# Patient Record
Sex: Male | Born: 1965
Health system: Southern US, Community
[De-identification: ages and names within clinical notes are randomized; demographics above are authoritative.]

## PROBLEM LIST (undated history)

## (undated) DIAGNOSIS — D509 Iron deficiency anemia, unspecified: Secondary | ICD-10-CM

## (undated) DIAGNOSIS — T7840XA Allergy, unspecified, initial encounter: Secondary | ICD-10-CM

## (undated) DIAGNOSIS — G4730 Sleep apnea, unspecified: Secondary | ICD-10-CM

## (undated) DIAGNOSIS — Z5189 Encounter for other specified aftercare: Secondary | ICD-10-CM

## (undated) DIAGNOSIS — R0600 Dyspnea, unspecified: Secondary | ICD-10-CM

## (undated) DIAGNOSIS — R0609 Other forms of dyspnea: Secondary | ICD-10-CM

## (undated) DIAGNOSIS — F329 Major depressive disorder, single episode, unspecified: Secondary | ICD-10-CM

## (undated) DIAGNOSIS — F988 Other specified behavioral and emotional disorders with onset usually occurring in childhood and adolescence: Secondary | ICD-10-CM

## (undated) DIAGNOSIS — K449 Diaphragmatic hernia without obstruction or gangrene: Secondary | ICD-10-CM

## (undated) DIAGNOSIS — F32A Depression, unspecified: Secondary | ICD-10-CM

## (undated) DIAGNOSIS — F419 Anxiety disorder, unspecified: Secondary | ICD-10-CM

## (undated) DIAGNOSIS — IMO0001 Reserved for inherently not codable concepts without codable children: Secondary | ICD-10-CM

## (undated) DIAGNOSIS — K219 Gastro-esophageal reflux disease without esophagitis: Secondary | ICD-10-CM

## (undated) DIAGNOSIS — G47419 Narcolepsy without cataplexy: Secondary | ICD-10-CM

## (undated) HISTORY — DX: Gastro-esophageal reflux disease without esophagitis: K21.9

## (undated) HISTORY — DX: Other forms of dyspnea: R06.09

## (undated) HISTORY — DX: Depression, unspecified: F32.A

## (undated) HISTORY — DX: Encounter for other specified aftercare: Z51.89

## (undated) HISTORY — DX: Anxiety disorder, unspecified: F41.9

## (undated) HISTORY — DX: Major depressive disorder, single episode, unspecified: F32.9

## (undated) HISTORY — PX: COLONOSCOPY: SHX174

## (undated) HISTORY — DX: Sleep apnea, unspecified: G47.30

## (undated) HISTORY — DX: Diaphragmatic hernia without obstruction or gangrene: K44.9

## (undated) HISTORY — DX: Allergy, unspecified, initial encounter: T78.40XA

## (undated) HISTORY — PX: UPPER GASTROINTESTINAL ENDOSCOPY: SHX188

## (undated) HISTORY — DX: Iron deficiency anemia, unspecified: D50.9

## (undated) HISTORY — DX: Other specified behavioral and emotional disorders with onset usually occurring in childhood and adolescence: F98.8

## (undated) HISTORY — DX: Narcolepsy without cataplexy: G47.419

## (undated) HISTORY — DX: Dyspnea, unspecified: R06.00

---

## 2008-03-08 ENCOUNTER — Ambulatory Visit: Payer: Self-pay | Admitting: Psychiatry

## 2008-03-08 ENCOUNTER — Inpatient Hospital Stay (HOSPITAL_COMMUNITY): Admission: AD | Admit: 2008-03-08 | Discharge: 2008-03-20 | Payer: Self-pay | Admitting: Psychiatry

## 2008-04-12 ENCOUNTER — Emergency Department (HOSPITAL_COMMUNITY): Admission: EM | Admit: 2008-04-12 | Discharge: 2008-04-13 | Payer: Self-pay | Admitting: Emergency Medicine

## 2008-06-26 ENCOUNTER — Ambulatory Visit: Payer: Self-pay | Admitting: *Deleted

## 2008-06-26 ENCOUNTER — Inpatient Hospital Stay (HOSPITAL_COMMUNITY): Admission: RE | Admit: 2008-06-26 | Discharge: 2008-07-13 | Payer: Self-pay | Admitting: *Deleted

## 2009-06-19 ENCOUNTER — Ambulatory Visit: Payer: Self-pay | Admitting: Internal Medicine

## 2009-06-24 ENCOUNTER — Ambulatory Visit: Payer: Self-pay | Admitting: *Deleted

## 2010-12-27 ENCOUNTER — Emergency Department (HOSPITAL_COMMUNITY): Payer: Self-pay

## 2010-12-27 ENCOUNTER — Inpatient Hospital Stay (HOSPITAL_COMMUNITY)
Admission: EM | Admit: 2010-12-27 | Discharge: 2010-12-28 | DRG: 812 | Disposition: A | Discharge status: Home / Self Care | Payer: Self-pay | Attending: Internal Medicine | Admitting: Internal Medicine

## 2010-12-27 DIAGNOSIS — D509 Iron deficiency anemia, unspecified: Principal | ICD-10-CM | POA: Diagnosis present

## 2010-12-27 DIAGNOSIS — R0609 Other forms of dyspnea: Secondary | ICD-10-CM | POA: Diagnosis present

## 2010-12-27 DIAGNOSIS — F329 Major depressive disorder, single episode, unspecified: Secondary | ICD-10-CM | POA: Diagnosis present

## 2010-12-27 DIAGNOSIS — R5381 Other malaise: Secondary | ICD-10-CM | POA: Diagnosis present

## 2010-12-27 DIAGNOSIS — R0989 Other specified symptoms and signs involving the circulatory and respiratory systems: Secondary | ICD-10-CM | POA: Diagnosis present

## 2010-12-27 DIAGNOSIS — F3289 Other specified depressive episodes: Secondary | ICD-10-CM | POA: Diagnosis present

## 2010-12-27 LAB — COMPREHENSIVE METABOLIC PANEL
ALT: 16 U/L (ref 0–53)
AST: 18 U/L (ref 0–37)
Alkaline Phosphatase: 67 U/L (ref 39–117)
CO2: 24 mEq/L (ref 19–32)
Calcium: 8.8 mg/dL (ref 8.4–10.5)
GFR calc Af Amer: >60 mL/min (ref >60)
GFR calc non Af Amer: >60 mL/min (ref >60)
Glucose, Bld: 96 mg/dL (ref 70–99)
Potassium: 4.6 mEq/L (ref 3.5–5.1)
Sodium: 135 mEq/L (ref 135–145)
Total Protein: 6.9 g/dL (ref 6.0–8.3)

## 2010-12-27 LAB — CBC
HCT: 20.5 % — ABNORMAL LOW (ref 39.0–52.0)
MCV: 61.6 fL — ABNORMAL LOW (ref 78.0–100.0)
RBC: 3.33 MIL/uL — ABNORMAL LOW (ref 4.22–5.81)
RDW: 17.2 % — ABNORMAL HIGH (ref 11.5–15.5)
WBC: 7.7 10*3/uL (ref 4.0–10.5)

## 2010-12-27 LAB — RETICULOCYTES
RBC.: 3.1 MIL/uL — ABNORMAL LOW (ref 4.22–5.81)
Retic Count, Absolute: 55.8 10*3/uL (ref 19.0–186.0)

## 2010-12-27 LAB — VITAMIN B12: Vitamin B-12: 464 pg/mL (ref 211–911)

## 2010-12-27 LAB — ABO/RH: ABO/RH(D): A POS

## 2010-12-28 ENCOUNTER — Inpatient Hospital Stay (HOSPITAL_COMMUNITY): Payer: Self-pay

## 2010-12-28 LAB — TYPE AND SCREEN

## 2010-12-28 LAB — CBC
MCV: 65.9 fL — ABNORMAL LOW (ref 78.0–100.0)
Platelets: 337 10*3/uL (ref 150–400)
RDW: 20.8 % — ABNORMAL HIGH (ref 11.5–15.5)
WBC: 7.1 10*3/uL (ref 4.0–10.5)

## 2010-12-28 LAB — IRON AND TIBC
Iron: <10 ug/dL — ABNORMAL LOW (ref 42–135)
UIBC: 406 ug/dL

## 2010-12-29 ENCOUNTER — Encounter: Payer: Self-pay | Admitting: Internal Medicine

## 2010-12-29 NOTE — Telephone Encounter (Signed)
I have contacted the home phone for this patient and it is a group home, and the patient is no longer a resident.  I have left a voicemail on the emergency contact number to please contact us.

## 2010-12-29 NOTE — Telephone Encounter (Signed)
Message copied by Darcey Nora on Mon Dec 29, 2010 11:27 AM ------      Message from: Stan Head      Created: Sun Dec 28, 2010 12:30 PM      Regarding: needs colonoscopy       I spoke to hospitalist      Iron-deficiency anemia, transfused and ok      No symptoms      Please arrange for colonoscopy direct with me re: [phone]

## 2010-12-29 NOTE — Telephone Encounter (Signed)
Message copied by Darcey Nora on Mon Dec 29, 2010 10:57 AM ------      Message from: Stan Head      Created: Sun Dec 28, 2010 12:30 PM      Regarding: needs colonoscopy       I spoke to hospitalist      Iron-deficiency anemia, transfused and ok      No symptoms      Please arrange for colonoscopy direct with me re: [phone]

## 2010-12-29 NOTE — Telephone Encounter (Signed)
I have attempted to reach the patient at the listed phone number- this is a group home and they state he is no longer a resident and have no forwarding information.  I have left a voicemail with the emergency contact number.

## 2010-12-29 NOTE — Telephone Encounter (Signed)
This encounter was created in error - please disregard.

## 2010-12-30 NOTE — Discharge Summary (Signed)
[name]               ACCOUNT NO.:  [id number]  MEDICAL RECORD NO.:  [id number]           PATIENT TYPE:  I  LOCATION:  5529                         FACILITY:  [facility]  PHYSICIAN:  Thad Ranger, MD       DATE OF BIRTH:  [birth year]  DATE OF ADMISSION:  12/27/2010 DATE OF DISCHARGE:  12/28/2010                        DISCHARGE SUMMARY - REFERRING   PRIMARY CARE PHYSICIAN:  Beverely Risen, MD  DISCHARGE DIAGNOSES: 1. Anemia, iron-deficiency. 2. Exertional dyspnea secondary to anemia. 3. History of depression. 4. Constipation.  DISCHARGE MEDICATIONS: 1. Ferrous sulfate 325 mg p.o. t.i.d. 2. Wellbutrin 100 mg 2 caps p.o. b.i.d. 3. Zoloft 50 mg p.o. b.i.d. 4. Senna/docusate 2 tablets p.o. at bedtime.  BRIEF HISTORY OF PRESENT ILLNESS:  [name] is a 45 year old male with no significant past medical history, who presented with a gradual onset of fatigue and dyspnea.  The patient stated that he had gotten worse over the past several months, so he went to the free clinic, and he was seen for initial evaluation and labs were drawn.  Hemoglobin was 6.3 with an undetectable ferritin level, and he was referred to the emergency room for further workup.  For details, please refer to the admission note dictated by Dr. Orvis Brill on 12/27/2010.  The patient otherwise denied any obvious blood loss, any hematemesis, hematuria, melena, or any hematochezia.  He stated that his bowel movements were normal.  He did not take any NSAIDs or any over-the-counter medications. No recent surgery, trauma, or any antibiotics use.  RADIOLOGICAL DATA:  Chest x-ray two-view April 21; mild right peribronchial thickening without focal airspace disease.  Abdominal x- ray April 21; no acute findings, moderate stool burden.  CT of abdomen and pelvis without contrast April 22 showed no evidence of retroperitoneal hemorrhage and right nephrolithiasis.  PERTINENT LABORATORY DIAGNOSTIC DATA:  CBC at the time of  admission had shown hemoglobin of 5.9 with hematocrit of 20.5.  Stool occult blood was negative.  CMP was essentially normal and unremarkable.  Reti count 1.8.  Smear showed ovalocyte, LDH 180, haptoglobin 198.  Anemia panel showed iron less than 10, ferritin 1, folate 7.1, B12 of 464.  CBC at the time of discharge had shown white count of 7.1, hemoglobin 7.7, hematocrit 25.7, platelets 337.  BRIEF HOSPITALIZATION COURSE:  [name] is a 45 year old male who presented with chronic anemia.  Anemia, likely iron-deficiency anemia.  The patient was admitted to the hospital and transfused 2 units of packed RBCs.  Fecal occult blood test was negative.  The patient had extensive workup which ruled out any hemolysis.  He has a significant iron-deficiency anemia with iron of less than 10 and ferritin of 1.  He has not had any screening colonoscopy so far.  He denies any hematochezia or melena.  I discussed in detail with Dr. Stan Head.  The patient does need a screening colonoscopy as well as capsule endoscopy if the colonoscopy is negative. The patient did not have any symptoms of IBD or celiac disease or any diarrhea.  I also spoke with hematology, Dr. Lucinda Dell over the phone, who recommended checking a  CT of abdomen and pelvis to rule out occult retroperitoneal hemorrhage.  Stat CT abdomen was done, which was negative for any retroperitoneal hemorrhage.  GI office will call the patient to schedule the colonoscopy next week.  The patient was discharged home on iron replacement.  At the time of the discharge, the patient was ambulating without any difficulty, and had no dyspnea on ambulation in the hallways.  DISCHARGE FOLLOWUP:  Dr. Stan Head next week.  The patient will be called with an appointment for colonoscopy as well.     Thad Ranger, MD     RR/MEDQ  D:  12/28/2010  T:  12/28/2010  Job:  [id number]  cc:   Iva Boop, MD,FACG Beverely Risen, MD  Electronically Signed  by Andres Labrum RAI  on 12/30/2010 05:36:15 PM

## 2011-01-01 NOTE — Telephone Encounter (Signed)
Unable to reach the patient or the emergency contact to schedule colon.

## 2011-01-16 NOTE — H&P (Signed)
[name], FOILES NO.:  [id number]  MEDICAL RECORD NO.:  [id number]           PATIENT TYPE:  E  LOCATION:  [facility]                         FACILITY:  [facility]  PHYSICIAN:  Seward Speck, MD        DATE OF BIRTH:  1966/02/07  DATE OF ADMISSION:  12/27/2010 DATE OF DISCHARGE:                             HISTORY & PHYSICAL   PRIMARY CARE PHYSICIAN:  The patient recently established care at the [facility], physician's name is Dr. Vickii Penna.  CHIEF COMPLAINT:  Fatigue and dyspnea.  HISTORY OF PRESENT ILLNESS:  The patient is a 45 year old male with no significant past medical history who presents with the above complaint the patient was in his usual state of health until several months ago, when he developed the gradual onset of fatigue and dyspnea.  This gradually has gotten worse over the past several months, because of this he went to the free clinic recently where he was seen for initial evaluation and had labs drawn.  These labs showed a hemoglobin of 6.3 with an almost undetectable ferritin level, and he was referred to ER for further evaluation.  Talking to the patient, he has no significant prior past medical history.  He has no history of any toxic exposures specifically no history of any chemotherapy or radiation exposure of any type of any type.  He does not have any chronic gastrointestinal, cardiovascular, or renal diseases.  He does have history of asthma which is very mild, it causes intermittent dyspnea and a dry cough but he states his recent dyspnea which has gotten gradually worse over the past several months is not does not feel like his asthma, dyspnea.  He is currently taking classes at the [employer] and states that he has noticed walking to his class, he has gotten very short of breath having to stop after walking several 1000 feet.  The patient states he eats a diet including a normal amount of red meat, he does not adhere to any vegan diet and  he has not had any dietary changes of recent.  He does not have any obvious blood loss that he can tell, specifically no cough, no hemoptysis.  No hematemesis, no hematuria, melena, no hematochezia.  His bowel movements have been normal.  He does not take any NSAIDs or other over-the-counter nonsteroidal medicines, he has never had any stomach ulcers or other gastrointestinal bleeding sources diagnosed in the past. He has not had any recent surgery or trauma.  PAST MEDICAL HISTORY: 1. Depression. 2. Asthma, mild intermittent.  PAST SURGICAL HISTORY:  None.  FAMILY HISTORY:  The patient does not know his family history, as he is not close with his family.  SOCIAL HISTORY:  The patient currently is taking classes at [employer].  He denies tobacco or alcohol use.  He is currently not working because he is taking classes.  ALLERGIES:  No known drug allergies.  MEDICATIONS: 1. Zoloft 100 mg daily. 2. Wellbutrin 200 mg twice daily.  No other over-the-counter supplements or herbal medications.  REVIEW OF SYSTEMS:  A 12-point review of systems was reviewed with the  patient and as stated in HPI, otherwise negative.  The patient additionally states that he feels like he might have gained weight recently and had a few night sweats but these do not sound that significant.  PHYSICAL EXAMINATION:  VITAL SIGNS:  Afebrile, blood pressure 118/76, pulse 90, respiratory rate 16, oxygen saturation 98% on room air. GENERAL/CONSTITUTIONAL:  Overweight male in no acute distress. HEENT:  Moist mucous membranes.  Sclerae are pale. NECK:  Supple.  No JVD.  No lymphadenopathy. CARDIOVASCULAR:  Regular rate and rhythm.  No murmurs. No S3, no S4. PULMONARY:  The patient has occasional expiratory wheezes with moderate air movement. ABDOMEN:  Soft, nontender, and nondistended. EXTREMITIES:  Warm and well-perfused.  No cyanosis, clubbing, or edema x4.  Normal pulses throughout. SKIN:  No rash. PSYCH:   Appropriate. NEUROLOGIC:  Alert and oriented x3. RECTAL:  The ER physician performed a rectal exam which per her description, there was no gross blood and guaiac negative.  DIAGNOSTIC STUDIES:  I have labs done from December 16, 2010, by his primary care physician which I will summarize as follows:  These were acquired on December 16, 2010.  Hemoglobin 6.3, hematocrit 22.0, MCV, MCH, MCHC although RDW high, platelet count at 423, white blood cell differential normal.  BMP normal.  Thyroxine normal, vitamin B12 normal, folic acid normal.  LFT panel, hepatic panel normal, serum ferritin 2 with a normal range and this lab being 30 to 400.  EKG today shows normal sinus rhythm with no abnormalities.  Chest x-ray is grossly normal.  LABORATORY DATA:  Labs today in the ER shows hemoglobin of 5.9 MCV, MCH, MCHC although RDW high platelet count normal, white blood cell count normal.  His reticulocyte production index is approximately 0.5 which is low, and his BMP and hepatic panel was normal.  ASSESSMENT: 1. Anemia, iron deficient, etiology unknown at this time. 2. History of depression. 3. Fatigue and dyspnea likely due to anemia. PLAN:  Two units of packed red blood cells have been ordered for the patient.  This will obviously make him feel better in the short run, I will place him on oral iron replacement therapy after this.  I think if he gets the blood and ambulates tomorrow and feels better, he can probably be discharged.  However, I think it will be important to scheduled followup outpatient endoscopy in the near future.  I do not know why he is iron-deficient, it sounds like his dietary intake has not changed and eats an adequate amount of iron from what I can like, it is possible he is not absorbing this, although he has no known history of celiac disease or IBD and no symptoms at that while talking to him, however, this is certainly a possibility.  Obviously a slow GI bleed will be the  most common etiology and certainly needs to be ruled out in this gentleman which can likely be done safely in the outpatient setting.  We will continue his antidepressants while he is here.  I will also check lysis labs and blood just to round up the workup for his anemia.  For prophylaxis, we will simply rely on ambulation as I think he is at low risk for DVT.  We will also recheck his CBC in the morning.          ______________________________ Seward Speck, MD     DP/MEDQ  D:  12/27/2010  T:  12/27/2010  Job:  [id number]  Electronically Signed by Seward Speck MD on  01/16/2011 09:36:44 PM
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
## 2011-02-03 ENCOUNTER — Ambulatory Visit: Payer: Self-pay | Admitting: Gastroenterology

## 2011-02-09 ENCOUNTER — Ambulatory Visit: Payer: Self-pay | Admitting: Internal Medicine

## 2011-06-04 LAB — COMPREHENSIVE METABOLIC PANEL
ALT: 25
Alkaline Phosphatase: 81
Chloride: 105
Glucose, Bld: 104 — ABNORMAL HIGH
Potassium: 3.9
Sodium: 140
Total Protein: 6.3

## 2011-06-04 LAB — SEX HORMONE BINDING GLOBULIN: Sex Hormone Binding: 31 nmol/L (ref 13–71)

## 2011-06-04 LAB — CBC
Hemoglobin: 13.2
RBC: 4.5
RDW: 12
WBC: 6.6

## 2011-06-04 LAB — TSH: TSH: 1.702 (ref 0.350–4.500)

## 2011-06-04 LAB — TESTOSTERONE, FREE: Testosterone, Free: 75.5 pg/mL (ref 47.0–244.0)

## 2011-06-04 LAB — TESTOSTERONE, % FREE: Testosterone-% Free: 2.1 — ABNORMAL HIGH (ref 1.6–2.9)

## 2011-06-05 LAB — BASIC METABOLIC PANEL
BUN: 11
Calcium: 9.5
Chloride: 105
Creatinine, Ser: 1.08
GFR calc Af Amer: >60
GFR calc non Af Amer: >60

## 2011-06-05 LAB — URINALYSIS, ROUTINE W REFLEX MICROSCOPIC
Hgb urine dipstick: NEGATIVE
Nitrite: NEGATIVE
Specific Gravity, Urine: 1.014
Urobilinogen, UA: 1

## 2011-06-05 LAB — DIFFERENTIAL
Lymphocytes Relative: 23
Lymphs Abs: 1.7
Monocytes Relative: 11
Neutro Abs: 4.5
Neutrophils Relative %: 61

## 2011-06-05 LAB — CBC
Platelets: 279
RBC: 4.84
WBC: 7.4

## 2011-06-05 LAB — RAPID URINE DRUG SCREEN, HOSP PERFORMED
Amphetamines: NOT DETECTED
Barbiturates: NOT DETECTED

## 2011-06-05 LAB — ETHANOL: Alcohol, Ethyl (B): <5

## 2011-06-09 LAB — CBC
Hemoglobin: 13.4
MCHC: 33.7
MCV: 85.5
RBC: 4.64
WBC: 6.3

## 2011-06-09 LAB — COMPREHENSIVE METABOLIC PANEL
ALT: 27
AST: 26
CO2: 31
Calcium: 9.8
Chloride: 102
Creatinine, Ser: 1.07
GFR calc Af Amer: >60
GFR calc non Af Amer: >60
Glucose, Bld: 82
Sodium: 138
Total Bilirubin: 0.6

## 2011-06-13 ENCOUNTER — Inpatient Hospital Stay (INDEPENDENT_AMBULATORY_CARE_PROVIDER_SITE_OTHER)
Admission: RE | Admit: 2011-06-13 | Discharge: 2011-06-13 | Disposition: A | Discharge status: Home / Self Care | Payer: Self-pay | Source: Ambulatory Visit | Attending: Emergency Medicine | Admitting: Emergency Medicine

## 2011-06-13 DIAGNOSIS — D649 Anemia, unspecified: Secondary | ICD-10-CM

## 2011-06-13 LAB — CBC
HCT: 31.4 % — ABNORMAL LOW (ref 39.0–52.0)
Hemoglobin: 9.4 g/dL — ABNORMAL LOW (ref 13.0–17.0)
MCHC: 29.9 g/dL — ABNORMAL LOW (ref 30.0–36.0)
RBC: 4.63 MIL/uL (ref 4.22–5.81)
WBC: 9.1 10*3/uL (ref 4.0–10.5)

## 2011-06-13 LAB — POCT I-STAT, CHEM 8
HCT: 34 % — ABNORMAL LOW (ref 39.0–52.0)
Hemoglobin: 11.6 g/dL — ABNORMAL LOW (ref 13.0–17.0)
Potassium: 4.5 mEq/L (ref 3.5–5.1)
Sodium: 138 mEq/L (ref 135–145)
TCO2: 25 mmol/L (ref 0–100)

## 2011-06-13 LAB — DIFFERENTIAL
Basophils Absolute: 0 10*3/uL (ref 0.0–0.1)
Basophils Relative: 0 % (ref 0–1)
Lymphocytes Relative: 17 % (ref 12–46)
Neutro Abs: 6.2 10*3/uL (ref 1.7–7.7)
Neutrophils Relative %: 68 % (ref 43–77)

## 2011-06-13 LAB — POCT URINALYSIS DIP (DEVICE)
Glucose, UA: NEGATIVE mg/dL
Leukocytes, UA: NEGATIVE
Protein, ur: NEGATIVE mg/dL
Specific Gravity, Urine: 1.025 (ref 1.005–1.030)
Urobilinogen, UA: 0.2 mg/dL (ref 0.0–1.0)

## 2011-12-13 ENCOUNTER — Encounter (HOSPITAL_COMMUNITY): Payer: Self-pay

## 2011-12-13 ENCOUNTER — Emergency Department (HOSPITAL_COMMUNITY)
Admission: EM | Admit: 2011-12-13 | Discharge: 2011-12-13 | Disposition: A | Discharge status: Home / Self Care | Payer: Self-pay | Source: Home / Self Care | Attending: Emergency Medicine | Admitting: Emergency Medicine

## 2011-12-13 DIAGNOSIS — D649 Anemia, unspecified: Secondary | ICD-10-CM

## 2011-12-13 DIAGNOSIS — R5383 Other fatigue: Secondary | ICD-10-CM

## 2011-12-13 DIAGNOSIS — R5381 Other malaise: Secondary | ICD-10-CM

## 2011-12-13 HISTORY — DX: Encounter for other specified aftercare: Z51.89

## 2011-12-13 HISTORY — DX: Reserved for inherently not codable concepts without codable children: IMO0001

## 2011-12-13 LAB — POCT I-STAT, CHEM 8
BUN: 17 mg/dL (ref 6–23)
Chloride: 105 mEq/L (ref 96–112)
Glucose, Bld: 90 mg/dL (ref 70–99)
HCT: 28 % — ABNORMAL LOW (ref 39.0–52.0)
Potassium: 4.3 mEq/L (ref 3.5–5.1)

## 2011-12-13 LAB — CBC
HCT: 27.2 % — ABNORMAL LOW (ref 39.0–52.0)
Hemoglobin: 7.5 g/dL — ABNORMAL LOW (ref 13.0–17.0)
RBC: 4.51 MIL/uL (ref 4.22–5.81)
RDW: 17.8 % — ABNORMAL HIGH (ref 11.5–15.5)
WBC: 5.6 10*3/uL (ref 4.0–10.5)

## 2011-12-13 LAB — TSH: TSH: 1.396 u[IU]/mL (ref 0.350–4.500)

## 2011-12-13 MED ORDER — VITAMIN C 500 MG PO TABS: 500.0000 mg | ORAL_TABLET | Freq: Every day | ORAL | Status: AC

## 2011-12-13 MED ORDER — FOLIC ACID 1 MG PO TABS: 1.0000 mg | ORAL_TABLET | Freq: Every day | ORAL | Status: AC

## 2011-12-13 MED ORDER — FERROUS SULFATE 325 (65 FE) MG PO TABS: 325.0000 mg | ORAL_TABLET | Freq: Three times a day (TID) | ORAL | Status: DC

## 2011-12-13 NOTE — ED Notes (Signed)
Pt states a week ago has some minor cold symptoms states main reason for visit today is extreme fatigue

## 2011-12-13 NOTE — ED Notes (Signed)
Dr Ladon Applebaum in room informing pt of lab results and text that he should have as an outpatient. Pt given health screening information and told to come back on Friday for this. Pt states when given rx and discharge instructions " So he is just giving me vitamins. " informed of value of meds and anemia, pt walked out.

## 2011-12-13 NOTE — ED Provider Notes (Signed)
History     CSN: [id number]  Arrival date & time 12/13/11  0945   First MD Initiated Contact with Patient 12/13/11 [phone]      Chief Complaint  Patient presents with  . Fatigue    extreme fatigue    (Consider location/radiation/quality/duration/timing/severity/associated sxs/prior treatment) HPI Comments: Patient presents urgent care today complaining of extreme fatigue and tiredness has been unable to get up his bed in the mornings. "At times it feels like the bed was on fire he would not have the energy to stand up and leave, I would burn in it". Patient also relates that he recently had a cold or congestion mild cough. Patient is not short of breath and describes the symptom as basically feeling lifeless.  "I do partially believe that some eye symptoms be related to depression something else is going on"  Patient denies any chest pains, palpitations, shortness of breath, headaches.  The history is provided by the patient.    Past Medical History  Diagnosis Date  . Anemia, iron deficiency   . Exertional dyspnea     secondary to anemia  . Depression   . Constipation   . ADD (attention deficit disorder)   . Hiatal hernia   . Depression   . Blood transfusion     History reviewed. No pertinent past surgical history.  No family history on file.  History  Substance Use Topics  . Smoking status: Never Smoker   . Smokeless tobacco: Not on file  . Alcohol Use: No      Review of Systems  Constitutional: Positive for activity change, appetite change and fatigue. Negative for fever and unexpected weight change.  HENT: Positive for congestion. Negative for neck stiffness.   Respiratory: Negative for chest tightness and shortness of breath.   Cardiovascular: Negative for chest pain, palpitations and leg swelling.  Gastrointestinal: Negative for abdominal distention.  Genitourinary: Negative for dysuria and flank pain.  Skin: Negative.  Negative for rash.  Neurological:  Negative for dizziness, tremors, seizures, syncope, speech difficulty, weakness, light-headedness, numbness and headaches.  Psychiatric/Behavioral: Negative for confusion and agitation.    Allergies  Review of patient's allergies indicates no known allergies.  Home Medications   Current Outpatient Rx  Name Route Sig Dispense Refill  . BUPROPION HCL 100 MG PO TABS Oral Take 200 mg by mouth 2 (two) times daily.      . METHYLPHENIDATE HCL 20 MG PO TABS Oral Take 20 mg by mouth 2 (two) times daily.    . SERTRALINE HCL 50 MG PO TABS Oral Take 50 mg by mouth 2 (two) times daily.      Marland Kitchen FERROUS SULFATE 325 (65 FE) MG PO TABS Oral Take 325 mg by mouth 3 (three) times daily.      . SENNA PO TABS Oral Take 2 tablets by mouth at bedtime.        BP 126/80  Pulse 95  Temp(Src) 98.8 F (37.1 C) (Oral)  Resp 16  SpO2 99%  Physical Exam  Nursing note and vitals reviewed. Constitutional: He is oriented to person, place, and time. He appears well-developed and well-nourished.  HENT:  Head: Normocephalic.  Mouth/Throat: No oropharyngeal exudate.  Eyes: Conjunctivae and EOM are normal. Pupils are equal, round, and reactive to light.  Neck: Neck supple. No JVD present. Normal range of motion present. No thyromegaly present.  Cardiovascular: Normal rate.  PMI is not displaced.   Pulmonary/Chest: Effort normal. No respiratory distress. He has no decreased breath sounds.  He has no wheezes. He has no rhonchi. He has no rales. He exhibits no tenderness.  Abdominal: Soft.  Musculoskeletal: Normal range of motion.  Lymphadenopathy:    He has no cervical adenopathy.  Neurological: He is alert and oriented to person, place, and time. No cranial nerve deficit or sensory deficit. He exhibits normal muscle tone. Coordination normal.  Skin: Skin is warm. No rash noted. No erythema.    ED Course  Procedures (including critical care time)   Labs Reviewed  TSH  CBC   No results found.   No diagnosis  found.    MDM  Patient with marked irritability attempted to leave 2 times as he was walk out provider encounter. Patient has an unremarkable exam and stable normal vital signs. Articulate and well presented and lab rates a history of extreme fatigue endocrinologist that depression might be a component of patient sees a psychiatrist at the [facility]. We are screening for metabolic disorders including hyperglycemia, electrolyte disbalance is in thyroid function have explained to patient that we are trying to find a organic metabolic source of his symptoms and that he needs to followup with primary care Dr. if symptoms were to persist and as well to consult with his behavioral provider as adjustments to his medications might be needed as he's: Through some stressful events in his: Dimas Alexandria, MD 12/13/11 1139

## 2011-12-13 NOTE — ED Notes (Signed)
Patient came out of exam room requesting to wait in waiting area until MD was ready to see him.  Both Engineer, manufacturing systems and I explained to him that he was next to be seen and it should only be a matter of moments before Dr Ladon Applebaum was with him

## 2011-12-13 NOTE — ED Notes (Addendum)
Per registration staff patient was in waiting room and wanted to leave because the nurses were talking. While talking with registration staff Dr Ladon Applebaum went into exam room to perform his physical exam and patient was not there. I went to waiting area to have patient come back to exam room. When patient reached exam room Dr Ladon Applebaum had left exam room. Patient very angry and stated that MD was not ready to see him. Dr Ladon Applebaum made aware patient was back in room. Dr Ladon Applebaum walked from office to exam room to perform his exam

## 2011-12-13 NOTE — Discharge Instructions (Signed)
You have anemia and have recommended along with treatment you followup with a primary care doctor and also the gastroenterologist in the near future as to rule out other conditions that might be behind your anemia. In the meantime start with these medicines as prescribed and think of a regimen that will probably take about 3 months. If worsening symptoms or you are able to use any blood in your stools or feeling worse despite improvement in should go to the emergency department as your hemoglobin could have decreased further and will be in need of other interventions  We discussed your current symptoms and your physical exam findings along with some of the preliminary lab workup we have done here today. We will contact you only if abnormal lab results as we will be waiting for your thyroid function tests. Have recommended you consider talking to your provider as implicitly in your symptoms is a significant stress level. This would be best managed further with your behavioral provider. We also discussed symptoms or changes that should immediately prompted you to seek immediate emergency medical attention in the [facility] long emergency department. I also have encouraged you to go to the community network screening process in order to establish care with primary care doctor is or other screening and health care maintenance recommendations that you should attend to in the near future.   Fatigue Fatigue is a feeling of tiredness, lack of energy, lack of motivation, or feeling tired all the time. Having enough rest, good nutrition, and reducing stress will normally reduce fatigue. Consult your caregiver if it persists. The nature of your fatigue will help your caregiver to find out its cause. The treatment is based on the cause.  CAUSES  There are many causes for fatigue. Most of the time, fatigue can be traced to one or more of your habits or routines. Most causes fit into one or more of three general areas. They  are: Lifestyle problems  Sleep disturbances.   Overwork.   Physical exertion.   Unhealthy habits.   Poor eating habits or eating disorders.   Alcohol and/or drug use .   Lack of proper nutrition (malnutrition).  Psychological problems  Stress and/or anxiety problems.   Depression.   Grief.   Boredom.  Medical Problems or Conditions  Anemia.   Pregnancy.   Thyroid gland problems.   Recovery from major surgery.   Continuous pain.   Emphysema or asthma that is not well controlled   Allergic conditions.   Diabetes.   Infections (such as mononucleosis).   Obesity.   Sleep disorders, such as sleep apnea.   Heart failure or other heart-related problems.   Cancer.   Kidney disease.   Liver disease.   Effects of certain medicines such as antihistamines, cough and cold remedies, prescription pain medicines, heart and blood pressure medicines, drugs used for treatment of cancer, and some antidepressants.  SYMPTOMS  The symptoms of fatigue include:   Lack of energy.   Lack of drive (motivation).   Drowsiness.   Feeling of indifference to the surroundings.  DIAGNOSIS  The details of how you feel help guide your caregiver in finding out what is causing the fatigue. You will be asked about your present and past health condition. It is important to review all medicines that you take, including prescription and non-prescription items. A thorough exam will be done. You will be questioned about your feelings, habits, and normal lifestyle. Your caregiver may suggest blood tests, urine tests, or other tests  to look for common medical causes of fatigue.  TREATMENT  Fatigue is treated by correcting the underlying cause. For example, if you have continuous pain or depression, treating these causes will improve how you feel. Similarly, adjusting the dose of certain medicines will help in reducing fatigue.  HOME CARE INSTRUCTIONS   Try to get the required amount of  good sleep every night.   Eat a healthy and nutritious diet, and drink enough water throughout the day.   Practice ways of relaxing (including yoga or meditation).   Exercise regularly.   Make plans to change situations that cause stress. Act on those plans so that stresses decrease over time. Keep your work and personal routine reasonable.   Avoid street drugs and minimize use of alcohol.   Start taking a daily multivitamin after consulting your caregiver.  SEEK MEDICAL CARE IF:   You have persistent tiredness, which cannot be accounted for.   You have fever.   You have unintentional weight loss.   You have headaches.   You have disturbed sleep throughout the night.   You are feeling sad.   You have constipation.   You have dry skin.   You have gained weight.   You are taking any new or different medicines that you suspect are causing fatigue.   You are unable to sleep at night.   You develop any unusual swelling of your legs or other parts of your body.  SEEK IMMEDIATE MEDICAL CARE IF:   You are feeling confused.   Your vision is blurred.   You feel faint or pass out.   You develop severe headache.   You develop severe abdominal, pelvic, or back pain.   You develop chest pain, shortness of breath, or an irregular or fast heartbeat.   You are unable to pass a normal amount of urine.   You develop abnormal bleeding such as bleeding from the rectum or you vomit blood.   You have thoughts about harming yourself or committing suicide.   You are worried that you might harm someone else.  MAKE SURE YOU:   Understand these instructions.   Will watch your condition.   Will get help right away if you are not doing well or get worse.  Document Released: 06/21/2007 Document Revised: 08/13/2011 Document Reviewed: 06/21/2007 [facility] Patient Information 2012 [place], [place].Anemia, Nonspecific Your exam and blood tests show you are anemic. This means your blood  (hemoglobin) level is low. Normal hemoglobin values are 12 to 15 g/dL for females and 14 to 17 g/dL for males. Make a note of your hemoglobin level today. The hematocrit percent is also used to measure anemia. A normal hematocrit is 38% to 46% in females and 42% to 49% in males. Make a note of your hematocrit level today. CAUSES  Anemia can be due to many different causes.  Excessive bleeding from periods (in women).   Intestinal bleeding.   Poor nutrition.   Kidney, thyroid, liver, and bone marrow diseases.  SYMPTOMS  Anemia can come on suddenly (acute). It can also come on slowly. Symptoms can include:  Minor weakness.   Dizziness.   Palpitations.   Shortness of breath.  Symptoms may be absent until half your hemoglobin is missing if it comes on slowly. Anemia due to acute blood loss from an injury or internal bleeding may require blood transfusion if the loss is severe. Hospital care is needed if you are anemic and there is significant continual blood loss. TREATMENT  Stool tests for blood (Hemoccult) and additional lab tests are often needed. This determines the best treatment.   Further checking on your condition and your response to treatment is very important. It often takes many weeks to correct anemia.  Depending on the cause, treatment can include:  Supplements of iron.   Vitamins B12 and folic acid.   Hormone medicines.If your anemia is due to bleeding, finding the cause of the blood loss is very important. This will help avoid further problems.  SEEK IMMEDIATE MEDICAL CARE IF:   You develop fainting, extreme weakness, shortness of breath, or chest pain.   You develop heavy vaginal bleeding.   You develop bloody or black, tarry stools or vomit up blood.   You develop a high fever, rash, repeated vomiting, or dehydration.  Document Released: 10/01/2004 Document Revised: 08/13/2011 Document Reviewed: 07/09/2009 [facility] Patient Information 2012 [place], [place].

## 2011-12-17 ENCOUNTER — Telehealth (HOSPITAL_COMMUNITY): Payer: Self-pay | Admitting: *Deleted

## 2011-12-17 NOTE — ED Notes (Signed)
Pt called and instructed to go to the [facility] to pick up a new patient packet and to turn the packet in on May 1st.  Pt informed that once accepted by the [facility] as a patient, they will assist him in receiving an orange card.  Made patient aware that we will fax his records from [facility] to the [facility] and instructed to call back with any questions.

## 2011-12-18 ENCOUNTER — Telehealth (HOSPITAL_COMMUNITY): Payer: Self-pay | Admitting: *Deleted

## 2011-12-18 NOTE — ED Notes (Signed)
1316 Pt. called back and said he went to the [facility] today and they would not give him a packet until 5/1.  I told pt. I was sorry I gave him incorrect information. I told him would call back after I talk to [facility].  I called [facility] @ 1330 and left a message.  1600 Tia called back and said the pt. is supposed to call on the first day of the month and they will mail a packet to them, they fill it out and either bring it back or mail it back, then they call the pt. for an appointment. I told her, I had faxed pt.'s record over as a referral from Dr. Ladon Applebaum yesterday.  She said they got the referral request and chart and [facility] sent the patient a packet in the mail today. 1820 I called pt. back and left a message with this information and apologized that I misunderstood the process and he made an unnecessary trip to the [facility]. I told him he should get his packet Mon. or Tues and call me if he has any questions. Vassie Moselle 12/18/2011

## 2012-01-07 ENCOUNTER — Ambulatory Visit (INDEPENDENT_AMBULATORY_CARE_PROVIDER_SITE_OTHER): Payer: Self-pay | Admitting: Family Medicine

## 2012-01-07 ENCOUNTER — Encounter: Payer: Self-pay | Admitting: Family Medicine

## 2012-01-07 VITALS — BP 135/89 | HR 104 | Temp 98.1°F | Ht 70.0 in | Wt 247.0 lb

## 2012-01-07 DIAGNOSIS — D649 Anemia, unspecified: Secondary | ICD-10-CM | POA: Insufficient documentation

## 2012-01-07 DIAGNOSIS — K219 Gastro-esophageal reflux disease without esophagitis: Secondary | ICD-10-CM

## 2012-01-07 DIAGNOSIS — J4521 Mild intermittent asthma with (acute) exacerbation: Secondary | ICD-10-CM | POA: Insufficient documentation

## 2012-01-07 DIAGNOSIS — R062 Wheezing: Secondary | ICD-10-CM

## 2012-01-07 DIAGNOSIS — F329 Major depressive disorder, single episode, unspecified: Secondary | ICD-10-CM

## 2012-01-07 DIAGNOSIS — J45909 Unspecified asthma, uncomplicated: Secondary | ICD-10-CM

## 2012-01-07 LAB — POCT HEMOGLOBIN: Hemoglobin: 6.4 g/dL — AB (ref 14.1–18.1)

## 2012-01-07 LAB — CBC WITH DIFFERENTIAL/PLATELET
Eosinophils Relative: 13 % — ABNORMAL HIGH (ref 0–5)
Hemoglobin: 6.7 g/dL — CL (ref 13.0–17.0)
Lymphocytes Relative: 22 % (ref 12–46)
Lymphs Abs: 1.9 10*3/uL (ref 0.7–4.0)
Monocytes Absolute: 0.7 10*3/uL (ref 0.1–1.0)
Platelets: 463 10*3/uL — ABNORMAL HIGH (ref 150–400)

## 2012-01-07 LAB — ANEMIA PANEL
ABS Retic: 71.1 10*3/uL (ref 19.0–186.0)
Iron: <10 ug/dL — ABNORMAL LOW (ref 42–165)
RBC.: 4.18 MIL/uL — ABNORMAL LOW (ref 4.22–5.81)

## 2012-01-07 LAB — COMPREHENSIVE METABOLIC PANEL
Albumin: 4.3 g/dL (ref 3.5–5.2)
CO2: 23 mEq/L (ref 19–32)
Calcium: 9 mg/dL (ref 8.4–10.5)
Chloride: 104 mEq/L (ref 96–112)
Glucose, Bld: 94 mg/dL (ref 70–99)
Sodium: 137 mEq/L (ref 135–145)
Total Bilirubin: 0.2 mg/dL — ABNORMAL LOW (ref 0.3–1.2)
Total Protein: 6.7 g/dL (ref 6.0–8.3)

## 2012-01-07 MED ORDER — OMEPRAZOLE 20 MG PO CPDR: 20.0000 mg | DELAYED_RELEASE_CAPSULE | Freq: Every day | ORAL | Status: DC

## 2012-01-07 MED ORDER — PREDNISONE 50 MG PO TABS: ORAL_TABLET | ORAL | Status: DC

## 2012-01-07 MED ORDER — ALBUTEROL SULFATE (2.5 MG/3ML) 0.083% IN NEBU
2.5000 mg | INHALATION_SOLUTION | Freq: Once | RESPIRATORY_TRACT | Status: AC
  Administered 2012-01-07: 2.5 mg via RESPIRATORY_TRACT

## 2012-01-07 MED ORDER — IPRATROPIUM BROMIDE 0.02 % IN SOLN
0.5000 mg | Freq: Once | RESPIRATORY_TRACT | Status: AC
  Administered 2012-01-07: 0.5 mg via RESPIRATORY_TRACT

## 2012-01-07 NOTE — Assessment & Plan Note (Signed)
Wheezing today (symptomatic for 3 weeks), no sig improvement after neb.  Dyspnea likely partially due to anemia.  Pulse ox normal with normal WOB.  Afebrile  Will treat as asthma exacerbation with prednisone x 5 days,  Advised to follow-up if no improvement or worsens, would consider CXR.

## 2012-01-07 NOTE — Progress Notes (Signed)
  Subjective:    Patient ID: [name], male    DOB: [birth year], 46 y.o.   MRN: [id number]  HPI New patient here to establish care  Anemia:  Found out 1 year ago, CBC was in the 5's, got 2 units of blood, no source found on admission.  Last week, went to Urgent Care for "lifelessness" described as extreme fatigue, dyspnea, heart pounding when active.  Head is cloudy.  Gave Rx for iron pills and vitamin C.  Has been taking iron once a day since then, but had also been taking daily for the past year.  Reports possible melanotic episode last week, now resolved.  Abdominal Pain:  When swallowing, feels like something gets stuck in his chest.  Sour taste when laying down at night.  No food triggers.  Occasional Tums helps. Review of Systems .Patient Information Form: Screening and ROS  AUDIT-C Score: 0 Do you feel safe in relationships? yes PHQ-2:positive  Review of Symptoms  General:  Negative for nexplained weight loss, fever Skin: Negative for new or changing mole, sore that won't heal HEENT: Negative for trouble hearing, trouble seeing, ringing in ears, mouth sores, hoarseness, change in voice, dysphagia. CV:  Negative for chest pain, edema Resp: Negative for cough, hemoptysis GI: Negative for nausea, vomiting, diarrhea, constipation, abdominal pain, melena, hematochezia. GU: Negative for dysuria, incontinence, urinary hesitance, hematuria, vaginal or penile discharge, polyuria, sexual difficulty, lumps in testicle or breasts MSK: Negative for muscle cramps or aches, joint pain or swelling Neuro: Negative for headaches, weakness, numbness, dizziness, passing out/fainting Psych: Positivefor depression, anxiety, memory problems     Positive for dyspnea, fast heartbeat. Epigastric pain, weakness, sadness, anxiety, stress, poor concentration. Objective:   Physical Exam GEN: Alert & Oriented, No acute distress, pale HEENT: [place]/AT. EOMI, PERRLA, no conjunctival injection or scleral  icterus.  Bilateral tympanic membranes intact without erythema or effusion.  .  Nares without edema or rhinorrhea.  Oropharynx is without erythema or exudates.  No anterior or posterior cervical lymphadenopathy. CV:  Regular Rate & Rhythm, no murmur Respiratory:  Normal work of breathing, CTAB Abd:  + BS, soft, no tenderness to palpation Ext: no pre-tibial edema Psych: flat affect.  Alert and orieneted.  Memory intact       Assessment & Plan:

## 2012-01-07 NOTE — Patient Instructions (Signed)
Increase iron to twice a day Start taking omeprazole to help with acid reflux symptoms Will refer you to GI doctor Follow-up in 3-4 weeks or sooner if needed

## 2012-01-07 NOTE — Assessment & Plan Note (Signed)
omeprazole

## 2012-01-07 NOTE — Assessment & Plan Note (Addendum)
Chronic Iron deficiency anemia with no active source of blood loss.  Had CT abdomen 1 year ago, normal except for right nephrolithiasis, currently asymptomatic.  Hemoccult at that hospitalization was neg.  No obvious source identified.  Possible upper GI with GERD symptoms?  Family history unknown.  Will check CBC with diff, peripheral smear, anemia panel, CMET today.  Unable to get U/A, patient declined hemoccult.  Will refer to tertiary care center for GI evaluation- unable to refer locally as he is uninsured.  Will increase iron to twice a day and trial of PPI.  Update:  CBC, peripheral smear consistent with iron deficiency anemia  If unable to get timely consult, may consider other imaging studies to help determine source of bleed/malignancy.

## 2012-01-08 ENCOUNTER — Telehealth: Payer: Self-pay | Admitting: Family Medicine

## 2012-01-08 LAB — PATHOLOGIST SMEAR REVIEW

## 2012-01-08 NOTE — Progress Notes (Signed)
Patient ID: [name], male   DOB: [birth year], 46 y.o.   MRN: [id number]  [facility] called with critical value Hemoglobin 6.7.  MCV 60 and Iron < 10.  This may be secondary to chronic iron deficiency.  No active source of bleed per last office visit note, however patient declined Hemoccult.  Will call patient in AM to discuss lab values and possible admission for transfusion, anemia work up.

## 2012-01-08 NOTE — Telephone Encounter (Signed)
Spoke to PCP, Dr. Earnest Bailey, about patient's Hgb 6.7 (critical lab value that was called to resident on call overnight).  PCP will inform patient and arrange follow up as needed.

## 2012-01-14 ENCOUNTER — Telehealth: Payer: Self-pay | Admitting: Family Medicine

## 2012-01-14 MED ORDER — PREDNISONE 50 MG PO TABS: ORAL_TABLET | ORAL | Status: AC

## 2012-01-14 NOTE — Telephone Encounter (Signed)
Please let patient know I have sent in 5 more days, if not improved, please return for follow-up

## 2012-01-14 NOTE — Telephone Encounter (Signed)
Pt is asking to have the prednisone extended for a few more days - it is working, but feels like he could use a few more days.  [facility]- [address]

## 2012-01-26 ENCOUNTER — Ambulatory Visit (INDEPENDENT_AMBULATORY_CARE_PROVIDER_SITE_OTHER): Payer: Self-pay | Admitting: Family Medicine

## 2012-01-26 DIAGNOSIS — J45909 Unspecified asthma, uncomplicated: Secondary | ICD-10-CM

## 2012-01-26 MED ORDER — PREDNISONE 10 MG PO TABS: ORAL_TABLET | ORAL | Status: DC

## 2012-01-26 MED ORDER — IPRATROPIUM BROMIDE 0.02 % IN SOLN
0.5000 mg | Freq: Once | RESPIRATORY_TRACT | Status: AC
  Administered 2012-01-26: 0.5 mg via RESPIRATORY_TRACT

## 2012-01-26 MED ORDER — ALBUTEROL SULFATE (2.5 MG/3ML) 0.083% IN NEBU
2.5000 mg | INHALATION_SOLUTION | Freq: Once | RESPIRATORY_TRACT | Status: AC
  Administered 2012-01-26: 2.5 mg via RESPIRATORY_TRACT

## 2012-01-26 NOTE — Patient Instructions (Signed)
Thank you for coming in today, it was nice to meet you I would like for you to get your chest x ray and try the steroid taper. For the next 2 days be sure to use your albuterol every 4-6 hours even if you feel like you do not need it. Follow up with Dr. Earnest Bailey in the next couple of weeks, hopefully you will have met with Jaynee Eagles by that time and can discuss adding an inhaled steroid.

## 2012-01-28 NOTE — Progress Notes (Signed)
  Subjective:    Patient ID: WAI LITT, male    DOB: 30-Apr-1966, 46 y.o.   MRN: [id number]  HPI Shortness of breath:  Here with complaint of shortness of breath.  Started one day ago, but also had this earlier in the month.  Feels like shortness of breath is worse today.  Has history of asthma, currently only using albuterol.  Was on steroid burst earlier in the month and this helped signficantly.  Has never been on inhaled corticosteroid.  He has had mild cough and wheezing but denies chest pain, fever, nausea or vomiting, lightheadedness/dizziness.    Review of Systems     Objective:   Physical Exam  Constitutional: He is oriented to person, place, and time. He appears well-nourished. No distress.  HENT:  Mouth/Throat: Oropharynx is clear and moist.  Neck: Neck supple.  Cardiovascular: Normal rate, regular rhythm and normal heart sounds.   Pulmonary/Chest: Effort normal. He has wheezes (diffuse, bilaterally).  Musculoskeletal: He exhibits no edema.       LE without swelling or tenderness   Neurological: He is alert and oriented to person, place, and time.          Assessment & Plan:

## 2012-01-28 NOTE — Assessment & Plan Note (Signed)
Wheezing on exam, given albuterol neb in office with improvement of wheezing and subjective improvement in symptoms.  Anemia may be a factor for subjective dyspnea.  I explained to him with symptoms returning so soon after recent steroids I would like for him to have a chest x-ray.  I will go ahead and place him on longer steroid taper and order cxr.  I think he would probably benefit from inhaled corticosteroid for maintenance, but this seems to be cost prohibitive for him at this time.  In the process of getting orange card, advised to f/u with PCP to further discuss this

## 2012-02-09 ENCOUNTER — Encounter: Payer: Self-pay | Admitting: Family Medicine

## 2012-02-09 ENCOUNTER — Ambulatory Visit: Payer: Self-pay | Admitting: Family Medicine

## 2012-02-09 ENCOUNTER — Ambulatory Visit (INDEPENDENT_AMBULATORY_CARE_PROVIDER_SITE_OTHER): Payer: Self-pay | Admitting: Family Medicine

## 2012-02-09 VITALS — BP 132/93 | HR 95 | Temp 98.3°F | Ht 70.5 in | Wt 252.1 lb

## 2012-02-09 DIAGNOSIS — D649 Anemia, unspecified: Secondary | ICD-10-CM

## 2012-02-09 DIAGNOSIS — J45909 Unspecified asthma, uncomplicated: Secondary | ICD-10-CM

## 2012-02-09 LAB — CBC
HCT: 24.4 % — ABNORMAL LOW (ref 39.0–52.0)
MCHC: 27 g/dL — ABNORMAL LOW (ref 30.0–36.0)
Platelets: 626 10*3/uL — ABNORMAL HIGH (ref 150–400)
RDW: 20.1 % — ABNORMAL HIGH (ref 11.5–15.5)
WBC: 13.6 10*3/uL — ABNORMAL HIGH (ref 4.0–10.5)

## 2012-02-09 NOTE — Patient Instructions (Signed)
Thank you for coming in today, it was good to see you I am concerned that your hemoglobin is still low I will call you tomorrow once I get the results of your labwork, we may have to bring you into the hospital for treatment.

## 2012-02-09 NOTE — Progress Notes (Signed)
  Subjective:    Patient ID: [name], male    DOB: November 10, 1965, 46 y.o.   MRN: [id number]  HPI  1. Followup breathing: Patient here for followup of recent asthma exacerbation. Recently finished steroid taper states from a pulmonary standpoint he feels much better without any wheezing. However he still often feels shortness of breath and has extreme fatigue. Has previous diagnosis of anemia and hemoglobin was 6.7 approximately one month ago. He says his fatigue is to the point where he doesn't even feel like getting out of bed in the morning and feels like he sleeps almost all the time. He also feels like with any exertion he feels dyspneic and feels like his heart pounds. He denies any chest pain with this. States he is scheduled for evaluation at [facility] with their gastroenterologist there.   He denies any  Bright red blood per rectum or dark tarry stools. Denies any blood in his urine.   Review of Systems     Objective:   Physical Exam  Constitutional: He is oriented to person, place, and time.       Pale appearance, NAD   HENT:  Head: Normocephalic and atraumatic.  Neck: Neck supple.  Cardiovascular: Normal rate, regular rhythm and normal heart sounds.   Pulmonary/Chest: Effort normal and breath sounds normal. No respiratory distress. He has no wheezes.  Abdominal: Soft. Bowel sounds are normal. He exhibits no distension. There is no tenderness. There is no rebound.  Neurological: He is alert and oriented to person, place, and time.  Psychiatric:       Anxious           Assessment & Plan:

## 2012-02-09 NOTE — Assessment & Plan Note (Signed)
Previous labs consistent with chronic iron deficiency anemia. Patient declines Hemoccult. Given that he is symptomatic today with extreme fatigue as well as dyspnea on exertion I think it is reasonable to check another hemoglobin today and if the level is the same or lower than previously may be reasonable for admission for transfusion  With further workup to figure out source of blood loss. Another issue is that he has not been compliant with his iron he says that he often skips days and sometimes only takes one a day. Reasons that he gives for this are that it causes nausea and some constipation.

## 2012-02-09 NOTE — Assessment & Plan Note (Signed)
Symptoms of asthma improved. Still feels short of breath with dyspnea on exertion however I think this is more related to his anemia at this point.

## 2012-02-10 ENCOUNTER — Telehealth: Payer: Self-pay | Admitting: Family Medicine

## 2012-02-10 NOTE — Telephone Encounter (Signed)
Called patient and left message.  Hemoglobin basically the same as previously (6.7-->6.6).  Discussed with Dr. McDiarmid who feels that admission is not necessary at this time.  If patient calls back please have ask him to be sure to take his iron supplementation three times per day.  It is ok to take with food if this makes him nauseous.  Can also use stool softener such as colace to help with constipation.  The main thing is for him to keep his appointment with GI at [facility] to find out a reason for his anemia.

## 2012-02-10 NOTE — Telephone Encounter (Signed)
Spoke with patient and read Dr Ashley Royalty message to him to take the iron 3 times daily and to make sure to keep the GI appointment to help find the problem. He says he is so weak that he cannot even get out of the bed and would like to speak to Dr Ashley Royalty.[name], Rodena Medin

## 2012-02-16 ENCOUNTER — Telehealth: Payer: Self-pay | Admitting: Family Medicine

## 2012-02-16 ENCOUNTER — Ambulatory Visit: Payer: Self-pay | Admitting: Family Medicine

## 2012-02-16 VITALS — BP 124/78 | HR 106 | Temp 97.9°F | Resp 16 | Ht 69.5 in | Wt 251.0 lb

## 2012-02-16 DIAGNOSIS — D62 Acute posthemorrhagic anemia: Secondary | ICD-10-CM

## 2012-02-16 DIAGNOSIS — R5383 Other fatigue: Secondary | ICD-10-CM

## 2012-02-16 DIAGNOSIS — D649 Anemia, unspecified: Secondary | ICD-10-CM

## 2012-02-16 DIAGNOSIS — D509 Iron deficiency anemia, unspecified: Secondary | ICD-10-CM

## 2012-02-16 NOTE — Progress Notes (Signed)
New Patient Visit:  HPI:  Pt presents today with chief complaint of fatigue nad request for provigil.  Pt with a baseline hx/o iron deficiency anemia currently being followed at [facility]. PCP Dr. Earnest Bailey.  Pt also uninsured, in process with [facility].  Pt states that he is very fatigued to the point that he cannot get out of bed.  No CP, SOB.  No fevers or chills. No dizziness or LOC.   Pt states that symptoms have been stable since his last PCP appointment.  Pt states that he has been compliant with his tid  iron regimen.  Pt states that he has been reading about provigil and feels that it would help his fatigue.  Pt is currently in process of following up with [facility] GI 04/06/12.    Patient Active Problem List  Diagnoses  . Anemia  . Depression  . GERD (gastroesophageal reflux disease)  . Asthma   Past Medical History: Past Medical History  Diagnosis Date  . Anemia, iron deficiency   . Exertional dyspnea     secondary to anemia  . Depression   . Constipation   . ADD (attention deficit disorder)   . Hiatal hernia   . Depression   . Blood transfusion   . Asthma   . ADD (attention deficit disorder)     Past Surgical History: No past surgical history on file.  Social History: History   Social History  . Marital Status: Single    Spouse Name: N/A    Number of Children: N/A  . Years of Education: N/A   Social History Main Topics  . Smoking status: Never Smoker   . Smokeless tobacco: Never Used  . Alcohol Use: No  . Drug Use: No  . Sexually Active: Not Currently   Other Topics Concern  . None   Social History Narrative   Lives alone. Never married, no children.  Has two sister who live in [place].  Student at work study.  [employer].  Working toward Chief Technology Officer.  Previously worked in Geophysicist/field seismologist.  Was also previously homeless due to job loss and psychiatric exacerbation.    Family History: Family History    Problem Relation Age of Onset  . Diabetes Paternal Grandfather   . Cancer Neg Hx   . Heart disease Neg Hx   . Stroke Neg Hx     Allergies: No Known Allergies  Current Outpatient Prescriptions  Medication Sig Dispense Refill  . albuterol (PROVENTIL HFA;VENTOLIN HFA) 108 (90 BASE) MCG/ACT inhaler Inhale 2 puffs into the lungs every 6 (six) hours as needed.      Marland Kitchen buPROPion (WELLBUTRIN) 100 MG tablet Take 200 mg by mouth 2 (two) times daily.        . ferrous sulfate 325 (65 FE) MG tablet Take 1 tablet (325 mg total) by mouth 3 (three) times daily with meals.  90 tablet  3  . folic acid (FOLVITE) 1 MG tablet Take 1 tablet (1 mg total) by mouth daily.  30 tablet  3  . methylphenidate (RITALIN) 20 MG tablet Take 20 mg by mouth 3 (three) times daily.       Marland Kitchen omeprazole (PRILOSEC) 20 MG capsule Take 1 capsule (20 mg total) by mouth daily.  30 capsule  3  . vitamin C (ASCORBIC ACID) 500 MG tablet Take 1 tablet (500 mg total) by mouth daily.  30 tablet  0  . predniSONE (DELTASONE) 10 MG tablet 5 tabs x3 days,  4 tabs x3 days, 3 tabs x 3 days, 2 tabs x3 days, 1 tabs x3 days, 1/2 tabs x3 days  47 tablet  0  . sertraline (ZOLOFT) 50 MG tablet Take 50 mg by mouth 2 (two) times daily.         Review Of Systems: negative except as noted above in HPI.   Physical Exam: Filed Vitals:   02/16/12 1842  BP: 124/78  Pulse: 106  Temp: 97.9 F (36.6 C)  Resp: 16   General: alert and mildly cooperative  HEENT: PERRLA and extra ocular movement intact Heart: S1, S2 normal, no murmur, rub or gallop, regular rate and rhythm Lungs: clear to auscultation, no wheezes or rales and unlabored breathing Abdomen: abdomen is soft without significant tenderness, masses, organomegaly or guarding Extremities: extremities normal, atraumatic, no cyanosis or edema Skin:no rashes, no pallor Neurology: normal without focal findings, mental status, speech normal, alert and oriented x3, PERLA and reflexes normal and  symmetric  Labs and Imaging: Lab Results  Component Value Date/Time   NA 137 01/07/2012  3:59 PM   K 4.5 01/07/2012  3:59 PM   CL 104 01/07/2012  3:59 PM   CO2 23 01/07/2012  3:59 PM   BUN 21 01/07/2012  3:59 PM   CREATININE 1.22 01/07/2012  3:59 PM   CREATININE 1.20 12/13/2011 11:49 AM   GLUCOSE 94 01/07/2012  3:59 PM   Lab Results  Component Value Date   WBC 13.6* 02/09/2012   HGB 6.6* 02/09/2012   HCT 24.4* 02/09/2012   MCV 58.0* 02/09/2012   PLT 626* 02/09/2012    Assessment and Plan: Had a relatively lengthy discussion with pt about fatigue. Discussed with pt that fatigue is predominantly from iron deficiency anemia and that provigil has no clinical indications for this. Discussed with pt that he was understandably frustrated because of lack of insurance and cost of care.    Discussed with pt that we could check a poct cbc to check for any decrease in hgb as this would possibly warrant admission for transfusion. Pt refused this.  Discussed option of going to emergency room for possible transfusion as care could be given without up front cost as [facility] [place] process is being completed. Pt declined this.  Pt subsequently became mildly combative with staff and demanded that he not be charged for visit out of cost concerns and that he "got nothing out of the visit" Again discussed options with pt and told pt to follow up with PCP if he had any other concerns or go to ED if sxs worsened.  Pt also upset because [facility] was a [facility] and that he was unaware of this.  Overall case was discussed with Dr. Alwyn Ren who also discussed decision making with pt.

## 2012-02-16 NOTE — Assessment & Plan Note (Addendum)
Received fax from patient stating he continues to have fatigue, awaiting to hear back from [facility] for financial assistance (is uninsured) thus has  limited options.  He requests Provigil as he can get a temporary medication assistance for this for his low energy.  I discussed with him that I did not feel that Provigil is the appropriate treatment for his anemia related fatigue.  We discussed his options as being Iron infusion, transfusion, oral iron.  He reports taking iron with yogurt twice daily.  I recommended he increased to three times daily, taking without food but with citrus or vit c to increase absorption.  I reiterated that it was important to get to the root cause.  He became frustrated in that he feels he will become homeless with inability to function and ended the conversation.

## 2012-02-16 NOTE — Telephone Encounter (Signed)
See documentation under anemia

## 2012-02-16 NOTE — Telephone Encounter (Signed)
Patient is requesting your review of another med called selegiline.  Please contact him to discuss ? rx for this.

## 2012-02-16 NOTE — Telephone Encounter (Signed)
Please call patient and let him know that selegiline is not indicated for his condition.  We have already discussed his treatment options for anemia earlier today on the phone.

## 2012-02-19 ENCOUNTER — Telehealth: Payer: Self-pay | Admitting: Family Medicine

## 2012-02-19 NOTE — Telephone Encounter (Signed)
Please call patient and let him know that I received his fax.  We cannot discuss medical issues over fax/ phone and he must come into the office to be evaluated further for his concerns.

## 2012-02-19 NOTE — Telephone Encounter (Signed)
LMOM for patient to call back.

## 2012-02-26 ENCOUNTER — Ambulatory Visit (INDEPENDENT_AMBULATORY_CARE_PROVIDER_SITE_OTHER): Payer: Self-pay | Admitting: Family Medicine

## 2012-02-26 ENCOUNTER — Telehealth: Payer: Self-pay | Admitting: *Deleted

## 2012-02-26 ENCOUNTER — Encounter: Payer: Self-pay | Admitting: Family Medicine

## 2012-02-26 VITALS — BP 110/70 | Temp 98.0°F | Ht 71.0 in | Wt 249.6 lb

## 2012-02-26 DIAGNOSIS — D649 Anemia, unspecified: Secondary | ICD-10-CM

## 2012-02-26 LAB — POCT HEMOGLOBIN: Hemoglobin: 5 g/dL — AB (ref 14.1–18.1)

## 2012-02-26 NOTE — Patient Instructions (Addendum)
Will send you to get 2 units of blood  Keep taking your iron and omeprazole  Will call you next week for CT abdomen and chest

## 2012-02-26 NOTE — Progress Notes (Signed)
  Subjective:    Patient ID: [name], male    DOB: [birth year], 46 y.o.   MRN: [id number]  HPI Returns for evaluation of anemia.  Has [name] financial assistance now- able to engage in treatment.  Continued fatigue,  States feels "lifeless"  Dyspnea with exertion.  No chest pain.   Continues to take oral iron supplements.  Has appt with GI End of July Review of Systems see HPI     Objective:   Physical Exam  GEN: NAD, pale CV: RRR Resp: CTAB      Assessment & Plan:

## 2012-02-26 NOTE — Telephone Encounter (Signed)
Patient has appt for blood transfusion with [facility] on 02/29/12 at 8:00am.  CT scan abd, pelvis, & chest for 03/01/12 at 9:30am--will need to register at Radiology dept at 9:15am.  Called patient and left message to call our office back today by 5:00pm.  Gaylene Brooks, RN

## 2012-02-26 NOTE — Telephone Encounter (Signed)
Called patient and informed of transfusion and CT scan appts.  Gaylene Brooks, RN

## 2012-02-26 NOTE — Assessment & Plan Note (Addendum)
hgb lower than baseline of 6.5 today at 5.0.  No signs of angina or syncope.  Unable to get him into short stay for transfusion today.  Will schedule him for Monday morning.    As we await GI referral, will obtain CT abd/pelvioc,. Chest to evaluate for malignancy as cause of anemia. Once seen by GI, if no source found. Will plan to refer to hematology.  CBC with Diff, peripheral smear shows no pathologic cause.

## 2012-02-29 ENCOUNTER — Ambulatory Visit (HOSPITAL_COMMUNITY)
Admission: RE | Admit: 2012-02-29 | Discharge: 2012-02-29 | Disposition: A | Discharge status: Home / Self Care | Payer: Self-pay | Source: Ambulatory Visit | Attending: Family Medicine | Admitting: Family Medicine

## 2012-02-29 DIAGNOSIS — R0609 Other forms of dyspnea: Secondary | ICD-10-CM | POA: Insufficient documentation

## 2012-02-29 DIAGNOSIS — D649 Anemia, unspecified: Secondary | ICD-10-CM | POA: Insufficient documentation

## 2012-02-29 DIAGNOSIS — R0989 Other specified symptoms and signs involving the circulatory and respiratory systems: Secondary | ICD-10-CM | POA: Insufficient documentation

## 2012-02-29 MED ORDER — ALBUTEROL SULFATE (5 MG/ML) 0.5% IN NEBU
2.5000 mg | INHALATION_SOLUTION | Freq: Once | RESPIRATORY_TRACT | Status: AC
  Administered 2012-02-29: 2.5 mg via RESPIRATORY_TRACT
  Filled 2012-02-29: qty 0.5

## 2012-02-29 NOTE — Progress Notes (Signed)
Patient c/o chest tightness and wheezing.  Stated that he did not do his inhaler this morning and needs a dose.  No other complaints. VSS. Spoke with Dr. Earnest Bailey who ordered an albuterol treatment and stated it was okay to continue with transfusion.

## 2012-02-29 NOTE — Progress Notes (Signed)
Patient has received albuterol nebulizer treatment.  Still c/o chest tightness and wheezing.  Spoke with Dr. Earnest Bailey who stated patient could schedule follow up appt at the office this afternoon and to continue with blood transfusion.

## 2012-03-01 ENCOUNTER — Inpatient Hospital Stay (HOSPITAL_COMMUNITY): Admission: RE | Admit: 2012-03-01 | Payer: Self-pay | Source: Ambulatory Visit

## 2012-03-01 ENCOUNTER — Ambulatory Visit (HOSPITAL_COMMUNITY)
Admission: RE | Admit: 2012-03-01 | Discharge: 2012-03-01 | Disposition: A | Discharge status: Home / Self Care | Payer: Self-pay | Source: Ambulatory Visit | Attending: Family Medicine | Admitting: Family Medicine

## 2012-03-01 ENCOUNTER — Encounter: Payer: Self-pay | Admitting: Family Medicine

## 2012-03-01 ENCOUNTER — Other Ambulatory Visit: Payer: Self-pay | Admitting: Family Medicine

## 2012-03-01 ENCOUNTER — Ambulatory Visit (INDEPENDENT_AMBULATORY_CARE_PROVIDER_SITE_OTHER): Payer: Self-pay | Admitting: Family Medicine

## 2012-03-01 ENCOUNTER — Other Ambulatory Visit (HOSPITAL_COMMUNITY): Payer: Self-pay

## 2012-03-01 VITALS — BP 130/80 | HR 88 | Resp 17 | Ht 71.0 in | Wt 249.2 lb

## 2012-03-01 DIAGNOSIS — D649 Anemia, unspecified: Secondary | ICD-10-CM

## 2012-03-01 DIAGNOSIS — N200 Calculus of kidney: Secondary | ICD-10-CM | POA: Insufficient documentation

## 2012-03-01 DIAGNOSIS — R0609 Other forms of dyspnea: Secondary | ICD-10-CM

## 2012-03-01 DIAGNOSIS — K228 Other specified diseases of esophagus: Secondary | ICD-10-CM | POA: Insufficient documentation

## 2012-03-01 DIAGNOSIS — K2289 Other specified disease of esophagus: Secondary | ICD-10-CM | POA: Insufficient documentation

## 2012-03-01 DIAGNOSIS — D509 Iron deficiency anemia, unspecified: Secondary | ICD-10-CM | POA: Insufficient documentation

## 2012-03-01 DIAGNOSIS — R0600 Dyspnea, unspecified: Secondary | ICD-10-CM | POA: Insufficient documentation

## 2012-03-01 LAB — TYPE AND SCREEN
ABO/RH(D): A POS
Antibody Screen: NEGATIVE
Unit division: 0

## 2012-03-01 LAB — CBC
HCT: 29.5 % — ABNORMAL LOW (ref 39.0–52.0)
Hemoglobin: 8.3 g/dL — ABNORMAL LOW (ref 13.0–17.0)
MCHC: 28.1 g/dL — ABNORMAL LOW (ref 30.0–36.0)
MCV: 61.1 fL — ABNORMAL LOW (ref 78.0–100.0)
RDW: 24.5 % — ABNORMAL HIGH (ref 11.5–15.5)

## 2012-03-01 MED ORDER — IOHEXOL 300 MG/ML  SOLN
100.0000 mL | Freq: Once | INTRAMUSCULAR | Status: AC | PRN
  Administered 2012-03-01: 100 mL via INTRAVENOUS

## 2012-03-01 MED ORDER — PREDNISONE 10 MG PO TABS: ORAL_TABLET | ORAL | Status: DC

## 2012-03-01 NOTE — Patient Instructions (Addendum)
We are checking a CBC today and this will be back tomorrow. I have sent in the Prednisone.

## 2012-03-01 NOTE — Assessment & Plan Note (Signed)
CBC today. Also has previously scheduled CT abdomen/pelvis looking for malignancy.

## 2012-03-01 NOTE — Progress Notes (Signed)
  Subjective:    Patient ID: [name], male    DOB: 07-05-1966, 46 y.o.   MRN: [id number]  HPI  46 yo M with PMH significant for ill defined anemia who presents today with 2 day history of increasing shortness of breath:  1.  SOB:  Began yesterday morning.  On further reflection, patient relates that he has had increased use of his albuterol inhaler for the past 2-3 weeks. He is using it anywhere from 3-5 times a day. Waking up to 3 times a week and using it once during the night as well. He states he feels "wheezy."  He presented yesterday for previously scheduled transfusion. He was short of breath prior to initiation of transfusion. He i he also had trouble with shortness of breath during the transfusion and was provided an albuterol nebulization which he states did not really help.  Presents today with more complaints of the same. He is also scheduled for a CT of his abdomen and pelvis looking for malignancy as cause of his anemia. Denies any chest pain. No cough. No fevers or chills. Dyspnea with minimal exertion.  Review of Systems See HPI above for review of systems.       Objective:   Physical Exam  Gen:  Alert, cooperative patient who appears stated age in no acute distress.  Vital signs reviewed. HEENT:  [place]/AT Cardiac:  Regular rate and rhythm without murmur auscultated.  Good S1/S2. Lungs:  Faint wheezing heard bilateral bases. No increased work of breathing. No tachypnea. No accessory muscle use. Abdomen:  Obese, nontender Ext:  No edema or erythema noted      Assessment & Plan:

## 2012-03-01 NOTE — Assessment & Plan Note (Signed)
Likely multifactorial. He was anemic 4.9 on CBC and 2 units of blood. This should have brought him up to about 6.9 which is still ow for his baseline. I believe this is contributing to his subjective shortness of breath. Past history of asthma. He does have some subjective wheezing on exam today. I provided him with a steroid burst today. Discuss with them that may need to add Atrovent to his home medication regimen. He has no insurance except for the [employer] access/[employer] Card and the MAP program does not have inhaled steroids which should be most beneficial to him. FU if no improvement.

## 2012-03-03 ENCOUNTER — Encounter: Payer: Self-pay | Admitting: Family Medicine

## 2012-03-03 ENCOUNTER — Telehealth: Payer: Self-pay | Admitting: Family Medicine

## 2012-03-03 NOTE — Telephone Encounter (Signed)
Discussed with patient results of CT, likely esophagitis, less likley tumor.  He will discuss need for endoscopy with GI in July.  I encouraged use of omeprazole, he declined additional med such as carafate.  Discussed avoidance of possible irritants (alcohol, etc).  Does not feel he has symptoms sometimes pain when swallowing, otherwise no reflux symptoms.

## 2012-05-24 ENCOUNTER — Telehealth: Payer: Self-pay | Admitting: Family Medicine

## 2012-05-24 DIAGNOSIS — J45909 Unspecified asthma, uncomplicated: Secondary | ICD-10-CM

## 2012-05-24 MED ORDER — ALBUTEROL SULFATE HFA 108 (90 BASE) MCG/ACT IN AERS: 2.0000 | INHALATION_SPRAY | Freq: Four times a day (QID) | RESPIRATORY_TRACT | Status: DC | PRN

## 2012-05-24 NOTE — Telephone Encounter (Signed)
rx called in and lmovm @ [facility] for albuterol inhaler.Loralee Pacas Conrad

## 2012-05-24 NOTE — Telephone Encounter (Signed)
Mr. Tassin requesting refill on his albuterol and [facility]  Have [facility] card and would like to utilize them for that

## 2012-05-25 ENCOUNTER — Telehealth: Payer: Self-pay | Admitting: Family Medicine

## 2012-05-26 ENCOUNTER — Telehealth: Payer: Self-pay | Admitting: Family Medicine

## 2012-05-26 DIAGNOSIS — J45909 Unspecified asthma, uncomplicated: Secondary | ICD-10-CM

## 2012-05-26 MED ORDER — ALBUTEROL SULFATE HFA 108 (90 BASE) MCG/ACT IN AERS: 2.0000 | INHALATION_SPRAY | Freq: Four times a day (QID) | RESPIRATORY_TRACT | Status: DC | PRN

## 2012-05-26 NOTE — Addendum Note (Signed)
Addended by: Salomon Mast on: 05/26/2012 02:16 PM   Modules accepted: Orders

## 2012-05-26 NOTE — Telephone Encounter (Addendum)
Spoke with patient and he states he will have to sign up for MAP and hey will be able to order for him, but this will take a while. He knows he can afford prednisone and was wondering if MD will send in RX for this while he is waiting.Immunologist. States he has been symptomatic for a couple of weeks. States Dr. Earnest Bailey know about him and  he would like  to ask first before he makes appointment to come in.

## 2012-05-26 NOTE — Telephone Encounter (Signed)
Patient notified and rx sent to [facility]

## 2012-05-26 NOTE — Telephone Encounter (Signed)
Pt states that the HD doesn't have the kind of inhaler that the doctor prescribed and they might have to order it if the doctor doesn't change it to a different one.  He is asking if that is the case, that he have prednisone called in until he can get the inhaler.  pls advise  [facility] [employer]

## 2012-05-26 NOTE — Telephone Encounter (Signed)
patient may pick up his albuterol from the health department of from [facility]- its is on the $4 list.  If he feels his breathing is worsening to where he needs prednisone, he must be seen for an office visit.

## 2012-05-27 NOTE — Telephone Encounter (Signed)
Looked back to see the last time that prednisone was given and it was 6.25.2013. Will forward to pcp for advice.Loralee Pacas [place]

## 2012-05-27 NOTE — Telephone Encounter (Signed)
Pt cannot afford the albuterol from [facility]- wants to know if he can get the prednisone - he is going to get on the MAP program, but it will take time to get it and he needs something in the mean time

## 2012-05-30 NOTE — Telephone Encounter (Signed)
Inhaler is on $4 list.  Patient must be seen for office visit for acute issues.

## 2012-05-30 NOTE — Telephone Encounter (Addendum)
Spoke with [employer].  Albuterol no longer generic---have to get Ventolin or ProAir and neither is on $4 list.  Inhaler is $44.  Patient has the orange card and will apply to get inhaler from [facility].  States he will not have an inhaler because it will be approximately 6 weeks before [facility] can order his meds.  Patient does not want the prednisone, but wants to know if there is another med he can use until he receives his inhaler.  Will check with Dr. Earnest Bailey and call patient back.  Gaylene Brooks, RN

## 2012-05-31 ENCOUNTER — Telehealth: Payer: Self-pay | Admitting: Family Medicine

## 2012-05-31 NOTE — Telephone Encounter (Signed)
There are no inexpensive alternatives i know of.  His best bet is to get in contact with the MAP program and pick up albuterol per Health dept formualry

## 2012-05-31 NOTE — Telephone Encounter (Signed)
Returned call to patient and left message to call our office back.  Hope Holst Ann, RN  

## 2012-05-31 NOTE — Telephone Encounter (Signed)
Patient is returning call to Altamese Dilling, RN.

## 2012-05-31 NOTE — Telephone Encounter (Signed)
Patient informed of message from Dr. Earnest Bailey.  Will get written Rx from Dr. Earnest Bailey to send to MAP program.  Gaylene Brooks, RN

## 2012-05-31 NOTE — Telephone Encounter (Signed)
See phone note from 05/25/12.  Gaylene Brooks, RN

## 2012-06-01 NOTE — Telephone Encounter (Signed)
Rx written by Dr. Earnest Bailey for Albuterol HFA #1 inhaler---2 puffs Q6hrs prn SOB with 3 refills.  Rx mailed to patient.  Gaylene Brooks, RN

## 2012-06-08 ENCOUNTER — Encounter (HOSPITAL_COMMUNITY): Payer: Self-pay

## 2012-06-08 ENCOUNTER — Emergency Department (INDEPENDENT_AMBULATORY_CARE_PROVIDER_SITE_OTHER)
Admission: EM | Admit: 2012-06-08 | Discharge: 2012-06-08 | Disposition: A | Discharge status: Home / Self Care | Payer: Self-pay | Source: Home / Self Care | Attending: Family Medicine | Admitting: Family Medicine

## 2012-06-08 DIAGNOSIS — R51 Headache: Secondary | ICD-10-CM

## 2012-06-08 MED ORDER — METHYLPREDNISOLONE ACETATE 80 MG/ML IJ SUSP
INTRAMUSCULAR | Status: AC
  Filled 2012-06-08: qty 1

## 2012-06-08 MED ORDER — AMITRIPTYLINE HCL 25 MG PO TABS: 25.0000 mg | ORAL_TABLET | Freq: Every day | ORAL | Status: DC

## 2012-06-08 MED ORDER — METHYLPREDNISOLONE ACETATE 40 MG/ML IJ SUSP
80.0000 mg | Freq: Once | INTRAMUSCULAR | Status: AC
  Administered 2012-06-08: 80 mg via INTRAMUSCULAR

## 2012-06-08 NOTE — ED Provider Notes (Signed)
History     CSN: [id number]  Arrival date & time 06/08/12  1705   First MD Initiated Contact with Patient 06/08/12 1838      Chief Complaint  Patient presents with  . Headache    (Consider location/radiation/quality/duration/timing/severity/associated sxs/prior treatment) Patient is a 46 y.o. male presenting with headaches. The history is provided by the patient.  Headache The primary symptoms include headaches and speech change. Primary symptoms do not include loss of consciousness, altered mental status, seizures, dizziness, visual change, paresthesias, focal weakness, loss of sensation, memory loss, fever, nausea or vomiting. The symptoms began 3 to 5 days ago. The symptoms are unchanged. The neurological symptoms are diffuse.  The headache is not associated with photophobia, visual change, neck stiffness or paresthesias.  Additional symptoms include anxiety and irritability. Additional symptoms do not include neck stiffness or photophobia. Associated symptoms comments: Was at [facility] and bp was elevated, pt anxious since..    Past Medical History  Diagnosis Date  . Anemia, iron deficiency   . Exertional dyspnea     secondary to anemia  . Depression   . Constipation   . ADD (attention deficit disorder)   . Hiatal hernia   . Depression   . Blood transfusion   . Asthma   . ADD (attention deficit disorder)     History reviewed. No pertinent past surgical history.  Family History  Problem Relation Age of Onset  . Diabetes Paternal Grandfather   . Cancer Neg Hx   . Heart disease Neg Hx   . Stroke Neg Hx     History  Substance Use Topics  . Smoking status: Never Smoker   . Smokeless tobacco: Never Used  . Alcohol Use: No      Review of Systems  Constitutional: Positive for irritability. Negative for fever.  HENT: Negative for neck stiffness.   Eyes: Negative for photophobia.  Gastrointestinal: Negative for nausea and vomiting.  Neurological: Positive  for speech change and headaches. Negative for dizziness, focal weakness, seizures, loss of consciousness and paresthesias.  Psychiatric/Behavioral: Negative for memory loss and altered mental status.  All other systems reviewed and are negative.    Allergies  Review of patient's allergies indicates no known allergies.  Home Medications   Current Outpatient Rx  Name Route Sig Dispense Refill  . BUPROPION HCL 100 MG PO TABS Oral Take 200 mg by mouth 2 (two) times daily.      Marland Kitchen FERROUS SULFATE 325 (65 FE) MG PO TABS Oral Take 1 tablet (325 mg total) by mouth 3 (three) times daily with meals. 90 tablet 3  . FOLIC ACID 1 MG PO TABS Oral Take 1 tablet (1 mg total) by mouth daily. 30 tablet 3  . METHYLPHENIDATE HCL ER 54 MG PO TBCR Oral Take 54 mg by mouth every morning.    Marland Kitchen OMEPRAZOLE 20 MG PO CPDR Oral Take 1 capsule (20 mg total) by mouth daily. 30 capsule 3  . SERTRALINE HCL 50 MG PO TABS Oral Take 50 mg by mouth 2 (two) times daily.      Marland Kitchen VITAMIN C 500 MG PO TABS Oral Take 1 tablet (500 mg total) by mouth daily. 30 tablet 0  . ALBUTEROL SULFATE HFA 108 (90 BASE) MCG/ACT IN AERS Inhalation Inhale 2 puffs into the lungs every 6 (six) hours as needed. 3.7 g 2  . AMITRIPTYLINE HCL 25 MG PO TABS Oral Take 1 tablet (25 mg total) by mouth at bedtime. 15 tablet 0  .  METHYLPHENIDATE HCL 20 MG PO TABS Oral Take 20 mg by mouth 3 (three) times daily.     Marland Kitchen PREDNISONE 10 MG PO TABS  5 tabs x3 days, 4 tabs x3 days, 3 tabs x 3 days, 2 tabs x3 days, 1 tabs x3 days, 1/2 tabs x3 days 47 tablet 0    BP 121/88  Pulse 104  Temp 98.5 F (36.9 C) (Oral)  Resp 10  SpO2 98%  Physical Exam  Nursing note and vitals reviewed. Constitutional: He is oriented to person, place, and time. He appears well-developed and well-nourished. No distress.  HENT:  Head: Normocephalic.  Right Ear: External ear normal.  Left Ear: External ear normal.  Mouth/Throat: Oropharynx is clear and moist.  Eyes: Conjunctivae  normal and EOM are normal. Pupils are equal, round, and reactive to light.  Neck: Normal range of motion. Neck supple.  Cardiovascular: Regular rhythm and normal heart sounds.   Pulmonary/Chest: Breath sounds normal.  Lymphadenopathy:    He has no cervical adenopathy.  Neurological: He is alert and oriented to person, place, and time.  Skin: Skin is warm and dry.  Psychiatric: He has a normal mood and affect. His behavior is normal.    ED Course  Procedures (including critical care time)  Labs Reviewed - No data to display No results found.   1. Headache disorder       MDM          Linna Hoff, MD 06/08/12 [phone]

## 2012-06-08 NOTE — ED Notes (Signed)
C/o HA, ears ringing for past couple of days, BP reading in MD office reportedly high (?machine out of order , per office staff?) repots HA, dizzy, ears ringing, no relief of his HA w asprin

## 2012-06-09 ENCOUNTER — Ambulatory Visit (INDEPENDENT_AMBULATORY_CARE_PROVIDER_SITE_OTHER): Payer: Self-pay | Admitting: Family Medicine

## 2012-06-09 ENCOUNTER — Encounter: Payer: Self-pay | Admitting: Family Medicine

## 2012-06-09 VITALS — BP 142/88 | HR 91 | Temp 98.0°F | Ht 71.0 in | Wt 259.0 lb

## 2012-06-09 DIAGNOSIS — R519 Headache, unspecified: Secondary | ICD-10-CM | POA: Insufficient documentation

## 2012-06-09 DIAGNOSIS — D649 Anemia, unspecified: Secondary | ICD-10-CM

## 2012-06-09 DIAGNOSIS — R51 Headache: Secondary | ICD-10-CM

## 2012-06-09 MED ORDER — CYCLOBENZAPRINE HCL 10 MG PO TABS: 10.0000 mg | ORAL_TABLET | Freq: Three times a day (TID) | ORAL | Status: DC | PRN

## 2012-06-09 MED ORDER — KETOROLAC TROMETHAMINE 60 MG/2ML IM SOLN
60.0000 mg | Freq: Once | INTRAMUSCULAR | Status: AC
  Administered 2012-06-09: 60 mg via INTRAMUSCULAR

## 2012-06-09 NOTE — Assessment & Plan Note (Signed)
Has been seen in [place] by GI-s/p colonoscopy which he states was nml.  Has seen Heme/Onc and given IV iron.  Last check hgb was 11.  Has not had lead level checked.  Will look into it.

## 2012-06-09 NOTE — Patient Instructions (Addendum)

## 2012-06-09 NOTE — Progress Notes (Signed)
  Subjective:    Patient ID: [name], male    DOB: 1966/08/22, 46 y.o.   MRN: [id number]  Headache  This is a new problem. The current episode started in the past 7 days. The problem occurs constantly. The problem has been waxing and waning. The pain is located in the bilateral region. The pain does not radiate. The pain quality is not similar to prior headaches. The quality of the pain is described as squeezing. The pain is moderate. Associated symptoms include phonophobia, photophobia and tinnitus. Pertinent negatives include no abdominal pain, drainage, eye watering, facial sweating, fever, muscle aches, neck pain, rhinorrhea, scalp tenderness, seizures, sinus pressure, vomiting or weakness. The symptoms are aggravated by activity, bright light and noise. He has tried NSAIDs (recieved steroid injection at urgent care) for the symptoms. The treatment provided no relief. His past medical history is significant for hypertension. There is no history of migraine headaches or obesity.      Review of Systems  Constitutional: Negative for fever.  HENT: Positive for tinnitus. Negative for rhinorrhea, neck pain and sinus pressure.   Eyes: Positive for photophobia.  Respiratory: Negative for shortness of breath.   Gastrointestinal: Negative for vomiting and abdominal pain.  Neurological: Positive for headaches. Negative for seizures and weakness.       No vision changes       Objective:   Physical Exam  Vitals reviewed. Constitutional: He appears well-developed and well-nourished.  HENT:  Head: Normocephalic and atraumatic.  Eyes: Pupils are equal, round, and reactive to light. No scleral icterus.  Fundoscopic exam:      The right eye shows no AV nicking.       The left eye shows no AV nicking.       Discs not easily visualized as pt. Could not tolerate light in eyes.  Cardiovascular: Normal rate and regular rhythm.   Pulmonary/Chest: Breath sounds normal.  Abdominal: Soft. There is  no tenderness.          Assessment & Plan:

## 2012-06-09 NOTE — Assessment & Plan Note (Signed)
Unclear etiology--has h/o marked anemia with Hgb of 4.9 requiring blood transfusion.   Reports headache and ringing in his ears.  [place] old building.  Has stopped drinking water.  Not clearly migrainous or cluster like.  No sinus issues, ? Tension.  BP is mildly up-may warrant medication if continues. Will give toradol and flexeril today. Check lead level.  If no improvement, return on Monday 10/7 for re-check.

## 2012-06-10 ENCOUNTER — Telehealth: Payer: Self-pay | Admitting: Family Medicine

## 2012-06-10 NOTE — Telephone Encounter (Signed)
Patient is calling because after getting his shot of Toradol yesterday.  He woke up around 2am and vomited violently and then he slept until around 1:00 today.  His headaches is not gone, but he wanted the symptoms to be noted.

## 2012-06-10 NOTE — Telephone Encounter (Signed)
FYI to MD

## 2012-06-11 LAB — HEAVY METALS, BLOOD: Mercury, B: <4 mcg/L (ref <=10)

## 2012-06-13 ENCOUNTER — Encounter: Payer: Self-pay | Admitting: *Deleted

## 2012-06-13 NOTE — Telephone Encounter (Signed)
Noted  

## 2012-06-14 ENCOUNTER — Telehealth: Payer: Self-pay | Admitting: Family Medicine

## 2012-06-14 NOTE — Telephone Encounter (Signed)
Letter created and faxed as requested.  LMOVM informing pt that letter was faxed. Marieme Mcmackin, Maryjo [place]

## 2012-06-14 NOTE — Telephone Encounter (Signed)
Was here on the 3rd of Oct and needs a note for work faxed to [phone]

## 2012-09-09 ENCOUNTER — Encounter: Payer: Self-pay | Admitting: Family Medicine

## 2012-09-09 ENCOUNTER — Ambulatory Visit (INDEPENDENT_AMBULATORY_CARE_PROVIDER_SITE_OTHER): Payer: Self-pay | Admitting: Family Medicine

## 2012-09-09 VITALS — BP 153/97 | HR 102 | Temp 97.8°F | Ht 71.0 in | Wt 269.5 lb

## 2012-09-09 DIAGNOSIS — J45909 Unspecified asthma, uncomplicated: Secondary | ICD-10-CM

## 2012-09-09 DIAGNOSIS — R0600 Dyspnea, unspecified: Secondary | ICD-10-CM

## 2012-09-09 DIAGNOSIS — R0989 Other specified symptoms and signs involving the circulatory and respiratory systems: Secondary | ICD-10-CM

## 2012-09-09 DIAGNOSIS — D649 Anemia, unspecified: Secondary | ICD-10-CM

## 2012-09-09 LAB — POCT HEMOGLOBIN: Hemoglobin: 10.2 g/dL — AB (ref 14.1–18.1)

## 2012-09-09 MED ORDER — PREDNISONE 50 MG PO TABS: 50.0000 mg | ORAL_TABLET | Freq: Every day | ORAL | Status: DC

## 2012-09-09 NOTE — Assessment & Plan Note (Signed)
Asthma appears uncontrolled.  Patient in need of inhaled corticosteroid, but has no insurance and MAP does not provide them either.  Gave prednisone burst x 5 days.  Will need close follow up with PCP.

## 2012-09-09 NOTE — Assessment & Plan Note (Signed)
Check POCT Hb today given symptoms.  Hemoglobin improved to 10.2 from prior 8.3.  Anemia may be contributing to fatigue and dyspnea.  However, Hemoglobin is stable and markedly improved.

## 2012-09-09 NOTE — Progress Notes (Signed)
Subjective:     Patient ID: CATCHER DEHOYOS, male   DOB: August 18, 1966, 47 y.o.   MRN: [id number]  HPI 47 year old male with PMH of asthma and iron deficiency anemia presents with increased frequency of wheezing and frequent albuterol use.  1) Wheezing, Daily use of Albuterol MDI - Patient reports that he has been wheezing more for the past 2 weeks requiring frequent albuterol use (daily). Unsure of nighttime awakenings - He also reports some limitation of daily/normal activities - He also reports fatigue and associated SOB, especially with exertion/activity - Denies cough, fever, chills  Review of Systems See HPI    Objective:   Physical Exam General: well appearing, NAD Heart: RRR. No murmurs, rubs, or gallops. Lungs: CTAB. No wheezing appreciated.    Assessment:         Plan:

## 2012-09-09 NOTE — Patient Instructions (Addendum)
It was nice seeing you today.  I have prescribed a burst of Prednisone for you.  It will be for 5 days.  Eventually, you will need an asthma controller medicine (inhaled corticosteroid).  In regards to your Blood pressure, please return in 1 month to see your regular PCP.  If your SOB worsens please call the clinic or go to the ED.

## 2012-10-22 ENCOUNTER — Other Ambulatory Visit: Payer: Self-pay

## 2013-01-16 ENCOUNTER — Ambulatory Visit (INDEPENDENT_AMBULATORY_CARE_PROVIDER_SITE_OTHER): Payer: Self-pay | Admitting: Family Medicine

## 2013-01-16 VITALS — BP 130/78 | HR 100 | Temp 97.7°F | Ht 71.0 in | Wt 261.0 lb

## 2013-01-16 DIAGNOSIS — R0989 Other specified symptoms and signs involving the circulatory and respiratory systems: Secondary | ICD-10-CM

## 2013-01-16 DIAGNOSIS — D649 Anemia, unspecified: Secondary | ICD-10-CM

## 2013-01-16 DIAGNOSIS — R0609 Other forms of dyspnea: Secondary | ICD-10-CM

## 2013-01-16 DIAGNOSIS — R0600 Dyspnea, unspecified: Secondary | ICD-10-CM

## 2013-01-16 DIAGNOSIS — J45909 Unspecified asthma, uncomplicated: Secondary | ICD-10-CM

## 2013-01-16 LAB — CBC
HCT: 24.2 % — ABNORMAL LOW (ref 39.0–52.0)
Hemoglobin: 7.1 g/dL — ABNORMAL LOW (ref 13.0–17.0)
RDW: 17.3 % — ABNORMAL HIGH (ref 11.5–15.5)
WBC: 7.2 10*3/uL (ref 4.0–10.5)

## 2013-01-16 MED ORDER — PREDNISONE 20 MG PO TABS: 20.0000 mg | ORAL_TABLET | Freq: Every day | ORAL | Status: DC

## 2013-01-16 MED ORDER — ALBUTEROL SULFATE (5 MG/ML) 0.5% IN NEBU
2.5000 mg | INHALATION_SOLUTION | Freq: Once | RESPIRATORY_TRACT | Status: AC
  Administered 2013-01-16: 2.5 mg via RESPIRATORY_TRACT

## 2013-01-16 MED ORDER — IPRATROPIUM BROMIDE 0.02 % IN SOLN
0.5000 mg | Freq: Once | RESPIRATORY_TRACT | Status: AC
  Administered 2013-01-16: 0.5 mg via RESPIRATORY_TRACT

## 2013-01-16 NOTE — Patient Instructions (Signed)
It was nice to meet you, please start taking the prednisone, one will daily for 7 days.  Also, I recommend taking an allergy medication such as Allegra or Claritin (you can take the generic of either).   Please make an appointment if you are not feeling better by the end of the week.  We will call you with your lab results.

## 2013-01-16 NOTE — Progress Notes (Signed)
  Subjective:    Patient ID: Jeffery BLATZ, male    DOB: 02-23-1966, 47 y.o.   MRN: [id number]  HPI  Germaine comes in with acute asthma.  He says it has been getting worse the past couple of days, but today he felt like his albuterol inhaler was not helping.  He denies fever or productive cough.    He also complains of feeling fatigued and light headed, which he has had before from his severe anemia.  He says that he got an IV iron infusion about 7-8 months ago and his hemoglobin has been doing OK since then, but over the past few weeks he has not felt well.   Past Medical History  Diagnosis Date  . Anemia, iron deficiency   . Exertional dyspnea     secondary to anemia  . Depression   . Constipation   . ADD (attention deficit disorder)   . Hiatal hernia   . Depression   . Blood transfusion   . Asthma   . ADD (attention deficit disorder)      Review of Systems See HPI    Objective:   Physical Exam BP 130/78  Pulse 100  Temp(Src) 97.7 F (36.5 C) (Oral)  Ht 5\' 11"  (1.803 m)  Wt 261 lb (118.389 kg)  BMI 36.42 kg/m2 General appearance: alert, cooperative and no distress Lungs: Poor air movement with diffuse inspiratory and expiratory wheezint Heart: regular rate and rhythm, S1, S2 normal, no murmur, click, rub or gallop Extremities: extremities normal, atraumatic, no cyanosis or edema Pulses: 2+ and symmetric      Assessment & Plan:

## 2013-01-16 NOTE — Assessment & Plan Note (Signed)
Improved with Combivent today, Rx for prednisone course, f/u at end of week if not improving.

## 2013-01-16 NOTE — Assessment & Plan Note (Signed)
Will check CBC, symptoms may be from anemia vs. Iron.

## 2013-01-17 ENCOUNTER — Telehealth: Payer: Self-pay | Admitting: Family Medicine

## 2013-01-17 NOTE — Telephone Encounter (Signed)
Called patient, let him know hemoglobin was back down to 7.1.  He says he is feeling better from the prednisone (having asthma exacerbation).  I discussed with him that I recommend getting another IV iron infusion as this seemed to help him the most before.  He got this at [facility] before.  I told him that it may be easiest to call his doctor there to see if this can be repeated.  I told him it may be possible for our clinic to order it but we do not do infusions here so he may have to go to short stay.  He agrees to call [facility], but knows to call us back if he cannot get into their clinic within the next week.  He also understands that severe fatigue, chest pain, dyspnea are reasons to call office or go to ER.    I released his lab results to My Chart so he would have a record to take to [facility] if needed.

## 2013-02-07 ENCOUNTER — Encounter: Payer: Self-pay | Admitting: Family Medicine

## 2013-06-24 ENCOUNTER — Other Ambulatory Visit: Payer: Self-pay | Admitting: Family Medicine

## 2013-06-24 DIAGNOSIS — J45909 Unspecified asthma, uncomplicated: Secondary | ICD-10-CM

## 2013-06-24 MED ORDER — ALBUTEROL SULFATE HFA 108 (90 BASE) MCG/ACT IN AERS: 2.0000 | INHALATION_SPRAY | Freq: Four times a day (QID) | RESPIRATORY_TRACT | Status: DC | PRN

## 2013-06-24 NOTE — Progress Notes (Signed)
After hours call:  Patient called after hours line for refill on Albuterol. He has been on this chronically and is feeling increased SOB and wheezing. His current inhaler is out and no additional refills at the pharmacy. He reports no other symptoms, and has an appointment on Oct 30 for routine check up.  Explained to patient that typically we do not do refills over the advice line, however if this inhaler will prevent full asthma exacerbation I feel it is better to send in one refill for him at this time. Sent #[facility]R [facility] [address] and patient to keep follow up appointment with [facility].  Khaliah Barnick M. Wanda Rideout, M.D.

## 2013-06-25 ENCOUNTER — Other Ambulatory Visit: Payer: Self-pay | Admitting: Family Medicine

## 2013-06-25 DIAGNOSIS — J45909 Unspecified asthma, uncomplicated: Secondary | ICD-10-CM

## 2013-06-25 MED ORDER — ALBUTEROL SULFATE HFA 108 (90 BASE) MCG/ACT IN AERS: 2.0000 | INHALATION_SPRAY | Freq: Four times a day (QID) | RESPIRATORY_TRACT | Status: DC | PRN

## 2013-07-06 ENCOUNTER — Ambulatory Visit (INDEPENDENT_AMBULATORY_CARE_PROVIDER_SITE_OTHER): Payer: No Typology Code available for payment source | Admitting: Family Medicine

## 2013-07-06 ENCOUNTER — Encounter: Payer: Self-pay | Admitting: Family Medicine

## 2013-07-06 VITALS — BP 135/87 | HR 87 | Temp 98.6°F | Ht 71.0 in | Wt 254.0 lb

## 2013-07-06 DIAGNOSIS — R5383 Other fatigue: Secondary | ICD-10-CM

## 2013-07-06 DIAGNOSIS — J45909 Unspecified asthma, uncomplicated: Secondary | ICD-10-CM

## 2013-07-06 DIAGNOSIS — R5381 Other malaise: Secondary | ICD-10-CM

## 2013-07-06 DIAGNOSIS — F329 Major depressive disorder, single episode, unspecified: Secondary | ICD-10-CM

## 2013-07-06 DIAGNOSIS — F909 Attention-deficit hyperactivity disorder, unspecified type: Secondary | ICD-10-CM

## 2013-07-06 LAB — CBC WITH DIFFERENTIAL/PLATELET
Eosinophils Absolute: 0.4 10*3/uL (ref 0.0–0.7)
Hemoglobin: 9.7 g/dL — ABNORMAL LOW (ref 13.0–17.0)
Lymphs Abs: 1.7 10*3/uL (ref 0.7–4.0)
MCH: 20 pg — ABNORMAL LOW (ref 26.0–34.0)
Monocytes Relative: 7 % (ref 3–12)
Neutrophils Relative %: 63 % (ref 43–77)
RBC: 4.85 MIL/uL (ref 4.22–5.81)

## 2013-07-06 LAB — COMPLETE METABOLIC PANEL WITH GFR
ALT: 11 U/L (ref 0–53)
AST: 16 U/L (ref 0–37)
Albumin: 4 g/dL (ref 3.5–5.2)
Calcium: 9.1 mg/dL (ref 8.4–10.5)
Chloride: 109 mEq/L (ref 96–112)
Potassium: 4.7 mEq/L (ref 3.5–5.3)
Sodium: 140 mEq/L (ref 135–145)
Total Protein: 6.8 g/dL (ref 6.0–8.3)

## 2013-07-06 NOTE — Patient Instructions (Signed)

## 2013-07-06 NOTE — Progress Notes (Signed)
Subjective:     Patient ID: [name], male   DOB: 09-May-1966, 47 y.o.   MRN: [id number]  HPI Fatigue:C/O extreme fatigue almost everyday. Feels sleepy all the time both day and night.he denies any breathing problem,he lives alone and cannot tell if he snores,but denies any apnea..No hair loss,no blood loss.Appetite normal. Colonoscopy about 1 yrs ago was normal.No feeling of depression,under stress from school work,he is trying to graduate,had to take transportation to class. Asthma:Denies any SOB,he is currently on albuterol as needed which he had not needed so often,he need refill for his medication. Depression:Currently doing well, he was in severe depression few years ago when he lost his home and became homeless. He has been on Wellbutrin since then,doing well now,he now has a home.He denies any feeling of hurting himself,he feels stressed from school but otherwise good. ADHD:Patient goes to [facility] for his care,he is currently on Ritalin and Concerta as prescribed by the healthcare provider at [facility]. He is requesting letter to support his use of Ritalin.  Current Outpatient Prescriptions on File Prior to Visit  Medication Sig Dispense Refill  . buPROPion (WELLBUTRIN) 100 MG tablet Take 200 mg by mouth daily.       . methylphenidate (CONCERTA) 54 MG CR tablet Take 54 mg by mouth every morning.      . sertraline (ZOLOFT) 50 MG tablet Take 50 mg by mouth daily.       . ferrous sulfate 325 (65 FE) MG tablet Take 1 tablet (325 mg total) by mouth 3 (three) times daily with meals.  90 tablet  3  . omeprazole (PRILOSEC) 20 MG capsule Take 1 capsule (20 mg total) by mouth daily.  30 capsule  3   No current facility-administered medications on file prior to visit.   Past Medical History  Diagnosis Date  . Anemia, iron deficiency   . Exertional dyspnea     secondary to anemia  . Depression   . Constipation   . ADD (attention deficit disorder)   . Hiatal hernia   . Depression   .  Blood transfusion   . Asthma   . ADD (attention deficit disorder)       Review of Systems  Constitutional: Positive for fatigue.  Respiratory: Negative.   Cardiovascular: Negative.   Gastrointestinal: Negative.   Genitourinary: Negative.   Psychiatric/Behavioral: Negative for suicidal ideas. The patient is not nervous/anxious.   All other systems reviewed and are negative.   Filed Vitals:   07/06/13 1336  BP: 135/87  Pulse: 87  Temp: 98.6 F (37 C)  TempSrc: Oral  Height: 5\' 11"  (1.803 m)  Weight: 254 lb (115.214 kg)       Objective:   Physical Exam  Nursing note and vitals reviewed. Constitutional: He is oriented to person, place, and time. He appears well-developed. No distress.  Cardiovascular: Normal rate, regular rhythm, normal heart sounds and intact distal pulses.   No murmur heard. Pulmonary/Chest: Effort normal and breath sounds normal. No respiratory distress. He has no wheezes. He exhibits no tenderness.  Abdominal: Soft. Bowel sounds are normal. He exhibits no distension and no mass. There is no tenderness.  Musculoskeletal: Normal range of motion. He exhibits no edema.  Neurological: He is alert and oriented to person, place, and time.  Psychiatric: He has a normal mood and affect. His behavior is normal. Judgment and thought content normal.       Assessment:     Fatigue Asthma Depression ADHD  Plan:     Check problem list

## 2013-07-07 ENCOUNTER — Encounter: Payer: Self-pay | Admitting: Family Medicine

## 2013-07-07 DIAGNOSIS — R5383 Other fatigue: Secondary | ICD-10-CM | POA: Insufficient documentation

## 2013-07-07 DIAGNOSIS — R5382 Chronic fatigue, unspecified: Secondary | ICD-10-CM | POA: Insufficient documentation

## 2013-07-07 DIAGNOSIS — F909 Attention-deficit hyperactivity disorder, unspecified type: Secondary | ICD-10-CM | POA: Insufficient documentation

## 2013-07-07 LAB — TSH: TSH: 1.849 u[IU]/mL (ref 0.350–4.500)

## 2013-07-07 LAB — TESTOSTERONE: Testosterone: 213 ng/dL — ABNORMAL LOW (ref 300–890)

## 2013-07-07 MED ORDER — ALBUTEROL SULFATE HFA 108 (90 BASE) MCG/ACT IN AERS: 2.0000 | INHALATION_SPRAY | Freq: Four times a day (QID) | RESPIRATORY_TRACT | Status: DC | PRN

## 2013-07-07 NOTE — Assessment & Plan Note (Signed)
No acute symptom. Patient stable. I refilled his Albuterol.

## 2013-07-07 NOTE — Assessment & Plan Note (Signed)
As dicussed with patient both Concerta and Ritalin has similar s/e and I will not put a patient on both. Plus I am not specialized in adult ADHD. He stated S/E of medication had been discussed with him at [facility] which include Arrythmia's,sudden cardiac death,seizure,tachycardia etc. He need letter stating his heart is strong enough for both medication. Patient instruction he would need cardiology consultation for clearance. I will write a letter stating patient from my assessment is health but need cardiology assessment.

## 2013-07-07 NOTE — Assessment & Plan Note (Signed)
Stable on Wellbutrin. Continue current dose and f/u with [facility] as planned.

## 2013-07-07 NOTE — Assessment & Plan Note (Signed)
Etiology unclear. R/O Anemia, thyroid dysfunction,hypogonadism,stress. Unlikely sleep apnea since he is not morbidly obese and does not have apnea. Last Hb 7.1 CMet checked as well.  NB: Result Hb >9 patient informed via phone call that this could be contributing to his fatigue, I recommended iron replacement and follow up with his hematologist. Testosterone low: Plan to recheck in 4 wks. Other test looks good.

## 2013-07-13 ENCOUNTER — Other Ambulatory Visit: Payer: Self-pay

## 2013-07-26 ENCOUNTER — Other Ambulatory Visit: Payer: No Typology Code available for payment source

## 2013-07-26 ENCOUNTER — Other Ambulatory Visit: Payer: Self-pay | Admitting: Family Medicine

## 2013-07-26 DIAGNOSIS — R5381 Other malaise: Secondary | ICD-10-CM

## 2013-07-26 DIAGNOSIS — R5383 Other fatigue: Secondary | ICD-10-CM

## 2013-07-26 NOTE — Progress Notes (Signed)
TESTOSTERONE DONE TODAY Collyns Mcquigg 

## 2013-07-27 ENCOUNTER — Encounter: Payer: Self-pay | Admitting: Family Medicine

## 2013-07-27 ENCOUNTER — Telehealth: Payer: Self-pay | Admitting: Family Medicine

## 2013-07-27 NOTE — Telephone Encounter (Signed)
Message left to call back for test result. Testosterone still low, lesser than the previous value/number. Schedule f/u to discuss management plan.

## 2013-07-27 NOTE — Telephone Encounter (Signed)
Pt has an appt to see Dr. Gayla Doss on 07/31/13 in the afternoon.  Can you let him know what your plans are for this visit?  Thanks [employer]

## 2013-07-28 ENCOUNTER — Encounter: Payer: Self-pay | Admitting: Family Medicine

## 2013-07-28 NOTE — Telephone Encounter (Signed)
Patient coming to see you next week to discuss hypogonadism, please discuss treatment recommendation with him.

## 2013-07-31 ENCOUNTER — Other Ambulatory Visit: Payer: Self-pay | Admitting: Family Medicine

## 2013-07-31 ENCOUNTER — Ambulatory Visit (INDEPENDENT_AMBULATORY_CARE_PROVIDER_SITE_OTHER): Payer: No Typology Code available for payment source | Admitting: Family Medicine

## 2013-07-31 ENCOUNTER — Encounter: Payer: Self-pay | Admitting: Family Medicine

## 2013-07-31 VITALS — BP 139/90 | HR 99 | Temp 97.5°F | Wt 258.0 lb

## 2013-07-31 DIAGNOSIS — D649 Anemia, unspecified: Secondary | ICD-10-CM

## 2013-07-31 DIAGNOSIS — E291 Testicular hypofunction: Secondary | ICD-10-CM

## 2013-07-31 DIAGNOSIS — R7989 Other specified abnormal findings of blood chemistry: Secondary | ICD-10-CM

## 2013-07-31 NOTE — Assessment & Plan Note (Signed)
Last Hgb 9.7 Discussed getting Iron Infusion at [facility] - Given release of information to allow [facility] to see out labs if he continues to see the for infusions

## 2013-07-31 NOTE — Assessment & Plan Note (Signed)
Low am testosterone level - Denies symptoms other than fatigue: No hair loss, dec libido or sperm production; no testicular pain; gynecomastia, Worsening HAs or peripheral vision loss - Discussed treatment options and risk: Injections being the cheapest option, but likely not covered my Orange card; Will look into Medication Assistance Programs - Obtain [facility] level to differentiate primary from secondary low testosterone - Consider MRI to assess pituitary gland if LH and [facility] are low  - Call to schedule apt with PCP once labs are back

## 2013-07-31 NOTE — Patient Instructions (Signed)
It was great seeing you today. Below is a list of the things we talked about:   1. We will test your [facility] and LH. You will need to come back early the morning to have your blood draw. 2. Talk with the front desk staff about release of medical information.  Please bring all your medications to every doctors visit  Sign up for My Chart to have easy access to your labs results, and communication with your Primary care physician.   Please check-out at the front desk before leaving the clinic.   Make an appointment appointment with Dr Lum Babe once your lab results are back.   I look forward to talking with you again at our next visit. If you have any questions or concerns before then, please call the clinic at [phone].  Take Care,   Dr Wenda Low

## 2013-07-31 NOTE — Progress Notes (Signed)
Subjective:     Patient ID: [name], male   DOB: Jul 29, 1966, 47 y.o.   MRN: [id number]  HPI Comments: Pt comes in today to discuss fatigue and recent lab results. He endorses fatigue for the last several years, and a hx of chronic iron deficiency anemia currently be treated with Iron infusion at [facility]. He reports that his fatigue has recently become worse and has been notified that his testosterone level is low. He denies any decrease libido or sperm production, erectile dysfunction, hair loss, testicular pain or FHx of testicular cancer. He endorses a HA today that has been present for 3 days with some blurry vision, but no loss or peripheral vision or gynecomastia.    Review of Systems  Constitutional: Positive for activity change and fatigue. Negative for fever and unexpected weight change.  Gastrointestinal: Negative for blood in stool.  Genitourinary: Negative for urgency, frequency, hematuria, decreased urine volume, scrotal swelling and testicular pain.  Neurological: Positive for headaches.  Psychiatric/Behavioral: Positive for sleep disturbance.       Objective:   Physical Exam  Constitutional: He appears well-developed and well-nourished.  Eyes: EOM are normal. Pupils are equal, round, and reactive to light.  Cardiovascular: Normal rate and regular rhythm.   Murmur heard. Pulmonary/Chest: Effort normal and breath sounds normal.   Assessment/Plan:      See Problem Focused Assessment & Plan

## 2013-08-01 ENCOUNTER — Encounter: Payer: Self-pay | Admitting: Family Medicine

## 2013-08-01 ENCOUNTER — Telehealth: Payer: Self-pay | Admitting: *Deleted

## 2013-08-01 NOTE — Telephone Encounter (Signed)
Patient returned call and was given message.[name], Niklas Lee  

## 2013-08-01 NOTE — Telephone Encounter (Signed)
Left message for [name] to return my call. Please tell him he does not need to come in for his lab appointment tomorrow. The blood work that Dr Gayla Doss has added can be done off the previous labs drawn. I have already cancelled the appointment for tomorrow.Sani Loiseau, Rodena Medin

## 2013-08-02 ENCOUNTER — Other Ambulatory Visit: Payer: No Typology Code available for payment source

## 2013-08-07 ENCOUNTER — Encounter: Payer: Self-pay | Admitting: Family Medicine

## 2013-08-08 ENCOUNTER — Telehealth: Payer: Self-pay | Admitting: Family Medicine

## 2013-08-08 NOTE — Telephone Encounter (Signed)
Pt would like Dr. Lum Babe to call ASAP. He has a lot of questions concerning his labs. He said that he been emailing for a few days now with no response. jw

## 2013-08-09 ENCOUNTER — Other Ambulatory Visit: Payer: Self-pay | Admitting: Family Medicine

## 2013-08-09 ENCOUNTER — Encounter: Payer: Self-pay | Admitting: Family Medicine

## 2013-08-09 DIAGNOSIS — E291 Testicular hypofunction: Secondary | ICD-10-CM

## 2013-08-09 NOTE — Telephone Encounter (Signed)
Left message for pt to call back and schedule a lab appt. Gayatri Teasdale,CMA

## 2013-08-09 NOTE — Telephone Encounter (Signed)
I got a message that patient has been trying to reach me via Mychart e-mail and patient needed to talk about his result. I reviewed his chart and indeed he has been emailing but I never got the emails in my in-box for an unknown reasons. I called patient and explained this to him. He asked multiple questions about his test result. 1. He is anemic and needed follow up for iron infusion. He has been getting this done at [facility] and was wondering if he can get it done with [facility]. I informed him he can but there might be a delay with referral since he does not have insurance. I will check to see if there is a hematologist who will take orange card and see him. 2. He is concern about his hypogonadism. Treatment recommendations discussed with him including conservative management and testosterone replacement therapy. S/E of Testosterone therapy discussed with patient in detail including prostate cancer, BPH, Liver cancer,polycythemia ( which might help with his anemia),Priapism, VTE,GI upset,low sperm count. He understand all this side effect but he strongly requesting for testosterone replacement. He stated he is not functioning well, he is fatigued in addition to his anemia.  Prior to starting Testosterone replacement I recommended baseline PSA check, he already has baseline CBC few weeks ago,based on the PSA result I will determine if I should start him on Testosterone replacement therapy at his request. He verbalized understanding of all information given and agreed with plan.

## 2013-08-10 ENCOUNTER — Telehealth: Payer: Self-pay | Admitting: Family Medicine

## 2013-08-10 NOTE — Telephone Encounter (Signed)
I spoke with patient and discussed possibility of Concerta and Ritalin as a potential cause of Anemia since it caused Pancytopenia.

## 2013-08-11 ENCOUNTER — Other Ambulatory Visit: Payer: Self-pay

## 2013-08-14 ENCOUNTER — Other Ambulatory Visit: Payer: No Typology Code available for payment source

## 2013-08-14 DIAGNOSIS — E291 Testicular hypofunction: Secondary | ICD-10-CM

## 2013-08-14 DIAGNOSIS — R7989 Other specified abnormal findings of blood chemistry: Secondary | ICD-10-CM

## 2013-08-14 NOTE — Progress Notes (Signed)
FSH,LH AND PSA DONE TODAY [name]

## 2013-08-15 ENCOUNTER — Encounter: Payer: Self-pay | Admitting: Family Medicine

## 2013-08-15 ENCOUNTER — Telehealth: Payer: Self-pay | Admitting: Family Medicine

## 2013-08-15 DIAGNOSIS — E291 Testicular hypofunction: Secondary | ICD-10-CM

## 2013-08-15 LAB — PSA: PSA: 0.56 ng/mL (ref <=4.00)

## 2013-08-15 LAB — FOLLICLE STIMULATING HORMONE: FSH: 3.7 m[IU]/mL (ref 1.4–18.1)

## 2013-08-15 NOTE — Telephone Encounter (Signed)
I called to discuss test result. Message left to call me back.

## 2013-08-15 NOTE — Telephone Encounter (Signed)
I discussed test result. Low testosterone,normal LH,FSH,may indicate secondary hypogonadism which warrants MRI if having symptoms like headache and blurry vision which he denies occuring often. I also mentioned to him we had checked total testosterone level which could be falsely low in an obese patient. I recommended checking free testosterone level to confirm his hypogonadism. If low we will need further work up depending on the symptoms he is presenting with. He verbalized understanding and agreed with plan.

## 2013-08-17 ENCOUNTER — Encounter: Payer: Self-pay | Admitting: Family Medicine

## 2013-08-18 ENCOUNTER — Other Ambulatory Visit: Payer: Self-pay | Admitting: Family Medicine

## 2013-08-18 DIAGNOSIS — D649 Anemia, unspecified: Secondary | ICD-10-CM

## 2013-08-21 ENCOUNTER — Other Ambulatory Visit: Payer: No Typology Code available for payment source

## 2013-08-21 DIAGNOSIS — D649 Anemia, unspecified: Secondary | ICD-10-CM

## 2013-08-21 DIAGNOSIS — E291 Testicular hypofunction: Secondary | ICD-10-CM

## 2013-08-21 LAB — CBC
MCH: 19.4 pg — ABNORMAL LOW (ref 26.0–34.0)
MCHC: 29.7 g/dL — ABNORMAL LOW (ref 30.0–36.0)
Platelets: 435 10*3/uL — ABNORMAL HIGH (ref 150–400)

## 2013-08-21 NOTE — Progress Notes (Signed)
F/T TESTOSTERONE AND CBC DONE TODAY [name]

## 2013-08-22 ENCOUNTER — Encounter: Payer: Self-pay | Admitting: Family Medicine

## 2013-08-22 LAB — TESTOSTERONE, FREE, TOTAL, SHBG
Sex Hormone Binding: 18 nmol/L (ref 13–71)
Testosterone, Free: 57.7 pg/mL (ref 47.0–244.0)

## 2013-09-05 ENCOUNTER — Encounter: Payer: Self-pay | Admitting: Family Medicine

## 2013-09-12 ENCOUNTER — Ambulatory Visit: Payer: No Typology Code available for payment source

## 2013-09-13 ENCOUNTER — Encounter: Payer: Self-pay | Admitting: Family Medicine

## 2013-09-14 ENCOUNTER — Telehealth: Payer: Self-pay | Admitting: Family Medicine

## 2013-09-14 NOTE — Telephone Encounter (Signed)
Patient [facility] off [facility] application form for testosterone gel. A copy of his signed authorization to disclose his medical information to Denyse Amassabbvie was reviewed by me and placed in front office for scanning into epic. I completed his form and also placed it in front office for faxing.

## 2013-09-14 NOTE — Telephone Encounter (Signed)
Pt is aware that forms were faxed. [name],CMA

## 2013-09-14 NOTE — Telephone Encounter (Signed)
Form completed.

## 2013-09-14 NOTE — Telephone Encounter (Signed)
Patient dropped off medication assistance papers to be filled out.   Please fax to company when completed.

## 2013-09-19 ENCOUNTER — Encounter: Payer: Self-pay | Admitting: Family Medicine

## 2013-09-29 ENCOUNTER — Other Ambulatory Visit: Payer: Self-pay | Admitting: Family Medicine

## 2013-09-29 MED ORDER — TESTOSTERONE 20.25 MG/1.25GM (1.62%) TD GEL: 20.0000 mg | Freq: Every morning | TRANSDERMAL | Status: DC

## 2013-09-29 MED ORDER — TESTOSTERONE 20.25 MG/1.25GM (1.62%) TD GEL: TRANSDERMAL | Status: DC

## 2013-09-29 NOTE — Telephone Encounter (Signed)
Script placed up front to be faxed to [employer]  FAX number [phone] at patient's request.

## 2013-10-04 ENCOUNTER — Telehealth: Payer: Self-pay | Admitting: Family Medicine

## 2013-10-04 ENCOUNTER — Other Ambulatory Visit: Payer: Self-pay | Admitting: Family Medicine

## 2013-10-04 NOTE — Telephone Encounter (Signed)
Refill correction for testosterone.

## 2013-10-04 NOTE — Telephone Encounter (Signed)
Pharm[facility]. They said that they way it is prescribed they can not fill. Please call to verify. Call [phone] and select option # 7. jw

## 2013-10-04 NOTE — Telephone Encounter (Signed)
I spoke with the pharmacist.Prescription quantity corrected and script placed up front for refill.

## 2013-10-05 ENCOUNTER — Encounter: Payer: Self-pay | Admitting: Family Medicine

## 2013-12-22 ENCOUNTER — Encounter (HOSPITAL_COMMUNITY): Payer: Self-pay | Admitting: Emergency Medicine

## 2013-12-22 ENCOUNTER — Emergency Department (HOSPITAL_COMMUNITY)
Admission: EM | Admit: 2013-12-22 | Discharge: 2013-12-22 | Disposition: A | Discharge status: Home / Self Care | Payer: No Typology Code available for payment source | Source: Home / Self Care | Attending: Family Medicine | Admitting: Family Medicine

## 2013-12-22 DIAGNOSIS — J45901 Unspecified asthma with (acute) exacerbation: Secondary | ICD-10-CM

## 2013-12-22 DIAGNOSIS — D509 Iron deficiency anemia, unspecified: Secondary | ICD-10-CM

## 2013-12-22 LAB — CBC
HCT: 40.4 % (ref 39.0–52.0)
Hemoglobin: 13.2 g/dL (ref 13.0–17.0)
MCH: 24.9 pg — ABNORMAL LOW (ref 26.0–34.0)
MCHC: 32.7 g/dL (ref 30.0–36.0)
MCV: 76.2 fL — AB (ref 78.0–100.0)
PLATELETS: 290 10*3/uL (ref 150–400)
RBC: 5.3 MIL/uL (ref 4.22–5.81)
RDW: 24 % — AB (ref 11.5–15.5)
WBC: 7.4 10*3/uL (ref 4.0–10.5)

## 2013-12-22 LAB — IRON AND TIBC
Iron: 41 ug/dL — ABNORMAL LOW (ref 42–135)
SATURATION RATIOS: 13 % — AB (ref 20–55)
TIBC: 308 ug/dL (ref 215–435)
UIBC: 267 ug/dL (ref 125–400)

## 2013-12-22 LAB — FERRITIN: Ferritin: 79 ng/mL (ref 22–322)

## 2013-12-22 MED ORDER — IPRATROPIUM-ALBUTEROL 0.5-2.5 (3) MG/3ML IN SOLN
RESPIRATORY_TRACT | Status: AC
  Filled 2013-12-22: qty 3

## 2013-12-22 MED ORDER — PREDNISONE 20 MG PO TABS
ORAL_TABLET | ORAL | Status: AC
  Filled 2013-12-22: qty 2

## 2013-12-22 MED ORDER — IPRATROPIUM-ALBUTEROL 0.5-2.5 (3) MG/3ML IN SOLN
3.0000 mL | Freq: Once | RESPIRATORY_TRACT | Status: AC
  Administered 2013-12-22: 3 mL via RESPIRATORY_TRACT

## 2013-12-22 MED ORDER — PREDNISONE 10 MG PO TABS: 30.0000 mg | ORAL_TABLET | Freq: Every day | ORAL | Status: DC

## 2013-12-22 MED ORDER — PREDNISONE 20 MG PO TABS
40.0000 mg | ORAL_TABLET | Freq: Once | ORAL | Status: AC
  Administered 2013-12-22: 40 mg via ORAL

## 2013-12-22 NOTE — ED Notes (Signed)
C/o asthma States around this time of the year he feels like this Denies any cold sx Has tried albuterol inhaler

## 2013-12-22 NOTE — ED Provider Notes (Signed)
Jeffery ArcherRobert W Rice is a 48 y.o. male who presents to Urgent Care today for asthma. Patient has had one week of worsening shortness of breath cough chest tightness and wheezing. This is consistent with prior episodes of asthma exacerbation. He attributes his symptoms to exposure to seasonal allergies. He has used albuterol which helps only temporarily.  Additionally patient is being treated for iron deficiency anemia with [facility]. He would like his iron checked today if possible.   Past Medical History  Diagnosis Date  . Anemia, iron deficiency   . Exertional dyspnea     secondary to anemia  . Depression   . Constipation   . ADD (attention deficit disorder)   . Hiatal hernia   . Depression   . Blood transfusion   . Asthma   . ADD (attention deficit disorder)    History  Substance Use Topics  . Smoking status: Never Smoker   . Smokeless tobacco: Never Used  . Alcohol Use: No   ROS as above Medications: No current facility-administered medications for this encounter.   Current Outpatient Prescriptions  Medication Sig Dispense Refill  . albuterol (PROVENTIL HFA;VENTOLIN HFA) 108 (90 BASE) MCG/ACT inhaler Inhale 2 puffs into the lungs every 6 (six) hours as needed for wheezing or shortness of breath.  1 Inhaler  3  . buPROPion (WELLBUTRIN) 100 MG tablet Take 200 mg by mouth daily.       . ferrous sulfate 325 (65 FE) MG tablet Take 1 tablet (325 mg total) by mouth 3 (three) times daily with meals.  90 tablet  3  . methylphenidate (CONCERTA) 54 MG CR tablet Take 54 mg by mouth every morning.      . methylphenidate (RITALIN) 10 MG tablet Take 10 mg by mouth daily.      Marland Kitchen. omeprazole (PRILOSEC) 20 MG capsule Take 1 capsule (20 mg total) by mouth daily.  30 capsule  3  . predniSONE (DELTASONE) 10 MG tablet Take 3 tablets (30 mg total) by mouth daily.  21 tablet  0  . sertraline (ZOLOFT) 50 MG tablet Take 50 mg by mouth daily.       . Testosterone  20.25 MG/1.25GM (1.62%) GEL Apply 20.25 every morning  2.5 g  3    Exam:  BP 123/87  Pulse 89  Temp(Src) 98.4 F (36.9 C) (Oral)  SpO2 97% Gen: Well NAD HEENT: EOMI,  MMM Lungs: Normal work of breathing. Wheezing present bilaterally Heart: RRR no MRG Abd: NABS, Soft. NT, ND Exts: Brisk capillary refill, warm and well perfused.   Patient was given a DuoNeb nebulizer treatment and felt much better  Results for orders placed during the hospital encounter of 12/22/13 (from the past 24 hour(s))  CBC     Status: Abnormal   Collection Time    12/22/13 10:32 AM      Result Value Ref Range   WBC 7.4  4.0 - 10.5 K/uL   RBC 5.30  4.22 - 5.81 MIL/uL   Hemoglobin 13.2  13.0 - 17.0 g/dL   HCT 16.140.4  09.639.0 - 04.552.0 %   MCV 76.2 (*) 78.0 - 100.0 fL   MCH 24.9 (*) 26.0 - 34.0 pg   MCHC 32.7  30.0 - 36.0 g/dL   RDW 40.924.0 (*) 81.111.5 - 91.415.5 %   Platelets 290  150 - 400 K/uL   No results found.  Assessment and Plan: 48 y.o. male with asthma exacerbation. Plan to treat with  prednisone and albuterol.  Anemia: CBC, ferritin, TIBC pending. Followup with primary care provider.  Discussed warning signs or symptoms. Please see discharge instructions. Patient expresses understanding.    Rodolph BongEvan S Chasey Dull, MD 12/22/13 [phone]

## 2013-12-22 NOTE — Discharge Instructions (Signed)
Thank you for coming in today. Take prednisone daily for 7 days starting tomorrow.  Use albuterol as needed.  Return to the emergency room if not improving or worsening.  Call or go to the emergency room if you get worse, have trouble breathing, have chest pains, or palpitations.   Asthma, Adult Asthma is a recurring condition in which the airways tighten and narrow. Asthma can make it difficult to breathe. It can cause coughing, wheezing, and shortness of breath. Asthma episodes (also called asthma attacks) range from minor to life-threatening. Asthma cannot be cured, but medicines and lifestyle changes can help control it. CAUSES Asthma is believed to be caused by inherited (genetic) and environmental factors, but its exact cause is unknown. Asthma may be triggered by allergens, lung infections, or irritants in the air. Asthma triggers are different for each person. Common triggers include:   Animal dander.  Dust mites.  Cockroaches.  Pollen from trees or grass.  Mold.  Smoke.  Air pollutants such as dust, household cleaners, hair sprays, aerosol sprays, paint fumes, strong chemicals, or strong odors.  Cold air, weather changes, and winds (which increase molds and pollens in the air).  Strong emotional expressions such as crying or laughing hard.  Stress.  Certain medicines (such as aspirin) or types of drugs (such as beta-blockers).  Sulfites in foods and drinks. Foods and drinks that may contain sulfites include dried fruit, potato chips, and sparkling grape juice.  Infections or inflammatory conditions such as the flu, a cold, or an inflammation of the nasal membranes (rhinitis).  Gastroesophageal reflux disease (GERD).  Exercise or strenuous activity. SYMPTOMS Symptoms may occur immediately after asthma is triggered or many hours later. Symptoms include:  Wheezing.  Excessive nighttime or early morning coughing.  Frequent or severe coughing with a common  cold.  Chest tightness.  Shortness of breath. DIAGNOSIS  The diagnosis of asthma is made by a review of your medical history and a physical exam. Tests may also be performed. These may include:  Lung function studies. These tests show how much air you breath in and out.  Allergy tests.  Imaging tests such as X-rays. TREATMENT  Asthma cannot be cured, but it can usually be controlled. Treatment involves identifying and avoiding your asthma triggers. It also involves medicines. There are 2 classes of medicine used for asthma treatment:   Controller medicines. These prevent asthma symptoms from occurring. They are usually taken every day.  Reliever or rescue medicines. These quickly relieve asthma symptoms. They are used as needed and provide short-term relief. Your health care provider will help you create an asthma action plan. An asthma action plan is a written plan for managing and treating your asthma attacks. It includes a list of your asthma triggers and how they may be avoided. It also includes information on when medicines should be taken and when their dosage should be changed. An action plan may also involve the use of a device called a peak flow meter. A peak flow meter measures how well the lungs are working. It helps you monitor your condition. HOME CARE INSTRUCTIONS   Take medicine as directed by your health care provider. Speak with your health care provider if you have questions about how or when to take the medicines.  Use a peak flow meter as directed by your health care provider. Record and keep track of readings.  Understand and use the action plan to help minimize or stop an asthma attack without needing to seek medical  care.  Control your home environment in the following ways to help prevent asthma attacks:  Do not smoke. Avoid being exposed to secondhand smoke.  Change your heating and air conditioning filter regularly.  Limit your use of fireplaces and wood  stoves.  Get rid of pests (such as roaches and mice) and their droppings.  Throw away plants if you see mold on them.  Clean your floors and dust regularly. Use unscented cleaning products.  Try to have someone else vacuum for you regularly. Stay out of rooms while they are being vacuumed and for a short while afterward. If you vacuum, use a dust mask from a hardware store, a double-layered or microfilter vacuum cleaner bag, or a vacuum cleaner with a HEPA filter.  Replace carpet with wood, tile, or vinyl flooring. Carpet can trap dander and dust.  Use allergy-proof pillows, mattress covers, and box spring covers.  Wash bed sheets and blankets every week in hot water and dry them in a dryer.  Use blankets that are made of polyester or cotton.  Clean bathrooms and kitchens with bleach. If possible, have someone repaint the walls in these rooms with mold-resistant paint. Keep out of the rooms that are being cleaned and painted.  Wash hands frequently. SEEK MEDICAL CARE IF:   You have wheezing, shortness of breath, or a cough even if taking medicine to prevent attacks.  The colored mucus you cough up (sputum) is thicker than usual.  Your sputum changes from clear or white to yellow, green, gray, or bloody.  You have any problems that may be related to the medicines you are taking (such as a rash, itching, swelling, or trouble breathing).  You are using a reliever medicine more than 2 3 times per week.  Your peak flow is still at 50 79% of you personal best after following your action plan for 1 hour. SEEK IMMEDIATE MEDICAL CARE IF:   You seem to be getting worse and are unresponsive to treatment during an asthma attack.  You are short of breath even at rest.  You get short of breath when doing very little physical activity.  You have difficulty eating, drinking, or talking due to asthma symptoms.  You develop chest pain.  You develop a fast heartbeat.  You have a bluish  color to your lips or fingernails.  You are lightheaded, dizzy, or faint.  Your peak flow is less than 50% of your personal best.  You have a fever or persistent symptoms for more than 2 3 days.  You have a fever and symptoms suddenly get worse. MAKE SURE YOU:   Understand these instructions.  Will watch your condition.  Will get help right away if you are not doing well or get worse. Document Released: 08/24/2005 Document Revised: 04/26/2013 Document Reviewed: 03/23/2013 [facility] Patient Information 2014 [employer] [place].

## 2013-12-25 NOTE — ED Notes (Signed)
Iron 41 L, TIBC 308, Sat ratio 13 L, UIBC 267, Ferritin 79.  Message sent to Dr. Denyse Amassorey. Desiree LucySuzanne M [facility] 12/25/2013

## 2014-01-17 ENCOUNTER — Telehealth (HOSPITAL_COMMUNITY): Payer: Self-pay | Admitting: *Deleted

## 2014-01-17 NOTE — ED Notes (Signed)
Pt. called and asked for a refill of the prednisone.  States he is using the Albuteral inhaler way to much. States his asthma is flared up again.   I told him I would ask and call back. Discussed with Dr. Lorenz CoasterKeller.  He said pt. would have to come back to be rechecked. I called pt. and told him this.  He said he does not have transportation. I suggested a taxi or bus. He said he does not have money for the taxi and would have to walk to far to get the bus.  I told him he could call EMS and go to the Ed. Desiree LucySuzanne M [facility] 01/17/2014

## 2014-01-18 ENCOUNTER — Encounter: Payer: Self-pay | Admitting: Family Medicine

## 2014-01-18 ENCOUNTER — Ambulatory Visit (INDEPENDENT_AMBULATORY_CARE_PROVIDER_SITE_OTHER): Payer: No Typology Code available for payment source | Admitting: Family Medicine

## 2014-01-18 VITALS — BP 151/92 | HR 93 | Temp 98.1°F | Wt 262.2 lb

## 2014-01-18 DIAGNOSIS — J45909 Unspecified asthma, uncomplicated: Secondary | ICD-10-CM

## 2014-01-18 MED ORDER — PREDNISONE 20 MG PO TABS: 20.0000 mg | ORAL_TABLET | Freq: Every day | ORAL | Status: DC

## 2014-01-18 MED ORDER — IPRATROPIUM BROMIDE 0.02 % IN SOLN
0.5000 mg | Freq: Once | RESPIRATORY_TRACT | Status: AC
  Administered 2014-01-18: 0.5 mg via RESPIRATORY_TRACT

## 2014-01-18 MED ORDER — ALBUTEROL SULFATE (2.5 MG/3ML) 0.083% IN NEBU
2.5000 mg | INHALATION_SOLUTION | Freq: Once | RESPIRATORY_TRACT | Status: AC
  Administered 2014-01-18: 2.5 mg via RESPIRATORY_TRACT

## 2014-01-18 NOTE — Patient Instructions (Signed)
Prednisone: Take 20mg  for 3 days. Then take 10mg  for 2 days. Save the rest of the pills for a future exacerbation. If you are still not improved after the weekend schedule an appointment for that week.  I will discuss with the pharmacist about the samples of Dulera.

## 2014-01-21 NOTE — Assessment & Plan Note (Signed)
Currently in exacerbation. Improved with duonebs in office. Short course steroids, asked patient to follow up in clinic next week. From history and exam I think he needs a controller medication at least during spring/summer. Will send a message to Dr. Raymondo BandKoval to see if there are any samples of medications he could use as he does not have insurance and pays for all of his medications out of pocket.

## 2014-01-21 NOTE — Progress Notes (Signed)
Patient ID: [name], male   DOB: [birth year], 48 y.o.   MRN: [id number]   Subjective:    Patient ID: Jeffery Archerobert W Rice, male    DOB: [birth year], 48 y.o.   MRN: [id number]  HPI  CC: Asthma flare  # Asthma:  Symptoms worse in spring/summer. Went to Urgent care 4 weeks ago for similar flare that was improved with duonebs and short course steroids  Breathing got much worse yesterday, still feeling it today but improved  Bought inhaler 2 weeks ago, has 65 actuations left. Pays out of pocket ($56)  Does not have a controller medication  Never hospitalized as an adult for asthma, thinks maybe as a child.  Doesn't know his triggers, though says it is worse in spring and summer, so suspect it may be seasonal allergies ROS: Denies fever, chills, CP, abdominal pain, runny nose, does have some cough  Review of Systems   See HPI for ROS. Objective:  BP 151/92  Pulse 93  Temp(Src) 98.1 F (36.7 C) (Oral)  Wt 262 lb 3.2 oz (118.933 kg)  SpO2 93%  General: NAD Cardiac: RRR, normal heart sounds, no murmurs. 2+ radial and PT pulses bilaterally Respiratory: diffuse inspiratory/expiratory wheeze, overall musical. Normal effort, no use of accessory muscles Neuro: alert and oriented, no focal deficits     Assessment & Plan:  See Problem List Documentation

## 2014-01-23 ENCOUNTER — Encounter: Payer: Self-pay | Admitting: Family Medicine

## 2014-01-24 ENCOUNTER — Other Ambulatory Visit: Payer: Self-pay | Admitting: Family Medicine

## 2014-01-24 MED ORDER — MOMETASONE FURO-FORMOTEROL FUM 200-5 MCG/ACT IN AERO: 2.0000 | INHALATION_SPRAY | Freq: Two times a day (BID) | RESPIRATORY_TRACT | Status: DC

## 2014-03-06 ENCOUNTER — Encounter: Payer: Self-pay | Admitting: Family Medicine

## 2014-03-07 ENCOUNTER — Ambulatory Visit (INDEPENDENT_AMBULATORY_CARE_PROVIDER_SITE_OTHER): Payer: No Typology Code available for payment source | Admitting: Family Medicine

## 2014-03-07 ENCOUNTER — Encounter: Payer: Self-pay | Admitting: Family Medicine

## 2014-03-07 VITALS — BP 131/77 | HR 99 | Temp 97.7°F | Ht 71.0 in | Wt 264.8 lb

## 2014-03-07 DIAGNOSIS — E291 Testicular hypofunction: Secondary | ICD-10-CM

## 2014-03-07 DIAGNOSIS — R5381 Other malaise: Secondary | ICD-10-CM

## 2014-03-07 DIAGNOSIS — D509 Iron deficiency anemia, unspecified: Secondary | ICD-10-CM

## 2014-03-07 DIAGNOSIS — R5382 Chronic fatigue, unspecified: Secondary | ICD-10-CM

## 2014-03-07 DIAGNOSIS — G479 Sleep disorder, unspecified: Secondary | ICD-10-CM | POA: Insufficient documentation

## 2014-03-07 DIAGNOSIS — R5383 Other fatigue: Principal | ICD-10-CM

## 2014-03-07 DIAGNOSIS — R7989 Other specified abnormal findings of blood chemistry: Secondary | ICD-10-CM

## 2014-03-07 LAB — CBC WITH DIFFERENTIAL/PLATELET
Basophils Absolute: 0.1 10*3/uL (ref 0.0–0.1)
Basophils Relative: 1 % (ref 0–1)
EOS ABS: 0.5 10*3/uL (ref 0.0–0.7)
Eosinophils Relative: 6 % — ABNORMAL HIGH (ref 0–5)
HEMATOCRIT: 40.5 % (ref 39.0–52.0)
Hemoglobin: 13.8 g/dL (ref 13.0–17.0)
LYMPHS ABS: 1.1 10*3/uL (ref 0.7–4.0)
LYMPHS PCT: 14 % (ref 12–46)
MCH: 26.3 pg (ref 26.0–34.0)
MCHC: 34.1 g/dL (ref 30.0–36.0)
MCV: 77.3 fL — ABNORMAL LOW (ref 78.0–100.0)
MONO ABS: 0.7 10*3/uL (ref 0.1–1.0)
Monocytes Relative: 9 % (ref 3–12)
Neutro Abs: 5.7 10*3/uL (ref 1.7–7.7)
Neutrophils Relative %: 70 % (ref 43–77)
Platelets: 309 10*3/uL (ref 150–400)
RBC: 5.24 MIL/uL (ref 4.22–5.81)
RDW: 14.2 % (ref 11.5–15.5)
WBC: 8.1 10*3/uL (ref 4.0–10.5)

## 2014-03-07 NOTE — Assessment & Plan Note (Signed)
Likely multifactorial: anemia (though last Hgb appeared normal), deconditioning, with suspicion for OSA based on history today. P: repeat labs including CBC, TSH, testosterone level. Called and [facility] will cover sleep study with orange card, order placed.

## 2014-03-07 NOTE — Progress Notes (Signed)
Patient ID: [name], male   DOB: 08-26-1966, 48 y.o.   MRN: [id number]   Subjective:    Patient ID: Jeffery Archerobert W Rice, male    DOB: 08-26-1966, 48 y.o.   MRN: [id number]  HPI  CC: Fatigue, anemia follow up  # Fatigue/anemia:  Long standing problem, found to be anemic last year and [facility], receives infusions every 4-5 months. He feels better for a while after these infusions. Last Hgb in early June was 13.  Also mentions he has been diagnosed with low testosterone, takes androgel 1.62%.  Denies any recent illnesses  More exercise over the past several months, walks for 30 minutes twice a day a few days a week.  Says he is having some "mental block" or "mental fatigue" as well, says it is different than his usual ADHD; will pause and have to think about what he was doing or saying (happening more frequently). ROS: denies hair loss, has heat intolerance, no weight loss/gain, fast heart rate (improves with iron infusion).  # Sleep  reports snoring, never waking up feeling rested (example he gives says he will sleep 10 hours, get up drink coffee and walk around but still be able to fall back asleep in 10 minutes if he lays down), tired throughout the day  Review of Systems   See HPI for ROS. Objective:  BP 131/77  Pulse 99  Temp(Src) 97.7 F (36.5 C) (Oral)  Ht 5\' 11"  (1.803 m)  Wt 264 lb 12.8 oz (120.112 kg)  BMI 36.95 kg/m2  General: NAD HEENT: PERRL, EOMI. No thyromegaly or nodules appreciated. Cardiac: RRR (borderline tachycardic), normal heart sounds, no murmurs. 2+ radial and PT pulses bilaterally Respiratory: CTAB, normal effort Extremities: no edema or cyanosis. WWP. Skin: warm and dry, no rashes noted    Assessment & Plan:  See Problem List Documentation

## 2014-03-07 NOTE — Assessment & Plan Note (Signed)
On androgel 1.62%, will re-check level today to see if possibly contributing to current fatigue and for routine monitoring.

## 2014-03-07 NOTE — Patient Instructions (Signed)
It was good to see today.  Keep up the good work with the exercising.   Labs to check CBC, TSH, testosterone today.

## 2014-03-08 ENCOUNTER — Encounter: Payer: Self-pay | Admitting: Family Medicine

## 2014-03-08 LAB — TSH: TSH: 1.543 u[IU]/mL (ref 0.350–4.500)

## 2014-03-08 LAB — TESTOSTERONE: Testosterone: 183 ng/dL — ABNORMAL LOW (ref 300–890)

## 2014-03-12 ENCOUNTER — Telehealth: Payer: Self-pay | Admitting: Family Medicine

## 2014-03-12 ENCOUNTER — Encounter: Payer: Self-pay | Admitting: *Deleted

## 2014-03-12 DIAGNOSIS — R5382 Chronic fatigue, unspecified: Secondary | ICD-10-CM

## 2014-03-12 NOTE — Telephone Encounter (Signed)
Patient contacted me via epic email to discuss his result. His Hgb is normal, TSH is normal, testosterone is low despite androgel. I called and discussed result with his, he stated he still feels very fatigue during the day and does not get good sleep at night due to sleep apnea symptoms. He is concern why his Testosterone is still low despite Androlgel, I discussed the possibility of him forming excess estrogen from his obesity, weight loss recommended. I also brought to his attention all possible s/e of Androlgel especially causing or worsening sleep apnea. According to documentation he has sleep study schedule. I recommended possibly discontinuing testosterone since it is not helping and potentially causing s/e. He stated he will like to be seen by endocrinologist, but referral difficulty due to lack of insurance. He stated [facility] to see if they will see him. I will send his referral if he is accepted.  I tried to discuss other medication he is on that could cause fatigue or insomnia such as  Concerta and Ritalin, patient had to cut off the telephone line due to cost, hence I told him I will send this back to him via email. He voiced concern of dying due to his fatigue but not related to suicidal ideation. He stated he continues to use his antidepressant regularly and follow up regularly with his Psych. He will also benefit from ECHO to r/o chronic CHF as a cause of his fatigue and sleeplessness at night presenting as OSA symptoms. I will email him to schedule appointment for ECHO.

## 2014-03-13 ENCOUNTER — Telehealth: Payer: Self-pay | Admitting: Family Medicine

## 2014-03-13 ENCOUNTER — Encounter: Payer: Self-pay | Admitting: Family Medicine

## 2014-03-13 NOTE — Telephone Encounter (Signed)
Pt called and would like Dr. Lum BabeEniola to call him because his orange card expired and he has an appointment on 7/8 for an Echo. He doesn't know what to do. He has been told he can still go to the appointment and try to get the fees retro-active. jw

## 2014-03-13 NOTE — Telephone Encounter (Signed)
LM for patient to call back.  Please inform him that he will need to call and cancel his echo if he doesn't want to be billed for this.  [facility] not retro pay.  We can cancel and reschedule this when his card is active again.  Please have him call and LM for Britta Mccreedybarbara to make an appt to have this done.  Thanks Limited BrandsJazmin Hartsell,CMA

## 2014-03-14 ENCOUNTER — Ambulatory Visit (HOSPITAL_COMMUNITY): Payer: Self-pay

## 2014-03-27 ENCOUNTER — Encounter: Payer: Self-pay | Admitting: *Deleted

## 2014-03-27 ENCOUNTER — Encounter: Payer: Self-pay | Admitting: Family Medicine

## 2014-03-27 ENCOUNTER — Other Ambulatory Visit: Payer: Self-pay | Admitting: Family Medicine

## 2014-03-27 DIAGNOSIS — E291 Testicular hypofunction: Secondary | ICD-10-CM

## 2014-04-13 ENCOUNTER — Ambulatory Visit: Payer: No Typology Code available for payment source

## 2014-04-27 ENCOUNTER — Encounter: Payer: Self-pay | Admitting: Family Medicine

## 2014-04-30 ENCOUNTER — Ambulatory Visit (HOSPITAL_BASED_OUTPATIENT_CLINIC_OR_DEPARTMENT_OTHER): Payer: No Typology Code available for payment source | Attending: Family Medicine | Admitting: Radiology

## 2014-04-30 VITALS — Ht 71.0 in | Wt 265.0 lb

## 2014-04-30 DIAGNOSIS — G479 Sleep disorder, unspecified: Secondary | ICD-10-CM

## 2014-04-30 DIAGNOSIS — R5381 Other malaise: Secondary | ICD-10-CM

## 2014-04-30 DIAGNOSIS — G4733 Obstructive sleep apnea (adult) (pediatric): Secondary | ICD-10-CM

## 2014-04-30 DIAGNOSIS — R5383 Other fatigue: Secondary | ICD-10-CM

## 2014-05-05 DIAGNOSIS — G4733 Obstructive sleep apnea (adult) (pediatric): Secondary | ICD-10-CM

## 2014-05-05 NOTE — Sleep Study (Signed)
   NAME: [name] DATE OF BIRTH:  07/06/1966 MEDICAL RECORD NUMBER [id number]  LOCATION: [facility]  PHYSICIAN: YOUNG,CLINTON D  DATE OF STUDY: 04/30/2014  SLEEP STUDY TYPE: Nocturnal Polysomnogram               REFERRING PHYSICIAN: Barbaraann Barthel, MD  INDICATION FOR STUDY: Hypersomnia with sleep apnea  EPWORTH SLEEPINESS SCORE:   18/24 HEIGHT:  (180.3 cm)  WEIGHT: 265 lb (120.203 kg)    Body mass index is 36.98 kg/(m^2).  NECK SIZE: 18 in.  MEDICATIONS: Charted for review  SLEEP ARCHITECTURE: Split study protocol. During the diagnostic phase, total sleep time 121 minutes with sleep efficiency 59.6%. Stage I was 5.4%, stage II 94.6%, stage III and REM were absent. Sleep latency 57 minutes, awake after sleep onset 25 minutes, arousal index 14.9, bedtime dictation: None  RESPIRATORY DATA: Apnea hypopneas index (AHI) 16.9 per hour. 34 total events scored including 9 obstructive apneas, 1 mixed apnea, 24 hypopneas. Events were more common while supine. CPAP was titrated to 14 CWP, AHI 0.6 per hour. He wore a medium fullface mask.  OXYGEN DATA: Moderately loud snoring before CPAP with oxygen desaturation to a nadir at 86% on room air. With CPAP control, snoring was prevented and mean oxygen saturation was 94.7% on room air.  CARDIAC DATA: Sinus rhythm with PACs  MOVEMENT/PARASOMNIA: No significant movement disturbance, bathroom x1  IMPRESSION/ RECOMMENDATION:   1) Moderate obstructive sleep apnea/hypopneas syndrome, AHI 16.9 per hour with events more common while supine. Moderately loud snoring with oxygen desaturation to a nadir of 86% on room. 2) Successful CPAP titration to 14 CWP, AHI 0.6 per hour. He wore a medium ResMed AirFit F10 fullface mask with heated humidifier. Snoring was prevented and mean oxygen saturation was 94.7% on room air.  Waymon Budge Diplomate, American Board of Sleep Medicine  ELECTRONICALLY SIGNED ON:  05/05/2014, 10:12  AM [facility] PH: [phone]   FX: [phone] ACCREDITED BY THE AMERICAN ACADEMY OF SLEEP MEDICINE

## 2014-05-07 ENCOUNTER — Ambulatory Visit (HOSPITAL_COMMUNITY)
Admission: RE | Admit: 2014-05-07 | Discharge: 2014-05-07 | Disposition: A | Discharge status: Home / Self Care | Payer: No Typology Code available for payment source | Source: Ambulatory Visit | Attending: Family Medicine | Admitting: Family Medicine

## 2014-05-07 DIAGNOSIS — R5382 Chronic fatigue, unspecified: Secondary | ICD-10-CM

## 2014-05-07 DIAGNOSIS — D649 Anemia, unspecified: Secondary | ICD-10-CM | POA: Insufficient documentation

## 2014-05-07 DIAGNOSIS — R0989 Other specified symptoms and signs involving the circulatory and respiratory systems: Secondary | ICD-10-CM

## 2014-05-07 DIAGNOSIS — R5383 Other fatigue: Principal | ICD-10-CM

## 2014-05-07 DIAGNOSIS — R0609 Other forms of dyspnea: Secondary | ICD-10-CM | POA: Insufficient documentation

## 2014-05-07 DIAGNOSIS — R5381 Other malaise: Secondary | ICD-10-CM | POA: Insufficient documentation

## 2014-05-07 NOTE — Progress Notes (Signed)
  Echocardiogram 2D Echocardiogram has been performed.  [name] [name] 05/07/2014, 12:25 PM 

## 2014-05-08 ENCOUNTER — Encounter: Payer: Self-pay | Admitting: Family Medicine

## 2014-05-10 ENCOUNTER — Encounter (HOSPITAL_BASED_OUTPATIENT_CLINIC_OR_DEPARTMENT_OTHER): Payer: Self-pay | Admitting: *Deleted

## 2014-05-10 ENCOUNTER — Other Ambulatory Visit: Payer: Self-pay | Admitting: Family Medicine

## 2014-05-10 ENCOUNTER — Encounter: Payer: Self-pay | Admitting: Family Medicine

## 2014-05-10 DIAGNOSIS — G4733 Obstructive sleep apnea (adult) (pediatric): Secondary | ICD-10-CM

## 2014-05-10 NOTE — Telephone Encounter (Signed)
Hi Rolando,  I have researched the breakdown for you. Dr. Barbaraann Barthel is the physician on record for having ordered the sleep study. His office is therefore the responsible party for coordinating DME. Our report indicates a successful titration, recommended mask parameters, and CPAP settings. This is all that is needed for a physician to write a DME prescription. This could come from either Dr. Mauricio Po or Dr. Lum Babe.  Dr. Maple Hudson is an interpretation physician. He does provide sleep management, but you would have to schedule an office consult with his practice at [facility] if you would like for him to take over your care.  Best Regards, Theodoro Grist

## 2014-05-17 ENCOUNTER — Encounter: Payer: Self-pay | Admitting: Family Medicine

## 2014-05-22 ENCOUNTER — Other Ambulatory Visit: Payer: Self-pay | Admitting: Family Medicine

## 2014-05-22 DIAGNOSIS — R5381 Other malaise: Secondary | ICD-10-CM

## 2014-05-22 DIAGNOSIS — R5383 Other fatigue: Secondary | ICD-10-CM

## 2014-05-22 DIAGNOSIS — G4733 Obstructive sleep apnea (adult) (pediatric): Secondary | ICD-10-CM

## 2014-05-22 NOTE — Progress Notes (Signed)
I reordered patient's CPAP, copy printed and fax with prescription to               [facility] [phone].

## 2014-05-24 ENCOUNTER — Encounter: Payer: Self-pay | Admitting: Family Medicine

## 2014-05-24 ENCOUNTER — Telehealth: Payer: Self-pay | Admitting: Clinical

## 2014-05-24 NOTE — Telephone Encounter (Signed)
CSW left a vm for pt informing him that a Cipap would not be covered with the orange card. CSW also requesting for pt to contact CSW back to determine whether pt would like for PCP to complete an application for a Cipap through the Sleep Apnea Association.  Theresia Bough, MSW, LCSW [phone]

## 2014-05-29 ENCOUNTER — Encounter: Payer: Self-pay | Admitting: Family Medicine

## 2014-05-29 ENCOUNTER — Ambulatory Visit (INDEPENDENT_AMBULATORY_CARE_PROVIDER_SITE_OTHER): Payer: No Typology Code available for payment source | Admitting: Family Medicine

## 2014-05-29 VITALS — BP 125/87 | HR 93 | Temp 98.2°F | Wt 261.5 lb

## 2014-05-29 DIAGNOSIS — R7989 Other specified abnormal findings of blood chemistry: Secondary | ICD-10-CM

## 2014-05-29 DIAGNOSIS — G4733 Obstructive sleep apnea (adult) (pediatric): Secondary | ICD-10-CM | POA: Insufficient documentation

## 2014-05-29 DIAGNOSIS — R5382 Chronic fatigue, unspecified: Secondary | ICD-10-CM

## 2014-05-29 DIAGNOSIS — R5381 Other malaise: Secondary | ICD-10-CM

## 2014-05-29 DIAGNOSIS — E291 Testicular hypofunction: Secondary | ICD-10-CM

## 2014-05-29 DIAGNOSIS — R5383 Other fatigue: Secondary | ICD-10-CM

## 2014-05-29 NOTE — Patient Instructions (Signed)
It was nice seeing you today, as discussed with you prior to starting Provigil you need to get off Concerta and Ritalin, these two medication need to be taken off gradually with adequate monitoring by your psychiatrist since this might worsen your depression, once cleared by Psych we can start your Provigil. I will see you in about 4 wks.

## 2014-05-29 NOTE — Progress Notes (Signed)
Subjective:     Patient ID: [name], male   DOB: [birth year], 48 y.o.   MRN: [id number]  HPI WUJ:[id number] for follow up after sleep study. Fatigue: Here to discuss starting Provigil for daytime sleepiness. Hypogonadism:Here for follow up, he was taking his testosterone up until 1 wk ago, he is out of medicine and did not ask for refill since he feels this is not working much,he stated it worked well when he started the medication.  Current Outpatient Prescriptions on File Prior to Visit  Medication Sig Dispense Refill  . albuterol (PROVENTIL HFA;VENTOLIN HFA) 108 (90 BASE) MCG/ACT inhaler Inhale 2 puffs into the lungs every 6 (six) hours as needed for wheezing or shortness of breath.  1 Inhaler  3  . buPROPion (WELLBUTRIN) 100 MG tablet Take 100 mg by mouth daily.       . methylphenidate (CONCERTA) 54 MG CR tablet Take 54 mg by mouth every morning.      . methylphenidate (RITALIN) 10 MG tablet Take 10 mg by mouth daily.      . sertraline (ZOLOFT) 50 MG tablet Take 50 mg by mouth daily.       . ferrous sulfate 325 (65 FE) MG tablet Take 1 tablet (325 mg total) by mouth 3 (three) times daily with meals.  90 tablet  3  . mometasone-formoterol (DULERA) 200-5 MCG/ACT AERO Inhale 2 puffs into the lungs 2 (two) times daily.      Marland Kitchen omeprazole (PRILOSEC) 20 MG capsule Take 1 capsule (20 mg total) by mouth daily.  30 capsule  3  . Testosterone 20.25 MG/1.25GM (1.62%) GEL Apply 20.25 every morning  2.5 g  3   No current facility-administered medications on file prior to visit.   Past Medical History  Diagnosis Date  . Anemia, iron deficiency   . Exertional dyspnea     secondary to anemia  . Depression   . Constipation   . ADD (attention deficit disorder)   . Hiatal hernia   . Depression   . Blood transfusion   . Asthma   . ADD (attention deficit disorder)       Review of Systems  Constitutional: Positive for fatigue.  Respiratory: Negative.  Negative for wheezing.   Cardiovascular:  Negative.   Gastrointestinal: Negative.   Psychiatric/Behavioral: Positive for sleep disturbance. The patient is not nervous/anxious.        Day time sleepiness  All other systems reviewed and are negative.      Filed Vitals:   05/29/14 0825  BP: 125/87  Pulse: 93  Temp: 98.2 F (36.8 C)  TempSrc: Oral  Weight: 261 lb 8 oz (118.616 kg)    Objective:   Physical Exam  Nursing note and vitals reviewed. Constitutional: He is oriented to person, place, and time. He appears well-developed. No distress.  Cardiovascular: Normal rate, regular rhythm and normal heart sounds.   No murmur heard. Pulmonary/Chest: Effort normal and breath sounds normal. No respiratory distress. He has no wheezes. He exhibits no tenderness.  Abdominal: Soft. Bowel sounds are normal. He exhibits no distension and no mass. There is no tenderness.  Musculoskeletal: Normal range of motion. He exhibits no edema and no tenderness.  Neurological: He is alert and oriented to person, place, and time. No cranial nerve deficit.  Psychiatric: He has a normal mood and affect. His behavior is normal. Judgment and thought content normal.       Assessment:     OSA Fatgue Hypogonadism  Plan:     Check problem list.

## 2014-05-29 NOTE — Assessment & Plan Note (Signed)
Might be related to his OSA. He insisted on starting Provigil. As discussed with him, I will not have him on Provigil , concerta and Ritalin together. Patient advised to discuss with his Psyc need to be off medication. He will need monitoring of his Depression when off Concerta and Ritalin. He agreed with plan.

## 2014-05-29 NOTE — Assessment & Plan Note (Signed)
Sleep study report reviewed and discussed with patient. I completed his CPAP assistant form and he signed his portion. Setting per sleep study report was written on his form. I will contact him when his CPAP is available for pick up.

## 2014-05-29 NOTE — Assessment & Plan Note (Signed)
Testosterone checked. PSA, CMET,CBC checked since he has chronically been on testosterone. I will call him with result.

## 2014-05-30 ENCOUNTER — Encounter: Payer: Self-pay | Admitting: Family Medicine

## 2014-05-30 LAB — COMPLETE METABOLIC PANEL WITH GFR
ALK PHOS: 97 U/L (ref 39–117)
ALT: 17 U/L (ref 0–53)
AST: 16 U/L (ref 0–37)
Albumin: 4.2 g/dL (ref 3.5–5.2)
BILIRUBIN TOTAL: 0.2 mg/dL (ref 0.2–1.2)
BUN: 24 mg/dL — AB (ref 6–23)
CO2: 21 meq/L (ref 19–32)
CREATININE: 1.26 mg/dL (ref 0.50–1.35)
Calcium: 9.6 mg/dL (ref 8.4–10.5)
Chloride: 105 mEq/L (ref 96–112)
GFR, EST AFRICAN AMERICAN: 77 mL/min
GFR, EST NON AFRICAN AMERICAN: 67 mL/min
GLUCOSE: 99 mg/dL (ref 70–99)
Potassium: 4.2 mEq/L (ref 3.5–5.3)
SODIUM: 139 meq/L (ref 135–145)
Total Protein: 6.8 g/dL (ref 6.0–8.3)

## 2014-05-30 LAB — PSA, TOTAL AND FREE
PSA, Free Pct: 13 % — ABNORMAL LOW (ref >25)
PSA: 0.68 ng/mL (ref <=4.00)

## 2014-05-30 LAB — LIPID PANEL
CHOL/HDL RATIO: 4.2 ratio
CHOLESTEROL: 199 mg/dL (ref 0–200)
HDL: 47 mg/dL (ref >39)
LDL Cholesterol: 126 mg/dL — ABNORMAL HIGH (ref 0–99)
TRIGLYCERIDES: 130 mg/dL (ref <150)
VLDL: 26 mg/dL (ref 0–40)

## 2014-05-30 LAB — CBC WITH DIFFERENTIAL/PLATELET
Basophils Absolute: 0.1 10*3/uL (ref 0.0–0.1)
Basophils Relative: 1 % (ref 0–1)
EOS PCT: 6 % — AB (ref 0–5)
Eosinophils Absolute: 0.4 10*3/uL (ref 0.0–0.7)
HEMATOCRIT: 42.7 % (ref 39.0–52.0)
Hemoglobin: 14.1 g/dL (ref 13.0–17.0)
LYMPHS ABS: 1.1 10*3/uL (ref 0.7–4.0)
Lymphocytes Relative: 17 % (ref 12–46)
MCH: 26.4 pg (ref 26.0–34.0)
MCHC: 33 g/dL (ref 30.0–36.0)
MCV: 80 fL (ref 78.0–100.0)
Monocytes Absolute: 0.6 10*3/uL (ref 0.1–1.0)
Monocytes Relative: 9 % (ref 3–12)
NEUTROS ABS: 4.4 10*3/uL (ref 1.7–7.7)
Neutrophils Relative %: 67 % (ref 43–77)
Platelets: 317 10*3/uL (ref 150–400)
RBC: 5.34 MIL/uL (ref 4.22–5.81)
RDW: 14.5 % (ref 11.5–15.5)
WBC: 6.5 10*3/uL (ref 4.0–10.5)

## 2014-05-30 LAB — TESTOSTERONE: TESTOSTERONE: 172 ng/dL — AB (ref 300–890)

## 2014-05-31 ENCOUNTER — Telehealth: Payer: Self-pay | Admitting: Family Medicine

## 2014-05-31 NOTE — Telephone Encounter (Signed)
Please check to ensure he still uses same pharmacy on record so I can send in his refill.

## 2014-05-31 NOTE — Telephone Encounter (Signed)
Pt called and was needs a refill on his Androgel called in. jw

## 2014-06-01 ENCOUNTER — Other Ambulatory Visit: Payer: Self-pay | Admitting: Family Medicine

## 2014-06-01 MED ORDER — TESTOSTERONE 20.25 MG/1.25GM (1.62%) TD GEL: TRANSDERMAL | Status: DC

## 2014-06-01 NOTE — Telephone Encounter (Signed)
Copied from Email:  I sent that information on 05-29-2014.  Here is again:  "Here is the information for Androgel refill:  Fax prescription to :[phone]  Include my patient I.D. : [id number]"  Thank you,  Vicente Masson

## 2014-06-01 NOTE — Telephone Encounter (Signed)
Faxed. [name]  

## 2014-06-01 NOTE — Telephone Encounter (Signed)
Donnella Sham is covering for me, please have him print and fax prescription. Thanks. Let patient know I am on vacation.

## 2014-06-01 NOTE — Telephone Encounter (Signed)
Prescription for andorgel pump (Dispense 1 pump, 2 refills) given to nursing staff to fax.

## 2014-06-08 ENCOUNTER — Encounter: Payer: Self-pay | Admitting: Family Medicine

## 2014-06-11 ENCOUNTER — Telehealth: Payer: Self-pay | Admitting: Family Medicine

## 2014-06-11 NOTE — Telephone Encounter (Signed)
I am covering Dr. Phebe CollaEniola's box in her absence.  Please call Mr. Rodena MedinHodgin and let him know:  1) we can call in her DEA number (would you mind doing this?) 2) Dr. Lum BabeEniola can address the dulera sample when she return.  Thanks,  JW

## 2014-06-15 ENCOUNTER — Encounter: Payer: Self-pay | Admitting: Family Medicine

## 2014-06-21 ENCOUNTER — Encounter: Payer: Self-pay | Admitting: Family Medicine

## 2014-06-25 ENCOUNTER — Encounter: Payer: Self-pay | Admitting: Family Medicine

## 2014-07-27 ENCOUNTER — Ambulatory Visit (INDEPENDENT_AMBULATORY_CARE_PROVIDER_SITE_OTHER): Payer: No Typology Code available for payment source | Admitting: Family Medicine

## 2014-07-27 VITALS — BP 134/91 | HR 102 | Ht 71.0 in | Wt 265.0 lb

## 2014-07-27 DIAGNOSIS — G4710 Hypersomnia, unspecified: Secondary | ICD-10-CM

## 2014-07-27 DIAGNOSIS — R400 Somnolence: Secondary | ICD-10-CM

## 2014-07-29 DIAGNOSIS — R400 Somnolence: Secondary | ICD-10-CM | POA: Insufficient documentation

## 2014-07-29 NOTE — Progress Notes (Signed)
   Subjective:    Patient ID: Jeffery ArcherRobert W Rice, male    DOB: 06-Nov-1965, 48 y.o.   MRN: [id number]  HPI  CC: prescription request  # Daytime somnolence:  Requesting prescription for Nuvigil. Previously requested this via [employer] messaging with PCP.  States he has to play his day around getting naps in, finds himself falling sleep in class, specifically cites one instance of falling asleep while on the bus that scared him when he woke up  Had sleep study done showing he had sleep apnea, currently on CPAP therapy  Believes he has episodes in the past where he fell asleep suddenly, including one where he was laughing  Stopped taking his ritalin and concerta about 2 weeks ago in anticipation of doing a 30 day trial for nuvigil ROS: no CP, no worsened SOB (asthma at baseline).   Review of Systems   See HPI for ROS. All other systems reviewed and are negative.  Past medical history, surgical, family, and social history reviewed and updated in the EMR as appropriate. Objective:  BP 134/91 mmHg  Pulse 102  Ht 5\' 11"  (1.803 m)  Wt 265 lb (120.203 kg)  BMI 36.98 kg/m2 Vitals reviewed  General: NAD, pleasant Psych: mood "normal", speech normal prosody and tone.  Assessment & Plan:  See Problem List Documentation

## 2014-07-29 NOTE — Assessment & Plan Note (Signed)
Pt requesting nuvigil prescription. He has multiple issues contributing to this problem: anxiety/depression, OSA, anemia. He has taken himself off the ADHD medications currently. Discussed with him that I would speak to his PCP regarding this medication, as I would not be managing it long-term. Will contact him next week after this discussion takes place.

## 2014-07-30 ENCOUNTER — Other Ambulatory Visit: Payer: Self-pay | Admitting: Family Medicine

## 2014-07-30 ENCOUNTER — Other Ambulatory Visit (INDEPENDENT_AMBULATORY_CARE_PROVIDER_SITE_OTHER): Payer: No Typology Code available for payment source

## 2014-07-30 ENCOUNTER — Telehealth: Payer: Self-pay | Admitting: Family Medicine

## 2014-07-30 ENCOUNTER — Encounter: Payer: Self-pay | Admitting: Family Medicine

## 2014-07-30 DIAGNOSIS — G4719 Other hypersomnia: Secondary | ICD-10-CM

## 2014-07-30 DIAGNOSIS — Z862 Personal history of diseases of the blood and blood-forming organs and certain disorders involving the immune mechanism: Secondary | ICD-10-CM

## 2014-07-30 LAB — POCT HEMOGLOBIN: HEMOGLOBIN: 14.8 g/dL (ref 14.1–18.1)

## 2014-07-30 NOTE — Telephone Encounter (Signed)
Please check with patient what lab is is talking about, I did ask for him to come in for labs

## 2014-07-30 NOTE — Telephone Encounter (Signed)
Pt is here now for labs.  What labs are you wanting him to have? Omeka Holben,CMA

## 2014-07-30 NOTE — Progress Notes (Signed)
Pt came for hemoglobin = 14.8 g/dL   Capillary sample,   Hemoglobin performed on the Hemocue Analyzer

## 2014-07-30 NOTE — Telephone Encounter (Signed)
I am not sure what lab he is talking about, please ask him to specify what lab. I am off this afternoon.

## 2014-07-30 NOTE — Telephone Encounter (Signed)
Who asked patient to come for lab?

## 2014-07-30 NOTE — Progress Notes (Signed)
Hello Blue team,  Please call to check with patient if he will need referral to neurologist/sleep specialist for his sleep problem. We don't prescribe Nuvigil, let him know he can get this medication from his Psychiatrist or from a Neurologist. He was recently seen by Dr Waynetta SandyWight asking for this medication.  He might need to be on referral wait list since he has orange card.  Selia Wareing,M.D

## 2014-07-30 NOTE — Telephone Encounter (Signed)
I have tried to page you about this.  The patient sent an email and then just came in for a lab appt.  No one called him to come in. [facility] LLC[facility] Janny Crute,CMA

## 2014-07-30 NOTE — Telephone Encounter (Signed)
Will forward to MD see what labs need to be ordered so I can get him an appt. Hawthorne Day,CMA

## 2014-07-30 NOTE — Telephone Encounter (Signed)
Pt sent email to doctor through [facility], says he is supposed to come in for labs but there are no lab orders in yet. Pt is very anxious to get in today to have these labs done. Please call pt once lab orders have been placed.

## 2014-07-30 NOTE — Telephone Encounter (Signed)
Patient requests Lab appointment. Talked with Nurse to ask approval from PCP.

## 2014-07-31 ENCOUNTER — Encounter: Payer: Self-pay | Admitting: Family Medicine

## 2014-07-31 ENCOUNTER — Telehealth: Payer: Self-pay | Admitting: Family Medicine

## 2014-07-31 NOTE — Telephone Encounter (Signed)
Pt called and wants the information in MY Chart changed to reflect the real information . He said that Dr. Lum BabeEniola did not order his lab work yesterday until after he had his lab work done. [facility] and he didn't want to go there so he came here and had this done. jw

## 2014-07-31 NOTE — Telephone Encounter (Signed)
Please inform patient I am not quite sure what he is talking about, whatever he is seeing on his Mychart, I do not recall putting it there. Let him know for now I will prefer all communications be through nursing staffs as I will not be responding to all his emails.

## 2014-07-31 NOTE — Telephone Encounter (Signed)
Will forward to MD to release hgb results to his mychart.  Patte Winkel,CMA

## 2014-08-01 ENCOUNTER — Encounter: Payer: Self-pay | Admitting: Family Medicine

## 2014-08-06 ENCOUNTER — Encounter: Payer: Self-pay | Admitting: Family Medicine

## 2014-08-06 NOTE — Telephone Encounter (Signed)
Looked into referral and they don't treat for daytime sleepiness.  Please advise if there is another route we can try.  Thanks [employer] BrandsJazmin Petra Dumler,CMA

## 2014-08-07 ENCOUNTER — Encounter: Payer: Self-pay | Admitting: Family Medicine

## 2014-08-09 ENCOUNTER — Other Ambulatory Visit: Payer: Self-pay | Admitting: Family Medicine

## 2014-08-09 DIAGNOSIS — G4719 Other hypersomnia: Secondary | ICD-10-CM

## 2014-08-09 NOTE — Progress Notes (Signed)
Thanks.  I put the referral in their workqueue. Jazmin Hartsell,CMA

## 2014-08-09 NOTE — Progress Notes (Signed)
I contacted Dr Roxy CedarYoung's office, they will see patient for his sleep disorder and fatigue.

## 2014-08-20 ENCOUNTER — Encounter: Payer: Self-pay | Admitting: *Deleted

## 2014-08-20 ENCOUNTER — Telehealth: Payer: Self-pay | Admitting: *Deleted

## 2014-08-20 NOTE — Telephone Encounter (Signed)
My chart message sent to patient.  LM for him to call back and make him aware. Raenah Murley,CMA

## 2014-08-20 NOTE — Telephone Encounter (Signed)
-----   Message from Janit PaganKehinde Eniola, MD sent at 08/20/2014  1:21 PM EST ----- Please call to inform patient that his sleep apnea machine is available for pick up in our clinic.

## 2014-08-22 ENCOUNTER — Encounter: Payer: Self-pay | Admitting: Family Medicine

## 2014-08-22 ENCOUNTER — Telehealth: Payer: Self-pay | Admitting: *Deleted

## 2014-08-22 NOTE — Telephone Encounter (Signed)
-----   Message from Janit PaganKehinde Eniola, MD sent at 08/22/2014  3:24 PM EST ----- Note, CPAP machine has been placed in the storage room.

## 2014-09-27 ENCOUNTER — Telehealth: Payer: Self-pay | Admitting: Critical Care Medicine

## 2014-09-27 ENCOUNTER — Encounter: Payer: Self-pay | Admitting: Internal Medicine

## 2014-09-27 ENCOUNTER — Ambulatory Visit (INDEPENDENT_AMBULATORY_CARE_PROVIDER_SITE_OTHER): Payer: No Typology Code available for payment source | Admitting: Internal Medicine

## 2014-09-27 VITALS — BP 120/78 | HR 85 | Ht 71.0 in | Wt 264.4 lb

## 2014-09-27 DIAGNOSIS — F32A Depression, unspecified: Secondary | ICD-10-CM

## 2014-09-27 DIAGNOSIS — G4733 Obstructive sleep apnea (adult) (pediatric): Secondary | ICD-10-CM

## 2014-09-27 DIAGNOSIS — F329 Major depressive disorder, single episode, unspecified: Secondary | ICD-10-CM

## 2014-09-27 MED ORDER — ARMODAFINIL 150 MG PO TABS: 150.0000 mg | ORAL_TABLET | Freq: Every day | ORAL | Status: DC

## 2014-09-27 NOTE — Progress Notes (Signed)
09/27/14 - [age] yoM never smoker referred courtesy of Dr Kennyth ArnoldEniola-sleep study on 04-30-14 On Zoloft. Wellbutrin, Concerta, Ritalin  for the past 5 years he complains of progressive daytime sleepiness. Bedtime between 8 and 10 PM, sleep latency 15 minutes, not aware of waking much during the night before up between 8 and 10 AM. He is using Ritalin and Concerta which helps some with his "focus" but not his sense of wakefulness. Adderall seemed to make him more sleepy. Caffeine has no effect. NPSG 04/30/14- positive for moderate obstructive sleep apnea, AHI 16.9 per hour with moderate snoring and desaturation to 86%. CPAP titrated to 14 CWP He has been using CPAP 14/[facility]/[employer]nea Association for purchase assistance, fullface mask. He says he is using CPAP all night every night but still craves sleep "like a drug". Has fallen asleep driving and sitting quietly on a few occasions. He stopped Ritalin and Concerta 10 days ago, anticipating this visit, with little dramatic change. No ENT surgery Thyroid and testosterone levels have been checked. He describes weakness/sleepiness onset with laughter, consistent with cataplexy. He denies symptoms consistent with sleep paralysis or hypnagogic hallucination. There is no family history of narcolepsy. He has been treated for depression. 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 Living alone  Prior to Admission medications   Medication Sig Start Date End Date Taking? Authorizing Provider  albuterol (PROVENTIL HFA;VENTOLIN HFA) 108 (90 BASE) MCG/ACT inhaler Inhale 2 puffs into the lungs every 6 (six) hours as needed for wheezing or shortness of breath. 07/07/13  Yes Janit PaganKehinde Eniola, MD  buPROPion (WELLBUTRIN) 100 MG tablet Take 100 mg by mouth daily.    Yes Historical Provider, MD  methylphenidate (CONCERTA) 54 MG CR tablet Take 54 mg by mouth every morning.   Yes Historical Provider, MD  methylphenidate (RITALIN) 10 MG tablet Take 10 mg by mouth daily.   Yes  Historical Provider, MD  sertraline (ZOLOFT) 50 MG tablet Take 50 mg by mouth daily.    Yes Historical Provider, MD  Testosterone 20.25 MG/1.25GM (1.62%) GEL Apply one pump daily 06/01/14  Yes Uvaldo RisingKyle J Fletke, MD  Armodafinil (NUVIGIL) 150 MG tablet Take 1 tablet (150 mg total) by mouth daily. 09/27/14   Waymon Budgelinton D Rhianon Zabawa, MD   Past Medical History  Diagnosis Date  . Anemia, iron deficiency   . Exertional dyspnea     secondary to anemia  . Depression   . Constipation   . ADD (attention deficit disorder)   . Hiatal hernia   . Depression   . Blood transfusion   . Asthma   . ADD (attention deficit disorder)    No past surgical history on file. Family History  Problem Relation Age of Onset  . Diabetes Paternal Grandfather   . Cancer Neg Hx   . Heart disease Neg Hx   . Stroke Neg Hx    History   Social History  . Marital Status: Single    Spouse Name: N/A    Number of Children: 0  . Years of Education: N/A   Occupational History  . student-Business    Social History Main Topics  . Smoking status: Never Smoker   . Smokeless tobacco: Never Used  . Alcohol Use: No  . Drug Use: No  . Sexual Activity: Not Currently   Other Topics Concern  . Not on file   Social History Narrative   Lives alone. Never married, no children.  Has two sister who live in [place].  Student at work study.  [employer]  [place].  Working toward Chief Technology Officer.  Previously worked in Geophysicist/field seismologist.  Was also previously homeless due to job loss and psychiatric exacerbation.   ROS-see HPI Constitutional:   No-   weight loss, night sweats, fevers, chills, +fatigue, lassitude. HEENT:   No-  headaches, difficulty swallowing, tooth/dental problems, sore throat,       No-  sneezing, itching, ear ache, nasal congestion, post nasal drip,  CV:  No-   chest pain, orthopnea, PND, swelling in lower extremities, anasarca,                                  dizziness, palpitations Resp: No-   shortness of  breath with exertion or at rest.              No-   productive cough,  No non-productive cough,  No- coughing up of blood.              No-   change in color of mucus.  No- wheezing.   Skin: No-   rash or lesions. GI:  No-   heartburn, indigestion, abdominal pain, nausea, vomiting, diarrhea,                 change in bowel habits, loss of appetite GU: No-   dysuria, change in color of urine, no urgency or frequency.  No- flank pain. MS:  No-   joint pain or swelling.  No- decreased range of motion.  No- back pain. Neuro-     nothing unusual Psych:  No- change in mood or affect. No depression or anxiety.  No memory loss.  OBJ- Physical Exam  + overweight General- Alert, Oriented, Affect-appropriate, Distress- none acute, calm Skin- rash-none, lesions- none, excoriation- none Lymphadenopathy- none Head- atraumatic            Eyes- Gross vision intact, PERRLA, conjunctivae and secretions clear            Ears- Hearing, canals-normal            Nose- Clear, no-Septal dev, mucus, polyps, erosion, perforation             Throat- Mallampati III , mucosa clear , drainage- none, tonsils- atrophic Neck- + thick neck , trachea midline, no stridor , thyroid nl, carotid no bruit Chest - symmetrical excursion , unlabored           Heart/CV- RRR , no murmur , no gallop  , no rub, nl s1 s2                           - JVD- none , edema- none, stasis changes- none, varices- none           Lung- clear to P&A, wheeze- none, cough- none , dullness-none, rub- none           Chest wall-  Abd- tender-no, distended-no, bowel sounds-present, HSM- no Br/ Gen/ Rectal- Not done, not indicated Extrem- cyanosis- none, clubbing, none, atrophy- none, strength- nl Neuro- grossly intact to observation

## 2014-09-27 NOTE — Telephone Encounter (Signed)
Called pt and appt r/s for today. Nothing further needed

## 2014-09-27 NOTE — Telephone Encounter (Signed)
Per CY yes this is okay for patient to come in today for sleep consult this afternoon; we are aware that this will show an overbooking for him. Thanks.

## 2014-09-27 NOTE — Patient Instructions (Signed)
Order- DME [facility] for download    Dx OSA  Samples Nuvigil 150   1 each morning instead of Ritalin/ Concerta for comparison  Naps are ok- 20-30 minutes when you choose

## 2014-09-27 NOTE — Telephone Encounter (Signed)
Pt is scheduled to see CDY tomorrow 09/28/14 at 9:15 am.  He is wanting to know if he can worked this afternoon since the weather expected tomorrow.  Please advise CDY thanks

## 2014-09-28 ENCOUNTER — Institutional Professional Consult (permissible substitution): Payer: No Typology Code available for payment source | Admitting: Internal Medicine

## 2014-09-28 NOTE — Assessment & Plan Note (Signed)
He describes compliance with CPAP but can't tell that it impacts his primary complaint of daytime sleepiness. He is on medications for mood disturbance which they be causing some of his fatigue symptoms. Note that he is not irresistibly falling asleep. I note he describes what may be cataplexy. We would not be able to get a MSLT off of his medications without permission from his treating physician. Plan-download from [facility] to document compliance and pressure control. Try samples of Nuvigil 150 mg, once daily, instead of Ritalin/Concerta, for comparison.

## 2014-09-28 NOTE — Assessment & Plan Note (Signed)
We may need to see if we could get permission to be off of Wellbutrin and Zoloft at some point, to see if that impacts his sense of sleepiness

## 2014-10-01 ENCOUNTER — Encounter: Payer: Self-pay | Admitting: Internal Medicine

## 2014-10-02 NOTE — Telephone Encounter (Signed)
The only reason to take Nuvigil or provigil would be to feel more alert. If it doesn't do that, we don't want it.  Ok to try Rx for provigil, 200 mg, # 5 (five) for self pay. One each morning as needed.  If he likes this, we can see about getting insurance to approve it.

## 2014-10-02 NOTE — Telephone Encounter (Signed)
Please advise on patient email Dr Maple HudsonYoung. Thanks.

## 2014-10-10 NOTE — Telephone Encounter (Addendum)
Pt keeps replying straight to me and not to triage/katie Will send message to [facility] to see if she has received a D/L, if not this will need to be followed up on and requested.  ------- Dr Maple HudsonYoung please advise on patient concern with Provigil cost out of pocket.  Thanks.

## 2014-10-11 ENCOUNTER — Encounter: Payer: Self-pay | Admitting: Internal Medicine

## 2014-10-11 ENCOUNTER — Telehealth: Payer: Self-pay | Admitting: Internal Medicine

## 2014-10-11 NOTE — Telephone Encounter (Signed)
I have spoken with that patient multiple times through e-mail and have sent Dr Maple HudsonYoung the e-mails with the patient concerns and questions.  Message was not closed by me on 10/10/14, it must have been closed by another user that the message was forwarded to.   Spoke with Dr Maple HudsonYoung and he states he understands the frustration with trying to get a medication that is cheap as he does not have insurance. Dr Maple HudsonYoung recommends that he take Ritalin and/or Adderall.  Spoke with patient, states that he is currently taking Ritalin 10mg  daily and it seems to help a lot (in limited aspects) Pt states that it helps with his focus mainly.   Pt would like a call back today of something else to try in place of Ritalin or in addition to Ritalin.  Please advise Dr Maple HudsonYoung, thanks.

## 2014-10-11 NOTE — Telephone Encounter (Signed)
His insurance won't cover provigil/ Nuvigil  He is already listed as taking Concerta (methylphenidate extended release) once daily- continue that  He is taking Ritalin(methylphenidate) 10 mg once daily- That can be taken up to 4 daily as needed, 1 or 2 at a time, in addition to the Concerta. If he needs a larger script to do this with the Ritalin 10 mg,  Then ok to Rx Ritalin 10 mg, # 60, to try using this way.

## 2014-10-11 NOTE — Telephone Encounter (Signed)
Called pt and LMTCB x1 

## 2014-10-11 NOTE — Telephone Encounter (Signed)
Spoke with the pt  He states calling "about all the e-mails" When asked if I could place him on hold to review them he states "no, I am on a phone that uses minutes and do not want to be put on hold" He does not wish do discuss anything over the phone stating "everything is in the emails" There is not an open email at this time  The e-mail was closed by AG 10/10/14 at 5:42 stating no further action required  Will forward to her to handle

## 2014-10-11 NOTE — Telephone Encounter (Signed)
LMTCB

## 2014-10-11 NOTE — Telephone Encounter (Signed)
Patient e-mail sent to make patient aware of rec's per CY since we could not get in touch with him via telephone.

## 2014-10-12 ENCOUNTER — Other Ambulatory Visit: Payer: Self-pay | Admitting: *Deleted

## 2014-10-12 MED ORDER — METHYLPHENIDATE HCL 20 MG PO TABS: ORAL_TABLET | ORAL | Status: DC

## 2014-10-12 NOTE — Telephone Encounter (Signed)
Rx signed for Ritalin 20mg , pt aware this is ready to be picked up. Pt will pick in 1 hour. Nothing further needed.

## 2014-10-12 NOTE — Telephone Encounter (Signed)
Dr. Young, please advise. Thanks!  

## 2014-10-12 NOTE — Telephone Encounter (Signed)
Per CY-lets have patient continue to take Concerta 54 1 tablet daily and lets change Ritalin 10 mg to Ritalin 20 mg up to 4 daily #120 for a month supply. Thanks.

## 2014-10-12 NOTE — Telephone Encounter (Signed)
This medication request has been reviewed with Katie.

## 2014-10-12 NOTE — Telephone Encounter (Signed)
Called and spoke to pt. Informed pt of the recs per CY. Pt verbalized understanding. Pt suggested if CY could prescribe another ER methylphenidate opposed to the Ritalin. Pt stated if not then he is fine with the Ritalin but is requesting those be sent in 20mg  tablets (not 10mg  tabs) d/t cost.   CY please advise.

## 2014-10-12 NOTE — Telephone Encounter (Signed)
Returning call [phone]

## 2014-10-12 NOTE — Telephone Encounter (Signed)
Pt calling to see when prescript will be ready for pick-up, will it be today please advise he can be reached @ [phone].Caren GriffinsStanley A Dalton

## 2014-10-12 NOTE — Telephone Encounter (Signed)
Patient replied directly to me again through patient e-mail. This will need to be discussed to patient that when he is sending a message via Surveyor, quantityMyChart E-mail, it needs to be addressed to the MD not a nurse. We have access to the MD's boxes to track messages but are not able to track what messages are being sent to nurses without a lot of research. This is how messages get missed and the patient will then go without a reply for long periods of time.  MyChart E-mail printed about Ritalin dosing request and has been given to Dr Maple HudsonYoung to address.  Will await for message to come back to triage.

## 2014-10-12 NOTE — Telephone Encounter (Signed)
CPAP download shows average use about 5 hours/ night, with adequate control of apneas. Our goal is to be able to use CPAP all night, every night.

## 2014-10-12 NOTE — Telephone Encounter (Signed)
Rx signed for Ritalin 20mg, pt aware this is ready to be picked up. Pt will pick in 1 hour. Nothing further needed. 

## 2014-10-17 ENCOUNTER — Ambulatory Visit: Payer: Self-pay

## 2014-10-17 ENCOUNTER — Encounter: Payer: Self-pay | Admitting: Internal Medicine

## 2014-10-17 NOTE — Telephone Encounter (Signed)
CY Please advise. Thanks.  

## 2014-10-19 NOTE — Telephone Encounter (Signed)
Ok to rewrite his ritalin script for # 60 tabs with same instructions

## 2014-10-24 ENCOUNTER — Ambulatory Visit: Payer: Self-pay

## 2014-10-26 ENCOUNTER — Encounter: Payer: Self-pay | Admitting: Internal Medicine

## 2014-10-26 MED ORDER — METHYLPHENIDATE HCL 20 MG PO TABS: ORAL_TABLET | ORAL | Status: DC

## 2014-11-08 ENCOUNTER — Encounter: Payer: Self-pay | Admitting: Internal Medicine

## 2014-11-08 ENCOUNTER — Ambulatory Visit: Payer: Self-pay | Admitting: Internal Medicine

## 2014-11-08 VITALS — BP 132/80 | HR 74 | Ht 71.0 in | Wt 266.4 lb

## 2014-11-08 DIAGNOSIS — R0602 Shortness of breath: Secondary | ICD-10-CM

## 2014-11-08 MED ORDER — TIOTROPIUM BROMIDE-OLODATEROL 2.5-2.5 MCG/ACT IN AERS
2.0000 | INHALATION_SPRAY | Freq: Every day | RESPIRATORY_TRACT | Status: DC
Start: 2014-11-08 — End: 2015-08-30

## 2014-11-08 MED ORDER — METHYLPHENIDATE HCL 20 MG PO TABS: ORAL_TABLET | ORAL | Status: DC

## 2014-11-08 MED ORDER — DEXMETHYLPHENIDATE HCL 10 MG PO TABS: 10.0000 mg | ORAL_TABLET | Freq: Two times a day (BID) | ORAL | Status: DC

## 2014-11-08 NOTE — Patient Instructions (Addendum)
Order- office spirometry     Dx asthma mild intermittent   Sample Stiolto inhaler   2 puffs once daily   Script Focalin 10 mg    Try 1 twice daily as needed, instead of Ritalin 20 mg               Will need to seek patient assistance application  Print script for Ritalin 20 to use until you can try Focalin

## 2014-11-08 NOTE — Progress Notes (Signed)
09/27/14 - 58 yoM never smoker referred courtesy of Dr Kennyth Arnold study on 04-30-14 On Zoloft. Wellbutrin, Concerta, Ritalin  for the past 5 years he complains of progressive daytime sleepiness. Bedtime between 8 and 10 PM, sleep latency 15 minutes, not aware of waking much during the night before up between 8 and 10 AM. He is using Ritalin and Concerta which helps some with his "focus" but not his sense of wakefulness. Adderall seemed to make him more sleepy. Caffeine has no effect. NPSG 04/30/14- positive for moderate obstructive sleep apnea, AHI 16.9 per hour with moderate snoring and desaturation to 86%. CPAP titrated to 14 CWP He has been using CPAP 14/[facility]/[employer] for purchase assistance, fullface mask. He says he is using CPAP all night every night but still craves sleep "like a drug". Has fallen asleep driving and sitting quietly on a few occasions. He stopped Ritalin and Concerta 10 days ago, anticipating this visit, with little dramatic change. No ENT surgery Thyroid and testosterone levels have been checked. He describes weakness/sleepiness onset with laughter, consistent with cataplexy. He denies symptoms consistent with sleep paralysis or hypnagogic hallucination. There is no family history of narcolepsy. He has been treated for depression. 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 Living alone  11/08/14- 48 yoM never smoker followed for OSA, hypersomnia, asthma, complicated by GERD, depression On Zoloft. Wellbutrin, Concerta, Ritalin CPAP 14/ [facility] ( machine has no modem so no Airview) FOLLOWS FOR: Wearing CPAP most every night-Download is attached to last OV notes. Pt is having a very hard time to stay awake during the day. Pt also noted he has used his Proair inhaler (200 inhalations) in one month and can not tell a difference in his breathing and notes he is wheezing today.     ROS-see HPI Constitutional:   No-   weight loss, night sweats, fevers, chills,  +fatigue, lassitude. HEENT:   No-  headaches, difficulty swallowing, tooth/dental problems, sore throat,       No-  sneezing, itching, ear ache, nasal congestion, post nasal drip,  CV:  No-   chest pain, orthopnea, PND, swelling in lower extremities, anasarca,                                  dizziness, palpitations Resp: No-   shortness of breath with exertion or at rest.              No-   productive cough,  No non-productive cough,  No- coughing up of blood.              No-   change in color of mucus.  No- wheezing.   Skin: No-   rash or lesions. GI:  No-   heartburn, indigestion, abdominal pain, nausea, vomiting, diarrhea,                 change in bowel habits, loss of appetite GU: No-   dysuria, change in color of urine, no urgency or frequency.  No- flank pain. MS:  No-   joint pain or swelling.  No- decreased range of motion.  No- back pain. Neuro-     nothing unusual Psych:  No- change in mood or affect. No depression or anxiety.  No memory loss.  OBJ- Physical Exam  + overweight General- Alert, Oriented, Affect-appropriate, Distress- none acute, calm Skin- rash-none, lesions- none, excoriation- none Lymphadenopathy- none Head- atraumatic  Eyes- Gross vision intact, PERRLA, conjunctivae and secretions clear            Ears- Hearing, canals-normal            Nose- Clear, no-Septal dev, mucus, polyps, erosion, perforation             Throat- Mallampati III , mucosa clear , drainage- none, tonsils- atrophic Neck- + thick neck , trachea midline, no stridor , thyroid nl, carotid no bruit Chest - symmetrical excursion , unlabored           Heart/CV- RRR , no murmur , no gallop  , no rub, nl s1 s2                           - JVD- none , edema- none, stasis changes- none, varices- none           Lung- clear to P&A, wheeze- none, cough- none , dullness-none, rub- none           Chest wall-  Abd- tender-no, distended-no, bowel sounds-present, HSM- no Br/ Gen/ Rectal- Not  done, not indicated Extrem- cyanosis- none, clubbing, none, atrophy- none, strength- nl Neuro- grossly intact to observation

## 2014-11-12 ENCOUNTER — Encounter: Payer: Self-pay | Admitting: Internal Medicine

## 2014-11-12 NOTE — Telephone Encounter (Signed)
Prednisone 10 mg, # 20, 1 every other day   No Ref

## 2014-11-12 NOTE — Telephone Encounter (Signed)
CY - please advise. Thanks! 

## 2014-11-13 MED ORDER — PREDNISONE 10 MG PO TABS: 10.0000 mg | ORAL_TABLET | ORAL | Status: DC

## 2014-11-13 NOTE — Addendum Note (Signed)
Addended by: Jaynee EaglesLEMONS, LINDSAY C on: 11/13/2014 04:45 PM   Modules accepted: Orders

## 2014-11-22 ENCOUNTER — Encounter: Payer: Self-pay | Admitting: Internal Medicine

## 2014-11-22 MED ORDER — METHYLPHENIDATE HCL 20 MG PO TABS: ORAL_TABLET | ORAL | Status: DC

## 2014-11-22 NOTE — Telephone Encounter (Signed)
done

## 2014-11-22 NOTE — Telephone Encounter (Signed)
Rx placed up front for pt to pick [facility]. Nothing further needed.

## 2014-11-22 NOTE — Telephone Encounter (Signed)
Pt needing Rx for Ritalin 20mg   Last refilled 11/08/2014 for a 15 day supply (#60) Pt takes QID.  Please advise if okay to refill. thanks.

## 2014-11-22 NOTE — Telephone Encounter (Signed)
Per Dr Maple HudsonYoung, okay to refill Ritalin for a 30 day supply #120 Rx printed and given to CY to sign. Pt coming to pick up today.  Rx needs to be placed up front when completed. Thanks.

## 2014-11-23 ENCOUNTER — Other Ambulatory Visit: Payer: Self-pay | Admitting: Family Medicine

## 2014-11-23 MED ORDER — ALBUTEROL SULFATE HFA 108 (90 BASE) MCG/ACT IN AERS: 2.0000 | INHALATION_SPRAY | Freq: Four times a day (QID) | RESPIRATORY_TRACT | Status: DC | PRN

## 2014-11-23 NOTE — Telephone Encounter (Signed)
Pt called and needs a refill on his albuterol. [facility] [address] Myriam Jacobsonjw

## 2014-11-23 NOTE — Telephone Encounter (Signed)
Please inform patient [facility] [address]

## 2014-11-29 ENCOUNTER — Encounter: Payer: Self-pay | Admitting: Family Medicine

## 2014-11-29 ENCOUNTER — Ambulatory Visit (INDEPENDENT_AMBULATORY_CARE_PROVIDER_SITE_OTHER): Payer: Self-pay | Admitting: Family Medicine

## 2014-11-29 VITALS — BP 119/86 | HR 110 | Temp 99.0°F | Ht 71.0 in | Wt 257.0 lb

## 2014-11-29 DIAGNOSIS — J4521 Mild intermittent asthma with (acute) exacerbation: Secondary | ICD-10-CM

## 2014-11-29 DIAGNOSIS — J4520 Mild intermittent asthma, uncomplicated: Secondary | ICD-10-CM

## 2014-11-29 MED ORDER — PREDNISONE 10 MG PO TABS: 10.0000 mg | ORAL_TABLET | ORAL | Status: DC

## 2014-11-29 MED ORDER — SPACER/AERO-HOLDING CHAMBERS DEVI: 1.0000 | Freq: Once | Status: DC

## 2014-11-30 NOTE — Assessment & Plan Note (Signed)
Minimal wheezes on exam; Normal RR  - Report prednisone has worked in past but doses over 20mg  "makes him agitated"  - Pred 20mg  x 7 days - Given Rx for spacer - but report couldn't afford.  - continue albuterol

## 2014-11-30 NOTE — Progress Notes (Signed)
   Subjective:    Patient ID: [name], male    DOB: Oct 24, 1965, 49 y.o.   MRN: [id number]  Seen for Same day visit for   CC: Asthma flare  He reports increased wheezing x 2 weeks. He has been using MDI w/o spacer ~ 10 x daily with minimal relief. Reports always get flare at this time of the year, but denies allergy symptoms. Denies previous hospitalization or intubation. Not currently on ICS. Rarely uses MDI most of the year. Denies fevers, chills, or sputum production. Denies smoking  Review of Systems   See HPI for ROS. Objective:  BP 119/86 mmHg  Pulse 110  Temp(Src) 99 F (37.2 C) (Oral)  Ht 5\' 11"  (1.803 m)  Wt 257 lb (116.574 kg)  BMI 35.86 kg/m2  SpO2 96%  General: NAD Cardiac: RRR, normal heart sounds, no murmurs. 2+ radial and PT pulses bilaterally Respiratory: CTAB, normal effort; Minimal exp wheeze.     Assessment & Plan:  See Problem List Documentation

## 2014-12-03 ENCOUNTER — Telehealth: Payer: Self-pay | Admitting: Internal Medicine

## 2014-12-03 MED ORDER — METHYLPHENIDATE HCL 20 MG PO TABS: ORAL_TABLET | ORAL | Status: DC

## 2014-12-03 NOTE — Telephone Encounter (Signed)
Please see phone note from 12/03/2014 as pt does not have Internet connection at this time and would like to be called.

## 2014-12-03 NOTE — Telephone Encounter (Signed)
Patient in lobby °

## 2014-12-03 NOTE — Telephone Encounter (Signed)
Pt aware we sent message back to CY to advise further.

## 2014-12-03 NOTE — Telephone Encounter (Signed)
LMTCB

## 2014-12-03 NOTE — Telephone Encounter (Signed)
Ok to Rx Ritalin 20 mg ER, # 15, 1 daily as needed

## 2014-12-03 NOTE — Telephone Encounter (Signed)
Called and spoke to pt. Pt stated the last Rx was scripted for 120 tabs of Ritalin 20mg . Pt stated he was only able to pick up 60 tabs d/t cost. Pt stated the pharmacy will need another rx to get the other 60 tabs. Pt is requesting a new rx for Ritalin 20mg  #60 tabs.   CY please advise.    Waymon Budgelinton D Young, MD at 12/03/2014 12:51 PM     Status: Signed       Expand All Collapse All   Ok to Rx Ritalin 20 mg ER, # 15, 1 daily as needed

## 2014-12-03 NOTE — Telephone Encounter (Signed)
Yes. I already responded to this today through encounter response to triage

## 2014-12-03 NOTE — Telephone Encounter (Signed)
Pt called requesting update. Please see phone note from 12/03/2014. Pt requesting a call back once CY responds as his Internet is down.

## 2014-12-03 NOTE — Telephone Encounter (Signed)
3.25.16 mychart message: Message     Hello:    Because of expense, was able to fill only 1/2 of this prescription. Pharmacist says I need a new prescription to fill the remaining half. May I get today a prescription so that I can fill the rest of the prescription?    Thank you,    [name]    Dr Maple HudsonYoung please advise if okay to write another rx for patient.  Thank you.

## 2014-12-03 NOTE — Telephone Encounter (Signed)
CY signed rx for #60 tabs for Ritalin 20mg  tabs. Rx given to pt. Nothing further needed.

## 2014-12-03 NOTE — Telephone Encounter (Signed)
Pt returning call, say got disconnected.Caren GriffinsStanley A Dalton

## 2014-12-03 NOTE — Telephone Encounter (Signed)
Lowell BoutonJessica E Jones, CMA at 12/03/2014 9:09 AM     Status: Signed       Expand All Collapse All   3.25.16 mychart message: Message     Hello:    Because of expense, was able to fill only 1/2 of this prescription. Pharmacist says I need a new prescription to fill the remaining half. May I get today a prescription so that I can fill the rest of the prescription?    Thank you,    Vicente Massonobert Burda    Dr Maple HudsonYoung please advise if okay to write another rx for patient. Thank you.         Called and informed pt of the update. Pt stated his Internet is down and is requesting a call back once CY has responded.

## 2014-12-06 ENCOUNTER — Encounter: Payer: Self-pay | Admitting: Internal Medicine

## 2014-12-06 NOTE — Telephone Encounter (Signed)
dexmethylphenidate (FOCALIN) 10 MG tablet LAST REFILLED 11/08/14 #60 X 0 REFILLS Please advise Dr. Maple HudsonYoung thanks

## 2014-12-07 ENCOUNTER — Telehealth: Payer: Self-pay | Admitting: Internal Medicine

## 2014-12-07 MED ORDER — DEXMETHYLPHENIDATE HCL 10 MG PO TABS: 10.0000 mg | ORAL_TABLET | Freq: Two times a day (BID) | ORAL | Status: DC

## 2014-12-07 NOTE — Addendum Note (Signed)
Addended by: Jaynee EaglesLEMONS, Cecily Lawhorne C on: 12/07/2014 02:05 PM   Modules accepted: Orders

## 2014-12-07 NOTE — Telephone Encounter (Signed)
Ok to refill his Focalin as requested

## 2014-12-07 NOTE — Telephone Encounter (Signed)
Per CDY- oka to refill the dexmethylphenidate  Rx has been printed and signed  It is in triage attached to red controlled med sheet  We need to know if wants to have this mailed or pick up rx  The email exchange was closed in error  Called the pt- NA, and msg states that caller is unavailable  [facility]

## 2014-12-07 NOTE — Telephone Encounter (Signed)
Please see email from 12/07/14  Lillia AbedLindsay is leaving rx up front for pick up  Pt aware per Lillia AbedLindsay

## 2014-12-12 ENCOUNTER — Telehealth: Payer: Self-pay | Admitting: Internal Medicine

## 2014-12-12 NOTE — Telephone Encounter (Signed)
Pt here now for refill of Ritalin- last refilled #60 with 0 refills on 12/03/2014.  According to 12/07/14 phone note this was picked up on 4/1.    Dr. Maple HudsonYoung are you ok with this refill?  Thanks!

## 2014-12-12 NOTE — Telephone Encounter (Signed)
Spoke with CY about this since pt was here in the office, CY states this is a half a month rx and he should not need a new rx after only 6 days.  Pt cannot pick this rx up until 4/15.  Pt aware.  Nothing further needed at this time.

## 2014-12-12 NOTE — Telephone Encounter (Signed)
Pt is here now - wants Rx for Ritalin. States he will wait.

## 2014-12-17 ENCOUNTER — Other Ambulatory Visit: Payer: Self-pay

## 2014-12-17 ENCOUNTER — Telehealth: Payer: Self-pay | Admitting: Internal Medicine

## 2014-12-17 MED ORDER — METHYLPHENIDATE HCL 20 MG PO TABS: ORAL_TABLET | ORAL | Status: DC

## 2014-12-17 NOTE — Telephone Encounter (Signed)
Last OV 11/08/14 Pending OV 01/08/15 Last refill of Ritalin 20mg  #60 was on 12/03/14  CY - please advise on refill. Thanks.

## 2014-12-17 NOTE — Telephone Encounter (Signed)
Patient in lobby states he is here to pick up rx, informed him CY has not responded to message and he states he wants to wait for response.

## 2014-12-17 NOTE — Telephone Encounter (Signed)
Per CY ok to refill #60- pt can pick this up q2w as this is a half-month supply.  rx printed, signed, and given to pt.  Nothing further needed.

## 2014-12-19 ENCOUNTER — Telehealth: Payer: Self-pay | Admitting: Internal Medicine

## 2014-12-19 ENCOUNTER — Encounter: Payer: Self-pay | Admitting: Internal Medicine

## 2014-12-19 NOTE — Telephone Encounter (Signed)
I called spoke with pt. He reports his pharm will give him a free inhaler for proair respiclick if we send in RX. He is wanting to know if CDY would be okay with this. Please advise thanks

## 2014-12-19 NOTE — Telephone Encounter (Signed)
Yes we can send script and coupon for Proair Respiclick   2 puffs every 6 hours if needed, rescue.

## 2014-12-20 MED ORDER — ALBUTEROL SULFATE 108 (90 BASE) MCG/ACT IN AEPB
2.0000 | INHALATION_SPRAY | Freq: Four times a day (QID) | RESPIRATORY_TRACT | Status: DC | PRN
Start: 2014-12-20 — End: 2015-03-08

## 2014-12-20 NOTE — Telephone Encounter (Signed)
lmtcb x2 

## 2014-12-20 NOTE — Telephone Encounter (Signed)
proair respiclick sent to [facility]. lmtcb X1 for pt to let him know his rx has been sent in.

## 2014-12-21 NOTE — Telephone Encounter (Signed)
lmtcb X3 for pt.  Will close per triage protocol.  

## 2014-12-23 ENCOUNTER — Encounter: Payer: Self-pay | Admitting: Internal Medicine

## 2014-12-27 ENCOUNTER — Encounter: Payer: Self-pay | Admitting: Internal Medicine

## 2014-12-27 NOTE — Telephone Encounter (Signed)
Last OV 11/08/14 Pending OV 01/08/15 Last refill of Ritalin 20mg  #60 12/17/14   CY - please advise on refill. Thanks.

## 2014-12-27 NOTE — Telephone Encounter (Signed)
These controlled drug prescriptions are for 2 weeks ( 14 days). He last got Ritalin on 4/11- 10 days ago. We can't fill early. He can call for it on the 25 th.

## 2014-12-28 NOTE — Telephone Encounter (Signed)
[facility] HudsonYoung to ensure that Rx is printed and signed for pt to pick up 12/31/14 Pt coming early to pick up on 12/31/14 (8:30am)

## 2014-12-31 MED ORDER — METHYLPHENIDATE HCL 20 MG PO TABS: ORAL_TABLET | ORAL | Status: DC

## 2014-12-31 NOTE — Addendum Note (Signed)
Addended by: Jaynee EaglesLEMONS, LINDSAY C on: 12/31/2014 09:29 AM   Modules accepted: Orders

## 2014-12-31 NOTE — Telephone Encounter (Signed)
Pt has come by the office and picked up this Rx. Nothing more needed at this time.

## 2014-12-31 NOTE — Telephone Encounter (Signed)
Ok to refill today. Must last at least 2 weeks.

## 2015-01-04 ENCOUNTER — Telehealth: Payer: Self-pay | Admitting: Internal Medicine

## 2015-01-04 ENCOUNTER — Encounter: Payer: Self-pay | Admitting: Internal Medicine

## 2015-01-04 MED ORDER — DEXMETHYLPHENIDATE HCL 10 MG PO TABS: 10.0000 mg | ORAL_TABLET | Freq: Two times a day (BID) | ORAL | Status: DC

## 2015-01-04 NOTE — Telephone Encounter (Signed)
Last OV 11/08/14 No pending OV at this time due to insurance issues Last refill 12/07/14  CY - please advise on refill. Thanks.

## 2015-01-04 NOTE — Telephone Encounter (Signed)
Ok to refill We will require an office visit at least once a year, so he needs to plan on it.

## 2015-01-04 NOTE — Telephone Encounter (Signed)
Rx has been printed, signed and placed up front for pick up. Pt is aware that it is ready for pick up. Nothing further was needed.

## 2015-01-08 ENCOUNTER — Ambulatory Visit: Payer: Self-pay | Admitting: Internal Medicine

## 2015-01-14 ENCOUNTER — Encounter: Payer: Self-pay | Admitting: Internal Medicine

## 2015-01-14 ENCOUNTER — Telehealth: Payer: Self-pay | Admitting: Internal Medicine

## 2015-01-14 MED ORDER — METHYLPHENIDATE HCL 20 MG PO TABS: ORAL_TABLET | ORAL | Status: DC

## 2015-01-14 NOTE — Telephone Encounter (Signed)
Katie- please check this- ok to refill, but not sooner than 2 weeks from last fill and not more than previous amount

## 2015-01-14 NOTE — Telephone Encounter (Signed)
Pt also sent a MyChart message about this. I have responded to him through MyChart. This message will be closed.

## 2015-01-14 NOTE — Telephone Encounter (Signed)
Last OV 11/29/14 No pending OV Last refill 12/31/14 #60  CY - please advise on refill. Thanks.

## 2015-01-14 NOTE — Addendum Note (Signed)
Addended by: Jaynee EaglesLEMONS, Niti Leisure C on: 01/14/2015 09:51 AM   Modules accepted: Orders

## 2015-01-25 ENCOUNTER — Encounter: Payer: Self-pay | Admitting: Internal Medicine

## 2015-01-25 MED ORDER — METHYLPHENIDATE HCL 20 MG PO TABS: ORAL_TABLET | ORAL | Status: DC

## 2015-01-25 NOTE — Telephone Encounter (Signed)
He can get refill of this controlled med 2 weeks= 14 days after last fill, = 01/28/15 Ok to place script with that date up front for him to get on Monday.

## 2015-01-25 NOTE — Telephone Encounter (Signed)
Dr. Maple HudsonYoung are you ok with this refill?  Last refill for Ritalin 01/14/15 #60 to last for 2 weeks.

## 2015-02-05 ENCOUNTER — Telehealth: Payer: Self-pay | Admitting: Internal Medicine

## 2015-02-05 ENCOUNTER — Encounter: Payer: Self-pay | Admitting: Internal Medicine

## 2015-02-05 MED ORDER — DEXMETHYLPHENIDATE HCL 10 MG PO TABS: 10.0000 mg | ORAL_TABLET | Freq: Two times a day (BID) | ORAL | Status: DC

## 2015-02-05 NOTE — Telephone Encounter (Signed)
Refill printed and placed on CY cart for signature.  Patient notified and will be here to pick it up today. Katie - please put in folder up front for pick up.  Thanks.

## 2015-02-05 NOTE — Telephone Encounter (Signed)
Spoke with pt, requesting a refill on his Focalin.  Last refilled 4/29 #60 to take 1 tab bid. Pt will pick up this med.    CY are you ok with this refill?  Thanks!

## 2015-02-05 NOTE — Telephone Encounter (Signed)
Ok to refill 

## 2015-02-05 NOTE — Telephone Encounter (Signed)
Pt returning call.[name] ° °

## 2015-02-05 NOTE — Telephone Encounter (Signed)
Done

## 2015-02-05 NOTE — Telephone Encounter (Signed)
lmtcb for pt.  

## 2015-02-05 NOTE — Telephone Encounter (Signed)
Pt requesting refill on focalin 10 mg. Last refilled 01/04/15 BID #60 x 0 refills  Please advise Dr. Maple HudsonYoung thanks  No Known Allergies   Current Outpatient Prescriptions on File Prior to Visit  Medication Sig Dispense Refill  . albuterol (PROVENTIL HFA;VENTOLIN HFA) 108 (90 BASE) MCG/ACT inhaler Inhale 2 puffs into the lungs every 6 (six) hours as needed for wheezing or shortness of breath. 1 Inhaler 3  . Albuterol Sulfate (PROAIR RESPICLICK) 108 (90 BASE) MCG/ACT AEPB Inhale 2 puffs into the lungs every 6 (six) hours as needed. 1 each 0  . Armodafinil (NUVIGIL) 150 MG tablet Take 1 tablet (150 mg total) by mouth daily. 7 tablet 0  . buPROPion (WELLBUTRIN) 100 MG tablet Take 100 mg by mouth daily.     Marland Kitchen. dexmethylphenidate (FOCALIN) 10 MG tablet Take 1 tablet (10 mg total) by mouth 2 (two) times daily. 60 tablet 0  . methylphenidate (CONCERTA) 54 MG CR tablet Take 54 mg by mouth every morning.    . methylphenidate (RITALIN) 20 MG tablet Take up to 4 tablets daily 60 tablet 0  . predniSONE (DELTASONE) 10 MG tablet Take 1 tablet (10 mg total) by mouth every other day. 20 tablet 0  . sertraline (ZOLOFT) 50 MG tablet Take 50 mg by mouth daily.     Marland Kitchen. Spacer/Aero-Holding Chambers DEVI 1 Device by Does not apply route once. 1 each 0  . Testosterone 20.25 MG/1.25GM (1.62%) GEL Apply one pump daily 2.5 g 2  . Tiotropium Bromide-Olodaterol (STIOLTO RESPIMAT) 2.5-2.5 MCG/ACT AERS Inhale 2 puffs into the lungs daily. 1 Inhaler 0   No current facility-administered medications on file prior to visit.

## 2015-02-06 NOTE — Telephone Encounter (Signed)
Rx placed at front for pick up.

## 2015-02-06 NOTE — Telephone Encounter (Signed)
Was this placed up front? Thanks.

## 2015-02-11 ENCOUNTER — Telehealth: Payer: Self-pay | Admitting: Internal Medicine

## 2015-02-11 ENCOUNTER — Encounter: Payer: Self-pay | Admitting: Internal Medicine

## 2015-02-11 MED ORDER — METHYLPHENIDATE HCL 20 MG PO TABS: ORAL_TABLET | ORAL | Status: DC

## 2015-02-11 NOTE — Telephone Encounter (Signed)
Last OV 11/29/14 No pending OV Last refill 01/25/15  CY - please advise on refill. Thanks.

## 2015-02-11 NOTE — Telephone Encounter (Signed)
Rx has been given to patient. Nothing more needed at this time.

## 2015-02-11 NOTE — Telephone Encounter (Signed)
Ok to refill Ritalin- must last 2 weeks

## 2015-02-11 NOTE — Addendum Note (Signed)
Addended by: Jaynee EaglesLEMONS, LINDSAY C on: 02/11/2015 11:56 AM   Modules accepted: Orders

## 2015-02-25 ENCOUNTER — Encounter: Payer: Self-pay | Admitting: Internal Medicine

## 2015-02-25 MED ORDER — METHYLPHENIDATE HCL 20 MG PO TABS: ORAL_TABLET | ORAL | Status: DC

## 2015-02-25 NOTE — Telephone Encounter (Signed)
I spoke with patient-aware that CY will be back in the office in about 10-15 minutes and does not mind waiting. Rx has been printed for CY to sign and I will personally give Rx to pt.

## 2015-02-25 NOTE — Telephone Encounter (Signed)
Patient has a ride to the office and is on his way here.  Hoping the RX can be ready.  Advised him I would send a message requesting to have it done as soon as possible.  [phone].

## 2015-02-25 NOTE — Telephone Encounter (Signed)
[name] is aware and is going to speak with pt

## 2015-02-25 NOTE — Telephone Encounter (Signed)
Pt is in lobby waiting for rx

## 2015-02-25 NOTE — Telephone Encounter (Signed)
Done

## 2015-02-25 NOTE — Telephone Encounter (Signed)
last refill on ritalin 20 mg was 02/11/15 #60 x 0 refills Please advise Dr. Maple Hudson on refill request. Thanks  No Known Allergies  Current Outpatient Prescriptions on File Prior to Visit  Medication Sig Dispense Refill  . albuterol (PROVENTIL HFA;VENTOLIN HFA) 108 (90 BASE) MCG/ACT inhaler Inhale 2 puffs into the lungs every 6 (six) hours as needed for wheezing or shortness of breath. 1 Inhaler 3  . Albuterol Sulfate (PROAIR RESPICLICK) 108 (90 BASE) MCG/ACT AEPB Inhale 2 puffs into the lungs every 6 (six) hours as needed. 1 each 0  . Armodafinil (NUVIGIL) 150 MG tablet Take 1 tablet (150 mg total) by mouth daily. 7 tablet 0  . buPROPion (WELLBUTRIN) 100 MG tablet Take 100 mg by mouth daily.     Marland Kitchen dexmethylphenidate (FOCALIN) 10 MG tablet Take 1 tablet (10 mg total) by mouth 2 (two) times daily. 60 tablet 0  . methylphenidate (CONCERTA) 54 MG CR tablet Take 54 mg by mouth every morning.    . methylphenidate (RITALIN) 20 MG tablet Take up to 4 tablets daily 60 tablet 0  . predniSONE (DELTASONE) 10 MG tablet Take 1 tablet (10 mg total) by mouth every other day. 20 tablet 0  . sertraline (ZOLOFT) 50 MG tablet Take 50 mg by mouth daily.     Marland Kitchen Spacer/Aero-Holding Chambers DEVI 1 Device by Does not apply route once. 1 each 0  . Testosterone 20.25 MG/1.25GM (1.62%) GEL Apply one pump daily 2.5 g 2  . Tiotropium Bromide-Olodaterol (STIOLTO RESPIMAT) 2.5-2.5 MCG/ACT AERS Inhale 2 puffs into the lungs daily. 1 Inhaler 0   No current facility-administered medications on file prior to visit.

## 2015-03-05 ENCOUNTER — Encounter: Payer: Self-pay | Admitting: Internal Medicine

## 2015-03-05 NOTE — Telephone Encounter (Signed)
focalin 10 mg last refilled 02/05/15 #60 x 0 refills BID. Please advise Dr. Maple HudsonYoung thanks

## 2015-03-06 ENCOUNTER — Telehealth: Payer: Self-pay | Admitting: Internal Medicine

## 2015-03-06 MED ORDER — DEXMETHYLPHENIDATE HCL 10 MG PO TABS: 10.0000 mg | ORAL_TABLET | Freq: Two times a day (BID) | ORAL | Status: DC

## 2015-03-06 NOTE — Telephone Encounter (Signed)
Pt returned call and I let him know that rx was ready for pick-up.Caren GriffinsStanley A Dalton

## 2015-03-06 NOTE — Telephone Encounter (Signed)
Ok to refill as requested 

## 2015-03-06 NOTE — Telephone Encounter (Signed)
lmomtcb x1 RX signed and placed upfront for pick up

## 2015-03-07 ENCOUNTER — Encounter: Payer: Self-pay | Admitting: Internal Medicine

## 2015-03-07 NOTE — Telephone Encounter (Signed)
CY please advise on refill. Thanks. 

## 2015-03-08 ENCOUNTER — Other Ambulatory Visit: Payer: Self-pay | Admitting: Family Medicine

## 2015-03-08 ENCOUNTER — Telehealth: Payer: Self-pay | Admitting: Internal Medicine

## 2015-03-08 MED ORDER — METHYLPHENIDATE HCL 20 MG PO TABS: ORAL_TABLET | ORAL | Status: DC

## 2015-03-08 NOTE — Telephone Encounter (Signed)
Rx has been printed and placed on CY's cart to be signed. Marchelle Folksmanda will place up front once signed. Pt is aware of this. Nothing further was needed.

## 2015-03-08 NOTE — Telephone Encounter (Signed)
Last OV 11/08/14 No pending OV 02/25/15 #60  CY - please advise on refill. Thanks.

## 2015-03-08 NOTE — Telephone Encounter (Signed)
Pharmacy calling to get approval for patient to refill Ritalin 5 days early.  Patient going out of town for the holidays and will not be back to refill in 5 days, ok'd for it to be refilled for the holidays. Nothing further needed.

## 2015-03-08 NOTE — Telephone Encounter (Signed)
Ok to refill as requested 

## 2015-03-08 NOTE — Telephone Encounter (Signed)
Pt calling to check on status of refill.[name] ° °

## 2015-03-22 ENCOUNTER — Encounter: Payer: Self-pay | Admitting: Internal Medicine

## 2015-03-22 MED ORDER — METHYLPHENIDATE HCL 20 MG PO TABS: ORAL_TABLET | ORAL | Status: DC

## 2015-03-22 NOTE — Addendum Note (Signed)
Addended by: Tommie SamsSILVA, MINDY S on: 03/22/2015 10:19 AM   Modules accepted: Orders

## 2015-03-22 NOTE — Telephone Encounter (Signed)
Pt last had refill on ritalin 20 mg 03/08/15 #60 tabs Take up to 4 tablets daily Last OV 11/08/14 No pending appt Please advise Dr. Maple HudsonYoung thanks

## 2015-03-22 NOTE — Telephone Encounter (Signed)
Ok to refill as requested 

## 2015-03-22 NOTE — Telephone Encounter (Signed)
RX signed and placed for pick up. Pt email sent out to advise him.

## 2015-04-04 ENCOUNTER — Encounter: Payer: Self-pay | Admitting: Internal Medicine

## 2015-04-04 MED ORDER — METHYLPHENIDATE HCL 20 MG PO TABS: ORAL_TABLET | ORAL | Status: DC

## 2015-04-04 NOTE — Telephone Encounter (Signed)
CY please advise on refill. Thanks.  Last OV 11/08/14 Last refill 03/22/15

## 2015-04-04 NOTE — Addendum Note (Signed)
Addended by: Tommie Sams on: 04/04/2015 04:51 PM   Modules accepted: Orders

## 2015-04-04 NOTE — Telephone Encounter (Signed)
Ok to refill as requested 

## 2015-04-18 ENCOUNTER — Encounter: Payer: Self-pay | Admitting: Internal Medicine

## 2015-04-18 MED ORDER — METHYLPHENIDATE HCL 20 MG PO TABS: ORAL_TABLET | ORAL | Status: DC

## 2015-04-18 NOTE — Telephone Encounter (Signed)
methylphenidate (RITALIN) 20 MG tablet [[id number]]  Take up to 4 tablets daily Last refilled 04/04/15 #60 x 0 refills  Please advise on refill Dr. Maple Hudson thanks

## 2015-04-18 NOTE — Telephone Encounter (Signed)
Ok to refill methylphenidate 20 mg as requested.

## 2015-04-29 ENCOUNTER — Telehealth: Payer: Self-pay | Admitting: Internal Medicine

## 2015-04-29 MED ORDER — DEXMETHYLPHENIDATE HCL 10 MG PO TABS: 10.0000 mg | ORAL_TABLET | Freq: Two times a day (BID) | ORAL | Status: DC

## 2015-04-29 NOTE — Telephone Encounter (Signed)
Pt is requesting a refill on Focalin . Last OV was on 11/08/2014. No pending OV at this time. Last refill was on 03/06/2015 >> Focalin  1 BID #60.  CY  - please advise on refill. Thanks.

## 2015-04-29 NOTE — Telephone Encounter (Signed)
PATIENT IN LOBBY for RX for Focalin.

## 2015-04-29 NOTE — Telephone Encounter (Signed)
Ok to refill Focalin  He was to have made a f/u visit in May, following last visit in March.  Please have him make a next routine OV as space available- ok if it is a few months.

## 2015-04-29 NOTE — Telephone Encounter (Signed)
RX printed and given to pt. He is going to have to call for the appt once he look at his schedule. Nothing further needed

## 2015-05-01 ENCOUNTER — Encounter: Payer: Self-pay | Admitting: Internal Medicine

## 2015-05-02 ENCOUNTER — Encounter: Payer: Self-pay | Admitting: Internal Medicine

## 2015-05-02 MED ORDER — METHYLPHENIDATE HCL 20 MG PO TABS: ORAL_TABLET | ORAL | Status: DC

## 2015-05-02 NOTE — Telephone Encounter (Signed)
Ok to refill 

## 2015-05-02 NOTE — Telephone Encounter (Signed)
Dr. Maple Hudson, please advise on pt refill request.   Ritalin  #60 up to 4 tablets daily.  Last refill 04/18/15.  Pt is wanting to pick this up in the office on Friday morning.  Thanks!

## 2015-05-02 NOTE — Addendum Note (Signed)
Addended by: Christen Butter on: 05/02/2015 05:32 PM   Modules accepted: Orders

## 2015-05-16 ENCOUNTER — Encounter: Payer: Self-pay | Admitting: Internal Medicine

## 2015-05-16 NOTE — Telephone Encounter (Signed)
Pt requesting refill on ritalin 20 mg QID. Last refilled 05/02/15 #60 x 0 refills  Please advise Dr. Maple Hudson thanks

## 2015-05-17 ENCOUNTER — Encounter: Payer: Self-pay | Admitting: Internal Medicine

## 2015-05-17 MED ORDER — METHYLPHENIDATE HCL 20 MG PO TABS: ORAL_TABLET | ORAL | Status: DC

## 2015-05-17 NOTE — Addendum Note (Signed)
Addended by: Tommie Sams on: 05/17/2015 10:00 AM   Modules accepted: Orders

## 2015-05-17 NOTE — Telephone Encounter (Signed)
Ok to refill as before 

## 2015-05-20 ENCOUNTER — Other Ambulatory Visit: Payer: Self-pay | Admitting: Family Medicine

## 2015-05-30 ENCOUNTER — Encounter: Payer: Self-pay | Admitting: Internal Medicine

## 2015-05-30 MED ORDER — METHYLPHENIDATE HCL 20 MG PO TABS: ORAL_TABLET | ORAL | Status: DC

## 2015-05-30 NOTE — Addendum Note (Signed)
Addended by: Tommie Sams on: 05/30/2015 04:35 PM   Modules accepted: Orders

## 2015-05-30 NOTE — Telephone Encounter (Signed)
Ok to refill as requested 

## 2015-05-30 NOTE — Telephone Encounter (Signed)
Per pt email: Hello:  May I get a refill prescription for Ritalin  4 times per day? If it is okay, I will pick up prescription tomorrow morning. Tomorrow I will be pinched for time, so I need to pick up Rx first time (8:30/9am) tomorrow morning.  Thank you,  [name] -----  Pt last had refill 05/17/15 #60 x 0 refills Take up to 4 tablets daily  Please advise thanks

## 2015-06-05 ENCOUNTER — Encounter: Payer: Self-pay | Admitting: Internal Medicine

## 2015-06-05 MED ORDER — DEXMETHYLPHENIDATE HCL 10 MG PO TABS: 10.0000 mg | ORAL_TABLET | Freq: Two times a day (BID) | ORAL | Status: DC

## 2015-06-05 NOTE — Addendum Note (Signed)
Addended by: Jaynee Eagles C on: 06/05/2015 10:58 AM   Modules accepted: Orders

## 2015-06-05 NOTE — Telephone Encounter (Signed)
Ok to refill as requested 

## 2015-06-05 NOTE — Telephone Encounter (Signed)
Last OV 11/08/14 Pending OV 08/30/15 Last refill 04/29/15  CY - please advise. Thanks.

## 2015-06-13 ENCOUNTER — Encounter: Payer: Self-pay | Admitting: Internal Medicine

## 2015-06-13 MED ORDER — METHYLPHENIDATE HCL 20 MG PO TABS: ORAL_TABLET | ORAL | Status: DC

## 2015-06-13 NOTE — Telephone Encounter (Signed)
10.6.16 mychart email from pt: Message     Hello:        May I get a refill prescription for Ritalin  4 times per day? If it is okay, I will pick up Rx early tomorrow (Friday, 06-14-2015) morning.         Thank you,    [name]      Last ov 11/2014, follow up in 3 months Pt never seen for follow up but does have pending appt with CY on 12.23.16 Dr Maple Hudson, please advise if okay to send the Ritalin rx.  Thank you.

## 2015-06-13 NOTE — Addendum Note (Signed)
Addended by: Jaynee Eagles C on: 06/13/2015 02:36 PM   Modules accepted: Orders

## 2015-06-13 NOTE — Telephone Encounter (Signed)
Ok to refill 

## 2015-06-27 ENCOUNTER — Encounter: Payer: Self-pay | Admitting: Internal Medicine

## 2015-06-27 MED ORDER — METHYLPHENIDATE HCL 20 MG PO TABS: ORAL_TABLET | ORAL | Status: DC

## 2015-06-27 NOTE — Telephone Encounter (Signed)
Last OV 11/08/14 Pending OV 08/30/15 Last refill 06/13/15 #60  CY - please advise on refill. Thanks.

## 2015-06-27 NOTE — Telephone Encounter (Signed)
Ok to refill 

## 2015-06-27 NOTE — Addendum Note (Signed)
Addended by: Tommie SamsSILVA, MINDY S on: 06/27/2015 05:36 PM   Modules accepted: Orders

## 2015-07-04 ENCOUNTER — Encounter: Payer: Self-pay | Admitting: Internal Medicine

## 2015-07-04 NOTE — Telephone Encounter (Signed)
Pt is requesting refill on Focalin.  CY - please advise on refill. Thanks.

## 2015-07-05 ENCOUNTER — Telehealth: Payer: Self-pay | Admitting: Internal Medicine

## 2015-07-05 MED ORDER — DEXMETHYLPHENIDATE HCL 10 MG PO TABS: 10.0000 mg | ORAL_TABLET | Freq: Two times a day (BID) | ORAL | Status: DC

## 2015-07-05 NOTE — Telephone Encounter (Signed)
Nothing further needed. Spoke with pt (see phone note from 10.28.16). Will sign off.

## 2015-07-05 NOTE — Telephone Encounter (Signed)
Called and spoke to pt. Informed him of the refill (CY approved rx in 10.27.16 email). Rx signed and placed up front for pick up. Pt verbalized understanding and denied any further questions or concerns at this time.    Notes      Waymon Budgelinton D Young, MD at 07/05/2015 9:03 AM     Status: Signed       Expand All Collapse All   Ok to refill            Lorel MonacoLindsay C Lemons, CMA at 07/04/2015 2:11 PM     Status: Signed       Expand All Collapse All   Pt is requesting refill on Focalin.  CY - please advise on refill. Thanks.

## 2015-07-05 NOTE — Telephone Encounter (Signed)
Ok to refill 

## 2015-07-11 ENCOUNTER — Encounter: Payer: Self-pay | Admitting: Internal Medicine

## 2015-07-11 MED ORDER — METHYLPHENIDATE HCL 20 MG PO TABS: ORAL_TABLET | ORAL | Status: DC

## 2015-07-11 NOTE — Telephone Encounter (Signed)
This has been refilled via [facility]

## 2015-07-11 NOTE — Telephone Encounter (Signed)
rx signed and placed up front for pick up 

## 2015-07-11 NOTE — Telephone Encounter (Signed)
Done

## 2015-07-25 ENCOUNTER — Encounter: Payer: Self-pay | Admitting: Internal Medicine

## 2015-07-26 ENCOUNTER — Encounter: Payer: Self-pay | Admitting: Internal Medicine

## 2015-07-26 MED ORDER — METHYLPHENIDATE HCL 20 MG PO TABS: ORAL_TABLET | ORAL | Status: DC

## 2015-07-26 NOTE — Telephone Encounter (Signed)
Pt is requesting a refill on ritalin 20 mg Sig: Take up to 4 tablets daily Last refilled 11/3  #60 x 0 refills  Please advise Dr. Maple HudsonYoung thanks

## 2015-07-26 NOTE — Addendum Note (Signed)
Addended by: Velvet BatheAULFIELD, Bari Leib L on: 07/26/2015 01:35 PM   Modules accepted: Orders

## 2015-07-26 NOTE — Telephone Encounter (Signed)
Ok to refill 

## 2015-08-05 ENCOUNTER — Telehealth: Payer: Self-pay | Admitting: Internal Medicine

## 2015-08-05 ENCOUNTER — Encounter: Payer: Self-pay | Admitting: Internal Medicine

## 2015-08-05 MED ORDER — DEXMETHYLPHENIDATE HCL 10 MG PO TABS: 10.0000 mg | ORAL_TABLET | Freq: Two times a day (BID) | ORAL | Status: DC

## 2015-08-05 NOTE — Telephone Encounter (Signed)
Rx signed and given to patient. Nothing further needed.

## 2015-08-05 NOTE — Telephone Encounter (Signed)
Ok to refill [facility]

## 2015-08-05 NOTE — Telephone Encounter (Signed)
Rx printed and placed on [name]'s cart. Patient is waiting in lobby for Rx. Patient aware that [name] is at lunch and says he is fine waiting in lobby   FYI to Ashtyn/Dr. Maple HudsonYoung

## 2015-08-05 NOTE — Telephone Encounter (Signed)
Pt requesting refill on focalin 10 mg Last refilled 10/28 #60 Take 1 tablet (10 mg total) by mouth 2 (two) times daily ---  Please advise Dr. Maple HudsonYoung thanks

## 2015-08-08 ENCOUNTER — Encounter: Payer: Self-pay | Admitting: Internal Medicine

## 2015-08-08 NOTE — Telephone Encounter (Signed)
Pt requesting refill on Ritalin 20mg  tabs Last filled 07/26/15 #60 with no refills Last OV 11/08/14 Next OV 08/30/15  Dr Maple HudsonYoung, please advise if you are ok with refilling medication

## 2015-08-09 MED ORDER — METHYLPHENIDATE HCL 20 MG PO TABS: ORAL_TABLET | ORAL | Status: DC

## 2015-08-09 NOTE — Telephone Encounter (Signed)
Rx printed and left at front for patient to pick up. Patient notified via mychart that Rx is ready for pick up. Nothing further needed. Closing encounter

## 2015-08-09 NOTE — Telephone Encounter (Signed)
Ok to refill 

## 2015-08-09 NOTE — Addendum Note (Signed)
Addended by: York RamGAY, Aeon Koors on: 08/09/2015 11:54 AM   Modules accepted: Orders

## 2015-08-22 ENCOUNTER — Encounter: Payer: Self-pay | Admitting: Internal Medicine

## 2015-08-22 NOTE — Telephone Encounter (Signed)
Last given ritalin 20 mg # 60 1 tab up to 4 x daily  Last ov 11/08/14 Next ov 08/30/15  Please advise if okay to refill

## 2015-08-23 MED ORDER — METHYLPHENIDATE HCL 20 MG PO TABS: ORAL_TABLET | ORAL | Status: DC

## 2015-08-23 NOTE — Addendum Note (Signed)
Addended by: Velvet BatheAULFIELD, Ziare Cryder L on: 08/23/2015 12:34 PM   Modules accepted: Orders

## 2015-08-23 NOTE — Telephone Encounter (Signed)
Ok to refill 

## 2015-08-23 NOTE — Telephone Encounter (Signed)
rx printed and left up front.  mychart message sent to pt making aware of refill being ready.  Nothing further needed.

## 2015-08-30 ENCOUNTER — Encounter: Payer: Self-pay | Admitting: Internal Medicine

## 2015-08-30 ENCOUNTER — Ambulatory Visit: Payer: Self-pay | Admitting: Internal Medicine

## 2015-08-30 VITALS — BP 128/76 | HR 102 | Ht 71.0 in | Wt 258.4 lb

## 2015-08-30 DIAGNOSIS — Z23 Encounter for immunization: Secondary | ICD-10-CM

## 2015-08-30 DIAGNOSIS — G4733 Obstructive sleep apnea (adult) (pediatric): Secondary | ICD-10-CM

## 2015-08-30 NOTE — Patient Instructions (Signed)
Order- DME [facility]- replacement CPAP mask of choice and supplies. Needs help with financial assistance.

## 2015-08-30 NOTE — Progress Notes (Signed)
09/27/14 - 56 yoM never smoker referred courtesy of Dr Kennyth Arnold study on 04-30-14 On Zoloft. Wellbutrin, Concerta, Ritalin  for the past 5 years he complains of progressive daytime sleepiness. Bedtime between 8 and 10 PM, sleep latency 15 minutes, not aware of waking much during the night before up between 8 and 10 AM. He is using Ritalin and Concerta which helps some with his "focus" but not his sense of wakefulness. Adderall seemed to make him more sleepy. Caffeine has no effect. NPSG 04/30/14- positive for moderate obstructive sleep apnea, AHI 16.9 per hour with moderate snoring and desaturation to 86%. CPAP titrated to 14 CWP He has been using CPAP 14/[facility]/[employer] for purchase assistance, fullface mask. He says he is using CPAP all night every night but still craves sleep "like a drug". Has fallen asleep driving and sitting quietly on a few occasions. He stopped Ritalin and Concerta 10 days ago, anticipating this visit, with little dramatic change. No ENT surgery Thyroid and testosterone levels have been checked. He describes weakness/sleepiness onset with laughter, consistent with cataplexy. He denies symptoms consistent with sleep paralysis or hypnagogic hallucination. There is no family history of narcolepsy. He has been treated for depression. 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 Living alone  11/08/14- 48 yoM never smoker followed for OSA, hypersomnia, asthma, complicated by GERD, depression On Zoloft. Wellbutrin, Concerta, Ritalin CPAP 14/ [facility] ( machine has no modem so no Airview) FOLLOWS FOR: Wearing CPAP most every night-Download is attached to last OV notes. Pt is having a very hard time to stay awake during the day. Pt also noted he has used his Proair inhaler (200 inhalations) in one month and can not tell a difference in his breathing and notes he is wheezing today.   08/30/2015-49 year old male never smoker followed for OSA, hypersomnia, asthma,  complicated by GERD, depression On Zoloft. Wellbutrin, Concerta, Ritalin CPAP 14/[facility]  ( machine has no modem so no Airview) FOLLOWS FOR: Pt has stopped most meds - still taking Ritalin daily. Pt has not used CPAP machine x 2-3 months. States that his face mask has stretched and he has not been able to afford to replace it. Last asked for [facility] 11/28.     ROS-see HPI Constitutional:   No-   weight loss, night sweats, fevers, chills, +fatigue, lassitude. HEENT:   No-  headaches, difficulty swallowing, tooth/dental problems, sore throat,       No-  sneezing, itching, ear ache, nasal congestion, post nasal drip,  CV:  No-   chest pain, orthopnea, PND, swelling in lower extremities, anasarca,                                                       dizziness, palpitations Resp: No-   shortness of breath with exertion or at rest.              No-   productive cough,  No non-productive cough,  No- coughing up of blood.              No-   change in color of mucus.  No- wheezing.   Skin: No-   rash or lesions. GI:  No-   heartburn, indigestion, abdominal pain, nausea, vomiting, diarrhea,                 change  in bowel habits, loss of appetite GU: No-   dysuria, change in color of urine, no urgency or frequency.  No- flank pain. MS:  No-   joint pain or swelling.  No- decreased range of motion.  No- back pain. Neuro-     nothing unusual Psych:  No- change in mood or affect. No depression or anxiety.  No memory loss.  OBJ- Physical Exam  + overweight General- Alert, Oriented, Affect-appropriate, Distress- none acute, calm Skin- rash-none, lesions- none, excoriation- none Lymphadenopathy- none Head- atraumatic            Eyes- Gross vision intact, PERRLA, conjunctivae and secretions clear            Ears- Hearing, canals-normal            Nose- Clear, no-Septal dev, mucus, polyps, erosion, perforation             Throat- Mallampati III , mucosa clear , drainage- none, tonsils-  atrophic Neck- + thick neck , trachea midline, no stridor , thyroid nl, carotid no bruit Chest - symmetrical excursion , unlabored           Heart/CV- RRR , no murmur , no gallop  , no rub, nl s1 s2                           - JVD- none , edema- none, stasis changes- none, varices- none           Lung- clear to P&A, wheeze- none, cough- none , dullness-none, rub- none           Chest wall-  Abd- tender-no, distended-no, bowel sounds-present, HSM- no Br/ Gen/ Rectal- Not done, not indicated Extrem- cyanosis- none, clubbing, none, atrophy- none, strength- nl Neuro- grossly intact to observation

## 2015-09-05 ENCOUNTER — Encounter: Payer: Self-pay | Admitting: Internal Medicine

## 2015-09-05 MED ORDER — METHYLPHENIDATE HCL 20 MG PO TABS: ORAL_TABLET | ORAL | Status: DC

## 2015-09-05 NOTE — Addendum Note (Signed)
Addended by: Reynaldo MiniumWELCHEL, Lashea Goda C on: 09/05/2015 02:57 PM   Modules accepted: Orders, Medications

## 2015-09-05 NOTE — Telephone Encounter (Signed)
Pt came to office to get RX-printed,signed by CY, and given to patient. Nothing more needed.

## 2015-09-05 NOTE — Telephone Encounter (Signed)
Pt last had refill on ritalin 20 mg 08/23/15 #60 x o refills Take up to 4 tablets daily  Please advise Dr. Maple HudsonYoung thanks

## 2015-09-05 NOTE — Telephone Encounter (Signed)
Ok to refill 

## 2015-09-11 ENCOUNTER — Telehealth: Payer: Self-pay | Admitting: Internal Medicine

## 2015-09-11 ENCOUNTER — Ambulatory Visit (INDEPENDENT_AMBULATORY_CARE_PROVIDER_SITE_OTHER): Payer: Self-pay | Admitting: Internal Medicine

## 2015-09-11 ENCOUNTER — Encounter: Payer: Self-pay | Admitting: Internal Medicine

## 2015-09-11 VITALS — BP 136/84 | HR 107

## 2015-09-11 DIAGNOSIS — J45909 Unspecified asthma, uncomplicated: Secondary | ICD-10-CM

## 2015-09-11 DIAGNOSIS — J4521 Mild intermittent asthma with (acute) exacerbation: Secondary | ICD-10-CM

## 2015-09-11 MED ORDER — LEVALBUTEROL HCL 0.63 MG/3ML IN NEBU
0.6300 mg | INHALATION_SOLUTION | Freq: Once | RESPIRATORY_TRACT | Status: AC
  Administered 2015-09-11: 0.63 mg via RESPIRATORY_TRACT

## 2015-09-11 MED ORDER — METHYLPREDNISOLONE ACETATE 80 MG/ML IJ SUSP
80.0000 mg | Freq: Once | INTRAMUSCULAR | Status: AC
  Administered 2015-09-11: 80 mg via INTRAMUSCULAR

## 2015-09-11 MED ORDER — DEXMETHYLPHENIDATE HCL 10 MG PO TABS
10.0000 mg | ORAL_TABLET | Freq: Two times a day (BID) | ORAL | Status: DC
Start: 2015-09-11 — End: 2015-11-11

## 2015-09-11 NOTE — Patient Instructions (Addendum)
Neb Xopenex 0.63  Depo-Medrol 80 mg  Sample Proair Respiclick inhaler  Keep follow-up appointment

## 2015-09-11 NOTE — Assessment & Plan Note (Signed)
Acute exacerbation, probably viral. Complicating his his lack of financial resources. Plan-Neb Xopenex, Depo-Medrol, sample pro air Respiclick inhaler

## 2015-09-11 NOTE — Telephone Encounter (Signed)
CDY please advise if okay to refill Focalin 10 mg bid  Last rx given on 08/05/15 # 60 no refills  Last ov 08/30/15  Next ov 09/01/16 Thanks

## 2015-09-11 NOTE — Telephone Encounter (Signed)
Ok to refill 

## 2015-09-11 NOTE — Telephone Encounter (Signed)
Rx printed, signed and left up front to be picked up. Patient notified via mychart (pt's preferred contact method) Nothing further needed.

## 2015-09-11 NOTE — Telephone Encounter (Signed)
Patient came into office to pick up Rx.  Patient complained that he was having trouble breathing.  Patient has hx of Asthma.  Patient states that he does not have an inhaler because he is not able to afford one.  Patient began wheezing a lot, felt light headed, started to turn pale and felt faint.  Placed patient on 2L of O2 and he said that it made him feel a little better, but he says that his chest still felt very heavy.  Advised Dr. Maple HudsonYoung about patient, Dr. Maple HudsonYoung asked that we give patient a Neb treatment.  Patient was given Neb Treatment and seen by Dr. Maple HudsonYoung.  Patient states he feels much better. Nothing further needed.

## 2015-09-11 NOTE — Addendum Note (Signed)
Addended by: York RamGAY, Rhonna Holster on: 09/11/2015 02:10 PM   Modules accepted: Orders

## 2015-09-11 NOTE — Progress Notes (Signed)
09/27/14 - 50 yoM never smoker referred courtesy of Dr Kennyth Arnold study on 04-30-14 On Zoloft. Wellbutrin, Concerta, Ritalin  for the past 5 years he complains of progressive daytime sleepiness. Bedtime between 8 and 10 PM, sleep latency 15 minutes, not aware of waking much during the night before up between 8 and 10 AM. He is using Ritalin and Concerta which helps some with his "focus" but not his sense of wakefulness. Adderall seemed to make him more sleepy. Caffeine has no effect. NPSG 04/30/14- positive for moderate obstructive sleep apnea, AHI 16.9 per hour with moderate snoring and desaturation to 86%. CPAP titrated to 14 CWP He has been using CPAP 14/Advanced/[employer] for purchase assistance, fullface mask. He says he is using CPAP all night every night but still craves sleep "like a drug". Has fallen asleep driving and sitting quietly on a few occasions. He stopped Ritalin and Concerta 10 days ago, anticipating this visit, with little dramatic change. No ENT surgery Thyroid and testosterone levels have been checked. He describes weakness/sleepiness onset with laughter, consistent with cataplexy. He denies symptoms consistent with sleep paralysis or hypnagogic hallucination. There is no family history of narcolepsy. He has been treated for depression. 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 Wellbutrin, Concerta, Ritalin CPAP 14/ Advanced ( machine has no modem so no Airview) FOLLOWS FOR: Wearing CPAP most every night-Download is attached to last OV notes. Pt is having a very hard time to stay awake during the day. Pt also noted he has used his Proair inhaler (200 inhalations) in one month and can not tell a difference in his breathing and notes he is wheezing today.   08/30/2015-50 year old male never smoker followed for OSA, hypersomnia, asthma,  complicated by GERD, depression On Zoloft. Wellbutrin, Concerta, Ritalin CPAP 14/[facility]  ( machine has no modem so no Airview) FOLLOWS FOR: Pt has stopped most meds - still taking Ritalin daily. Pt has not used CPAP machine x 2-3 months. States that his face mask has stretched and he has not been able to afford to replace it. Last asked for Focalin 11/28.  09/11/2015-50 year old male never smoker followed for OSA, hypersomnia, asthma, complicated by GERD, depression pt walked in c/o wheezing (audible), tightness, occ prod cough (has not looked at the mucus) x3-4days.  pt denies any hemoptysis, f/c/s, head congestion, PND.  reports Ventolin typically works for symptoms but he was unable to afford this month's inhaler and is requesting sample  ROS-see HPI Constitutional:   No-   weight loss, night sweats, fevers, chills, +fatigue, lassitude. HEENT:   No-  headaches, difficulty swallowing, tooth/dental problems, sore throat,       No-  sneezing, itching, ear ache, nasal congestion, post nasal drip,  CV:  No-   chest pain, orthopnea, PND, swelling in lower extremities, anasarca,                                                       dizziness, palpitations Resp: No-   shortness of breath with exertion or at rest.              No-   productive cough,  No non-productive cough,  No- coughing up of blood.  No-   change in color of mucus.  +wheezing.   Skin: No-   rash or lesions. GI:  No-   heartburn, indigestion, abdominal pain, nausea, vomiting,  GU: . MS:  No-   joint pain or swelling.   Neuro-     nothing unusual Psych:  No- change in mood or affect. No depression or anxiety.  No memory loss.  OBJ- Physical Exam  + overweight General- Alert, Oriented, Affect-appropriate, Distress- none acute, calm Skin- rash-none, lesions- none, excoriation- none Lymphadenopathy- none Head- atraumatic            Eyes- Gross vision intact, PERRLA, conjunctivae and secretions clear             Ears- Hearing, canals-normal            Nose- Clear, no-Septal dev, mucus, polyps, erosion, perforation             Throat- Mallampati III , mucosa clear , drainage- none, tonsils- atrophic Neck- + thick neck , trachea midline, no stridor , thyroid nl, carotid no bruit Chest - symmetrical excursion , unlabored           Heart/CV- RRR , no murmur , no gallop  , no rub, nl s1 s2                           - JVD- none , edema- none, stasis changes- none, varices- none           Lung- clear to P&A, wheeze+, cough- none , dullness-none, rub- none           Chest wall-  Abd-  Br/ Gen/ Rectal- Not done, not indicated Extrem- cyanosis- none, clubbing, none, atrophy- none, strength- nl Neuro- grossly intact to observation

## 2015-09-19 ENCOUNTER — Encounter: Payer: Self-pay | Admitting: Internal Medicine

## 2015-09-19 ENCOUNTER — Other Ambulatory Visit: Payer: Self-pay | Admitting: Internal Medicine

## 2015-09-19 MED ORDER — METHYLPHENIDATE HCL 20 MG PO TABS: ORAL_TABLET | ORAL | Status: DC

## 2015-09-19 NOTE — Telephone Encounter (Signed)
Rx ready, placed up front to pick up. Nothing further needed.

## 2015-09-19 NOTE — Telephone Encounter (Signed)
Pt is calling back pt is on his way to get it

## 2015-09-19 NOTE — Telephone Encounter (Signed)
Call Documentation      Waymon Budgelinton D Young, MD at 09/19/2015 12:50 PM     Status: Signed       Expand All Collapse All   Ok to refill            Tommie SamsMindy S Silva, CMA at 09/19/2015 11:56 AM     Status: Signed       Expand All Collapse All   Pt last had refill on ritalin 20 mg 09/05/15 Take up to 4 tablets daily #60 x 0 refills  Please advise Dr. Maple HudsonYoung thanks      ---   [facility] PLAC[facility] ON CD[facility] cart for signature

## 2015-09-19 NOTE — Telephone Encounter (Signed)
Ok to refill 

## 2015-09-19 NOTE — Telephone Encounter (Signed)
Pt returning phone call [phone]

## 2015-09-19 NOTE — Telephone Encounter (Signed)
Pt last had refill on ritalin 20 mg 09/05/15 Take up to 4 tablets daily #60 x 0 refills  Please advise Dr. Maple HudsonYoung thanks

## 2015-10-03 ENCOUNTER — Encounter: Payer: Self-pay | Admitting: Internal Medicine

## 2015-10-03 ENCOUNTER — Telehealth: Payer: Self-pay | Admitting: Internal Medicine

## 2015-10-03 MED ORDER — METHYLPHENIDATE HCL 20 MG PO TABS: ORAL_TABLET | ORAL | Status: DC

## 2015-10-03 NOTE — Addendum Note (Signed)
Addended by: Christen Butter on: 10/03/2015 02:24 PM   Modules accepted: Orders

## 2015-10-03 NOTE — Telephone Encounter (Signed)
Pt is requesting a refill on Ritalin. Last OV was on 09/11/15. Has pending OV on 09/01/16. Last refill was on 09/19/15 #60.  CY - please advise on refill. Thanks.

## 2015-10-03 NOTE — Telephone Encounter (Signed)
Rx printed  Spoke with the pt and he will pick up rx today  Nothing further needed

## 2015-10-03 NOTE — Telephone Encounter (Signed)
Ok to refill 

## 2015-10-03 NOTE — Telephone Encounter (Signed)
Please advise patient and this pharmacy that we are informed he is getting controlled meds from several different providers. We will not prescribe these for him any longer.

## 2015-10-03 NOTE — Telephone Encounter (Signed)
Called and advised patient that we received a call from a pharmacy that advised [place] that he is receiving his medications from other providers and that we are unable to provide him with these medications any longer per Dr. Maple Hudson.  Patient stated that he has been getting only the medications that is on our list.  Explained to patient that I am aware that he is getting the medications on the list, but the problem is that they printed out a Controlled Substance form and it flagged his account stating that he has been obtaining the same controlled substances from different providers.  Patient denied getting medications from other providers at first, then later added that he thinks the confusion is in that he was getting  of Ritalin from [facility].  I asked him when he stopped the  and if he was taking the  at the same time as the  that Dr. Maple Hudson was providing.  Advised him that we do not have this listed in his medication list.  Patient says that he doesn't "think that he was taking them at the same time".  I asked him when he stopped taking the  Ritalin and he said "it may have been around beginning of December".  Patient says that he does not use [facility] any longer.  I asked him when he stopped using [facility] and he says that he stopped using them in December.  I reminded him of his emergency visit here when he could not breath and he stated that he only "had an asthma attack".  Advised patient that overdosing on medication can also cause breathing problems, and it is very important that we know what medications he is taking for this reason.  Patient verbalized understanding.   Patient requesting that Dr. Maple Hudson reconsider writing his prescriptions and states that he will no longer use any other providers.  (Have not contacted pharmacy yet, awaiting Dr. Roxy Cedar decision before contacting pharmacy.)

## 2015-10-03 NOTE — Telephone Encounter (Signed)
Received a call from [facility];  She said that patient is receiving his Ritalin, Concerta and Focalin from several different physicians and different pharmacies.  Terry faxed over Controlled Substance Report for Dr. Maple Hudson to review.  (Patient sent message through MyChart today for another refill on Ritalin.)  FYI to Dr. Maple Hudson

## 2015-10-04 NOTE — Telephone Encounter (Signed)
Called home # and received message the caller is not accepting calls at this time. Called mobile # [id number] [phone]

## 2015-10-04 NOTE — Telephone Encounter (Signed)
I will not provide any controled or psychiatric medications for [name]. This includes bupropion, focalin, methylphenidate and sertraline. He will need to get these from his other provider.

## 2015-10-07 ENCOUNTER — Encounter: Payer: Self-pay | Admitting: Internal Medicine

## 2015-10-07 NOTE — Telephone Encounter (Signed)
I have relayed CY's response via MyChart. Nothing further was needed.

## 2015-10-24 ENCOUNTER — Encounter: Payer: Self-pay | Admitting: Internal Medicine

## 2015-10-24 NOTE — Telephone Encounter (Signed)
Forwarding message over to Dr. Maple Hudson

## 2015-10-28 NOTE — Telephone Encounter (Signed)
Called terry from previous phone note 10/03/15. The pharmacy is currently closed and will need to call back in the AM to [phone]

## 2015-10-28 NOTE — Telephone Encounter (Signed)
Please contact Aurther Loft (I believe is her name) from previous phone note asking her to re-fax a readable copy of all medications patient has been getting and who they were from. Thanks.

## 2015-10-29 ENCOUNTER — Encounter: Payer: Self-pay | Admitting: Internal Medicine

## 2015-10-29 NOTE — Telephone Encounter (Signed)
Report received and given to [facility]. Please advise thanks

## 2015-10-29 NOTE — Telephone Encounter (Signed)
I am not willing to prescribe stimulants for [name]. I was not aware that he was getting these same controlled drugs from two other providers Deatra Robinson and Arlana Hove while he was also getting them from this office. Both of these people are listed as psychiatric providers, so they can continue to meet his medication needs.

## 2015-10-29 NOTE — Telephone Encounter (Signed)
Called spoke with Aurther Loft. She reports she spoke with Florentina Addison and had faxed over a fax about how to get on the website. I asked her to re fax a readable copy and she will do so.

## 2015-10-30 NOTE — Telephone Encounter (Signed)
Pt has continued to message in after being advised of recs. Dr. Maple Hudson, can you please speak with patient to discuss further? thanks

## 2015-10-30 NOTE — Telephone Encounter (Signed)
For any controlled drugs, especially stimulants, and any psychiatric meds go to the mental health providers. Recommend that for asthma problems, go to primary care physicians at [facility].

## 2015-10-31 NOTE — Telephone Encounter (Signed)
Dr. Roxy Cedar recommendations sent to patient via mychart.  Nothing further needed.

## 2015-10-31 NOTE — Telephone Encounter (Signed)
Per mychart message: Thank you for your advice/instructions, but I am already doing what you are advising/instructing.  I already go to a mental provider [facility]), and [facility] is my primary care  provider. In fact, I have been going to [facility] and [place] far longer than I ever did Dr.  Maple Hudson. I was referred to Dr. Maple Hudson only because [facility] and [facility] are not set up to treat  my sleeping disorder(s). Thank you for advising/instructing me that Dr. Maple Hudson does not/will not  treat my asthma and mental health, but I was not referred to Dr. Maple Hudson for asthma and mental  health treatment; I was referred to Dr. Maple Hudson because I am crippled by drowsiness and sleepiness.  It is odd that I need to spell this out. After all, everything I am saying here (and have been saying  for weeks now) are surely in my chart and history.

## 2015-10-31 NOTE — Telephone Encounter (Signed)
Please advise Dr. Young thanks 

## 2015-11-01 ENCOUNTER — Encounter: Payer: Self-pay | Admitting: Internal Medicine

## 2015-11-01 NOTE — Telephone Encounter (Signed)
The only treatment for sleep problems he has been currently getting from [place] is the same stimulant drugs he is getting from his psychiatry providers. They can refer him to a sleep doctor at [facility] or perhaps at [facility] if they think appropriate. His asthma problem a while back can be managed by his family practice doctor.

## 2015-11-05 NOTE — Telephone Encounter (Signed)
2.28.17 mychart message from patient: Message     Hello:        PART ONE: Are you saying I should tell my psychiatrist that my sleep doctor says I should tell him/her (the psychiatrist) to refer me to another sleep doctor? With all due respect, that makes no sense. First of all, I do not see a psychiatrist. For psychiatric /ADD treatment I go to [facility]. I do not see a psychiatrist at [facility]. All [facility] does is medication management, and that is done by a nurse practitioner, not a psychiatrist/doctor. [facility] is how I got referred to Dr. Maple Hudson. I had already been under the care of both [facility] and [name] when I was referred to Dr. Maple Hudson.    *routing to Katie per her request*

## 2015-11-06 NOTE — Telephone Encounter (Signed)
Forwarding message to Florentina Addison at her request.

## 2015-11-06 NOTE — Telephone Encounter (Signed)
I have spoken with patient-he will come to see CY on Monday 11-12-15 at 10:!5am. Pt and CY are both aware of appt time/date. Nothing more needed at this time.

## 2015-11-11 ENCOUNTER — Encounter: Payer: Self-pay | Admitting: Internal Medicine

## 2015-11-11 ENCOUNTER — Other Ambulatory Visit (HOSPITAL_BASED_OUTPATIENT_CLINIC_OR_DEPARTMENT_OTHER): Payer: Self-pay

## 2015-11-11 ENCOUNTER — Ambulatory Visit (INDEPENDENT_AMBULATORY_CARE_PROVIDER_SITE_OTHER): Payer: Self-pay | Admitting: Internal Medicine

## 2015-11-11 VITALS — BP 118/70 | HR 101 | Ht 71.0 in | Wt 264.2 lb

## 2015-11-11 DIAGNOSIS — G4733 Obstructive sleep apnea (adult) (pediatric): Secondary | ICD-10-CM

## 2015-11-11 DIAGNOSIS — J4521 Mild intermittent asthma with (acute) exacerbation: Secondary | ICD-10-CM

## 2015-11-11 DIAGNOSIS — G4710 Hypersomnia, unspecified: Secondary | ICD-10-CM

## 2015-11-11 DIAGNOSIS — R400 Somnolence: Secondary | ICD-10-CM

## 2015-11-11 NOTE — Patient Instructions (Addendum)
Order- Refer to sleep tech for CPAP mask fitting  I am sorry we will not provide controlled drugs from this office

## 2015-11-11 NOTE — Progress Notes (Signed)
09/27/14 - [age] yoM never smoker referred courtesy of Dr Kennyth ArnoldEniola-sleep study on 04-30-14 On Zoloft. Wellbutrin, Concerta, Ritalin  for the past 5 years he complains of progressive daytime sleepiness. Bedtime between 8 and 10 PM, sleep latency 15 minutes, not aware of waking much during the night before up between 8 and 10 AM. He is using Ritalin and Concerta which helps some with his "focus" but not his sense of wakefulness. Adderall seemed to make him more sleepy. Caffeine has no effect. NPSG 04/30/14- positive for moderate obstructive sleep apnea, AHI 16.9 per hour with moderate snoring and desaturation to 86%. CPAP titrated to 14 CWP He has been using CPAP 14/Advanced/[employer] Association for purchase assistance, fullface mask. He says he is using CPAP all night every night but still craves sleep "like a drug". Has fallen asleep driving and sitting quietly on a few occasions. He stopped Ritalin and Concerta 10 days ago, anticipating this visit, with little dramatic change. No ENT surgery Thyroid and testosterone levels have been checked. He describes weakness/sleepiness onset with laughter, consistent with cataplexy. He denies symptoms consistent with sleep paralysis or hypnagogic hallucination. There is no family history of narcolepsy. He has been treated for depression. 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 Living alone  11/08/14- 48 yoM never smoker followed for OSA, hypersomnia, asthma, complicated by GERD, depression On Zoloft. Wellbutrin, Concerta, Ritalin CPAP 14/ Advanced ( machine has no modem so no Airview) FOLLOWS FOR: Wearing CPAP most every night-Download is attached to last OV notes. Pt is having a very hard time to stay awake during the day. Pt also noted he has used his Proair inhaler (200 inhalations) in one month and can not tell a difference in his breathing and notes he is wheezing today.   08/30/2015-50 year old male never smoker followed for OSA, hypersomnia, asthma,  complicated by GERD, depression On Zoloft. Wellbutrin, Concerta, Ritalin CPAP 14/Advanced  ( machine has no modem so no Airview) FOLLOWS FOR: Pt has stopped most meds - still taking Ritalin daily. Pt has not used CPAP machine x 2-3 months. States that his face mask has stretched and he has not been able to afford to replace it. Last asked for Focalin 11/28.  09/11/2015-50 year old male never smoker followed for OSA, hypersomnia, asthma, complicated by GERD, depression pt walked in c/o wheezing (audible), tightness, occ prod cough (has not looked at the mucus) x3-4days.  pt denies any hemoptysis, f/c/s, head congestion, PND.  reports Ventolin typically works for symptoms but he was unable to afford this month's inhaler and is requesting sample  11/11/2015-50 year old male never smoker followed for OSA, hypersomnia, asthma, complicated by GERD, depression He was sent a certified letter informing him that we will no longer prescribe controlled drugs from this office, after we received a report from pharmacy that he was getting the same medications from multiple providers. FOLLOWS FOR: Pt has not been able to to wear CPAP due to not having a mask. CPAP 14/Advanced(machine has no modem so Airview) He states he is close to graduating, has trouble focusing on exams, tired at night and feels unsafe taking bus home. I explained he still would need to get any stimulant/controlled drugs from his Behavioral [facility] care [facility].  ROS-see HPI Constitutional:   No-   weight loss, night sweats, fevers, chills, +fatigue, lassitude. HEENT:   No-  headaches, difficulty swallowing, tooth/dental problems, sore throat,       No-  sneezing, itching, ear ache, nasal congestion, post nasal drip,  CV:  No-   chest pain, orthopnea, PND, swelling in lower extremities, anasarca,                                                       dizziness, palpitations Resp: No-   shortness of breath with exertion or at rest.               No-   productive cough,  No non-productive cough,  No- coughing up of blood.              No-   change in color of mucus.  +wheezing.   Skin: No-   rash or lesions. GI:  No-   heartburn, indigestion, abdominal pain, nausea, vomiting,  GU: . MS:  No-   joint pain or swelling.   Neuro-     nothing unusual Psych:  No- change in mood or affect. No depression or anxiety.  No memory loss.  OBJ- Physical Exam  + overweight General- Alert, Oriented, Affect-appropriate, Distress- none acute, calm, + overweight Skin- rash-none, lesions- none, excoriation- none Lymphadenopathy- none Head- atraumatic            Eyes- Gross vision intact, PERRLA, conjunctivae and secretions clear            Ears- Hearing, canals-normal            Nose- Clear, no-Septal dev, mucus, polyps, erosion, perforation             Throat- Mallampati III , mucosa clear , drainage- none, tonsils- atrophic Neck- + thick neck , trachea midline, no stridor , thyroid nl, carotid no bruit Chest - symmetrical excursion , unlabored           Heart/CV- RRR , no murmur , no gallop  , no rub, nl s1 s2                           - JVD- none , edema- none, stasis changes- none, varices- none           Lung- clear to P&A, wheeze+, cough- none , dullness-none, rub- none           Chest wall-  Abd-  Br/ Gen/ Rectal- Not done, not indicated Extrem- cyanosis- none, clubbing, none, atrophy- none, strength- nl Neuro- grossly intact to observation

## 2015-11-12 NOTE — Assessment & Plan Note (Signed)
He complains of residual daytime sleepiness even when using CPAP appropriately. He never seems tired at his visits here and symptoms may actually reflect his depression. When he is able, it may be appropriate to repeat an overnight sleep study followed by a daytime Multiple Sleep Latency Test off of stimulant medications.

## 2015-11-12 NOTE — Assessment & Plan Note (Signed)
No recurrence after acute episode which may have been viral triggered. He has sample rescue inhaler but not needing it at all.

## 2015-11-12 NOTE — Assessment & Plan Note (Signed)
He says CPAP did help some and that only problem now is lack of mask. Plan-we again reviewed basic sleep hygiene and discussed purpose of CPAP. [facility] with a sample mask so he can resume use of CPAP.

## 2016-01-06 ENCOUNTER — Encounter: Payer: Self-pay | Admitting: Family Medicine

## 2016-01-06 ENCOUNTER — Encounter: Payer: Self-pay | Admitting: Internal Medicine

## 2016-01-06 DIAGNOSIS — G4733 Obstructive sleep apnea (adult) (pediatric): Secondary | ICD-10-CM

## 2016-01-06 NOTE — Telephone Encounter (Signed)
CY - please advise. Thanks! 

## 2016-01-06 NOTE — Addendum Note (Signed)
Addended by: Velvet BatheAULFIELD, Coy Rochford L on: 01/06/2016 05:17 PM   Modules accepted: Orders

## 2016-01-06 NOTE — Telephone Encounter (Signed)
We can request download from his CPAP per DME    Dx OSA

## 2016-01-06 NOTE — Telephone Encounter (Signed)
Will forward to MD.  

## 2016-01-07 ENCOUNTER — Other Ambulatory Visit: Payer: Self-pay | Admitting: Family Medicine

## 2016-01-07 ENCOUNTER — Other Ambulatory Visit: Payer: Self-pay

## 2016-01-07 DIAGNOSIS — D649 Anemia, unspecified: Secondary | ICD-10-CM

## 2016-01-07 LAB — CBC WITH DIFFERENTIAL/PLATELET
Basophils Absolute: 78 cells/uL (ref 0–200)
Basophils Relative: 1 %
Eosinophils Absolute: 546 cells/uL — ABNORMAL HIGH (ref 15–500)
Eosinophils Relative: 7 %
HCT: 31 % — ABNORMAL LOW (ref 38.5–50.0)
Hemoglobin: 9.2 g/dL — ABNORMAL LOW (ref 13.2–17.1)
Lymphocytes Relative: 18 %
Lymphs Abs: 1404 cells/uL (ref 850–3900)
MCH: 19.4 pg — ABNORMAL LOW (ref 27.0–33.0)
MCHC: 29.7 g/dL — ABNORMAL LOW (ref 32.0–36.0)
MCV: 65.3 fL — ABNORMAL LOW (ref 80.0–100.0)
MPV: 9.3 fL (ref 7.5–12.5)
Monocytes Absolute: 702 cells/uL (ref 200–950)
Monocytes Relative: 9 %
Neutro Abs: 5070 cells/uL (ref 1500–7800)
Neutrophils Relative %: 65 %
Platelets: 409 10*3/uL — ABNORMAL HIGH (ref 140–400)
RBC: 4.75 MIL/uL (ref 4.20–5.80)
RDW: 17.5 % — ABNORMAL HIGH (ref 11.0–15.0)
WBC: 7.8 10*3/uL (ref 3.8–10.8)

## 2016-01-07 NOTE — Progress Notes (Signed)
Patient scheduled for this afternoon at 3pm for his CBC. Jazmin Hartsell,CMA

## 2016-01-07 NOTE — Progress Notes (Signed)
Good morning:    It would be more convenient if you would place a lab order.    About [facility]: I go there for treatment (injections/infusions) because [facility] referred me to them for treatment. At the time, [facility] would only give me blood transfusions (not injections/not infusions), so [facility]inf[facility]).    Thank you,   [name]   ----- Message -----   From: Janit PaganENIOLA, Morganna Styles, MD   Sent: 01/07/2016 5:57 AM EDT   To: [name]   Subject: RE: Non-Urgent Medical Question      I meant your last visit to us for anemia was 2014. It will be nice if you can come in to see us. If you are unable to come in I can place a lab order. Are you still getting fereheme injection at [facility]? Do you prefer to continue seeing them for your anemia evaluation and treatment. From you e-mail, it seems you see them most recently.      ----- Message -----    From: Charna ArcherHODGIN,Akif W    Sent: 01/06/2016 9:10 PM EDT     To: Janit PaganENIOLA, Andjela Wickes, MD   Subject: RE: Non-Urgent Medical Question      Hello:   Do I schedule for a lab visit or an office visit?    You wrote, "Your last anemia visit was in 2014." Actually, my last anemia visit was [facility], 2014 [facility] with Dr. Gardiner CoinsJohn Owen (Hematology and Oncology). I am not sure if you have access to that information.   Thank you,   [name]   ----- Message -----   From: Janit PaganENIOLA, Jady Braggs, MD   Sent: 01/06/2016 7:52 PM EDT   To: [name]   Subject: RE: Non-Urgent Medical Question      We can get blood test done. Please schedule an appointment as soon as possible to be assessed for Anemia. Your last anemia visit was in 2014. If symptoms worsens while you are not able to get in, please go to the ED.      ----- Message -----    From: Charna ArcherHODGIN,Marx W    Sent: 01/06/2016 12:34 PM EDT     To: Janit PaganENIOLA, Makeba Delcastillo, MD   Subject:  Non-Urgent Medical Question      Hello:   Recently I have again been having anemia symptoms. May I get a blood test?    Thank you,   [name]

## 2016-01-07 NOTE — Progress Notes (Signed)
Cbc with diff done today [name] 

## 2016-01-08 ENCOUNTER — Encounter: Payer: Self-pay | Admitting: Family Medicine

## 2016-01-08 NOTE — Telephone Encounter (Signed)
Patient is aware of results and will plan to call [facility]. Burnard HawthorneJazmin Hartsell,CMA

## 2016-01-08 NOTE — Telephone Encounter (Signed)
[place] a DL to my attention. Thanks.   Spoke with rep at [facility]-states that patient has not had any contact with them for DL's since [id number]-only for supplies and did not take items due to financial concerns-no insurance. Pt can contact [facility] to set up time to have DL from CPAP machine. (CPAP machine is from AMA-patient assistance program for uninsured).   I have sent e-mail to patient requesting he contact [facility] to get appt for DL of his machine. This will be the only way we can see if his CPAP and pressure setting(s) are working well for him.

## 2016-01-08 NOTE — Telephone Encounter (Signed)
No download has been rec'd as of today. I was not aware of any DL's coming to us on this patient. I have requested [facility] send

## 2016-01-08 NOTE — Telephone Encounter (Signed)
-----   Message from Doreene ElandKehinde T Eniola, MD sent at 01/08/2016  7:50 AM EDT ----- Please contact patient that his hemoglobin dropped from 14 one yr ago to 9. Advice him to schedule appointment with [facility] for iron infusion as he usually does. If feeling dizzy or weak or bleeding advise him to go to the hospital.

## 2016-01-08 NOTE — Telephone Encounter (Signed)
Katie, did you ever received DL? He is emailing again today

## 2016-01-08 NOTE — Progress Notes (Signed)
Patient ID: Jeffery ArcherRobert W Rice, male   DOB: 04-10-1966, 50 y.o.   MRN: [id number] Message  Received: Today    Steva ColderEmily P Scott, CMA  Doreene ElandKehinde T Codey Burling, MD            Pt informed and has made an apt for his blood infusions.       Previous Messages     ----- Message -----   From: Doreene ElandKehinde T Nalee Lightle, MD   Sent: 01/08/2016 11:05 AM    To: Steva ColderEmily P Scott, CMA   Please contact patient that his hemoglobin dropped from 14 one yr ago to 9. Advice him to schedule appointment with [facility] for iron infusion as he usually does. If feeling dizzy or weak or bleeding advise him to go to the hospital.   Thanks.  Mickaela Starlin

## 2016-01-14 NOTE — Telephone Encounter (Signed)
CY have you received this download? Thanks.

## 2016-01-14 NOTE — Telephone Encounter (Signed)
Please call DME for this download report for me

## 2016-01-16 NOTE — Telephone Encounter (Signed)
Triage, can you call [facility] in the morning to get this pt's download faxed over?  Thank you!

## 2016-01-17 ENCOUNTER — Encounter: Payer: Self-pay | Admitting: Family Medicine

## 2016-01-17 ENCOUNTER — Ambulatory Visit (INDEPENDENT_AMBULATORY_CARE_PROVIDER_SITE_OTHER): Payer: Self-pay | Admitting: Family Medicine

## 2016-01-17 VITALS — BP 122/70 | HR 95 | Temp 98.5°F

## 2016-01-17 DIAGNOSIS — R062 Wheezing: Secondary | ICD-10-CM

## 2016-01-17 DIAGNOSIS — J45901 Unspecified asthma with (acute) exacerbation: Secondary | ICD-10-CM

## 2016-01-17 MED ORDER — METHYLPREDNISOLONE SODIUM SUCC 125 MG IJ SOLR
125.0000 mg | Freq: Once | INTRAMUSCULAR | Status: AC
  Administered 2016-01-17: 125 mg via INTRAMUSCULAR

## 2016-01-17 MED ORDER — PREDNISONE 10 MG PO TABS: 10.0000 mg | ORAL_TABLET | Freq: Every day | ORAL | Status: DC

## 2016-01-17 MED ORDER — ALBUTEROL SULFATE (2.5 MG/3ML) 0.083% IN NEBU
2.5000 mg | INHALATION_SOLUTION | Freq: Once | RESPIRATORY_TRACT | Status: AC
  Administered 2016-01-17: 2.5 mg via RESPIRATORY_TRACT

## 2016-01-17 MED ORDER — ALBUTEROL SULFATE (2.5 MG/3ML) 0.083% IN NEBU: 2.5000 mg | INHALATION_SOLUTION | Freq: Four times a day (QID) | RESPIRATORY_TRACT | Status: DC | PRN

## 2016-01-17 NOTE — Progress Notes (Signed)
Date of Visit: 01/17/2016   HPI:  Patient presents for a same day walk-in appointment to discuss asthma attack. Walked in to our clinic at 4:30pm today complaining of asthma attack. I was precepting and was asked to see him.  Has been waiting all day for a ride to be able to come to the clinic. Patient reports asthma attack has been ongoing for 1 week. Has used albuterol inhaler without significant relief. Reports history of asthma attacks about once per year.  Mild cough. No fever. No leg swelling. No chest pain, just tightness typical of his asthma. Does not take any asthma controller medications. Normally uses albuterol just a couple times a month.  ROS: See HPI  PMFSH: history of depression, GERD, asthma, ADHD, low testosterone, iron deficiency anemia, OSA  PHYSICAL EXAM: BP 122/70 mmHg  Pulse 95  Temp(Src) 98.5 F (36.9 C) (Oral)  SpO2 98% Gen: initially with tachypnea and audible wheezing, in mild distress.  HEENT: normocephalic, atraumatic. Moist mucous membranes. Nares patent Heart: regular rate and rhythm. No murmur Lungs: diffuse expiratory wheezes bilaterally, tachypneic, mild respiratory distress Neuro: alert, grossly nonfocal, speech normal Extremities: calves nontender to palpation   ASSESSMENT/PLAN:  1. Asthma exacerbation: Initially patient unable to ambulate in clinic without stopping to rest. O2 sat remained 98% during this time. After administration of one albuterol neb, much improved work of breathing, with less wheezing and better air movement. Able to ambulate comfortably around clinic without resting after neb treatment. States feels much better and ready to go home. Discussed option of getting him a nebulizer machine from our clinic today but he ultimately stated he was not able to afford the machine.  Plan: - solumedrol 125mg  IM now - continue albuterol inhaler 2puffs q4h as needed - prednisone 10mg  daily for 10 days (pt states he needs low dose prednisone,  requested 10mg  tabs specifically) - follow up next week to ensure doing better - if any worsening at all instructed to go to ED this weekend  FOLLOW UP: Follow up in 1 week for asthma.  GrenadaBrittany J. Pollie MeyerMcIntyre, MD [facility]

## 2016-01-17 NOTE — Telephone Encounter (Signed)
[facility] and spoke with Dois DavenportSandra. She will be faxing this report over to us >> attn Katie.

## 2016-01-17 NOTE — Patient Instructions (Addendum)
Today we treated you with a breathing treatment (nebulized albuterol) We are also giving you a steroid shot to help calm down your lungs  Continue using your albuterol inhaler 2 puffs every 4 hours as needed. Sending prednisone  daily for 10 days.  If your breathing does not feel better or worsens at all please go to the ER this weekend  Follow up next week here at [facility] to see how you are doing.  Be well, Dr. Pollie Meyer   Asthma, Acute Bronchospasm Acute bronchospasm caused by asthma is also referred to as an asthma attack. Bronchospasm means your air passages become narrowed. The narrowing is caused by inflammation and tightening of the muscles in the air tubes (bronchi) in your lungs. This can make it hard to breathe or cause you to wheeze and cough. CAUSES Possible triggers are:  Animal dander from the skin, hair, or feathers of animals.  Dust mites contained in house dust.  Cockroaches.  Pollen from trees or grass.  Mold.  Cigarette or tobacco smoke.  Air pollutants such as dust, household cleaners, hair sprays, aerosol sprays, paint fumes, strong chemicals, or strong odors.  Cold air or weather changes. Cold air may trigger inflammation. Winds increase molds and pollens in the air.  Strong emotions such as crying or laughing hard.  Stress.  Certain medicines such as aspirin or beta-blockers.  Sulfites in foods and drinks, such as dried fruits and wine.  Infections or inflammatory conditions, such as a flu, cold, or inflammation of the nasal membranes (rhinitis).  Gastroesophageal reflux disease (GERD). GERD is a condition where stomach acid backs up into your esophagus.  Exercise or strenuous activity. SIGNS AND SYMPTOMS   Wheezing.  Excessive coughing, particularly at night.  Chest tightness.  Shortness of breath. DIAGNOSIS  Your health care provider will ask you about your medical history and perform a physical exam. A chest X-ray or  blood testing may be performed to look for other causes of your symptoms or other conditions that may have triggered your asthma attack. TREATMENT  Treatment is aimed at reducing inflammation and opening up the airways in your lungs. Most asthma attacks are treated with inhaled medicines. These include quick relief or rescue medicines (such as bronchodilators) and controller medicines (such as inhaled corticosteroids). These medicines are sometimes given through an inhaler or a nebulizer. Systemic steroid medicine taken by mouth or given through an IV tube also can be used to reduce the inflammation when an attack is moderate or severe. Antibiotic medicines are only used if a bacterial infection is present.  HOME CARE INSTRUCTIONS   Rest.  Drink plenty of liquids. This helps the mucus to remain thin and be easily coughed up. Only use caffeine in moderation and do not use alcohol until you have recovered from your illness.  Do not smoke. Avoid being exposed to secondhand smoke.  You play a critical role in keeping yourself in good health. Avoid exposure to things that cause you to wheeze or to have breathing problems.  Keep your medicines up-to-date and available. Carefully follow your health care provider's treatment plan.  Take your medicine exactly as prescribed.  When pollen or pollution is bad, keep windows closed and use an air conditioner or go to places with air conditioning.  Asthma requires careful medical care. See your health care provider for a follow-up as advised. If you are more than [redacted] weeks pregnant and you were prescribed any new medicines, let your obstetrician know about the  visit and how you are doing. Follow up with your health care provider as directed.  After you have recovered from your asthma attack, make an appointment with your outpatient doctor to talk about ways to reduce the likelihood of future attacks. If you do not have a doctor who manages your asthma, make  an appointment with a primary care doctor to discuss your asthma. SEEK IMMEDIATE MEDICAL CARE IF:   You are getting worse.  You have trouble breathing. If severe, call your local emergency services (911 in the [place]  You develop chest pain or discomfort.  You are vomiting.  You are not able to keep fluids down.  You are coughing up yellow, green, brown, or bloody sputum.  You have a fever and your symptoms suddenly get worse.  You have trouble swallowing. MAKE SURE YOU:   Understand these instructions.  Will watch your condition.  Will get help right away if you are not doing well or get worse.   This information is not intended to replace advice given to you by your health care provider. Make sure you discuss any questions you have with your health care provider.   Document Released: 12/09/2006 Document Revised: 08/29/2013 Document Reviewed: 03/01/2013 Elsevier Interactive Patient Education [employer]2016 Elsevier Inc.

## 2016-01-21 ENCOUNTER — Telehealth: Payer: Self-pay | Admitting: Internal Medicine

## 2016-01-21 ENCOUNTER — Encounter: Payer: Self-pay | Admitting: *Deleted

## 2016-01-21 NOTE — Telephone Encounter (Signed)
error 

## 2016-01-30 ENCOUNTER — Encounter: Payer: Self-pay | Admitting: Internal Medicine

## 2016-08-20 ENCOUNTER — Encounter: Payer: Self-pay | Admitting: Internal Medicine

## 2016-09-01 ENCOUNTER — Ambulatory Visit: Payer: Self-pay | Admitting: Internal Medicine

## 2016-09-29 ENCOUNTER — Encounter: Payer: Self-pay | Admitting: Internal Medicine

## 2016-09-29 ENCOUNTER — Encounter: Payer: Self-pay | Admitting: Family Medicine

## 2016-09-29 NOTE — Telephone Encounter (Signed)
CY - please advise. Thanks! 

## 2016-10-02 NOTE — Telephone Encounter (Signed)
Sleep is not so much the issue. The problem is when a person becomes obsessed about sleep, which is a problem dealt with by psychiatrists and psychologists.  [name] had been seeing someone for behavioral health issues. Please recommend he o back to them.

## 2016-10-02 NOTE — Telephone Encounter (Signed)
CY  Please Advise- pt. e-mail below

## 2016-10-15 ENCOUNTER — Ambulatory Visit: Payer: Self-pay

## 2016-11-04 ENCOUNTER — Other Ambulatory Visit: Payer: Self-pay | Admitting: Family Medicine

## 2016-11-04 ENCOUNTER — Encounter: Payer: Self-pay | Admitting: Family Medicine

## 2016-11-04 ENCOUNTER — Encounter: Payer: Self-pay | Admitting: Internal Medicine

## 2016-11-04 ENCOUNTER — Encounter: Payer: Self-pay | Admitting: *Deleted

## 2016-11-04 DIAGNOSIS — R5383 Other fatigue: Secondary | ICD-10-CM

## 2016-11-04 NOTE — Progress Notes (Signed)
mychart message sent to patient asking him what time and which day he would like for me to add him to the lab schedule. Jazmin Hartsell,CMA

## 2016-11-04 NOTE — Progress Notes (Signed)
C/O chronic fatigue. He is requesting hemoglobin check. I encouraged him to make an appointment to see me soon for full evaluation.

## 2016-11-10 ENCOUNTER — Ambulatory Visit: Payer: Self-pay | Admitting: Internal Medicine

## 2016-11-13 ENCOUNTER — Telehealth: Payer: Self-pay

## 2016-11-13 ENCOUNTER — Encounter: Payer: Self-pay | Admitting: Family Medicine

## 2016-11-13 ENCOUNTER — Other Ambulatory Visit (INDEPENDENT_AMBULATORY_CARE_PROVIDER_SITE_OTHER): Payer: Self-pay

## 2016-11-13 DIAGNOSIS — R5383 Other fatigue: Secondary | ICD-10-CM

## 2016-11-13 LAB — POCT HEMOGLOBIN: Hemoglobin: 12.9 g/dL — AB (ref 14.1–18.1)

## 2016-11-13 NOTE — Telephone Encounter (Signed)
-----   Message from Doreene ElandKehinde T Eniola, MD sent at 11/13/2016  3:57 PM EST ----- Please contact patient to let him know that his hemoglobin looks ok. F/U if he has any additional question.

## 2016-11-13 NOTE — Telephone Encounter (Signed)
Pt informed of WNL hemoglobin.

## 2017-09-07 ENCOUNTER — Emergency Department (HOSPITAL_COMMUNITY): Payer: Self-pay

## 2017-09-07 ENCOUNTER — Other Ambulatory Visit: Payer: Self-pay

## 2017-09-07 ENCOUNTER — Encounter (HOSPITAL_COMMUNITY): Payer: Self-pay | Admitting: Emergency Medicine

## 2017-09-07 ENCOUNTER — Emergency Department (HOSPITAL_COMMUNITY)
Admission: EM | Admit: 2017-09-07 | Discharge: 2017-09-07 | Disposition: A | Discharge status: Home / Self Care | Payer: Self-pay | Attending: Physician Assistant | Admitting: Physician Assistant

## 2017-09-07 DIAGNOSIS — F909 Attention-deficit hyperactivity disorder, unspecified type: Secondary | ICD-10-CM | POA: Insufficient documentation

## 2017-09-07 DIAGNOSIS — J4520 Mild intermittent asthma, uncomplicated: Secondary | ICD-10-CM | POA: Insufficient documentation

## 2017-09-07 DIAGNOSIS — R5382 Chronic fatigue, unspecified: Secondary | ICD-10-CM | POA: Insufficient documentation

## 2017-09-07 DIAGNOSIS — Z79899 Other long term (current) drug therapy: Secondary | ICD-10-CM | POA: Insufficient documentation

## 2017-09-07 LAB — CBC
HCT: 41.3 % (ref 39.0–52.0)
Hemoglobin: 13.3 g/dL (ref 13.0–17.0)
MCH: 26.1 pg (ref 26.0–34.0)
MCHC: 32.2 g/dL (ref 30.0–36.0)
MCV: 81 fL (ref 78.0–100.0)
PLATELETS: 269 10*3/uL (ref 150–400)
RBC: 5.1 MIL/uL (ref 4.22–5.81)
RDW: 13.4 % (ref 11.5–15.5)
WBC: 7.6 10*3/uL (ref 4.0–10.5)

## 2017-09-07 LAB — BASIC METABOLIC PANEL WITH GFR
Anion gap: 6 (ref 5–15)
BUN: 18 mg/dL (ref 6–20)
CO2: 22 mmol/L (ref 22–32)
Calcium: 9.1 mg/dL (ref 8.9–10.3)
Chloride: 108 mmol/L (ref 101–111)
Creatinine, Ser: 1.17 mg/dL (ref 0.61–1.24)
GFR calc Af Amer: >60 mL/min
GFR calc non Af Amer: >60 mL/min
Glucose, Bld: 92 mg/dL (ref 65–99)
Potassium: 4.4 mmol/L (ref 3.5–5.1)
Sodium: 136 mmol/L (ref 135–145)

## 2017-09-07 LAB — URINALYSIS, ROUTINE W REFLEX MICROSCOPIC
Bacteria, UA: NONE SEEN
Bilirubin Urine: NEGATIVE
Glucose, UA: NEGATIVE mg/dL
Ketones, ur: NEGATIVE mg/dL
Leukocytes, UA: NEGATIVE
Nitrite: NEGATIVE
Protein, ur: NEGATIVE mg/dL
RBC / HPF: NONE SEEN RBC/hpf (ref 0–5)
Specific Gravity, Urine: 1.008 (ref 1.005–1.030)
Squamous Epithelial / HPF: NONE SEEN
pH: 5 (ref 5.0–8.0)

## 2017-09-07 LAB — CBG MONITORING, ED: Glucose-Capillary: 75 mg/dL (ref 65–99)

## 2017-09-07 NOTE — ED Triage Notes (Signed)
Pt reports weakness and fatigue x 1 month. Reports hx of recurrent anemia. Missed work on Friday due to symptoms. [facility]. Has not been seen by PCP in last month. Pt is alert, ambulatory. Drove self to hospital

## 2017-09-07 NOTE — Discharge Instructions (Signed)
Please try wearing her CPAP daily (nightly) for the next 2 weeks and keep track of your fatigue.  Please follow-up with your primary care physician.

## 2017-09-07 NOTE — ED Notes (Signed)
Pt will be discharged from room once Urinalysis comes back per ED Provider.

## 2017-09-07 NOTE — ED Notes (Signed)
Writer informed patient that a urine sample was needed and patient stated "I just peed before I was brought back to room 25". Patient made aware a urine sample is needed and a urinal is at bedside.

## 2017-09-07 NOTE — ED Provider Notes (Signed)
[facility]-EMERGENCY DEPT Provider Note   CSN: [id number] Arrival date & time: 09/07/17  1138     History   Chief Complaint Chief Complaint  Patient presents with  . Fatigue    weakness x 1 month    HPI [name] is a 52 y.o. male.  HPI   Patient is a 52 year old male with past medical history significant for depression constipation, exertional dyspnea secondary to anemia, daytime somnolence, history of obstructive sleep apnea, iron deficiency anemia.  He is presenting today with fatigue.  He says "I know my body and I think I have low hemoglobin.".  Patient reports that he has been feeling more fatigued than usual.  He reports noncompliance with his CPAP at night.  Denies any chest pain or fevers.  Denies any urinary complaints or cough.  Past Medical History:  Diagnosis Date  . ADD (attention deficit disorder)   . ADD (attention deficit disorder)   . Anemia, iron deficiency   . Asthma   . Blood transfusion   . Constipation   . Depression   . Depression   . Exertional dyspnea    secondary to anemia  . Hiatal hernia     Patient Active Problem List   Diagnosis Date Noted  . Daytime somnolence 07/29/2014  . OSA (obstructive sleep apnea) 05/29/2014  . Iron deficiency anemia 12/22/2013  . Low serum testosterone level 07/31/2013  . Fatigue 07/07/2013  . Adult ADHD 07/07/2013  . Dyspnea 03/01/2012  . Anemia 01/07/2012  . Depression 01/07/2012  . GERD (gastroesophageal reflux disease) 01/07/2012  . Mild intermittent asthma with acute exacerbation 01/07/2012    History reviewed. No pertinent surgical history.     Home Medications    Prior to Admission medications   Medication Sig Start Date End Date Taking? Authorizing Provider  sertraline (ZOLOFT) 50 MG tablet Take 50 mg by mouth daily.    Yes [provider]  albuterol (PROVENTIL) (2.5 MG/3ML) 0.083% nebulizer solution Take 3 mLs (2.5 mg total) by nebulization every 6  (six) hours as needed for wheezing or shortness of breath. Patient not taking: Reported on 09/07/2017 01/17/16   Latrelle Dodrill, MD  VENTOLIN HFA 108 (90 BASE) MCG/ACT inhaler INHALE 2 PUFFS EVERY 6 HOURS AS NEEDED FOR WHEEZING OR SHORTNESS OF BREATH 05/20/15   [name], MD    Family History Family History  Problem Relation Age of Onset  . Heart disease Mother   . Heart disease Father   . Diabetes Paternal Grandfather   . Cancer Neg Hx   . Stroke Neg Hx     Social History Social History   Tobacco Use  . Smoking status: Never Smoker  . Smokeless tobacco: Never Used  Substance Use Topics  . Alcohol use: No  . Drug use: No     Allergies   Patient has no known allergies.   Review of Systems Review of Systems  Constitutional: Positive for fatigue. Negative for diaphoresis and fever.  HENT: Negative for congestion.   Respiratory: Negative for choking, chest tightness and shortness of breath.   Cardiovascular: Negative for chest pain.  Gastrointestinal: Negative for abdominal pain.  Genitourinary: Negative for dysuria, frequency and hematuria.  All other systems reviewed and are negative.    Physical Exam Updated Vital Signs BP (!) 133/94   Pulse 71   Temp 97.6 F (36.4 C) (Oral)   Resp 17   Wt 117.9 kg (260 lb)   SpO2 99%   BMI  36.26 kg/m   Physical Exam  Constitutional: He is oriented to person, place, and time. He appears well-nourished.  HENT:  Head: Normocephalic.  Eyes: Conjunctivae and EOM are normal.  Neck: Normal range of motion.  Cardiovascular: Normal rate and regular rhythm.  No murmur heard. Pulmonary/Chest: Effort normal and breath sounds normal. No respiratory distress.  Abdominal: Soft. He exhibits no distension. There is no tenderness.  Neurological: He is oriented to person, place, and time.  Skin: Skin is warm and dry. He is not diaphoretic.  Psychiatric: He has a normal mood and affect. His behavior is normal.     ED  Treatments / Results  Labs (all labs ordered are listed, but only abnormal results are displayed) Labs Reviewed  BASIC METABOLIC PANEL  CBC  URINALYSIS, ROUTINE W REFLEX MICROSCOPIC  CBG MONITORING, ED    EKG  EKG Interpretation  Date/Time:  Tuesday September 07 2017 12:09:21 EST Ventricular Rate:  81 PR Interval:    QRS Duration: 96 QT Interval:  357 QTC Calculation: 415 R Axis:   107 Text Interpretation:  Sinus rhythm Probable left atrial enlargement Right axis deviation Normal sinus rhythm Confirmed by Corlis LeakMackuen, Courteney ([id number]) on 09/07/2017 3:27:53 PM       Radiology No results found.  Procedures Procedures (including critical care time)  Medications Ordered in ED Medications - No data to display   Initial Impression / Assessment and Plan / ED Course  I have reviewed the triage vital signs and the nursing notes.  Pertinent labs & imaging results that were available during my care of the patient were reviewed by me and considered in my medical decision making (see chart for details).     Patient is a 52 year old male with past medical history significant for depression constipation, exertional dyspnea secondary to anemia, daytime somnolence, history of obstructive sleep apnea, iron deficiency anemia.  He is presenting today with fatigue.  He says "I know my body and I think I have low hemoglobin.".  Patient reports that he has been feeling more fatigued than usual.  He reports noncompliance with his CPAP at night.  Denies any chest pain or fevers.  Denies any urinary complaints or cough.  3:56 PM I informed patient that his hemoglobin is normal.  I think this is likely secondary to depression versus obstructive sleep apnea causing excessive daytime sleepiness.  Will get chest x-ray to make sure he does not have occult pneumonia.  Otherwise plan to have him follow-up with his primary care physician.  In the absence of chest pain, and normal labs and vital signs and not  see any reason for admission at this time.  Final Clinical Impressions(s) / ED Diagnoses   Final diagnoses:  None    ED Discharge Orders    None       Abelino DerrickMackuen, Courteney Lyn, MD 09/07/17 1557

## 2017-09-09 ENCOUNTER — Encounter: Payer: Self-pay | Admitting: Family Medicine

## 2017-09-14 ENCOUNTER — Encounter: Payer: Self-pay | Admitting: Family Medicine

## 2017-09-14 ENCOUNTER — Ambulatory Visit (INDEPENDENT_AMBULATORY_CARE_PROVIDER_SITE_OTHER): Payer: Self-pay | Admitting: Family Medicine

## 2017-09-14 ENCOUNTER — Other Ambulatory Visit: Payer: Self-pay

## 2017-09-14 VITALS — BP 130/78 | HR 81 | Temp 97.8°F | Ht 71.0 in | Wt 273.0 lb

## 2017-09-14 DIAGNOSIS — F329 Major depressive disorder, single episode, unspecified: Secondary | ICD-10-CM

## 2017-09-14 DIAGNOSIS — G47429 Narcolepsy in conditions classified elsewhere without cataplexy: Secondary | ICD-10-CM

## 2017-09-14 DIAGNOSIS — W57XXXA Bitten or stung by nonvenomous insect and other nonvenomous arthropods, initial encounter: Secondary | ICD-10-CM

## 2017-09-14 DIAGNOSIS — Z114 Encounter for screening for human immunodeficiency virus [HIV]: Secondary | ICD-10-CM

## 2017-09-14 DIAGNOSIS — G4730 Sleep apnea, unspecified: Secondary | ICD-10-CM

## 2017-09-14 DIAGNOSIS — G4710 Hypersomnia, unspecified: Secondary | ICD-10-CM

## 2017-09-14 DIAGNOSIS — F32A Depression, unspecified: Secondary | ICD-10-CM

## 2017-09-14 DIAGNOSIS — E291 Testicular hypofunction: Secondary | ICD-10-CM

## 2017-09-14 DIAGNOSIS — R5383 Other fatigue: Secondary | ICD-10-CM

## 2017-09-14 NOTE — Patient Instructions (Signed)
PLEASE DO NOT DRIVE DUE TO SLEEP PROBLEM. REESTABLISH CARE WITH YOUR SLEEP DOCTOR  Narcolepsy Narcolepsy is a neurological disorder that causes you to fall asleep suddenly, and without control, during the daytime (sleep attacks). Narcolepsy is a lifelong (chronic) disorder. Normally, sleep follows a regular cycle over the course of the night. After about 90 minutes of light sleep, your sleep should become deeper. When your sleep becomes deeper, your body moves less and you start dreaming. This type of deep sleep is called rapid eye movement (REM) sleep. When you have narcolepsy, your REM sleep is not well-regulated. This disrupts your sleep cycle, which causes daytime sleepiness. What are the causes? The cause of narcolepsy is not fully understood, but it may be related to:  Low levels of hypocretin, a chemical (neurotransmitter) in the brain that controls sleep and wake cycles. Hypocretin imbalance may be caused by: ? Abnormal genes that are passed from parent to child (inherited). ? The body's defense system (immune system) attacking hypocretin brain cells (autoimmune disease).  Infection, tumor, or injury in the area of the brain that controls sleep.  Exposure to poisons (toxins), such as heavy metals, pesticides, and secondhand smoke.  What are the signs or symptoms? Symptoms of this condition include:  Excessive daytime sleepiness. This is the most common symptom and is usually the first symptom you will notice. This may affect your performance at work or school.  Sleep attacks. This means falling asleep suddenly and without control. You may fall asleep in the middle of an activity, especially low-energy activities like reading or watching TV.  Feeling like you cannot think clearly.  Trouble focusing or remembering things.  Feeling depressed.  Sudden muscle weakness (cataplexy). When this occurs, your speech may become slurred, or your knees may buckle. Cataplexy is usually  triggered by surprise, anger, fear, or laughter.  Loss of the ability to speak or move (sleep paralysis). This may occur just as you start to fall asleep or wake up. You will be aware of the paralysis. It usually lasts for just a few seconds or minutes.  Seeing, hearing, tasting, smelling, or feeling things that are not real (hallucinations). Hallucinations may occur with sleep paralysis. They can happen when you are falling asleep, waking up, or dozing.  Trouble staying asleep at night (insomnia).  Restless sleep.  How is this diagnosed? This condition may be diagnosed based on:  A physical exam to rule out any other problems that may be causing your symptoms.You may be asked to write down your sleeping patterns for several weeks in a sleep diary. This will help your health care provider make a diagnosis.  Sleep studies that measure how well your REM sleep is regulated. These tests also measure your heart rate, breathing, movement, and brain waves. These tests include: ? An overnight sleep study (polysomnogram). ? A daytime sleep study that is done while you take several naps during the day (multiple sleep latency test, MSLT). This test measures how quickly you fall asleep and how quickly you enter REM sleep.  Removal of spinal fluid to measure hypocretin levels.  How is this treated? There is no cure for this condition, but treatment can help relieve symptoms. Treatment may include:  Lifestyle and sleeping strategies to help you cope with the condition, such as: ? Exercising regularly. ? Maintaining a regular sleep schedule. ? Avoiding caffeine and large meals before bed.  Medicines. These may include: ? Medicines that help keep you awake and alert (stimulants) to fight daytime  sleepiness. ? Medicines that treat depression (antidepressants). These may be used to treat cataplexy. ? Sodium oxybate. This is a strong medicine to help you relax (sedative) that you may take at night. It  can help control daytime sleepiness and cataplexy.  Follow these instructions at home: Sleeping habits  Get about 8 hours of sleep every night.  Go to sleep and get up at about the same time every day.  Keep your bedroom dark, quiet, and comfortable.  When you feel very tired, take short naps. Schedule naps so that you take them at about the same time every day.  Tell your employer or teachers that you have narcolepsy. You may be able to adjust your schedule to include time for naps.  Before bedtime: ? Avoid bright lights and screens. ? Relax. Try activities like reading or taking a warm bath. Activity  Get at least 20 minutes of exercise every day. This will help you sleep better at night and reduce daytime sleepiness.  Avoid exercising within 3 hours of bedtime.  If you are sleepy, do not drive or use heavy machinery.  If possible, take a nap before driving.  Do not swim or go out on the water without a life jacket. Eating and drinking  Do not drink alcohol or caffeinated beverages within 4-5 hours of bedtime.  Do not eat a lot of food before bedtime. Eat meals at about the same times every day. General instructions  Take over-the-counter and prescription medicines only as told by your health care provider.  If directed, keep a sleep diary.  Tell your employer or teachers that you have narcolepsy. You may be able to adjust your schedule to include time for naps.  Do not use any products that contain nicotine or tobacco, such as cigarettes and e-cigarettes. If you need help quitting, ask your health care provider.  Keep all follow-up visits as told by your health care provider. This is important. Contact a health care provider if:  Your symptoms are not getting better.  You have increasingly high blood pressure (hypertension).  You have changes in your heart rhythm.  You are having a hard time determining what is real and what is not (psychosis). Get help right  away if:  You hurt yourself during a sleep attack or an attack of cataplexy.  You have chest pain.  You have trouble breathing. This information is not intended to replace advice given to you by your health care provider. Make sure you discuss any questions you have with your health care provider. Document Released: 08/14/2002 Document Revised: 08/17/2016 Document Reviewed: 08/17/2016 Elsevier Interactive Patient Education  [employer]2018 Elsevier Inc.

## 2017-09-14 NOTE — Progress Notes (Signed)
Subjective:   Fatigue:  [name] is a 52 y.o. male who presents for evaluation of fatigue. Symptoms began worsening over the last few months. The patient feels the fatigue began with: weakness, fatigue,excessive sleepiness, SOB and increased heart with exersion. No chest pain. Symptoms of his fatigue have been anxiousness, feelings of depression, headaches, hypersomnolence and poor athletic performance. Patient describes the following psychological symptoms: depression and No stress in school or work. Patient denies cold intolerance, constipation, fever, significant change in weight, witnessed or suspected sleep apnea and He uses sleep machine at night. Symptoms have gradually worsened. Symptom severity: symptoms bothersome, but easily able to carry out all usual work/school/family activities. Previous visits for this problem: ED for evaluation of anemia which was normal..  Hypersomnia:He was on Ritalin for Narcolepsy but had stopped it for a while now. He had gotten to a stage where he will fall asleep while driving.  Hypogonadism:He stopped taking Androgel months ago due to financial reasons. Skin rash:C/O rash around his neck and arm on and off whenever he wakes in the morning. Depression: He follow-up monthly with [facility] for medication management. He will like to see a Veterinary surgeoncounselor.  The following portions of the patient's history were reviewed and updated as appropriate: allergies, current medications, past family history, past medical history, past social history, past surgical history and problem list.   Current Outpatient Medications on File Prior to Visit  Medication Sig Dispense Refill  . sertraline (ZOLOFT) 50 MG tablet Take 50 mg by mouth daily.     . VENTOLIN HFA 108 (90 BASE) MCG/ACT inhaler INHALE 2 PUFFS EVERY 6 HOURS AS NEEDED FOR WHEEZING OR SHORTNESS OF BREATH 18 g 5   No current facility-administered medications on file prior to visit.    Past Medical History:  Diagnosis  Date  . ADD (attention deficit disorder)   . ADD (attention deficit disorder)   . Anemia, iron deficiency   . Asthma   . Blood transfusion   . Constipation   . Depression   . Depression   . Exertional dyspnea    secondary to anemia  . Hiatal hernia    Vitals:   09/14/17 1127  BP: 130/78  Pulse: 81  Temp: 97.8 F (36.6 C)  TempSrc: Oral  SpO2: 98%  Weight: 273 lb (123.8 kg)  Height: 5\' 11"  (1.803 m)    Review of Systems Pertinent items noted in HPI and remainder of comprehensive ROS otherwise negative.   Review of Systems  Respiratory: Negative for cough and wheezing.   Cardiovascular: Negative for chest pain and palpitations.  Neurological: Negative for dizziness and loss of consciousness.  Psychiatric/Behavioral: Positive for depression. Negative for suicidal ideas.  All other systems reviewed and are negative.     Objective:    BP 130/78   Pulse 81   Temp 97.8 F (36.6 C) (Oral)   Ht 5\' 11"  (1.803 m)   Wt 273 lb (123.8 kg)   SpO2 98%   BMI 38.08 kg/m  General appearance: alert and cooperative Eyes: conjunctivae/corneas clear. PERRL, EOM's intact. Fundi benign., No conjunctival pallor Lungs: clear to auscultation bilaterally Heart: regular rate and rhythm, S1, S2 normal, no murmur, click, rub or gallop Abdomen: soft, non-tender; bowel sounds normal; no masses,  no organomegaly Extremities: extremities normal, atraumatic, no cyanosis or edema Skin: Few scattered papular rash at the back of his neck on the left and right side. Some on his right arm Neurologic: Grossly normal    Assessment:  Fatigue: Likely multifactoria   Hypersomnia Hypogonadism Skin rash Depression Plan:    Check problem list

## 2017-09-14 NOTE — Assessment & Plan Note (Signed)
No acute change. Not at risk for hurting self or others. [facility] but he is unable to [facility]. Continue Zoloft and [facility]. [facility]

## 2017-09-14 NOTE — Assessment & Plan Note (Signed)
I recommended bedding hygiene. He will get pest control to come inspect his house.  Letter given to take to his apartment manager for inspection. F/U as needed.

## 2017-09-14 NOTE — Assessment & Plan Note (Signed)
Some element of Narcolepsy. I recommended avoidance of driving for now for his own safety and that of others. I referred him back to Dr. Maple HudsonYoung for sleep problem management. F/U as needed.

## 2017-09-14 NOTE — Assessment & Plan Note (Signed)
Likely multifactorial: Narcolepsy, hypogonadism, depression. Anemia unlikely given recently normal CBC but he is requesting for recheck. CBC rechecked. TSH, Testosterone, HIV, cortisone level, Cmet checked today. I will contact him with result. I recommended graded exercise. Continue use of CPAP machine.

## 2017-09-14 NOTE — Assessment & Plan Note (Signed)
Testosterone checked today.

## 2017-09-15 ENCOUNTER — Encounter: Payer: Self-pay | Admitting: Family Medicine

## 2017-09-15 NOTE — Telephone Encounter (Signed)
-----   Message from Doreene ElandKehinde T Eniola, MD sent at 09/15/2017  8:47 AM EST ----- Please call to inform patient:  Hemoglobin is again normal as well as his thyroid, kidney and liver test. Testosterone level is low; this might be contributing to his fatigue. We can have him restart Androgel. I will send it to the pharmacy of his choice.

## 2017-09-15 NOTE — Telephone Encounter (Signed)
Mychart message was sent to patient. [name],CMA

## 2017-09-16 LAB — CMP14+EGFR
ALK PHOS: 87 IU/L (ref 39–117)
ALT: 12 IU/L (ref 0–44)
AST: 16 IU/L (ref 0–40)
Albumin/Globulin Ratio: 1.6 (ref 1.2–2.2)
Albumin: 4.3 g/dL (ref 3.5–5.5)
BUN/Creatinine Ratio: 13 (ref 9–20)
BUN: 14 mg/dL (ref 6–24)
Bilirubin Total: <0.2 mg/dL (ref 0.0–1.2)
CO2: 22 mmol/L (ref 20–29)
CREATININE: 1.05 mg/dL (ref 0.76–1.27)
Calcium: 9.3 mg/dL (ref 8.7–10.2)
Chloride: 104 mmol/L (ref 96–106)
GFR calc Af Amer: 94 mL/min/{1.73_m2} (ref >59)
GFR, EST NON AFRICAN AMERICAN: 81 mL/min/{1.73_m2} (ref >59)
GLOBULIN, TOTAL: 2.7 g/dL (ref 1.5–4.5)
GLUCOSE: 99 mg/dL (ref 65–99)
Potassium: 4.5 mmol/L (ref 3.5–5.2)
SODIUM: 139 mmol/L (ref 134–144)
Total Protein: 7 g/dL (ref 6.0–8.5)

## 2017-09-16 LAB — CBC WITH DIFFERENTIAL/PLATELET
BASOS: 1 %
Basophils Absolute: 0.1 10*3/uL (ref 0.0–0.2)
EOS (ABSOLUTE): 0.2 10*3/uL (ref 0.0–0.4)
EOS: 3 %
HEMATOCRIT: 41 % (ref 37.5–51.0)
Hemoglobin: 13 g/dL (ref 13.0–17.7)
IMMATURE GRANULOCYTES: 0 %
Immature Grans (Abs): 0 10*3/uL (ref 0.0–0.1)
LYMPHS: 15 %
Lymphocytes Absolute: 1.1 10*3/uL (ref 0.7–3.1)
MCH: 25.6 pg — ABNORMAL LOW (ref 26.6–33.0)
MCHC: 31.7 g/dL (ref 31.5–35.7)
MCV: 81 fL (ref 79–97)
Monocytes Absolute: 0.8 10*3/uL (ref 0.1–0.9)
Monocytes: 10 %
NEUTROS PCT: 71 %
Neutrophils Absolute: 5.3 10*3/uL (ref 1.4–7.0)
PLATELETS: 302 10*3/uL (ref 150–379)
RBC: 5.07 x10E6/uL (ref 4.14–5.80)
RDW: 14 % (ref 12.3–15.4)
WBC: 7.5 10*3/uL (ref 3.4–10.8)

## 2017-09-16 LAB — CORTISOL-AM, BLOOD: CORTISOL - AM: 8.2 ug/dL (ref 6.2–19.4)

## 2017-09-16 LAB — TESTOSTERONE, FREE, TOTAL, SHBG
SEX HORMONE BINDING: 26.5 nmol/L (ref 19.3–76.4)
TESTOSTERONE FREE: 4.4 pg/mL — AB (ref 7.2–24.0)
TESTOSTERONE: 157 ng/dL — AB (ref 264–916)

## 2017-09-16 LAB — HIV ANTIBODY (ROUTINE TESTING W REFLEX): HIV Screen 4th Generation wRfx: NONREACTIVE

## 2017-09-16 LAB — ANA: ANA: NEGATIVE

## 2017-09-16 LAB — TSH: TSH: 1.79 u[IU]/mL (ref 0.450–4.500)

## 2017-09-20 ENCOUNTER — Telehealth: Payer: Self-pay | Admitting: Family Medicine

## 2017-09-20 NOTE — Telephone Encounter (Signed)
Message left to call me back.   Note: testosterone level still low. May be cause of his fatigue. Consider restarting testosterone replacement. Will f/u when he calls back.

## 2017-10-03 ENCOUNTER — Encounter: Payer: Self-pay | Admitting: Family Medicine

## 2017-10-18 ENCOUNTER — Telehealth: Payer: Self-pay | Admitting: Internal Medicine

## 2017-10-18 NOTE — Telephone Encounter (Signed)
Called patient, unable to reach LMTCB 

## 2017-10-18 NOTE — Telephone Encounter (Signed)
At last contact we had suggested he see another of our sleep physicians for a second opinion- One of them may be easier to schedule.

## 2017-10-18 NOTE — Telephone Encounter (Signed)
CY please advise if pt can be worked in before next available appt in April.  Thanks!

## 2017-10-20 NOTE — Telephone Encounter (Signed)
ATC pt, no answer. Left message for pt to call back.  

## 2017-10-21 NOTE — Telephone Encounter (Signed)
Called pt and advised message from the provider. Pt understood and verbalized understanding. Nothing further is needed.    

## 2017-10-21 NOTE — Telephone Encounter (Signed)
ATC pt x2-call was disconnected.  Will call back

## 2017-11-13 ENCOUNTER — Encounter: Payer: Self-pay | Admitting: Internal Medicine

## 2017-11-13 ENCOUNTER — Encounter: Payer: Self-pay | Admitting: Family Medicine

## 2017-11-15 NOTE — Telephone Encounter (Signed)
Ok NP. Plan was to establish him with a different sleep doc for another opinion- not back with me.

## 2017-11-15 NOTE — Telephone Encounter (Signed)
CY please advise on this. Would you like for me to schedule them an appointment with you or NP.  Thanks.

## 2017-11-16 ENCOUNTER — Encounter: Payer: Self-pay | Admitting: Internal Medicine

## 2017-11-23 ENCOUNTER — Ambulatory Visit (INDEPENDENT_AMBULATORY_CARE_PROVIDER_SITE_OTHER): Payer: Self-pay | Admitting: Adult Health

## 2017-11-23 ENCOUNTER — Encounter: Payer: Self-pay | Admitting: Adult Health

## 2017-11-23 VITALS — BP 136/84 | HR 91 | Ht 71.0 in | Wt 277.2 lb

## 2017-11-23 DIAGNOSIS — G4733 Obstructive sleep apnea (adult) (pediatric): Secondary | ICD-10-CM

## 2017-11-23 DIAGNOSIS — G4710 Hypersomnia, unspecified: Secondary | ICD-10-CM

## 2017-11-23 DIAGNOSIS — G4730 Sleep apnea, unspecified: Secondary | ICD-10-CM

## 2017-11-23 NOTE — Patient Instructions (Signed)
Continue on CPAP At bedtime  .  CPAP download .  Work on healthy weight . Do not drive if sleepy .  Set up for a sleep study and MSLT study .  Follow up with Dr. Vassie LollAlva  In 6-8 weeks and As needed

## 2017-11-23 NOTE — Assessment & Plan Note (Signed)
Cont on CPAP At bedtime   Download requested.  

## 2017-11-23 NOTE — Assessment & Plan Note (Signed)
Excessive daytime sleepiness despite reported CPAP compliance  CPAP download requested.  Set up for Sleep study followed by MSLT   Plan  Patient Instructions  Continue on CPAP At bedtime  .  CPAP download .  Work on healthy weight . Do not drive if sleepy .  Set up for a sleep study and MSLT study .  Follow up with Dr. Vassie LollAlva  In 6-8 weeks and As needed

## 2017-11-23 NOTE — Progress Notes (Signed)
 @Patient  ID: [name], male    DOB: 10-Jan-1966, 52 y.o.   MRN: [id number]  Chief Complaint  Patient presents with  . Follow-up    OSA     Referring provider: Doreene ElandEniola, Kehinde T, MD  HPI: 52 year old male never smoker followed for moderate sleep apnea on CPAP with persistent daytime hypersomnia Past medical history significant for depression  TEST  NPSG 04/30/14- positive for moderate obstructive sleep apnea, AHI 16.9 per hour with moderate snoring and desaturation to 86%. CPAP titrated to 14 CWP   11/23/2017 Follow up : OSA  Patient returns for a follow-up for sleep apnea and hypersomnia.  Patient was last seen in the office March 2017. Patient has moderate sleep apnea on CPAP.  Patient says he wears his machine each night.  Unfortunately he did not bring his machine or SD cart in for a download.  A download from DME has been requested Patient has had ongoing difficulty for persistent daytime hypersomnia despite wearing CPAP machine each night.  Previously on Ritalin and Nuvigil , Concerta  in the past.  Unfortunately controlled substance prescriptions from our office were discontinued from patient due to pharmacy reporting multiple prescriptions of similar medications.  Last office notes from Dr. Maple HudsonYoung indicate that patient is only to get any type of controlled substance or stimulants from behavioral health care provider.Says not  Says he remains sleepy all the time , wants to sleep all the time . Lost job due to sleep issues , was late to work due to unable to wake up  Has seen by PCP with reported neg lab workup .  Says caffeine does not help .  Had episode years ago of laughing followed by excessive sleep.      No Known Allergies  There is no immunization history for the selected administration types on file for this patient.  Past Medical History:  Diagnosis Date  . ADD (attention deficit disorder)   . ADD (attention deficit disorder)   . Anemia, iron deficiency     . Asthma   . Blood transfusion   . Constipation   . Depression   . Depression   . Exertional dyspnea    secondary to anemia  . Hiatal hernia     Tobacco History: Social History   Tobacco Use  Smoking Status Never Smoker  Smokeless Tobacco Never Used   Counseling given: Not Answered   Outpatient Encounter Medications as of 11/23/2017  Medication Sig  . sertraline (ZOLOFT) 50 MG tablet Take 50 mg by mouth daily.   . VENTOLIN HFA 108 (90 BASE) MCG/ACT inhaler INHALE 2 PUFFS EVERY 6 HOURS AS NEEDED FOR WHEEZING OR SHORTNESS OF BREATH   No facility-administered encounter medications on file as of 11/23/2017.      Review of Systems  Constitutional:   No  weight loss, night sweats,  Fevers, chills,  +fatigue, or  lassitude.  HEENT:   No headaches,  Difficulty swallowing,  Tooth/dental problems, or  Sore throat,                No sneezing, itching, ear ache, nasal congestion, post nasal drip,   CV:  No chest pain,  Orthopnea, PND, swelling in lower extremities, anasarca, dizziness, palpitations, syncope.   GI  No heartburn, indigestion, abdominal pain, nausea, vomiting, diarrhea, change in bowel habits, loss of appetite, bloody stools.   Resp: No shortness of breath with exertion or at rest.  No excess mucus, no productive cough,  No non-productive  cough,  No coughing up of blood.  No change in color of mucus.  No wheezing.  No chest wall deformity  Skin: no rash or lesions.  GU: no dysuria, change in color of urine, no urgency or frequency.  No flank pain, no hematuria   MS:  No joint pain or swelling.  No decreased range of motion.  No back pain.    Physical Exam  BP 136/84 (BP Location: Left Arm, Cuff Size: Large)   Pulse 91   Ht 5\' 11"  (1.803 m)   Wt 277 lb 3.2 oz (125.7 kg)   SpO2 97%   BMI 38.66 kg/m   GEN: A/Ox3; pleasant , NAD, obese    HEENT:  [place]/AT,  EACs-clear, TMs-wnl, NOSE-clear, THROAT-clear, no lesions, no postnasal drip or exudate noted. Class  2-3 MP airway   NECK:  Supple w/ fair ROM; no JVD; normal carotid impulses w/o bruits; no thyromegaly or nodules palpated; no lymphadenopathy.    RESP  Clear  P & A; w/o, wheezes/ rales/ or rhonchi. no accessory muscle use, no dullness to percussion  CARD:  RRR, no m/r/g, no peripheral edema, pulses intact, no cyanosis or clubbing.  GI:   Soft & nt; nml bowel sounds; no organomegaly or masses detected.   Musco: Warm bil, no deformities or joint swelling noted.   Neuro: alert, no focal deficits noted.    Skin: Warm, no lesions or rashes    Lab Results:  CBC    Component Value Date/Time   WBC 7.5 09/14/2017 1157   WBC 7.6 09/07/2017 1248   RBC 5.07 09/14/2017 1157   RBC 5.10 09/07/2017 1248   HGB 13.0 09/14/2017 1157   HCT 41.0 09/14/2017 1157   PLT 302 09/14/2017 1157   MCV 81 09/14/2017 1157   MCH 25.6 (L) 09/14/2017 1157   MCH 26.1 09/07/2017 1248   MCHC 31.7 09/14/2017 1157   MCHC 32.2 09/07/2017 1248   RDW 14.0 09/14/2017 1157   LYMPHSABS 1.1 09/14/2017 1157   MONOABS 702 01/07/2016 1509   EOSABS 0.2 09/14/2017 1157   BASOSABS 0.1 09/14/2017 1157    BMET    Component Value Date/Time   NA 139 09/14/2017 1157   K 4.5 09/14/2017 1157   CL 104 09/14/2017 1157   CO2 22 09/14/2017 1157   GLUCOSE 99 09/14/2017 1157   GLUCOSE 92 09/07/2017 1248   BUN 14 09/14/2017 1157   CREATININE 1.05 09/14/2017 1157   CREATININE 1.26 05/29/2014 0847   CALCIUM 9.3 09/14/2017 1157   GFRNONAA 81 09/14/2017 1157   GFRNONAA 67 05/29/2014 0847   GFRAA 94 09/14/2017 1157   GFRAA 77 05/29/2014 0847    BNP No results found for: BNP  ProBNP No results found for: PROBNP  Imaging: No results found.   Assessment & Plan:   OSA (obstructive sleep apnea) Cont on CPAP At bedtime   Download requested.   Hypersomnia with sleep apnea Excessive daytime sleepiness despite reported CPAP compliance  CPAP download requested.  Set up for Sleep study followed by MSLT   Plan   Patient Instructions  Continue on CPAP At bedtime  .  CPAP download .  Work on healthy weight . Do not drive if sleepy .  Set up for a sleep study and MSLT study .  Follow up with Dr. Vassie Loll  In 6-8 weeks and As needed          Rubye Oaks, NP 11/23/2017

## 2017-12-09 ENCOUNTER — Encounter: Payer: Self-pay | Admitting: Family Medicine

## 2017-12-13 NOTE — Progress Notes (Signed)
Subjective:   Patient ID: PACER DORN    DOB: Mar 02, 1966, 52 y.o. male   MRN: [id number]  SHELIA MAGALLON is a 52 y.o. male with a history of asthma, OSA, GERD, depression, iron deficiency anemia, ADHD here for   FATIGUE - Seen in 09/2017 for similar issue - thought to be multifactorial, d/t narcolepsy/OSA on CPAP, hypogonadism, depression and recommended graded exercise. CMP, CBC wnl at that time. Low testosterone 157, free testosterone 4.4. TSH wnl. ANA negative, HIV NR. Previously on testosterone replacement.  - Currently doesn't have a place to live, not currently on CPAP. - States fatigue "cycles," waxes and wanes. - Previously seen by [facility] hematology, stated he previously received infusions for anemia. Denies previous injections for testosterone. - Endorsing SOB, racing heart, weakness, severe daytime sleepiness, feeling down and not enjoying things - Denies fevers, sweating at night, hemoptysis, black or bloody stools, rashes, joint swelling, chest pain, irregular heart beat - Recently terminated for absenteeism due to sleeping. Currently living out of his car. On CPAP but hasn't been using the last month. Out of meds, including SSRI for a few months. - No HI. Some SI, stating sometimes it would be easier if it all ended but denies plan. - On waiting list at housing/shelters. - Was zoloft for years, 15m daily, wasn't noticing much effect so self-discontinued. On concerta, stopped 2-3 months from [facility], stopped going to [employer] "was a hassle." Previously on celexa and prozac. Previously on Methylphenidate for ADD. Was on pristiq for a while. States most meds just made him sleepy.  - Scheduled for day and nightime sleep study on 4/26. - thinking about filing for unemployment  Review of Systems:  Per HPI.   PTrevorton reviewed. Smoking status reviewed. Medications reviewed.  Objective:   BP 114/70   Pulse 94   Wt 278 lb (126.1 kg)   SpO2 95%   BMI 38.77 kg/m  Vitals  and nursing note reviewed.  General: well nourished, well developed, in no acute distress with non-toxic appearance HEENT: normocephalic, atraumatic, moist mucous membranes Neck: supple, non-tender without lymphadenopathy CV: regular rate and rhythm without murmurs, rubs, or gallops Lungs: clear to auscultation bilaterally with normal work of breathing Abdomen: soft, non-tender, non-distended, no masses or organomegaly palpable, normoactive bowel sounds Skin: warm, dry, no rashes or lesions Extremities: warm and well perfused, normal tone MSK: ROM grossly intact, strength intact, gait normal Neuro: Alert and oriented, speech normal  Depression screen PHQ 2/9 12/14/2017  Decreased Interest 3  Down, Depressed, Hopeless 3  PHQ - 2 Score 6  Altered sleeping 3  Tired, decreased energy 3  Change in appetite 3  Feeling bad or failure about yourself  3  Trouble concentrating 3  Moving slowly or fidgety/restless 3  Suicidal thoughts 1  PHQ-9 Score 25  Difficult doing work/chores -    Assessment & Plan:   Fatigue Multifactorial as noted previously with leading causes of depression and OSA. Previously evaluated with normal CBC, CMP so unlikely neoplastic etiology. Slightly low testosterone and may need supplementation. Has sleep study scheduled which he was encouraged to keep appointment. LCSW met with patient just prior to appointment and discussed social/community resources. Will also obtain repeat testosterone level to determine contribution to fatigue. Believe his most pressing issue is depression with PHQ9 25 today and currently off home antidepressants. Encourage him to follow up with Integrated Care. Will trial SSRI with adjunct Wellbutrin.  Orders Placed This Encounter  Procedures  . Testosterone  Standing Status:   Future    Standing Expiration Date:   12/15/2018  . Testosterone Free with SHBG    Standing Status:   Future    Standing Expiration Date:   12/15/2018   Meds ordered  this encounter  Medications  . buPROPion (WELLBUTRIN XL) 150 MG 24 hr tablet    Sig: Take 1 tablet (150 mg total) by mouth daily.    Dispense:  30 tablet    Refill:  2    Please use indigent fund - call with questions  . sertraline (ZOLOFT) 50 MG tablet    Sig: Take 1 tablet (50 mg total) by mouth daily.    Dispense:  30 tablet    Refill:  3    Please use indigent fund  . carbamide peroxide (DEBROX) 6.5 % OTIC solution    Sig: Place 5 drops into the right ear 2 (two) times daily.    Dispense:  15 mL    Refill:  0    Please use indigent fund    Rory Percy, DO PGY-1, [facility] 12/14/2017 5:31 PM

## 2017-12-14 ENCOUNTER — Encounter: Payer: Self-pay | Admitting: Family Medicine

## 2017-12-14 ENCOUNTER — Encounter: Payer: Self-pay | Admitting: Licensed Clinical Social Worker

## 2017-12-14 ENCOUNTER — Ambulatory Visit (INDEPENDENT_AMBULATORY_CARE_PROVIDER_SITE_OTHER): Payer: Self-pay | Admitting: Family Medicine

## 2017-12-14 ENCOUNTER — Telehealth: Payer: Self-pay | Admitting: *Deleted

## 2017-12-14 ENCOUNTER — Other Ambulatory Visit: Payer: Self-pay

## 2017-12-14 VITALS — BP 114/70 | HR 94 | Wt 278.0 lb

## 2017-12-14 DIAGNOSIS — R5383 Other fatigue: Secondary | ICD-10-CM

## 2017-12-14 MED ORDER — BUPROPION HCL ER (XL) 150 MG PO TB24: 150.0000 mg | ORAL_TABLET | Freq: Every day | ORAL | 2 refills | Status: DC

## 2017-12-14 MED ORDER — SERTRALINE HCL 50 MG PO TABS: 50.0000 mg | ORAL_TABLET | Freq: Every day | ORAL | 3 refills | Status: DC

## 2017-12-14 MED ORDER — CARBAMIDE PEROXIDE 6.5 % OT SOLN: 5.0000 [drp] | Freq: Two times a day (BID) | OTIC | 0 refills | Status: DC

## 2017-12-14 MED FILL — SERTRALINE HCL 50 MG TABLET: 50 | 30 days supply | Qty: 30 | Fill #0

## 2017-12-14 MED FILL — buPROPion HCL ER (XL) 150 M: 150 | 30 days supply | Qty: 30 | Fill #0

## 2017-12-14 NOTE — Telephone Encounter (Signed)
[facility] V[facility]:  Previous assistance?  no  Taxi, Bus pass, gas card, food, or other? (2) $5 gift cards Provider:  Dr. [facility] Staff:  Jo-Anne Kluth, Maryjo RochesterJessica Dawn, CMA Note routed to Altamese DillingJeannette Richardson, RN and Sammuel Hineseborah Moore, LCSW to add to [employer] spreadsheet.

## 2017-12-14 NOTE — Patient Instructions (Addendum)
It was great to see you!  For your fatigue,  - We are getting some labs. Please schedule a lab appointment to recheck your testosterone in the morning. - I am prescribing Wellbutrin and Zoloft [facility]. - Please keep your appointment for your sleep study. - I am sending a message to our Integrated Care team to schedule an appointment for you for Behavioral Health. Someone should be calling you to schedule an appointment. - Follow up in one month with your primary doctor. - I am hopeful you will find housing soon! Let us know how we can assist you!  I am also prescribing some ear drops to help with your wax production.  Take care and seek immediate care sooner if you develop any concerns.   Dr. Mollie Germanyumball [facility]

## 2017-12-14 NOTE — Progress Notes (Signed)
ESTIMATE TIME:20 minutes Type of Service: Integrated Behavioral Health warm handoff  Interpreter:No.   [name] is a 52 y.o. male referred by Dr. Linwood Dibblesumball for symptoms of  depression a well as  psychosocial stressors.  Patient is pleasant and engaged in [place]. Reports depression, fatigue and sleep disturbance recently loss job and housing. Currently homeless. Duration of problem: 2 months Impact: unable to use CPAP, machine; unable to get medication, has no perm. housing Current /Hx of mental health treatment & substance use: hx of depression, inpatient mental health treatment about 5 or 6 years ago. Appearance:Well Groomed ; Thought process: Coherent; Affect: Depressed : Suicidal ideation in the past, with no plan.  No thoughts today. LIFE /SOCIAL :  patient lives alone ,prior to eviction from his apartment, no family or support system. 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  Has applied for food benefits at DSS Recent Life changes: lost job; off of medications; often sleeps in his car; gets some help from [employer]. GOALS: Patient will reduce symptoms of: depression and stress, and increase  ability ZO:XWRUEAof:coping skills and self-management skills, Increase healthy adjustment to current life circumstances. INTERVENTION:  Supportive Counseling, Reflective listening, Investment banker, corporateCommunity Resource ; Gas card provided ; assisted with medication from [employer] [facility].  ISSUES DISCUSSED: Integrated care services, support system, previous and current coping skills, community resources ( housing, shelters, food banks, medication assistance, mental health support, Behavior Health hospital , Day [facility] and [facility] as well as unemployment benefits).    ASSESSMENT:Patient currently experiencing symptoms of depression.  Symptoms exacerbated by chronic medical conditions and psychosocial stressors related to being homeless. Patient may benefit from,further assessment and therapeutic interventions  to assist with managing his stressors.  Patient has been very resourceful and connected with all resources in the community.  Currently on wait list for shelters.  PLAN:  [facility]  2.Referral:Community Mental Health Services (LME/Outside Clinic), however patient not interested in going at this time  Warm Hand Off Completed.     Jeffery Hineseborah Tsugio Elison, LCSW Licensed Clinical Social Worker [facility]   [phone] 8:42 AM

## 2017-12-14 NOTE — Assessment & Plan Note (Addendum)
Multifactorial as noted previously with leading causes of depression and OSA. Previously evaluated with normal CBC, CMP so unlikely neoplastic etiology. Slightly low testosterone and may need supplementation. Has sleep study scheduled which he was encouraged to keep appointment. LCSW met with patient just prior to appointment and discussed social/community resources. Will also obtain repeat testosterone level to determine contribution to fatigue. Believe his most pressing issue is depression with PHQ9 25 today and currently off home antidepressants. Encourage him to follow up with Integrated Care. Will trial SSRI with adjunct Wellbutrin.

## 2017-12-15 ENCOUNTER — Other Ambulatory Visit: Payer: Self-pay

## 2017-12-15 ENCOUNTER — Ambulatory Visit: Payer: Self-pay | Admitting: Internal Medicine

## 2017-12-16 ENCOUNTER — Encounter: Payer: Self-pay | Admitting: Family Medicine

## 2017-12-16 ENCOUNTER — Other Ambulatory Visit: Payer: Self-pay

## 2017-12-16 DIAGNOSIS — R5383 Other fatigue: Secondary | ICD-10-CM

## 2017-12-21 ENCOUNTER — Telehealth: Payer: Self-pay | Admitting: Psychology

## 2017-12-21 NOTE — Telephone Encounter (Signed)
Patient walked in requesting an appointment to be scheduled with IC.  Reviewed notes and spoke with Gavin PoundDeborah who has seen him once before.  Scheduled him to see Gavin PoundDeborah Thursday at 10:00.

## 2017-12-22 ENCOUNTER — Ambulatory Visit: Payer: Self-pay

## 2017-12-22 ENCOUNTER — Encounter: Payer: Self-pay | Admitting: Family Medicine

## 2017-12-23 ENCOUNTER — Ambulatory Visit (INDEPENDENT_AMBULATORY_CARE_PROVIDER_SITE_OTHER): Payer: Self-pay | Admitting: Licensed Clinical Social Worker

## 2017-12-23 DIAGNOSIS — F32A Depression, unspecified: Secondary | ICD-10-CM

## 2017-12-23 DIAGNOSIS — F329 Major depressive disorder, single episode, unspecified: Secondary | ICD-10-CM

## 2017-12-23 NOTE — Progress Notes (Signed)
Type of Service: Integrated Behavioral Health 2 F/U Visit Total time:35 minutes :  Interpreter:No.    Reason for follow-up: Continue brief intervention to assist patient with managing symptoms of depression, as well as psychosocial stressors . Patient is pleasant and engaged in conversation.   Appearance:Well Groomed ; Thought process: Coherent; Affect: Appropriate  Patient states he feels better this week. Thinks the medication is working for him.  He has been able to complete several task that will help with improving his situation.   Goals: Patient will reduce symptoms of: depression and stress , and increase  ability JX:BJYNWGof:coping skills and self-management skills, Increase healthy adjustment to current life circumstances.  Intervention: Solution-Focused Strategies and Supportive Counseling, Reflective listening, ), Museum/gallery curatorCommunity Resource and Problem-solving teaching/coping strategies : Provided patient with food box and 2 more $5.00 gas cards so that he could go to the Food class. Issues discussed: top stressors: food box program  ; reviewed list of things he has accomplished ;  Concerns for safety while sleeping in car ; applied for food benefits ;  Assessment:Patient continues to experience symptoms of depression.  Symptoms exacerbated by homelessness and no income.  Patient may benefit from, and is in agreement to continue further assessment and brief therapeutic interventions to assist with managing stressors.  Plan: 1. Patient will F/U with LCSW in 2 weeks 2.. Referral: Community Resources:  Food, Box program; [place] works and [employer] job program  Sammuel Hineseborah Moore, LCSW Licensed Clinical Social Worker [facility]   [phone] 12:09 PM

## 2017-12-24 LAB — TESTOSTERONE, FREE AND TOTAL (INCLUDES SHBG)-(MALES)
% Free Testosterone: 2.8 %
Free Testosterone, S: 37 pg/mL — ABNORMAL LOW
Sex Hormone Binding Globulin: 19.7 nmol/L
Testosterone, Serum (Total): 133 ng/dL — ABNORMAL LOW

## 2017-12-24 LAB — TESTOSTERONE: TESTOSTERONE: 150 ng/dL — AB (ref 264–916)

## 2017-12-24 MED ORDER — ALBUTEROL SULFATE HFA 108 (90 BASE) MCG/ACT IN AERS: INHALATION_SPRAY | RESPIRATORY_TRACT | 0 refills | Status: DC

## 2017-12-24 MED FILL — VENTOLIN HFA 90 MCG INHALER: 108 (90 BAS | 25 days supply | Qty: 18 | Fill #0

## 2017-12-27 ENCOUNTER — Ambulatory Visit: Payer: Self-pay

## 2017-12-27 ENCOUNTER — Encounter: Payer: Self-pay | Admitting: Family Medicine

## 2017-12-28 ENCOUNTER — Encounter: Payer: Self-pay | Admitting: Adult Health

## 2017-12-29 ENCOUNTER — Encounter: Payer: Self-pay | Admitting: Family Medicine

## 2018-01-02 ENCOUNTER — Ambulatory Visit (HOSPITAL_BASED_OUTPATIENT_CLINIC_OR_DEPARTMENT_OTHER): Payer: Self-pay | Attending: Adult Health | Admitting: Pulmonary Disease

## 2018-01-02 VITALS — Ht 71.0 in | Wt 265.0 lb

## 2018-01-02 DIAGNOSIS — G4733 Obstructive sleep apnea (adult) (pediatric): Secondary | ICD-10-CM | POA: Insufficient documentation

## 2018-01-02 DIAGNOSIS — Z9989 Dependence on other enabling machines and devices: Secondary | ICD-10-CM

## 2018-01-03 ENCOUNTER — Encounter: Payer: Self-pay | Admitting: Adult Health

## 2018-01-03 ENCOUNTER — Encounter (HOSPITAL_BASED_OUTPATIENT_CLINIC_OR_DEPARTMENT_OTHER): Payer: Self-pay

## 2018-01-04 ENCOUNTER — Ambulatory Visit: Payer: Self-pay | Admitting: Family Medicine

## 2018-01-05 DIAGNOSIS — G4730 Sleep apnea, unspecified: Secondary | ICD-10-CM

## 2018-01-05 HISTORY — DX: Sleep apnea, unspecified: G47.30

## 2018-01-05 NOTE — Telephone Encounter (Signed)
Dr. Maple Hudson please advise on this email thread- patient was scheduled for a sleep test but it was cancelled? Thanks.

## 2018-01-06 ENCOUNTER — Ambulatory Visit: Payer: Self-pay

## 2018-01-07 DIAGNOSIS — G4733 Obstructive sleep apnea (adult) (pediatric): Secondary | ICD-10-CM

## 2018-01-07 NOTE — Procedures (Signed)
Patient Name: [name], [name] Date: 01/02/2018 Gender: Male D.O.B: 07-15-1966 Age (years): 51 Referring Provider: Tammy Parrett Height (inches): 71 Interpreting Physician: Cyril Mourning MD, ABSM Weight (lbs): 265 RPSGT: Cherylann Parr BMI: 37 MRN: [id number] Neck Size: 19.00 <br> <br> CLINICAL INFORMATION Sleep Study Type: NPSG  Indication for sleep study: Daytime Fatigue, Fatigue, Obesity, Snoring, Witnesses Apnea / Gasping During Sleep    Epworth Sleepiness Score: 15    SLEEP STUDY TECHNIQUE As per the AASM Manual for the Scoring of Sleep and Associated Events v2.3 (April 2016) with a hypopnea requiring 4% desaturations.  The channels recorded and monitored were frontal, central and occipital EEG, electrooculogram (EOG), submentalis EMG (chin), nasal and oral airflow, thoracic and abdominal wall motion, anterior tibialis EMG, snore microphone, electrocardiogram, and pulse oximetry.  MEDICATIONS Medications self-administered by patient taken the night of the study : ZOLOFT, WELBUTERIN  SLEEP ARCHITECTURE The study was initiated at 10:05:37 PM and ended at 5:30:51 AM.  Sleep onset time was 3.5 minutes and the sleep efficiency was 92.3%%. The total sleep time was 411 minutes.  Stage REM latency was 257.5 minutes.  The patient spent 10.7%% of the night in stage N1 sleep, 82.8%% in stage N2 sleep, 0.0%% in stage N3 and 6.45% in REM.  Alpha intrusion was absent.  Supine sleep was 49.68%.  RESPIRATORY PARAMETERS The overall apnea/hypopnea index (AHI) was 57.1 per hour. There were 188 total apneas, including 187 obstructive, 1 central and 0 mixed apneas. There were 203 hypopneas and 6 RERAs.  The AHI during Stage REM sleep was 43.0 per hour.  AHI while supine was 73.7 per hour.  The mean oxygen saturation was 93.2%. The minimum SpO2 during sleep was 77.0%.  loud snoring was noted during this study.  CARDIAC DATA The 2 lead EKG demonstrated sinus rhythm. The mean  heart rate was 74.7 beats per minute. Other EKG findings include: None.   LEG MOVEMENT DATA The total PLMS were 0 with a resulting PLMS index of 0.0. Associated arousal with leg movement index was 0.1 .  IMPRESSIONS - Severe obstructive sleep apnea occurred during this study (AHI = 57.1/h). - No significant central sleep apnea occurred during this study (CAI = 0.1/h). - Moderate oxygen desaturation was noted during this study (Min O2 = 77.0%). - The patient snored with loud snoring volume. - No cardiac abnormalities were noted during this study. - Clinically significant periodic limb movements did not occur during sleep. No significant associated arousals.   DIAGNOSIS - Obstructive Sleep Apnea (327.23 [G47.33 ICD-10])   RECOMMENDATIONS - Therapeutic CPAP titration to determine optimal pressure required to alleviate sleep disordered breathing. - MSLT would be indicated if he remains sleep inspite of adequate CPAP use.  - Avoid alcohol, sedatives and other CNS depressants that may worsen sleep apnea and disrupt normal sleep architecture. - Sleep hygiene should be reviewed to assess factors that may improve sleep quality. - Weight management and regular exercise should be initiated or continued if appropriate.   Cyril Mourning MD Board Certified in Sleep medicine

## 2018-01-07 NOTE — Telephone Encounter (Signed)
LMOM TCB x1 to discuss this in person with patient  The MSLT was not needed because the sleep study showed severe apnea (AHI >50) so this likely explains where the daytime sleepiness if from (as discussed with Dr Maple Hudson).  If patient is STILL having breakthrough daytime sleepiness DESPITE being compliant with CPAP, then a CPAP titration followed by MSLT can be ordered but this can be addressed by Dr Vassie Loll at the 5.9.19 office visit.  However we MUST have a copy of his download so he ABSOLUTELY needs to bring his SD to that next office visit.

## 2018-01-07 NOTE — Telephone Encounter (Signed)
This patient is new to me-sees Dr. Maple Hudson but is apparently transferring to me. M SLT and overnight PSG was ordered by TP after last office was it. NPSG Showed severe OSA with AHI of 50/hour , therefore M SLT was appropriately cancelled  Will reassess need for M SLT in the future after CPAP compliance is confirmed

## 2018-01-07 NOTE — Telephone Encounter (Signed)
Received an email from pt stating it is showing he was a no show for the sleep study that was originally scheduled for 4/30.  The test was the MSLT. DR. Vassie Loll, was this test supposed to have been cancelled for pt as it was made a no show when you look at pt's past appointments.  Please advise, thanks!

## 2018-01-07 NOTE — Telephone Encounter (Signed)
This study was canceled because it was not necessary Not a no-show

## 2018-01-11 ENCOUNTER — Ambulatory Visit (INDEPENDENT_AMBULATORY_CARE_PROVIDER_SITE_OTHER): Payer: Self-pay | Admitting: Family Medicine

## 2018-01-11 ENCOUNTER — Other Ambulatory Visit: Payer: Self-pay

## 2018-01-11 VITALS — BP 130/90 | HR 96 | Temp 97.7°F | Ht 71.0 in

## 2018-01-11 DIAGNOSIS — R519 Headache, unspecified: Secondary | ICD-10-CM

## 2018-01-11 DIAGNOSIS — R51 Headache: Secondary | ICD-10-CM

## 2018-01-11 DIAGNOSIS — J4521 Mild intermittent asthma with (acute) exacerbation: Secondary | ICD-10-CM

## 2018-01-11 MED ORDER — ALBUTEROL SULFATE (2.5 MG/3ML) 0.083% IN NEBU
2.5000 mg | INHALATION_SOLUTION | Freq: Once | RESPIRATORY_TRACT | Status: AC
  Administered 2018-01-11: 2.5 mg via RESPIRATORY_TRACT

## 2018-01-11 MED ORDER — IPRATROPIUM BROMIDE 0.02 % IN SOLN
0.5000 mg | Freq: Once | RESPIRATORY_TRACT | Status: AC
  Administered 2018-01-11: 0.5 mg via RESPIRATORY_TRACT

## 2018-01-11 MED ORDER — PREDNISONE 20 MG PO TABS: 20.0000 mg | ORAL_TABLET | Freq: Every day | ORAL | 0 refills | Status: DC

## 2018-01-11 MED ORDER — METHYLPREDNISOLONE ACETATE 40 MG/ML IJ SUSP
40.0000 mg | Freq: Once | INTRAMUSCULAR | Status: AC
  Administered 2018-01-11: 40 mg via INTRAMUSCULAR

## 2018-01-11 MED ORDER — ALBUTEROL SULFATE HFA 108 (90 BASE) MCG/ACT IN AERS: INHALATION_SPRAY | RESPIRATORY_TRACT | 0 refills | Status: DC

## 2018-01-11 MED ORDER — PREDNISONE 50 MG PO TABS: 50.0000 mg | ORAL_TABLET | Freq: Every day | ORAL | 0 refills | Status: DC

## 2018-01-11 MED ORDER — IPRATROPIUM BROMIDE 0.02 % IN SOLN
0.5000 mg | Freq: Once | RESPIRATORY_TRACT | Status: AC
Start: 2018-01-11 — End: 2018-01-11
  Administered 2018-01-11: 0.5 mg via RESPIRATORY_TRACT

## 2018-01-11 MED ORDER — KETOROLAC TROMETHAMINE 30 MG/ML IJ SOLN
30.0000 mg | Freq: Once | INTRAMUSCULAR | Status: AC
  Administered 2018-01-11: 30 mg via INTRAMUSCULAR

## 2018-01-11 MED FILL — VENTOLIN HFA 90 MCG INHALER: 108 (90 BAS | 25 days supply | Qty: 18 | Fill #0

## 2018-01-11 MED FILL — predniSONE 20 MG TABS: 20 | 7 days supply | Qty: 7 | Fill #0

## 2018-01-11 NOTE — Patient Instructions (Addendum)
It was good to see you today.  Take the prednisone 1 pill a day for 5 days.  I have refilled your inhaler for you at home.    Try Cetirizine to help calm down your asthma as well.    Toradol shot today for your headache. This is an over the counter allergy medicine.   Come back and see [place] if you're not having any improvement in the next few days.  Don't wait if you have any worsening.

## 2018-01-11 NOTE — Progress Notes (Signed)
Subjective:    [name] is a 52 y.o. male who presents to [facility] today for asthma exacerbation:  1.  Asthma exacerbation:  Patient with history of asthma that flares this time of year with change in seasons.  States every year around this time he begins to have increased wheezing, SOB.  Started about 2 weeks ago.  Using his albuterol inhaler multiple times a day.  Previously was using it roughly around 1 time a month or less.  Does not feel this is really helping.  No fevers or chills.  No recent illnesses.  No sick contacts.  He has had a headache for the past couple days.  He is taking 1 ibuprofen several days ago without relief.  Describes his tension/tightening pain.  No changes in his vision.  No nausea or vomiting.  No chest pain or lower extremity edema.  ROS as above per HPI.    The following portions of the patient's history were reviewed and updated as appropriate: allergies, current medications, past medical history, family and social history, and problem list. Patient is a nonsmoker.    PMH reviewed.  Past Medical History:  Diagnosis Date  . ADD (attention deficit disorder)   . ADD (attention deficit disorder)   . Anemia, iron deficiency   . Asthma   . Blood transfusion   . Constipation   . Depression   . Depression   . Exertional dyspnea    secondary to anemia  . Hiatal hernia    No past surgical history on file.  Medications reviewed. Current Outpatient Medications  Medication Sig Dispense Refill  . albuterol (VENTOLIN HFA) 108 (90 Base) MCG/ACT inhaler INHALE 2 PUFFS EVERY 6 HOURS AS NEEDED FOR WHEEZING OR SHORTNESS OF BREATH 18 g 0  . buPROPion (WELLBUTRIN XL) 150 MG 24 hr tablet Take 1 tablet (150 mg total) by mouth daily. 30 tablet 2  . carbamide peroxide (DEBROX) 6.5 % OTIC solution Place 5 drops into the right ear 2 (two) times daily. 15 mL 0  . sertraline (ZOLOFT) 50 MG tablet Take 1 tablet (50 mg total) by mouth daily. 30 tablet 3   No current  facility-administered medications for this visit.      Objective:   Physical Exam BP 130/90   Pulse 96   Temp 97.7 F (36.5 C) (Oral)   Ht  (1.803 m)   SpO2 96%   BMI 36.96 kg/m  Gen:  Alert, cooperative patient who appears stated age in no acute distress.  Vital signs reviewed. HEENT: EOMI,  MMM.  PERRL Neck:  No JVD Cardiac:  Regular rate and rhythm  Pulm: Wheezing in all lung fields.  Decreased breath sounds at bases.  No increased work of breathing/retractions. Neuro:  No focal deficits noted throughout.     Imp/Plan: 1. Asthma exacerbation:  - triggered by change in weather/seasonal allergies - refilled his asthma - treated with Duonebs here in clinic x 2.  Lung exam greatly improved s/p second treatment with duonebs.  - Prednisone x 5 days.  - Cetirizine to help with allergies and any histamine contribution to his symptoms.    2.  Headache: - tension type.  No red flags - Toradol for relief here.  - FU if no improvement in next day or so.  Sooner if worsening.

## 2018-01-11 NOTE — Addendum Note (Signed)
Addended byGwendolyn Grant, Newt Lukes on: 01/11/2018 04:04 PM   Modules accepted: Orders

## 2018-01-11 NOTE — Addendum Note (Signed)
Addended by: Lamonte Sakai, Camaron Cammack D on: 01/11/2018 04:04 PM   Modules accepted: Orders

## 2018-01-12 NOTE — Progress Notes (Signed)
Pt aware per 4.29.19 e-mail chain

## 2018-01-13 ENCOUNTER — Encounter: Payer: Self-pay | Admitting: Pulmonary Disease

## 2018-01-13 ENCOUNTER — Ambulatory Visit (INDEPENDENT_AMBULATORY_CARE_PROVIDER_SITE_OTHER): Payer: Self-pay | Admitting: Pulmonary Disease

## 2018-01-13 VITALS — BP 138/86 | HR 90 | Ht 71.0 in | Wt 274.8 lb

## 2018-01-13 DIAGNOSIS — R400 Somnolence: Secondary | ICD-10-CM

## 2018-01-13 DIAGNOSIS — J4521 Mild intermittent asthma with (acute) exacerbation: Secondary | ICD-10-CM

## 2018-01-13 DIAGNOSIS — F909 Attention-deficit hyperactivity disorder, unspecified type: Secondary | ICD-10-CM

## 2018-01-13 DIAGNOSIS — F330 Major depressive disorder, recurrent, mild: Secondary | ICD-10-CM

## 2018-01-13 DIAGNOSIS — G4733 Obstructive sleep apnea (adult) (pediatric): Secondary | ICD-10-CM

## 2018-01-13 NOTE — Assessment & Plan Note (Signed)
February 1 to May 8 home CPAP download reveals: 13 days of use greater than 4 hours 20 days of use under 4 hours 64 days not used For a total of 97 days Total hours used 127 and 43 minutes Median daily usage 3 hours and 34 minutes >>Large gap from middle of April to beginning of May with no use at all due to homelessness  Referral to behavioral health for psych and sleep specialist Referral to [facility] and for community resources [facility] [address] for community resources Work with family and friends in order to ensure safe place to live and use CPAP daily at night Follow-up up in office in 2 months with Dr. Vassie Loll

## 2018-01-13 NOTE — Patient Instructions (Addendum)
>>  Start taking Zoloft in the morning instead of in the afternoon order to help improve sleep >>Work with community resources and referral over the next 2 months to ensure safe place to sleep as well as able to use CPAP >>Follow-up with Primary care closely to update them on the status of your homelessness as well as need for help with community resources >> Work with friends and family to ensure appropriate sleeping situations >> follow-up with primary care for depression  Follow up in 2 months, bring CPAP with you in order to get a download   -Keep up the hard work using your device.  -Do not drive or operate heavy machinery if tired or drowsy.  -Please notify the supply company and office if you are unable to use your device regularly due to missing supplies or machine being broken.  -Work on maintaining a healthy weight and following your recommended nutrition plan  -Maintain proper daily exercise and movement  -Maintaining proper use of your device can also help improve management of other chronic illnesses such as: Blood pressure, blood sugars, and weight management.   Referral with [facility] for community resources  [facility] [address], [place] [address] [phone]  Behavioral health referral [address] [place], [place] [address] [phone]

## 2018-01-13 NOTE — Assessment & Plan Note (Signed)
Improved on assessment today Continue with current regimen from family primary care visit Call with any changes or worsening shortness of breath Follow-up in 2 months

## 2018-01-13 NOTE — Assessment & Plan Note (Signed)
Continue current regimen Zoloft in the morning instead of in the evening Follow-up with community resources in order to ensure safe place to live Follow-up with Dr. Vassie Loll in 2 months

## 2018-01-13 NOTE — Assessment & Plan Note (Signed)
Continue follow-up with family medicine Referral to behavioral health for depression and sleep specialist

## 2018-01-13 NOTE — Progress Notes (Signed)
  ID: [name], male    DOB: Mar 14, 1966, 52 y.o.   MRN: [id number]  Chief Complaint  Patient presents with  . Follow-up    OSA-Asthma    Referring provider: [name], MD  HPI:  52 year old male never smoker followed for moderate sleep apnea on CPAP with persistent daytime hypersomnia.  Chart review reveals confusion with stimulant medications multiple pharmacies as well as multiple providers to manage stimulant medications in the past.  Resulting in no stimulant medications being prescribed from the bowel or pulmonary.  Patient has been complaining of progressive daytime sleepiness for past several years.  Has been followed by Dr. Maple Hudson.  Patient currently working with primary care for following testosterone levels as well as depression.  Patient recently seen in primary care for asthma flare currently on prednisone taper. Patient seen by [facility] for managing depression as well as his stimulant medications.  Past medical history significant for depression, GERD, OSA, ADHD, Daytime Somnolence, previous stimulant use  Test 04/2014- NPSG was positive for moderate obstructive sleep apnea with an AHI of 16.9/h with moderate snoring and desaturation to 86%.   01/02/2018-sleep study- Impression: severe obstructive sleep apnea with an AHI of 57.1  >>no significant central sleep apnea, moderate oxygen desaturation minimum O2 77%, patient snored with loud snoring volume, no cardiac abnormalities, clinically significant periodic limb movements did not occur during sleep Recommendations: therapeutic CPAP titration to determine optimal pressure >>MSLT would be indicative if you are remains sleepy in spite of adequate CPAP use >>Avoid alcohol sedatives and other CNS depressants >>Review sleep hygiene >>Work on healthy weight  Prior to 01/13/2018 appointment to bring CPAP with ST card in order for [place] to properly download and review his CPAP use and compliance.  01/13/2018 OSA  follow-up:  Patient presents today in office with continued daytime sleepiness.  Patient also reporting that he is currently homeless and lost his job 2 months ago.  Patient states that since this time is been sleeping in his car. Pt using CPAP during the day when able to nap at friends house.  Patient also reporting that this is causing difficulty with using his CPAP.  Patient adherent to his medications.  Patient reporting that sleepiness is the same as it was prior to losing his job and becoming homeless.  Patient also reporting that he is no longer's being seen at [facility] and is being managed by primary care due to scheduling issues with [facility].  Pt still reporting day time sleepiness. Currently sleeping 2-6 hours in his car broken up at night. Pt also getting 1-2 hours of day time naps when able to find a safe place. Pt uses CPAP for naps currently only.    February 1 to May 8 home CPAP download reveals: 13 days of use greater than 4 hours 20 days of use under 4 hours 64 days not used For a total of 97 days Total hours used 127 and 43 minutes Median daily usage 3 hours and 34 minutes >>Large gap from middle of April to beginning of May with no use at all due to homelessness   No Known Allergies  There is no immunization history for the selected administration types on file for this patient.  Past Medical History:  Diagnosis Date  . ADD (attention deficit disorder)   . ADD (attention deficit disorder)   . Anemia, iron deficiency   . Asthma   . Blood transfusion   . Constipation   . Depression   .  Depression   . Exertional dyspnea    secondary to anemia  . Hiatal hernia     Tobacco History: Social History   Tobacco Use  Smoking Status Never Smoker  Smokeless Tobacco Never Used   Counseling given: Not Answered   Outpatient Encounter Medications as of 01/13/2018  Medication Sig  . albuterol (VENTOLIN HFA) 108 (90 Base) MCG/ACT inhaler INHALE 2 PUFFS EVERY 6 HOURS AS  NEEDED FOR WHEEZING OR SHORTNESS OF BREATH  . buPROPion (WELLBUTRIN XL) 150 MG 24 hr tablet Take 1 tablet (150 mg total) by mouth daily.  . predniSONE (DELTASONE) 20 MG tablet Take 1 tablet (20 mg total) by mouth daily with breakfast. X 7 days. DISREGARD PRIOR 50 MG SCRIPT  . sertraline (ZOLOFT) 50 MG tablet Take 1 tablet (50 mg total) by mouth daily.  . carbamide peroxide (DEBROX) 6.5 % OTIC solution Place 5 drops into the right ear 2 (two) times daily. (Patient not taking: Reported on 01/13/2018)   No facility-administered encounter medications on file as of 01/13/2018.     Review of Systems  Constitutional:   +fatigue No  weight loss, night sweats,  Fevers, chills  HEENT:   No headaches,  Difficulty swallowing,  Tooth/dental problems, or  Sore throat, No sneezing, itching, ear ache, nasal congestion, post nasal drip,   CV:  No chest pain,  Orthopnea, PND, swelling in lower extremities, anasarca, dizziness, palpitations, syncope.   GI  No heartburn, indigestion, abdominal pain, nausea, vomiting, diarrhea, change in bowel habits, loss of appetite, bloody stools.   Resp: No shortness of breath with exertion or at rest.  No excess mucus, no productive cough,  No non-productive cough,  No coughing up of blood.  No change in color of mucus.  No wheezing.  No chest wall deformity  Skin: no rash or lesions.  GU: no dysuria, change in color of urine, no urgency or frequency.  No flank pain, no hematuria   MS:  No joint pain or swelling.  No decreased range of motion.  No back pain.  Socioeconomic: Homeless as of 2months ago, see HPI    Physical Exam  BP 138/86 (BP Location: Left Arm, Cuff Size: Normal)   Pulse 90   Ht  (1.803 m)   Wt 274 lb 12.8 oz (124.6 kg)   SpO2 96%   BMI 38.33 kg/m   GEN: A/Ox3; pleasant , NAD, well nourished    HEENT:  /AT,  EACs-cerumen bilaterally, TMs-Right ear unable to utilize due to cerumen, left TM within normal limits, NOSE-clear, THROAT-clear,  no lesions, no postnasal drip or exudate noted.   NECK:  Supple w/ fair ROM; no JVD; normal carotid impulses w/o bruits; no thyromegaly or nodules palpated; no lymphadenopathy.    RESP  Clear  P & A; w/o, wheezes/ rales/ or rhonchi. no accessory muscle use, no dullness to percussion  CARD:  RRR, no m/r/g, no peripheral edema, pulses intact, no cyanosis or clubbing.  GI:   Soft & nt; nml bowel sounds; no organomegaly or masses detected.   Musco: Warm bil, no deformities or joint swelling noted.   Neuro: alert, no focal deficits noted.    Skin: Warm, no lesions or rashes    Lab Results:  CBC    Component Value Date/Time   WBC 7.5 09/14/2017 1157   WBC 7.6 09/07/2017 1248   RBC 5.07 09/14/2017 1157   RBC 5.10 09/07/2017 1248   HGB 13.0 09/14/2017 1157   HCT 41.0 09/14/2017 1157  PLT 302 09/14/2017 1157   MCV 81 09/14/2017 1157   MCH 25.6 (L) 09/14/2017 1157   MCH 26.1 09/07/2017 1248   MCHC 31.7 09/14/2017 1157   MCHC 32.2 09/07/2017 1248   RDW 14.0 09/14/2017 1157   LYMPHSABS 1.1 09/14/2017 1157   MONOABS 702 01/07/2016 1509   EOSABS 0.2 09/14/2017 1157   BASOSABS 0.1 09/14/2017 1157    BMET    Component Value Date/Time   NA 139 09/14/2017 1157   K 4.5 09/14/2017 1157   CL 104 09/14/2017 1157   CO2 22 09/14/2017 1157   GLUCOSE 99 09/14/2017 1157   GLUCOSE 92 09/07/2017 1248   BUN 14 09/14/2017 1157   CREATININE 1.05 09/14/2017 1157   CREATININE 1.26 05/29/2014 0847   CALCIUM 9.3 09/14/2017 1157   GFRNONAA 81 09/14/2017 1157   GFRNONAA 67 05/29/2014 0847   GFRAA 94 09/14/2017 1157   GFRAA 77 05/29/2014 0847    BNP No results found for: BNP  ProBNP No results found for: PROBNP  Imaging: No results found.    Assessment & Plan:   Mild intermittent asthma with acute exacerbation Improved on assessment today Continue with current regimen from family primary care visit Call with any changes or worsening shortness of breath Follow-up in 2  months   OSA (obstructive sleep apnea) February 1 to May 8 home CPAP download reveals: 13 days of use greater than 4 hours 20 days of use under 4 hours 64 days not used For a total of 97 days Total hours used 127 and 43 minutes Median daily usage 3 hours and 34 minutes >>Large gap from middle of April to beginning of May with no use at all due to homelessness  Referral to behavioral health for psych and sleep specialist Referral to [facility] and for community resources [facility] [address] for community resources Work with family and friends in order to ensure safe place to live and use CPAP daily at night Follow-up up in office in 2 months with Dr. Vassie Loll    Depression Continue follow-up with family medicine Referral to behavioral health for depression and sleep specialist   Daytime somnolence Continue current regimen Zoloft in the morning instead of in the evening Follow-up with community resources in order to ensure safe place to live Follow-up with Dr. Vassie Loll in 2 months   I have personally discussed this plan with Dr. Vassie Loll as well as with Dr. Maple Hudson.  This appointment took 50 minutes with greater than 50% of that time being coordinating referrals, resources, patient education.   Coral Ceo, NP 01/13/2018

## 2018-01-16 ENCOUNTER — Encounter: Payer: Self-pay | Admitting: Family Medicine

## 2018-01-18 ENCOUNTER — Encounter: Payer: Self-pay | Admitting: Family Medicine

## 2018-01-18 ENCOUNTER — Other Ambulatory Visit: Payer: Self-pay | Admitting: Family Medicine

## 2018-01-20 ENCOUNTER — Encounter: Payer: Self-pay | Admitting: Adult Health

## 2018-01-21 ENCOUNTER — Other Ambulatory Visit: Payer: Self-pay | Admitting: Family Medicine

## 2018-01-21 MED ORDER — BUPROPION HCL ER (XL) 150 MG PO TB24: 150.0000 mg | ORAL_TABLET | Freq: Every day | ORAL | 0 refills | Status: DC

## 2018-01-21 MED ORDER — SERTRALINE HCL 50 MG PO TABS: 50.0000 mg | ORAL_TABLET | Freq: Every day | ORAL | 0 refills | Status: DC

## 2018-01-21 MED FILL — SERTRALINE HCL 50 MG TABLET: 50 | 30 days supply | Qty: 30 | Fill #0 | Status: TO

## 2018-01-21 MED FILL — BUPROPION HCL XL 150 MG TAB: 150 | 30 days supply | Qty: 30 | Fill #0 | Status: TO

## 2018-02-09 NOTE — Progress Notes (Signed)
[name] has come by my office at the [facility] [employer] [facility] [employer] in late May as well as today.  He has come to me for resources in the community.  He has lost his job and is now homeless, sleeping in his car.  One of the main problems he has is he is unable to use his CPAP machine without having electricity since he lives in his car.  He states because of his sleep apnea, and prior to being diagnosed and given the CPAP, he fell asleep at work causing him to lose his job.  [facility] because members have been paying guest fees for him to use the shower facilities.  He states she [facility] from Dr. Vassie LollAlva but hadn't heard anything from them yet.  I have gotten him a list of community resources but he has been in contact with all of the ones that are applicable to him.  He has had no luck getting help with finding a place to live.  He has also [facility] at [facility].  I will place a referral for a [facility] social worker to contact him.

## 2018-02-21 ENCOUNTER — Encounter

## 2018-02-21 ENCOUNTER — Ambulatory Visit (HOSPITAL_COMMUNITY): Payer: Self-pay | Admitting: Psychiatry

## 2018-02-24 ENCOUNTER — Encounter (HOSPITAL_COMMUNITY): Payer: Self-pay | Admitting: Psychiatry

## 2018-02-24 ENCOUNTER — Ambulatory Visit (INDEPENDENT_AMBULATORY_CARE_PROVIDER_SITE_OTHER): Payer: Self-pay | Admitting: Psychiatry

## 2018-02-24 VITALS — BP 122/70 | HR 84 | Ht 70.5 in | Wt 268.0 lb

## 2018-02-24 DIAGNOSIS — G4710 Hypersomnia, unspecified: Secondary | ICD-10-CM

## 2018-02-24 DIAGNOSIS — G4730 Sleep apnea, unspecified: Secondary | ICD-10-CM

## 2018-02-24 NOTE — Progress Notes (Signed)
Psychiatric Initial Adult Assessment   Patient Identification: [name] MRN:  [id number] Date of Evaluation:  02/24/2018 Referral Source: primary care, self Chief Complaint:  adhd request for management Visit Diagnosis:    ICD-10-CM   1. 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  He has been on stimulants for many years prescribed by [facility] and previously by the [facility].  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    Reviewed some of his past history and growing up in [place].  He shares that he is estranged from all of his family members, and is quite guarded about why this is the case.  He shares that he is living out of his car because he lost his job recently and again is fairly vague about why this is the case.  I asked about issues of psychiatric hospitalization, and he shares that he was hospitalized once about 15 years ago.  He is guarded regarding legal charges and incarceration, but does eventually share that he was incarcerated and has legal charges from when he was a young adult.  He was not willing to share any more information regarding these charges.  With regard to what brings him here today, he reports that his pulmonologist sent him for psychiatric assessment to be prescribed stimulants for hypersomnia and narcolepsy symptoms.  He does not have symptoms of ADHD or a historical diagnosis and treatment of ADHD since childhood.  I shared with him that I am not a sleep physician, and I referred him to neurology for treatment of narcolepsy and/or idiopathic hypersomnia.  Spent time discussing some of the medications that they may be able to initiate and titrate with him.  He has complicating factors of sleep apnea and anemia requiring  periodic blood transfusions in the past.  No acute suicidality or major mood symptoms at this time.  He does not take Wellbutrin or Zoloft with any regularity.  No further follow-up in this office indicated.  I also shared with the patient that I am leaving this office in 2 months, and him having established care with neurology will allow for his continuity of treatment.  He demonstrated poor boundaries and asked if writer would hire him to work in the new psychiatric practice.  I shared with him that we are not hiring at this time, and I wished him best of luck in finding a job, he shares that he might be able to get a job at [employer], and is waiting to hear back from them.  Past Psychiatric History: One psychiatric hospitalization 15 years ago  Previous Psychotropic Medications: Yes   Substance Abuse History in the last 12 months:  No.  Consequences of Substance Abuse: Negative  Past Medical History:  Past Medical History:  Diagnosis Date  . ADD (attention deficit disorder)   . ADD (attention deficit disorder)   . Anemia, iron deficiency   . Asthma   . Blood transfusion   . Constipation   . Depression   . Depression   . Exertional dyspnea    secondary to anemia  . Hiatal hernia   . Sleep apnea 01/2018   History reviewed. No pertinent surgical history.  Family Psychiatric History: Denies any  Family History:  Family History  Problem Relation Age of  Onset  . Heart disease Mother   . Heart disease Father   . Diabetes Paternal Grandfather   . Cancer Neg Hx   . Stroke Neg Hx     Social History:   Social History   Socioeconomic History  . Marital status: Single    Spouse name: Not on file  . Number of children: 0  . Years of education: Not on file  . Highest education level: Not on file  Occupational History  . Occupation: student-Business  Social Needs  . Financial resource strain: Not on file  . Food insecurity:    Worry: Not on file    Inability: Not  on file  . Transportation needs:    Medical: Not on file    Non-medical: Not on file  Tobacco Use  . Smoking status: Never Smoker  . Smokeless tobacco: Never Used  Substance and Sexual Activity  . Alcohol use: No  . Drug use: No  . Sexual activity: Not Currently  Lifestyle  . Physical activity:    Days per week: Not on file    Minutes per session: Not on file  . Stress: Not on file  Relationships  . Social connections:    Talks on phone: Not on file    Gets together: Not on file    Attends religious service: Not on file    Active member of club or organization: Not on file    Attends meetings of clubs or organizations: Not on file    Relationship status: Not on file  Other Topics Concern  . Not on file  Social History Narrative   Lives alone. Never married, no children.  Has two sister who live in [place].  Student at work study.  [employer].  Working toward Chief Technology Officer"global logistics" program.  Previously worked in Geophysicist/field seismologistretail/food service.  Was also previously homeless due to job loss and psychiatric exacerbation.    Additional Social History: Homeless, lives in his car, has some college education and is trying to find a job  Allergies:  No Known Allergies  Metabolic Disorder Labs: No results found for: HGBA1C, MPG No results found for: PROLACTIN Lab Results  Component Value Date   CHOL 199 05/29/2014   TRIG 130 05/29/2014   HDL 47 05/29/2014   CHOLHDL 4.2 05/29/2014   VLDL 26 05/29/2014   LDLCALC 126 (H) 05/29/2014     Current Medications: Current Outpatient Medications  Medication Sig Dispense Refill  . albuterol (VENTOLIN HFA) 108 (90 Base) MCG/ACT inhaler INHALE 2 PUFFS EVERY 6 HOURS AS NEEDED FOR WHEEZING OR SHORTNESS OF BREATH 18 g 0   No current facility-administered medications for this visit.     Neurologic: Headache: Negative Seizure: Negative Paresthesias:Negative  Musculoskeletal: Strength & Muscle Tone: within normal limits Gait & Station:  normal Patient leans: N/A  Psychiatric Specialty Exam: ROS  Blood pressure 122/70, pulse 84, height 5' 10.5" (1.791 m), weight 268 lb (121.6 kg), SpO2 95 %.Body mass index is 37.91 kg/m.  General Appearance: Casual and Fairly Groomed  Eye Contact:  Good  Speech:  Clear and Coherent and Normal Rate  Volume:  Normal  Mood:  Euthymic  Affect:  Appropriate and Congruent  Thought Process:  Coherent and Descriptions of Associations: Intact  Orientation:  Full (Time, Place, and Person)  Thought Content:  Logical  Suicidal Thoughts:  No  Homicidal Thoughts:  No  Memory:  Immediate;   Good  Judgement:  Fair  Insight:  Fair  Psychomotor Activity:  Normal  Concentration:  Concentration: Fair  Recall:  [employer] of Knowledge:Good  Language: Good  Akathisia:  Negative  Handed:  Right  AIMS (if indicated):  na  Assets:  Scientist, forensic  ADL's:  Intact  Cognition: WNL  Sleep: Excessive    Treatment Plan Summary: [name] is a 52 year old male presenting with request of treatment for hypersomnia related to sleep apnea and chronic anemia.  He does not present with any major mood symptoms, or need for individual therapy at this time.  His primary complaint and request is to initiate treatments for treatment of chronic fatigue and hypersomnia.  He would benefit from being established with neurology for primary treatment related to hypersomnia and/or possible narcolepsy.  He has yet to have an MSLT and would benefit from completing that study, but I will defer to neurology on this.  He is treated with a CPAP machine and is followed by pulmonary.    1. Hypersomnia with sleep apnea    Status of current problems: New to [employer] Ordered: Orders Placed This Encounter  Procedures  . Ambulatory referral to Neurology    Referral Priority:   Urgent    Referral Type:   Consultation    Referral Reason:   Specialty Services Required    Requested Specialty:    Neurology    Number of Visits Requested:   1    Labs Reviewed: NA  Collateral Obtained/Records Reviewed: [place] controlled substance database,  Plan:  Refer to neurology for treatment of primary hypersomnia versus narcolepsy No further follow-up with psychiatry indicated at this time  Burnard Leigh, MD 6/20/201912:04 PM

## 2018-03-14 ENCOUNTER — Encounter (HOSPITAL_COMMUNITY): Payer: Self-pay | Admitting: Psychiatry

## 2018-03-14 NOTE — Progress Notes (Signed)
_0  ID: [name], male    DOB: 22-Apr-1966, 52 y.o.   MRN: [id number]  Chief Complaint  Patient presents with  . Follow-up    Not using Cpap, states his breathing is worse with exertion and ambulating than his normal. Minimal wheezing and denies chest pain or discomfort.     Referring provider: Kinnie Feil, MD  HPI: 52 year old male never smoker followed for moderate sleep apnea on CPAP with persistent daytime hypersomnia.  Chart review reveals confusion with stimulant medications multiple pharmacies as well as multiple providers to manage stimulant medications in the past.  Resulting in no stimulant medications being prescribed from the bowel or pulmonary.  Patient has been complaining of progressive daytime sleepiness for past several years.  Has been followed by Dr. Annamaria Boots.  Patient currently working with primary care for following testosterone levels as well as depression.  Patient recently seen in primary care for asthma flare currently on prednisone taper. Patient seen by [facility] for managing depression as well as his stimulant medications.  Past medical history significant for depression, GERD, OSA, ADHD, Daytime Somnolence, previous stimulant use   Recent [facility] Encounters:   01/13/2018 OSA follow-up:  Patient presents today in office with continued daytime sleepiness.  Patient also reporting that he is currently homeless and lost his job 2 months ago.  Patient states that since this time is been sleeping in his car. Pt using CPAP during the day when able to nap at friends house.  Patient also reporting that this is causing difficulty with using his CPAP.  Patient adherent to his medications.  Patient reporting that sleepiness is the same as it was prior to losing his job and becoming homeless.  Patient also reporting that he is no longer's being seen at [name] and is being managed by primary care due to scheduling issues with [facility]. Pt still reporting  day time sleepiness. Currently sleeping 2-6 hours in his car broken up at night. Pt also getting 1-2 hours of day time naps when able to find a safe place. Pt uses CPAP for naps currently only.   Issues with CPAP compliance due to homelessness.  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  Still working on obtaining a job.  No further follow-up with psychiatry as indicated at this time. 02/09/2018-[facility] visit looking for community resources and help finding a job.,  Placed referral for [facility] and social worker to contact him  Test 04/2014- NPSG was positive for moderate obstructive sleep apnea with an AHI of 16.9/h with moderate snoring and desaturation to 86%.   01/02/2018-sleep study- Impression: severe obstructive sleep apnea with an AHI of 57.1  >>no significant central sleep apnea, moderate oxygen desaturation minimum O2 77%, patient snored with loud snoring volume, no cardiac abnormalities, clinically significant periodic limb movements did not occur during sleep Recommendations: therapeutic CPAP titration to determine optimal pressure >>MSLT would be indicative if you are remains sleepy in spite of adequate CPAP use >>Avoid alcohol sedatives and other CNS depressants >>Review sleep hygiene >>Work on healthy weight   03/15/18 OV  52 year old patient of Dr. Elsworth Soho seen in office still has not been able to resume CPAP use.  Patient is homeless.  This is been an ongoing issue.  Patient reports that all of the resources that we have provided for him back in May did not help.  Patient is presented to [facility] for other community resources.  Patient is waiting to hear back about a job from [employer].  Patient reports that he is applied other jobs but cannot provide any information regarding this.  Patient reports that he is contacted local shelters and  list 3 of them but says that he has not called them lately.  Patient completed psychiatry referral with Dr. Daron Offer.  They do not feel that he needs additional psychiatry help at this time.  PHQ 9 remains elevated.  Patient is now off of all of his chronic medications.  Patient is unable to explain why he has not followed up with the pharmacy or family medicine regarding this.  Patient reports that he does not have any minutes on his phone, gas in his car, money to pay for them, but that the medications were free when he picked him up from the pharmacy in the past 2 months.  No Known Allergies  There is no immunization history for the selected administration types on file for this patient.  Past Medical History:  Diagnosis Date  . ADD (attention deficit disorder)   . ADD (attention deficit disorder)   . Anemia, iron deficiency   . Asthma   . Blood transfusion   . Constipation   . Depression   . Depression   . Exertional dyspnea    secondary to anemia  . Hiatal hernia   . Sleep apnea 01/2018    Tobacco History: Social History   Tobacco Use  Smoking Status Never Smoker  Smokeless Tobacco Never Used   Counseling given: Yes Continue not smoking  Outpatient Encounter Medications as of 03/15/2018  Medication Sig  . albuterol (VENTOLIN HFA) 108 (90 Base) MCG/ACT inhaler INHALE 2 PUFFS EVERY 6 HOURS AS NEEDED FOR WHEEZING OR SHORTNESS OF BREATH   No facility-administered encounter medications on file as of 03/15/2018.      Review of Systems  Review of Systems  Constitutional: Positive for fatigue. Negative for chills and fever.  HENT: Negative for congestion, postnasal drip, sinus pressure and sinus pain.   Respiratory: Positive for shortness of breath and wheezing. Negative for cough and chest tightness.   Cardiovascular: Positive for palpitations (with exertion ). Negative for chest pain.  Gastrointestinal: Negative for abdominal distention, abdominal pain, anal bleeding,  constipation, diarrhea, nausea and vomiting.  Genitourinary: Negative for difficulty urinating and hematuria.  Musculoskeletal: Negative for back pain.  Skin: Negative for color change and rash.  Neurological: Positive for light-headedness. Negative for dizziness and headaches.  Psychiatric/Behavioral: Positive for dysphoric mood (off all medications ) and sleep disturbance (sleeping in the car ). The patient is nervous/anxious.   All other systems reviewed and are negative.      Office Visit from 03/15/2018 in [facility]  PHQ-9 Total Score  24     PHQ 9 today is 24, last PHQ 9 was 25 from chart review      Physical Exam  BP 110/80   Pulse 83   Ht _0  (1.803 m)   Wt 268 lb 6.4 oz (121.7 kg)   SpO2 97%   BMI 37.43 kg/m   Wt Readings from Last 5 Encounters:  03/15/18 268 lb 6.4 oz (121.7 kg)  02/24/18 268 lb (121.6 kg)  01/13/18 274 lb 12.8 oz (124.6 kg)  01/03/18 265 lb (120.2 kg)  12/14/17 278 lb (126.1 kg)     Physical Exam  Constitutional: He is oriented to person, place, and time and well-developed, well-nourished,  and in no distress. No distress.  HENT:  Head: Normocephalic.  Right Ear: External ear normal.  Left Ear: External ear normal.  Nose: Nose normal.  Mouth/Throat: Oropharynx is clear and moist. No oropharyngeal exudate.  Eyes: Pupils are equal, round, and reactive to light.  Neck: Normal range of motion. Neck supple.  Cardiovascular: Normal rate, regular rhythm and normal heart sounds.  Pulmonary/Chest: Effort normal and breath sounds normal. No respiratory distress. He has no wheezes.  Abdominal: Soft. Bowel sounds are normal. He exhibits no distension. There is no rebound.  Musculoskeletal: Normal range of motion. He exhibits no edema.  Lymphadenopathy:    He has no cervical adenopathy.  Neurological: He is alert and oriented to person, place, and time. Gait normal.  Skin: Skin is warm and dry. He is not diaphoretic.  Psychiatric:  His mood appears anxious. His affect is labile and inappropriate. He is not agitated. He exhibits a depressed mood. He expresses no homicidal and no suicidal ideation. He expresses no suicidal plans and no homicidal plans. He is apathetic. He has a flat affect.  Patient denies homicidal or suicidal thoughts.  Patient denies any plan for homicide or suicide at this time. Patient is quite flat on exam.  With inappropriate responses unable to explain why he has not followed up with persistent chronic needs regarding his depression, housing instability.  Nursing note and vitals reviewed.    Lab Results:  CBC    Component Value Date/Time   WBC 7.5 09/14/2017 1157   WBC 7.6 09/07/2017 1248   RBC 5.07 09/14/2017 1157   RBC 5.10 09/07/2017 1248   HGB 13.0 09/14/2017 1157   HCT 41.0 09/14/2017 1157   PLT 302 09/14/2017 1157   MCV 81 09/14/2017 1157   MCH 25.6 (L) 09/14/2017 1157   MCH 26.1 09/07/2017 1248   MCHC 31.7 09/14/2017 1157   MCHC 32.2 09/07/2017 1248   RDW 14.0 09/14/2017 1157   LYMPHSABS 1.1 09/14/2017 1157   MONOABS 702 01/07/2016 1509   EOSABS 0.2 09/14/2017 1157   BASOSABS 0.1 09/14/2017 1157    BMET    Component Value Date/Time   NA 139 09/14/2017 1157   K 4.5 09/14/2017 1157   CL 104 09/14/2017 1157   CO2 22 09/14/2017 1157   GLUCOSE 99 09/14/2017 1157   GLUCOSE 92 09/07/2017 1248   BUN 14 09/14/2017 1157   CREATININE 1.05 09/14/2017 1157   CREATININE 1.26 05/29/2014 0847   CALCIUM 9.3 09/14/2017 1157   GFRNONAA 81 09/14/2017 1157   GFRNONAA 67 05/29/2014 0847   GFRAA 94 09/14/2017 1157   GFRAA 77 05/29/2014 0847    BNP No results found for: BNP  ProBNP No results found for: PROBNP  Imaging: No results found.   Assessment & Plan:   Pleasant 52 year old patient seen in office today.  Unfortunately there are a myriad of psychosocial and financial barriers to caring for Mr. Orie Rout.  Unfortunately patient remains homeless, reporting that he is followed  up with local shelters, is estranged from friends and family and is unable to stay with them.  Patient is still sleeping in his car.  Instructed patient to make sure that he is sleeping with his head up as his obstructive sleep apnea is severe.  Emphasized the importance of the patient follow-up with social services to apply for Medicaid to help obtain insurance coverage or temporary housing.  Patient denied need to do this.  Attempted to provide patient assistance program for albuterol inhaler.  Patient  denied need to fill out paperwork as he says that he would not qualify for this.  Had extensive discussions with patient regarding his depression and anxiety.  Informed patient that he should follow-up with family medicine as well as get refills of his medications from the outpatient pharmacy.  Suggested that patient start using [facility] as he could walk to this from this office here.  Provided that contact information.  Also extensively discussed with patient that if depression worsens, he starts having suicidal ideations, or homicidal ideations that he can present to behavioral health hospital, emergency room, or contact 911.  Patient denies plans of suicide or homicide at this time.  Patient denies having any thoughts of suicide or homicide at this time.  Also provided patient with local list of therapist as well as psychiatrist who patient can see.  Unfortunately until these keep barriers are dressed struggle to see how we can successfully treat his obstructive sleep apnea and his hypersomnia.  We will have patient have a standing appointment in 3 months to see Dr. Elsworth Soho.  If patient is able to obtain temporary housing use his CPAP regularly then he is more than welcome to follow-up with our office sooner.  Emphasized the importance of following up with family medicine as well as obtaining his chronic medications.  OSA (obstructive sleep apnea)   Follow-up with our office after  obtaining these medications, follow-up with primary care, when you are able to resume CPAP use.  If you are able to obtain temporary housing, resume CPAP use.  Follow-up with our office so we can get you scheduled for CPAP compliance appointment.  And then we can work towards getting MSLT test  Follow-up here in 3 months with Dr. Elsworth Soho    Depression Contact primary care as well as outpatient pharmacy regarding your medication status >>> Can use [facility] to help with medications >>> Notify them that you need refills of your medication  [facility] [place]. [address] [place], [place] [address] Telephone number: [phone]  I highly recommend that you obtain your medications and resume use  We will also provide list of local options for continued psychiatry and therapist.  Please review this information and follow-up with them.  Continue to follow-up with local shelters, family and friends to ask for help obtaining permanent housing or temporary housing  Other options discussed with you today in our office visit are still available such as presenting to behavioral health hospital for management of depression.  If you become suicidal or homicidal you need to follow-up with behavioral health Hospital, present to an emergency room, or contact 911.  [facility] [address] [place], [place] [address] Telephone number: [phone]     Mild intermittent asthma with acute exacerbation Refilled albuterol inhaler today Patient denied need for patient assistance for albuterol inhaler Encourage patient to follow-up with outpatient pharmacies to obtain chronic medications  This appointment was 46 minutes along with over 50% of the time in direct face-to-face patient care, assessment, plan of care discussion, follow-up, discussion of resources available and resources to use.   Lauraine Rinne, NP 03/15/2018

## 2018-03-15 ENCOUNTER — Ambulatory Visit (INDEPENDENT_AMBULATORY_CARE_PROVIDER_SITE_OTHER): Payer: Self-pay | Admitting: Pulmonary Disease

## 2018-03-15 ENCOUNTER — Encounter: Payer: Self-pay | Admitting: Pulmonary Disease

## 2018-03-15 ENCOUNTER — Other Ambulatory Visit: Payer: Self-pay | Admitting: Family Medicine

## 2018-03-15 ENCOUNTER — Telehealth: Payer: Self-pay | Admitting: Pulmonary Disease

## 2018-03-15 DIAGNOSIS — J4521 Mild intermittent asthma with (acute) exacerbation: Secondary | ICD-10-CM

## 2018-03-15 DIAGNOSIS — G4733 Obstructive sleep apnea (adult) (pediatric): Secondary | ICD-10-CM

## 2018-03-15 DIAGNOSIS — F332 Major depressive disorder, recurrent severe without psychotic features: Secondary | ICD-10-CM

## 2018-03-15 MED ORDER — ALBUTEROL SULFATE HFA 108 (90 BASE) MCG/ACT IN AERS: INHALATION_SPRAY | RESPIRATORY_TRACT | 0 refills | Status: DC

## 2018-03-15 MED FILL — buPROPion HCL ER (XL) 150 M: 150 | 30 days supply | Qty: 30 | Fill #1

## 2018-03-15 MED FILL — VENTOLIN HFA 90 MCG INHALER: 108 (90 BAS | 25 days supply | Qty: 18 | Fill #0

## 2018-03-15 MED FILL — SERTRALINE HCL 50 MG TABLET: 50 | 30 days supply | Qty: 30 | Fill #1

## 2018-03-15 NOTE — Telephone Encounter (Signed)
Sent refill of pt's albuterol inhaler to [facility].   Attempted to call pt to let him know the Rx was sent but unable to reach pt and unable to leave a VM due to mailbox being full.  [facility] and [facility] with [facility] letting her know script of Albuterol inhaler was sent in for pt.  Maxine GlennMonica also asked me about a depression med for pt and I stated to her NP stated for pt to call PCP to discuss this.  Nothing further needed.

## 2018-03-15 NOTE — Assessment & Plan Note (Signed)
Refilled albuterol inhaler today Patient denied need for patient assistance for albuterol inhaler Encourage patient to follow-up with outpatient pharmacies to obtain chronic medications

## 2018-03-15 NOTE — Assessment & Plan Note (Signed)
Contact primary care as well as outpatient pharmacy regarding your medication status >>> Can use [place] outpatient pharmacy to help with medications >>> Notify them that you need refills of your medication  [facility] [address] [place], [place] [address] Telephone number: [phone]  I highly recommend that you obtain your medications and resume use  We will also provide list of local options for continued psychiatry and therapist.  Please review this information and follow-up with them.  Continue to follow-up with local shelters, family and friends to ask for help obtaining permanent housing or temporary housing  Other options discussed with you today in our office visit are still available such as presenting to behavioral health hospital for management of depression.  If you become suicidal or homicidal you need to follow-up with behavioral health Hospital, present to an emergency room, or contact 911.  [facility] [address] [place], [place] [address] Telephone number: [phone]

## 2018-03-15 NOTE — Assessment & Plan Note (Signed)
   Follow-up with our office after obtaining these medications, follow-up with primary care, when you are able to resume CPAP use.  If you are able to obtain temporary housing, resume CPAP use.  Follow-up with our office so we can get you scheduled for CPAP compliance appointment.  And then we can work towards getting MSLT test  Follow-up here in 3 months with Dr. Vassie LollAlva

## 2018-03-15 NOTE — Patient Instructions (Addendum)
Contact primary care as well as outpatient pharmacy regarding your medication status >>> Can use [facility] outpatient pharmacy to help with medications >>> Notify them that you need refills of your medication  [facility] [address] [place], [place] [address] Telephone number: [phone]  I highly recommend that you obtain your medications and resume use  We will also provide list of local options for continued psychiatry and therapist.  Please review this information and follow-up with them.  Continue to follow-up with local shelters, family and friends to ask for help obtaining permanent housing or temporary housing  Other options discussed with you today in our office visit are still available such as presenting to behavioral health hospital for management of depression.  If you become suicidal or homicidal you need to follow-up with behavioral health Hospital, present to an emergency room, or contact 911.  [facility] [address] [place], [place] [address] Telephone number: [phone]  Follow-up with our office after obtaining these medications, follow-up with primary care, when you are able to resume CPAP use.  If you are able to obtain temporary housing, resume CPAP use.  Follow-up with our office so we can get you scheduled for CPAP compliance appointment.  And then we can work towards getting MSLT test  Follow-up here in 3 months with Dr. Vassie LollAlva     Please contact the office if your symptoms worsen or you have concerns that you are not improving.   Thank you for choosing [facility] for your healthcare, and for allowing us to partner with you on your healthcare journey. I am thankful to be able to provide care to you today.   Elisha HeadlandBrian Cyprian Gongaware FNP-C

## 2018-03-17 ENCOUNTER — Encounter (INDEPENDENT_AMBULATORY_CARE_PROVIDER_SITE_OTHER): Payer: Self-pay

## 2019-01-06 ENCOUNTER — Other Ambulatory Visit: Payer: Self-pay | Admitting: Pulmonary Disease

## 2019-01-06 MED FILL — ALBUTEROL SULFATE HFA 108 (: 108 (90 BAS | 25 days supply | Qty: 18 | Fill #0

## 2019-01-26 ENCOUNTER — Other Ambulatory Visit: Payer: Self-pay | Admitting: Pulmonary Disease

## 2019-01-26 MED FILL — ALBUTEROL SULFATE HFA 108 (: 108 (90 BAS | 25 days supply | Qty: 18 | Fill #0

## 2019-02-17 ENCOUNTER — Other Ambulatory Visit: Payer: Self-pay | Admitting: Pulmonary Disease

## 2019-02-17 ENCOUNTER — Telehealth: Payer: Self-pay | Admitting: Internal Medicine

## 2019-02-17 NOTE — Telephone Encounter (Signed)
Pt calling stating that pharm is saying that they havrn't received his request for his albuterol inhaler and says that he is supposed to get it @ no cost? [facility].Hillery Hunter

## 2019-02-17 NOTE — Telephone Encounter (Signed)
Returned call Butch Penny states patient thought his rescue inhaler was free. Advised not knowledge of free meds sent to pharm. Nothing further needed.

## 2019-02-18 ENCOUNTER — Other Ambulatory Visit: Payer: Self-pay

## 2019-02-18 ENCOUNTER — Emergency Department (HOSPITAL_COMMUNITY): Payer: Self-pay

## 2019-02-18 ENCOUNTER — Emergency Department (HOSPITAL_COMMUNITY)
Admission: EM | Admit: 2019-02-18 | Discharge: 2019-02-18 | Disposition: A | Discharge status: Home / Self Care | Payer: Self-pay | Attending: Emergency Medicine | Admitting: Emergency Medicine

## 2019-02-18 DIAGNOSIS — J4521 Mild intermittent asthma with (acute) exacerbation: Secondary | ICD-10-CM | POA: Insufficient documentation

## 2019-02-18 DIAGNOSIS — Z20828 Contact with and (suspected) exposure to other viral communicable diseases: Secondary | ICD-10-CM | POA: Insufficient documentation

## 2019-02-18 DIAGNOSIS — Z79899 Other long term (current) drug therapy: Secondary | ICD-10-CM | POA: Insufficient documentation

## 2019-02-18 LAB — SARS CORONAVIRUS 2 BY RT PCR (HOSPITAL ORDER, PERFORMED IN ~~LOC~~ HOSPITAL LAB): SARS Coronavirus 2: NEGATIVE

## 2019-02-18 MED ORDER — IPRATROPIUM BROMIDE HFA 17 MCG/ACT IN AERS
2.0000 | INHALATION_SPRAY | Freq: Once | RESPIRATORY_TRACT | Status: AC
  Administered 2019-02-18: 2 via RESPIRATORY_TRACT
  Filled 2019-02-18: qty 12.9

## 2019-02-18 MED ORDER — ALBUTEROL SULFATE HFA 108 (90 BASE) MCG/ACT IN AERS
6.0000 | INHALATION_SPRAY | Freq: Once | RESPIRATORY_TRACT | Status: AC
  Administered 2019-02-18: 6 via RESPIRATORY_TRACT

## 2019-02-18 MED ORDER — PREDNISONE 20 MG PO TABS
60.0000 mg | ORAL_TABLET | Freq: Once | ORAL | Status: AC
  Administered 2019-02-18: 60 mg via ORAL
  Filled 2019-02-18: qty 3

## 2019-02-18 MED ORDER — ALBUTEROL SULFATE HFA 108 (90 BASE) MCG/ACT IN AERS
6.0000 | INHALATION_SPRAY | Freq: Once | RESPIRATORY_TRACT | Status: AC
  Administered 2019-02-18: 6 via RESPIRATORY_TRACT
  Filled 2019-02-18: qty 6.7

## 2019-02-18 NOTE — ED Provider Notes (Signed)
[facility]-EMERGENCY DEPT Provider Note   CSN: [id number] Arrival date & time: 02/18/19  1911   History   Chief Complaint Chief Complaint  Patient presents with   Asthma    HPI [name] is a 53 y.o. male with past medical history significant for asthma, anemia, depression, ADD, sleep apnea who presents for evaluation of shortness of breath.  Patient states he has been out of his albuterol rescue inhaler x1 week.  States he called his provider and was unable to get a refill as he needed to schedule an appointment for this.  Felt short of breath when he woke up this morning as well as wheezing.  Wheezing increased until he presented to the emergency department.  No fever, chills, nausea, vomiting, neck pain, neck stiffness, chest pain, cough, hemoptysis, abdominal pain, diarrhea, dysuria, dizziness, lightheadedness, diaphoresis, lower extremity pain, redness or warmth.  No history of PE or DVT.  Denies history of ACS.  Has not taken anything for symptoms.  Denies previous hospitalization or intubation for his asthma.  No known COVID exposures. Typically uses his rescue inhaler 1x week.  Refusing lab work at this time.  History obtained from patient.  No interpreter is used.     HPI  Past Medical History:  Diagnosis Date   ADD (attention deficit disorder)    ADD (attention deficit disorder)    Anemia, iron deficiency    Asthma    Blood transfusion    Constipation    Depression    Depression    Exertional dyspnea    secondary to anemia   Hiatal hernia    Sleep apnea 01/2018    Patient Active Problem List   Diagnosis Date Noted   Hypersomnia with sleep apnea 09/14/2017   Hypogonadism in male 09/14/2017   Bed bug bite 09/14/2017   Daytime somnolence 07/29/2014   OSA (obstructive sleep apnea) 05/29/2014   Iron deficiency anemia 12/22/2013   Fatigue 07/07/2013   Adult ADHD 07/07/2013   Dyspnea 03/01/2012   Anemia 01/07/2012    Depression 01/07/2012   GERD (gastroesophageal reflux disease) 01/07/2012   Mild intermittent asthma with acute exacerbation 01/07/2012    No past surgical history on file.      Home Medications    Prior to Admission medications   Medication Sig Start Date End Date Taking? Authorizing Provider  albuterol (VENTOLIN HFA) 108 (90 Base) MCG/ACT inhaler INHALE 2 PUFFS BY MOUTH EVERY 6 HOURS AS NEEDED FOR WHEEZING OR SHORTNESS OF BREATH 02/17/19   Jetty DuhamelYoung, Clinton D, MD  buPROPion (WELLBUTRIN XL) 150 MG 24 hr tablet TAKE 1 TABLET BY MOUTH DAILY. 03/15/18   Doreene ElandEniola, Kehinde T, MD  sertraline (ZOLOFT) 50 MG tablet TAKE 1 TABLET BY MOUTH DAILY. 03/15/18   Doreene ElandEniola, Kehinde T, MD    Family History Family History  Problem Relation Age of Onset   Heart disease Mother    Heart disease Father    Diabetes Paternal Grandfather    Cancer Neg Hx    Stroke Neg Hx     Social History Social History   Tobacco Use   Smoking status: Never Smoker   Smokeless tobacco: Never Used  Substance Use Topics   Alcohol use: No   Drug use: No     Allergies   Patient has no known allergies.   Review of Systems Review of Systems  Constitutional: Negative.   HENT: Negative.   Respiratory: Positive for shortness of breath. Negative for apnea, cough, choking, chest tightness,  wheezing and stridor.   Cardiovascular: Negative.   Gastrointestinal: Negative.   Genitourinary: Negative.   Musculoskeletal: Negative.   Skin: Negative.   Neurological: Negative.   All other systems reviewed and are negative.    Physical Exam Updated Vital Signs BP (!) 163/94    Pulse 96    Temp 97.9 F (36.6 C) (Oral)    Resp 20    Ht 5\' 11"  (1.803 m)    Wt 111.1 kg    SpO2 98%    BMI 34.17 kg/m   Physical Exam Vitals signs and nursing note reviewed.  Constitutional:      General: He is not in acute distress.    Appearance: He is well-developed. He is not ill-appearing, toxic-appearing or diaphoretic.  HENT:      Head: Normocephalic and atraumatic.     Nose: Nose normal.     Mouth/Throat:     Mouth: Mucous membranes are moist.     Pharynx: Oropharynx is clear.  Eyes:     Pupils: Pupils are equal, round, and reactive to light.  Neck:     Musculoskeletal: Normal range of motion and neck supple.  Cardiovascular:     Rate and Rhythm: Normal rate and regular rhythm.     Pulses: Normal pulses.     Heart sounds: Normal heart sounds. No murmur. No friction rub. No gallop.   Pulmonary:     Effort: Pulmonary effort is normal. No respiratory distress.     Comments: Diffuse inspiratory and expiratory wheeze throughout.  Speaks in full sentences without difficulty.  No accessory muscle usage.  Mild tachypnea. Abdominal:     General: There is no distension.     Palpations: Abdomen is soft.     Comments: Soft, Nontender without rebound or guarding.  Normoactive bowel sounds.  Musculoskeletal: Normal range of motion.     Comments: Moves all 4 extremities without difficulty.  No lower extreme edema, erythema, ecchymosis or warmth.  Homans sign negative  Skin:    General: Skin is warm and dry.     Comments: Brisk capillary refill.  No rashes or lesions.  Neurological:     Mental Status: He is alert.     Comments: Cranial nerves II through XII grossly intact.  No facial droop.  Phonation normal.  Ambulatory in ED without difficulty      ED Treatments / Results  Labs (all labs ordered are listed, but only abnormal results are displayed) Labs Reviewed  SARS CORONAVIRUS 2 (HOSPITAL ORDER, PERFORMED IN [facility])    EKG EKG Interpretation  Date/Time:  Saturday February 18 2019 19:38:31 EDT Ventricular Rate:  99 PR Interval:    QRS Duration: 99 QT Interval:  337 QTC Calculation: 433 R Axis:   110 Text Interpretation:  Sinus rhythm Probable left atrial enlargement Left posterior fascicular block No significant change since last tracing Confirmed by Raeford RazorKohut, Stephen [phone]([id number]) on 02/18/2019  7:41:48 PM   Radiology Dg Chest 2 View  Result Date: 02/18/2019 CLINICAL DATA:  Recurrent asthma attacks, no inhaler, audible wheezing with inspiration and expiration EXAM: CHEST - 2 VIEW COMPARISON:  09/07/2017 FINDINGS: Upper normal heart size. Mediastinal contours and pulmonary vascularity normal. Lungs clear. No infiltrate, pleural effusion or pneumothorax. Bones unremarkable. IMPRESSION: No acute abnormalities. Electronically Signed   By: Ulyses SouthwardMark  Boles M.D.   On: 02/18/2019 19:54    Procedures Procedures (including critical care time)  Medications Ordered in ED Medications  albuterol (VENTOLIN HFA) 108 (90 Base) MCG/ACT inhaler 6  puff (6 puffs Inhalation Given 02/18/19 1943)  predniSONE (DELTASONE) tablet 60 mg (60 mg Oral Given 02/18/19 2004)  ipratropium (ATROVENT HFA) inhaler 2 puff (2 puffs Inhalation Given 02/18/19 2059)  albuterol (VENTOLIN HFA) 108 (90 Base) MCG/ACT inhaler 6 puff (6 puffs Inhalation Given 02/18/19 2100)   Initial Impression / Assessment and Plan / ED Course  I have reviewed the triage vital signs and the nursing notes.  Pertinent labs & imaging results that were available during my care of the patient were reviewed by me and considered in my medical decision making (see chart for details).  53 year old male is otherwise well presents for evaluation of asthma exacerbation.  Afebrile, nonseptic, non-ill-appearing.  Out of his home albuterol rescue inhaler.  Denies any chest pain or upper respiratory symptoms.  Lungs with diffuse inspiratory and expiratory wheezing throughout.  Mild tachypnea at 24.  Accessory muscle usage.  Speaks in full sentences without difficulty.  No associated diaphoresis, lightheadedness, dizziness, nausea or vomiting.  Discussed labs, EKG, chest x-ray however patient refusing blood work.  Does agree to chest x-ray, EKG and breathing treatment. Discussed risk vs benefit to include, MI, PE, dissection, infection,  however patient continues to  decline blood work/ IV at this time.  Will give albuterol treatment as well as steroids.  Patient refusing full dose of prednisone.  Agrees to take 30 mg.  Will reevaluate.  Low suspicion for PE, dissection, ACS, Borhaave, pneumothorax, infection, COVID, pulmonary edema, pleural effusion as cause of shortness of breath.  Imaging personally reviewed. EKG without ST/T changes. No STEMI. DG chest without infiltrates, pulmonary edema, pneumothorax, effusion, cardiomegaly.  2045: Patient with continued wheeze however with improvement. States he only took 2 puffs from the albuterol inhaler and half the prednisone. Discussed full albuterol treatment and remaining prednisone dose. Patient refused prednisone. Continues to refuse IV. Will try additional albuterol and Atrovent inhaler. Will also obtain COVID for possible need for nebulizer. Given no IV cannot give Mag at this time. Will reassess.  2200: Significant improvement in wheezing on reevaluation. Speaks in full sentences without difficulty. States breathing at baseline requesting dc home at this time. Will ambulate with pulse ox.  Patient ambulated in ED with O2 saturations maintained 94-98%, no current signs of respiratory distress. Lung exam improved after albuterol treatment. Prednisone given in the ED and pt will be discharged with 5 day burst. Patient states he will not take more than 20mg  prednisone/day at a time. Will dc home with this dose.  The patient has been appropriately medically screened and/or stabilized in the ED. I have low suspicion for any other emergent medical condition which would require further screening, evaluation or treatment in the ED or require inpatient management.  Patient is hemodynamically stable and in no acute distress.  Patient able to ambulate in department prior to ED.  Evaluation does not show acute pathology that would require ongoing or additional emergent interventions while in the emergency department or  further inpatient treatment.  I have discussed the diagnosis with the patient and answered all questions.  Patient has no further complaints prior to discharge.  Patient is comfortable with plan discussed in room and is stable for discharge at this time.  I have discussed strict return precautions for returning to the emergency department.  Patient was encouraged to follow-up with PCP/specialist refer to at discharge.  NASIF BOS was evaluated in Emergency Department on 02/18/2019 for the symptoms described in the history of present illness. He was evaluated in the  context of the global COVID-19 pandemic, which necessitated consideration that the patient might be at risk for infection with the SARS-CoV-2 virus that causes COVID-19. Institutional protocols and algorithms that pertain to the evaluation of patients at risk for COVID-19 are in a state of rapid change based on information released by regulatory bodies including the CDC and federal and state organizations. These policies and algorithms were followed during the patient's care in the ED. Marland Kitchen.    Final Clinical Impressions(s) / ED Diagnoses   Final diagnoses:  Mild intermittent asthma with exacerbation    ED Discharge Orders    None       Delesa Kawa A, PA-C 02/18/19 2224    Raeford RazorKohut, Stephen, MD 02/20/19 1616

## 2019-02-18 NOTE — ED Notes (Signed)
Pt ambulated in the hallway on RA. O2 saturation remained between 94%-98%. Pt denies SHOB, dizziness, or CP.

## 2019-02-18 NOTE — Discharge Instructions (Signed)
Evaluated today for asthma. Given inhalers and I have written a prescription for Prednisone. Take as prescribed. Follow up with PCP.  Return to the ED with any new or worsening symptoms.

## 2019-02-18 NOTE — ED Triage Notes (Signed)
Pt reports having recurrent asthma attacks and does not have inhaler at this time. Pt has audible wheezing on inspiration and expiration.

## 2019-03-06 ENCOUNTER — Ambulatory Visit: Payer: Self-pay | Admitting: Family Medicine

## 2020-02-09 ENCOUNTER — Ambulatory Visit (HOSPITAL_COMMUNITY)
Admission: RE | Admit: 2020-02-09 | Discharge: 2020-02-09 | Disposition: A | Discharge status: Home / Self Care | Payer: Self-pay | Source: Ambulatory Visit | Attending: Internal Medicine | Admitting: Internal Medicine

## 2020-02-09 ENCOUNTER — Ambulatory Visit: Payer: Self-pay | Admitting: Licensed Clinical Social Worker

## 2020-02-09 ENCOUNTER — Other Ambulatory Visit: Payer: Self-pay

## 2020-02-09 ENCOUNTER — Ambulatory Visit (INDEPENDENT_AMBULATORY_CARE_PROVIDER_SITE_OTHER): Payer: Self-pay | Admitting: Family Medicine

## 2020-02-09 VITALS — BP 132/84 | HR 92 | Ht 71.0 in | Wt 271.6 lb

## 2020-02-09 DIAGNOSIS — R55 Syncope and collapse: Secondary | ICD-10-CM | POA: Insufficient documentation

## 2020-02-09 DIAGNOSIS — R413 Other amnesia: Secondary | ICD-10-CM | POA: Insufficient documentation

## 2020-02-09 LAB — POCT HEMOGLOBIN: Hemoglobin: 11.6 g/dL (ref 11–14.6)

## 2020-02-09 MED ORDER — BREO ELLIPTA 100-25 MCG/INH IN AEPB: 1.0000 | INHALATION_SPRAY | Freq: Every day | RESPIRATORY_TRACT | 0 refills | Status: DC

## 2020-02-09 NOTE — Progress Notes (Deleted)
    SUBJECTIVE:   CHIEF COMPLAINT / HPI:   Syncope:   PERTINENT  PMH / PSH: ***  OBJECTIVE:   There were no vitals taken for this visit.  ***  ASSESSMENT/PLAN:   No problem-specific Assessment & Plan notes found for this encounter.     Sandre Kitty, MD [facility]

## 2020-02-09 NOTE — Progress Notes (Signed)
    SUBJECTIVE:   CHIEF COMPLAINT / HPI:   Amnesia: Patient states that he is homeless and lives in his car.  Sometime around Wednesday (2 days ago) patient thinks that he passed out in his car. He remembers is a lady knocking on the window asking if he is okay, then brief episodes of being in an ambulance, but he does not remember anything else besides this.  The next he remembers after that is being in his car again but is uncertain of the timeframe.  Since that time he has had some nausea and mild headache but no other symptoms.  He denies fever, vomiting, chest pain.  He says he has a long history of difficulty breathing due to his asthma.  He also has a history of anemia with recurrent blood transfusions and sleep apnea requiring CPAP.  He states he "feels tired all the time" and lifeless".  He does not smoke cigarettes, drink alcohol, do illicit drugs.  He is uncertain of how much water he had the day that he had this amnesia episode.  I obtained records from the EMS service of [facility] who saw the patient that day.  Per the records, the patient was alert and able to converse with them.  He appeared to be in mild respiratory distress and was treated with albuterol nebulizer inhaler.  The patient declined being taken to the emergency department.   PERTINENT  PMH / PSH: Asthma, anemia, sleep apnea  OBJECTIVE:   BP 132/84   Pulse 92   Ht 5\' 11"  (1.803 m)   Wt 271 lb 9.6 oz (123.2 kg)   SpO2 96%   BMI 37.88 kg/m   General: Alert and oriented.  No acute distress. HEENT: PERRLA, no subconjunctival pallor CV: Regular rate and rhythm.  No murmurs. Pulmonary: Slight end expiratory wheezing in the left lobe. Neuro: Cranial nerves II through XII grossly intact MSK: 5/5 strength bilaterally upper and lower extremities.  Gait normal.  Normal rise from chair Psych: Pleasant affect.  Makes eye contact.  ASSESSMENT/PLAN:   Amnesia Anterograde he needs it resolved, but still unable to  remember the events surrounding his episode on Wednesday.  Based on the work-up done by the EMT, and lack of other symptoms, normal EKG today, normal hemoglobin, it appears most likely that his symptoms are due to hypercapnia from obstructive sleep apnea and inability to use the CPAP.  Patient's issues go beyond OSA though and the main barrier to his improvement in health is his social situation/lack of housing/insurance. -Advised patient not to drive until this issue has resolved. -Provided patient 1 month Breo inhaler.  Will continue to help patient with financial assistance for inhalers -Spoke with Thursday and placed CCM consult to help patient get housing/employment/insurance resources -Gave patient orange card information to fill out.  He has applied in the past but has not renewed it recently. -Follow-up in 1 week     Sammuel Hines, MD [facility]

## 2020-02-09 NOTE — Patient Instructions (Addendum)
It was nice to meet you today,  I believe that episode of amnesia you had on Wednesday was most likely related to your sleep apnea and your lack of ability to use your CPAP machine.  Your carbon dioxide probably built up in your system and made you confused.  Without using a CPAP machine it is likely that this can happen again in the future.  I suggest that you do not drive while this is an issue.  I also have given you an inhaler.  Use this once a day.  I have provided you with the orange card paperwork which you can fill out and bring back at your convenience.  This will help you get with getting medications in the future.  I will reach out to our social worker, Gavin Pound who can hopefully help you with finding solutions for housing and employment and what ever else you need.  Your hemoglobin was 11.4.  Therefore it is unlikely that any of this was due to anemia.  The most important thing to make sure these episodes do not have in the future and to improve your health is to have a stable environment to build upon before we are able to treat you medically.  I would like you to come back for another appointment in the next 1 to 2 weeks.  Please make an appointment on your way out.  Have a great day,  Frederic Jericho, MD

## 2020-02-09 NOTE — Chronic Care Management (AMB) (Signed)
   Social Work  Care Management Consultation  02/09/2020 Name: [name] MRN: [id number] DOB: Jan 07, 1966 [name] is a 54 y.o. year old male who sees [name], MD for primary care. LCSW was consulted by Dr.Olson for information /resources to assistance patient with  Psychosocial Support and insurance options.   Assessment:  Patient has no income and mostly lives in his car. Recommendation: After consultation with provider it is determined that patient may benefit from connecting with CCM Care Guide for community resources.   Intervention: Patient was not interviewed or contacted during this encounter.  LCSW collaborated with Dr. Wyvonne Lenz .  brief assessment, recommendations, and  Relevant information resources discussed with provider. Provider will give patient information packet for the orange card.   Plan:  1. The care management team is available to follow up with the patient after formal CCM referral is placed  2. Please consult with patient prior to making referral  3. No further follow up required by LCSW at this time  Review of patient status, including review of consultants reports, relevant laboratory and other test results, and collaboration with appropriate care team members and the patient's provider was performed as part of comprehensive patient evaluation and provision of chronic care management services.      Jeffery Hines, LCSW Chronic Care Coordination  [name] / [facility]   [phone] 3:58 PM

## 2020-02-09 NOTE — Assessment & Plan Note (Signed)
Anterograde he needs it resolved, but still unable to remember the events surrounding his episode on Wednesday.  Based on the work-up done by the EMT, and lack of other symptoms, normal EKG today, normal hemoglobin, it appears most likely that his symptoms are due to hypercapnia from obstructive sleep apnea and inability to use the CPAP.  Patient's issues go beyond OSA though and the main barrier to his improvement in health is his social situation/lack of housing/insurance. -Advised patient not to drive until this issue has resolved. -Provided patient 1 month Breo inhaler.  Will continue to help patient with financial assistance for inhalers -Spoke with Sammuel Hines and placed CCM consult to help patient get housing/employment/insurance resources -Gave patient orange card information to fill out.  He has applied in the past but has not renewed it recently. -Follow-up in 1 week

## 2020-02-09 NOTE — Progress Notes (Signed)
Asked by Dr. Constance Goltz to initiate/educate patient on new Breo inhaler.  Patient educated on purpose, proper use, and potential adverse effects. Following instruction, patient was able to demonstrate exceptional technique with first dose in office.

## 2020-02-13 ENCOUNTER — Ambulatory Visit: Payer: Self-pay | Admitting: Licensed Clinical Social Worker

## 2020-02-13 DIAGNOSIS — Z139 Encounter for screening, unspecified: Secondary | ICD-10-CM

## 2020-02-13 NOTE — Chronic Care Management (AMB) (Signed)
   Social Work Care Management  Referral Note  02/13/2020 Name: PROCTOR CARRIKER MRN: [id number] DOB: 12/28/1965 BRICK KETCHER is a 54 y.o. year old male who sees Kinnie Feil, MD for primary care.  LCSW was consulted by Dr. Jeannine Kitten to assistance patient with [employer] for housing, employment and insurance options.   Review of patient status, including review of consultants reports, relevant laboratory and other test results, as well as collaboration with appropriate care team members,  and the patient's provider was performed as part of comprehensive patient evaluation and provision of chronic care management services.    Recommendation: After chart review it is determined that no clinical needs are identified for LCSW. Patient's needs are best met by CCM care guides.   Plan:  1. Patient is being referred to the [place] for assistance with community resources for  housing, employment and insurance options. 2.   The Care Guide will contact the Care Management team if clinical needs are identified   Casimer Lanius, [place] / [place]   [phone] 10:34 AM

## 2020-02-14 ENCOUNTER — Telehealth: Payer: Self-pay | Admitting: Family Medicine

## 2020-02-14 ENCOUNTER — Encounter: Payer: Self-pay | Admitting: Family Medicine

## 2020-02-14 NOTE — Telephone Encounter (Signed)
  Community Resource Referral   [facility] 02/14/2020  URGENT Referral 1st Attempt  DOB: [birth year]   AGE: 54 y.o.   GENDER: male   PCP [name], MD.   Called pt regarding Community Resource Referral LMTCB Follow up on: 02/15/2020 Manuela Schwartz  Care Guide . Embedded Care Coordination [facility] Management Samara [email or web address]  [phone]   Notes: Patient is mostly sleeping in his car.  He has no housing and needs assistance with finding employment.  Dr. Constance Goltz gave him the [facility] application during the last office visit.  He is open for help and would like any assistance you can provide with connecting him to resources and support.  Please let me know if any clinical needs are identified.

## 2020-02-15 NOTE — Telephone Encounter (Signed)
°  Community Resource Referral   KNB 02/15/2020   2nd Attempt   DOB: Jan 22, 1966    AGE: 54 y.o.    GENDER: male    PCP [name], MD.   Called pt regarding Community Resource Referral LMTCB Follow up on: 02/16/2020 Manuela Schwartz  Care Guide  Embedded Care Coordination [facility] Management [email or web address]   [phone]

## 2020-02-16 NOTE — Telephone Encounter (Signed)
  Community Resource Referral   KNB 02/16/2020  3rd Attempt  DOB: 01-06-1966   AGE: 54 y.o.   GENDER: male   PCP [name], MD.   Called pt regarding Community Resource Referral lmtcb Called Pt's emergency contact no answer  Closing referral unable to reach patient. KNB Manuela Schwartz  Care Guide . Embedded Care Coordination [facility] Management [email or web address]  [phone]

## 2020-03-06 ENCOUNTER — Ambulatory Visit: Payer: Self-pay | Admitting: Family Medicine

## 2020-03-06 NOTE — Progress Notes (Deleted)
    SUBJECTIVE:   CHIEF COMPLAINT / HPI:   Amnesia/syncope: follow through?   PERTINENT  PMH / PSH: ***  OBJECTIVE:   There were no vitals taken for this visit.  ***  ASSESSMENT/PLAN:   No problem-specific Assessment & Plan notes found for this encounter.     Sandre Kitty, MD [facility]

## 2021-03-14 ENCOUNTER — Ambulatory Visit (INDEPENDENT_AMBULATORY_CARE_PROVIDER_SITE_OTHER): Payer: Self-pay | Admitting: Family Medicine

## 2021-03-14 ENCOUNTER — Other Ambulatory Visit (HOSPITAL_COMMUNITY): Payer: Self-pay

## 2021-03-14 ENCOUNTER — Other Ambulatory Visit: Payer: Self-pay

## 2021-03-14 ENCOUNTER — Encounter: Payer: Self-pay | Admitting: Family Medicine

## 2021-03-14 ENCOUNTER — Telehealth: Payer: Self-pay | Admitting: Family Medicine

## 2021-03-14 VITALS — BP 128/88 | HR 97 | Ht 71.0 in | Wt 260.6 lb

## 2021-03-14 DIAGNOSIS — E611 Iron deficiency: Secondary | ICD-10-CM

## 2021-03-14 DIAGNOSIS — G4733 Obstructive sleep apnea (adult) (pediatric): Secondary | ICD-10-CM

## 2021-03-14 DIAGNOSIS — Z1159 Encounter for screening for other viral diseases: Secondary | ICD-10-CM

## 2021-03-14 DIAGNOSIS — F332 Major depressive disorder, recurrent severe without psychotic features: Secondary | ICD-10-CM

## 2021-03-14 DIAGNOSIS — Z5900 Homelessness unspecified: Secondary | ICD-10-CM | POA: Insufficient documentation

## 2021-03-14 DIAGNOSIS — D508 Other iron deficiency anemias: Secondary | ICD-10-CM

## 2021-03-14 DIAGNOSIS — J4521 Mild intermittent asthma with (acute) exacerbation: Secondary | ICD-10-CM

## 2021-03-14 DIAGNOSIS — E669 Obesity, unspecified: Secondary | ICD-10-CM | POA: Insufficient documentation

## 2021-03-14 DIAGNOSIS — R5383 Other fatigue: Secondary | ICD-10-CM

## 2021-03-14 MED ORDER — ALBUTEROL SULFATE HFA 108 (90 BASE) MCG/ACT IN AERS
1.0000 | INHALATION_SPRAY | Freq: Four times a day (QID) | RESPIRATORY_TRACT | 1 refills | Status: DC | PRN
  Filled 2021-03-14: qty 18, 25d supply, fill #0

## 2021-03-14 MED ORDER — FLUTICASONE FUROATE-VILANTEROL 100-25 MCG/INH IN AEPB: 1.0000 | INHALATION_SPRAY | Freq: Every day | RESPIRATORY_TRACT | 0 refills | Status: DC

## 2021-03-14 NOTE — Assessment & Plan Note (Signed)
Stable off meds. F/U with Psych.

## 2021-03-14 NOTE — Assessment & Plan Note (Signed)
Likely contributing to his fatigue. Unable to use his CPAP since he lives in a car. I will work with CCM team to help with housing. Referral placed in recently, but closed, since they were not able to reach him. He did confirm that his phone number on file is correct. He agreed with new referral.

## 2021-03-14 NOTE — Assessment & Plan Note (Signed)
Need help with housing.

## 2021-03-14 NOTE — Assessment & Plan Note (Addendum)
Anemia panel checked. I advised him that he'll likely receive bills in the mail for all labs done today. He is ok with obtaining labs.  He need colon cancer screening - barriers to screening include no insurance and homelessness (unble to mail cologuard to him) I gave him instruction on filing for orange card. He would be able to get GI referral with orange card. I will readdress this with him.

## 2021-03-14 NOTE — Progress Notes (Signed)
    SUBJECTIVE:   CHIEF COMPLAINT / HPI:   HPI: Anemia: Here for follow-up. Chronically fatigued. He used to get a frequent iron infusion, but none recently. He will like to get his lab done today.  Sleep apnea/Asthma: He is unable to use his sleep machine since he lives in a car. He feels this makes him so exhausted during the day. No new symptoms. He is out of his Virgel Bouquet and he requested samples.  Homeless: he sleeps in the car.  HM: Following up for routine health care. He got one dose of COVID-19 shot about a year or so ago because he was given $100 to do so.   Weight: Unable to exercise due to fatigue.  Depression: Not taking any of his antidepressants. He said he is coping pretty well. No SI/HI.  PERTINENT  PMH / PSH: PMHX reviewed  OBJECTIVE:   BP 128/88   Pulse 97   Ht 5\' 11"  (1.803 m)   Wt 260 lb 9.6 oz (118.2 kg)   SpO2 95%   BMI 36.35 kg/m   Physical Exam Vitals and nursing note reviewed.  Cardiovascular:     Rate and Rhythm: Normal rate and regular rhythm.     Heart sounds: Normal heart sounds. No murmur heard. Pulmonary:     Effort: Pulmonary effort is normal. No respiratory distress.     Breath sounds: Normal breath sounds. No wheezing.  Abdominal:     General: Bowel sounds are normal. There is no distension.     Palpations: Abdomen is soft. There is no mass.     Tenderness: There is no abdominal tenderness.  Musculoskeletal:     Right lower leg: No edema.     Left lower leg: No edema.     ASSESSMENT/PLAN:   Iron deficiency anemia Anemia panel checked. I advised him that he'll likely receive bills in the mail for all labs done today. He is ok with obtaining labs.  He need colon cancer screening - barriers to screening include no insurance and homelessness (unble to mail cologuard to him) I gave him instruction on filing for orange card. He would be able to get GI referral with orange card. I will readdress this with him.  OSA (obstructive sleep  apnea) Likely contributing to his fatigue. Unable to use his CPAP since he lives in a car. I will work with CCM team to help with housing. Referral placed in recently, but closed, since they were not able to reach him. He did confirm that his phone number on file is correct. He agreed with new referral.  Homelessness Need help with housing.  Mild intermittent asthma with acute exacerbation 1 sample of Breo ellipta provided to him today. Continue albuterol as needed.  Obese Weight trending down. Continue lifestyle modification.  Depression Stable off meds. F/U with Psych.    NB: At the end of the visit, he asked for me to check his ear, feels irritated. Unfortunately, this was not done before he left. I called him multiple times to check on this with no response. Message left for him to call back.  , MD [facility]

## 2021-03-14 NOTE — Assessment & Plan Note (Signed)
Weight trending down. Continue lifestyle modification.

## 2021-03-14 NOTE — Telephone Encounter (Signed)
HIPPA compliant message left.   Please advise patient to return soon for ear irritation evaluation. We weren't able to complete this during his last visit. Please, advise ED visit if this worsens and we don't have an opening. Thanks.

## 2021-03-14 NOTE — Assessment & Plan Note (Signed)
1 sample of Breo ellipta provided to him today. Continue albuterol as needed.

## 2021-03-14 NOTE — Progress Notes (Signed)
Asked by Dr. Lum Babe to provide a sample. Also provided [employer] patient assistance paperwork. Medication Samples have been provided to the patient.  Drug name: BREO       Strength: 100-25        Qty: 1 BOX  LOT: [id number]  Exp.Date: 06/06/2022  Dosing instructions: INHALE 1 PUFF ONCE DAILY  The patient has been instructed regarding the correct time, dose, and frequency of taking this medication, including desired effects and most common side effects.   [name] 11:07 AM 03/14/2021

## 2021-03-15 ENCOUNTER — Telehealth: Payer: Self-pay | Admitting: Family Medicine

## 2021-03-15 ENCOUNTER — Other Ambulatory Visit (HOSPITAL_COMMUNITY): Payer: Self-pay

## 2021-03-15 LAB — BASIC METABOLIC PANEL
BUN/Creatinine Ratio: 17 (ref 9–20)
BUN: 18 mg/dL (ref 6–24)
CO2: 25 mmol/L (ref 20–29)
Calcium: 9.4 mg/dL (ref 8.7–10.2)
Chloride: 102 mmol/L (ref 96–106)
Creatinine, Ser: 1.05 mg/dL (ref 0.76–1.27)
Glucose: 98 mg/dL (ref 65–99)
Potassium: 5.5 mmol/L — ABNORMAL HIGH (ref 3.5–5.2)
Sodium: 139 mmol/L (ref 134–144)
eGFR: 84 mL/min/{1.73_m2} (ref >59)

## 2021-03-15 LAB — LIPID PANEL
Chol/HDL Ratio: 3.9 ratio (ref 0.0–5.0)
Cholesterol, Total: 171 mg/dL (ref 100–199)
HDL: 44 mg/dL (ref >39)
LDL Chol Calc (NIH): 116 mg/dL — ABNORMAL HIGH (ref 0–99)
Triglycerides: 55 mg/dL (ref 0–149)
VLDL Cholesterol Cal: 11 mg/dL (ref 5–40)

## 2021-03-15 LAB — ANEMIA PROFILE B
Basophils Absolute: 0 10*3/uL (ref 0.0–0.2)
Basos: 1 %
EOS (ABSOLUTE): 0.4 10*3/uL (ref 0.0–0.4)
Eos: 4 %
Ferritin: 15 ng/mL — ABNORMAL LOW (ref 30–400)
Folate: 7.4 ng/mL (ref >3.0)
Hematocrit: 35.9 % — ABNORMAL LOW (ref 37.5–51.0)
Hemoglobin: 10.7 g/dL — ABNORMAL LOW (ref 13.0–17.7)
Immature Grans (Abs): 0.1 10*3/uL (ref 0.0–0.1)
Immature Granulocytes: 1 %
Iron Saturation: 9 % — CL (ref 15–55)
Iron: 30 ug/dL — ABNORMAL LOW (ref 38–169)
Lymphocytes Absolute: 1 10*3/uL (ref 0.7–3.1)
Lymphs: 11 %
MCH: 22.1 pg — ABNORMAL LOW (ref 26.6–33.0)
MCHC: 29.8 g/dL — ABNORMAL LOW (ref 31.5–35.7)
MCV: 74 fL — ABNORMAL LOW (ref 79–97)
Monocytes Absolute: 0.8 10*3/uL (ref 0.1–0.9)
Monocytes: 9 %
Neutrophils Absolute: 6.4 10*3/uL (ref 1.4–7.0)
Neutrophils: 74 %
Platelets: 450 10*3/uL (ref 150–450)
RBC: 4.85 x10E6/uL (ref 4.14–5.80)
RDW: 15.1 % (ref 11.6–15.4)
Retic Ct Pct: 1.4 % (ref 0.6–2.6)
Total Iron Binding Capacity: 349 ug/dL (ref 250–450)
UIBC: 319 ug/dL (ref 111–343)
Vitamin B-12: 537 pg/mL (ref 232–1245)
WBC: 8.6 10*3/uL (ref 3.4–10.8)

## 2021-03-15 LAB — HEPATITIS C ANTIBODY: Hep C Virus Ab: 0.2 s/co ratio (ref 0.0–0.9)

## 2021-03-15 MED ORDER — FERROUS SULFATE 324 (65 FE) MG PO TBEC
1.0000 | DELAYED_RELEASE_TABLET | Freq: Every day | ORAL | 2 refills | Status: DC
  Filled 2021-03-15: qty 90, 90d supply, fill #0

## 2021-03-15 NOTE — Telephone Encounter (Addendum)
I have tried to contact patient with test result with no success. He did confirm that he would be reachable on the mobile phone number on file for him.  HIPAA compliant callback message left.  Note: +Iron deficiency anemia. Hemoglobin is not critically low. He will benefit from iron infusion vs oral supplement. Will send iron supplement to his pharmacy and continue to attempt to reach him.  His Potassium level is also mildly elevated. This need to be rechecked.

## 2021-03-17 ENCOUNTER — Telehealth: Payer: Self-pay | Admitting: Family Medicine

## 2021-03-17 ENCOUNTER — Other Ambulatory Visit: Payer: Self-pay | Admitting: Family Medicine

## 2021-03-17 ENCOUNTER — Other Ambulatory Visit (HOSPITAL_COMMUNITY): Payer: Self-pay

## 2021-03-17 DIAGNOSIS — D509 Iron deficiency anemia, unspecified: Secondary | ICD-10-CM

## 2021-03-17 DIAGNOSIS — Z1211 Encounter for screening for malignant neoplasm of colon: Secondary | ICD-10-CM

## 2021-03-17 NOTE — Telephone Encounter (Signed)
I was able to reach patient this morning.  He still wants me to check his ears. I offered an RN appointment so either I or the preceptor can take a look into his ears. He stated that he will call back for that.  I discussed mildly low potassium with him and recommended recheck this week. Appointment offered, but he deferred it. He will call back to schedule.  Low Hemoglobin and ferritin discussed. Per the patient, oral supplements never worked for him.  He used to receive iron infusion with [facility] and he requires a referral. I discussed getting iron infusion in the [place] community and he prefers that.  As discussed with him, I will reach out to our referral specialist regarding setting him up for iron infusion with [facility]. If this does not work, then will refer to [facility]. He agreed with the plan. No change in symptoms from his baseline.

## 2021-03-19 ENCOUNTER — Telehealth: Payer: Self-pay | Admitting: *Deleted

## 2021-03-19 NOTE — Chronic Care Management (AMB) (Signed)
  Care Management   Outreach Note  03/19/2021 Name: [name] MRN: [id number] DOB: Oct 31, 1965  Referred by: [name], MD Reason for referral : Care Coordination (Initial outreach to schedule referral with Licensed Clinical SW )   An unsuccessful telephone outreach was attempted today. The patient was referred to the case management team for assistance with care management and care coordination.   Follow Up Plan: A HIPAA compliant phone message was left for the patient providing contact information and requesting a return call. The care management team will reach out to the patient again over the next 7 days.  If patient returns call to provider office, please advise to call Embedded Care Management Care Guide Gwenevere Ghazi at [phone].  Gwenevere Ghazi  Care Guide, Embedded Care Coordination [facility] Management

## 2021-03-21 NOTE — Chronic Care Management (AMB) (Signed)
  Care Management   Outreach Note  03/21/2021 Name: [name] MRN: [id number] DOB: [birth year]  Referred by: [name], MD Reason for referral : Care Coordination (Initial outreach to schedule referral with Licensed Clinical SW )   A second unsuccessful telephone outreach was attempted today. The patient was referred to the case management team for assistance with care management and care coordination.   Follow Up Plan: A HIPAA compliant phone message was left for the patient providing contact information and requesting a return call. The care management team will reach out to the patient again over the next 7 days.  If patient returns call to provider office, please advise to call Embedded Care Management Care Guide Gwenevere Ghazi at [phone].  Gwenevere Ghazi  Care Guide, Embedded Care Coordination [facility] Management

## 2021-03-24 ENCOUNTER — Other Ambulatory Visit (HOSPITAL_COMMUNITY): Payer: Self-pay

## 2021-03-25 ENCOUNTER — Other Ambulatory Visit (HOSPITAL_COMMUNITY): Payer: Self-pay | Admitting: *Deleted

## 2021-03-26 ENCOUNTER — Inpatient Hospital Stay (HOSPITAL_COMMUNITY): Admission: RE | Admit: 2021-03-26 | Payer: Self-pay | Source: Ambulatory Visit

## 2021-03-26 ENCOUNTER — Telehealth: Payer: Self-pay | Admitting: Hematology and Oncology

## 2021-03-26 ENCOUNTER — Encounter (HOSPITAL_COMMUNITY): Payer: Self-pay

## 2021-03-26 NOTE — Chronic Care Management (AMB) (Signed)
  Care Management   Outreach Note  03/26/2021 Name: [name] MRN: [id number] DOB: 18-Jan-1966  Referred by: [name], MD Reason for referral : Care Coordination (Initial outreach to schedule referral with Licensed Clinical SW )   Third unsuccessful telephone outreach was attempted today. The patient was referred to the case management team for assistance with care management and care coordination. The patient's primary care provider has been notified of our unsuccessful attempts to make or maintain contact with the patient. The care management team is pleased to engage with this patient at any time in the future should he/she be interested in assistance from the care management team.   Follow Up Plan: We have been unable to make contact with the patient. The care management team is available to follow up with the patient after provider conversation with the patient regarding recommendation for care management engagement and subsequent re-referral to the care management team.  A HIPAA compliant phone message was left for the patient providing contact information and requesting a return call. If patient returns call to provider office, please advise to call Embedded Care Management Care Guide Gwenevere Ghazi at [phone].  Gwenevere Ghazi  Care Guide, Embedded Care Coordination [facility] Management  Direct Dial: [phone]

## 2021-03-26 NOTE — Telephone Encounter (Signed)
Scheduled appt per 7/11 referral. Pt aware.

## 2021-03-27 NOTE — Progress Notes (Signed)
Received notification from [employer] regarding approval for BREO ELLIPTA 100MCG/25MCG. Patient assistance approved from 03/26/21 to 03/25/22.  MEDICATION WILL SHIP TO OFFICE  Phone: [phone]

## 2021-03-31 ENCOUNTER — Inpatient Hospital Stay: Payer: Self-pay | Attending: Hematology and Oncology | Admitting: Hematology and Oncology

## 2021-03-31 ENCOUNTER — Inpatient Hospital Stay: Payer: Self-pay

## 2021-03-31 NOTE — Progress Notes (Signed)
No show

## 2021-04-02 ENCOUNTER — Telehealth: Payer: Self-pay

## 2021-04-02 ENCOUNTER — Encounter (HOSPITAL_COMMUNITY): Payer: Self-pay

## 2021-04-02 NOTE — Telephone Encounter (Signed)
Left voicemail letting pt know medication is ready for pickup (breo).  Located in med room on counter. Labeled and ready.

## 2021-04-29 ENCOUNTER — Telehealth: Payer: Self-pay | Admitting: *Deleted

## 2021-04-29 NOTE — Telephone Encounter (Signed)
Medication given to patient

## 2021-04-29 NOTE — Chronic Care Management (AMB) (Signed)
  Care Management   Note  04/29/2021 Name: [name] MRN: [id number] DOB: 1966-06-16  [name] is a 55 y.o. year old male who is a primary care patient of [name], MD. I reached out to [name] by phone today in response to a referral sent by [name]'s PCP, [name], MD.  [name] was given information about care management services today including:  Care management services include personalized support from designated clinical staff supervised by his physician, including individualized plan of care and coordination with other care providers 24/7 contact phone numbers for assistance for urgent and routine care needs. The patient may stop care management services at any time by phone call to the office staff.  Patient agreed to services and verbal consent obtained.   Follow up plan: Telephone appointment with care management team member scheduled for:05/06/21  [facility] Guide, Embedded Care Coordination [facility]  Care Management  Direct Dial: [phone]

## 2021-05-06 ENCOUNTER — Ambulatory Visit: Payer: Self-pay | Admitting: Licensed Clinical Social Worker

## 2021-05-06 DIAGNOSIS — Z139 Encounter for screening, unspecified: Secondary | ICD-10-CM

## 2021-05-06 NOTE — Progress Notes (Signed)
[facility] [phone]   [phone]  INITIAL CONSULT NOTE  Patient Care Team: [name], MD as PCP - General (Family Medicine) Soundra Pilon, LCSW as Social Worker (Licensed Clinical Social Worker)  Hematological/Oncological History 1) 03/14/2021: TIBC 349, Iron 30 (L), Iron saturation 9% (L), Ferritin 15 (L), Vitamin B12 537, Folate 7.4, WBC 8.6, Hgb 10.7 (L), MCV 74 (L), Plt 450.   2) 05/07/2021: Establish care with Georga Kaufmann PA-C  CHIEF COMPLAINTS/PURPOSE OF CONSULTATION:  Iron deficiency anemia  HISTORY OF PRESENTING ILLNESS:  Belia Heman Tabares 55 y.o. male with medical history significant for ADD, constipation, depression, hiatal hernia and sleep apnea. Patient is unaccompanied for this visit.   On review of the previous records, Mr. Hernandez was under the care of GI and hematology at [facility] from 2013-2017 for iron deficiency anemia likely related to significant gastritis and esophagitis. He received IV iron dextran infusions periodically.   On exam today, Mr. Markson reports persistent fatigue that does interfere with his ability to complete his ADLs. He is currently homeless and working with  his PCP to arrange for housing. He notes having a unhealthy diet and has weight gain. He denies any nausea, vomiting or abdominal pain. He reports having constipation when he took oral iron pills in the past. His bowel movements are more regular at this time. He denies easy bruising or signs of bleeding. He has shortness of breath with exertion but none at rest. He denies fevers, chills, night sweats, chest pain, cough, dizziness, syncope, rash or neuropathy. He has no other complaints. Rest of the 10 point ROS is below.   MEDICAL HISTORY:  Past Medical History:  Diagnosis Date   ADD (attention deficit disorder)    ADD (attention deficit disorder)    Anemia, iron deficiency    Asthma    Blood transfusion     Constipation    Depression    Depression    Exertional dyspnea    secondary to anemia   Hiatal hernia    Sleep apnea 01/2018    SURGICAL HISTORY: No past surgical history on file.  SOCIAL HISTORY: Social History   Socioeconomic History   Marital status: Single    Spouse name: Not on file   Number of children: 0   Years of education: Not on file   Highest education level: Not on file  Occupational History   Occupation: student-Business  Tobacco Use   Smoking status: Never   Smokeless tobacco: Never  Substance and Sexual Activity   Alcohol use: No   Drug use: No   Sexual activity: Not Currently  Other Topics Concern   Not on file  Social History Narrative   Lives alone. Never married, no children.  Has two sister who live in [place].  Student at work study.  [employer].  Working toward Chief Technology Officer.  Previously worked in Geophysicist/field seismologist.  Was also previously homeless due to job loss and psychiatric exacerbation.   Social Determinants of Health   Financial Resource Strain: High Risk   Difficulty of Paying Living Expenses: Hard  Food Insecurity: Food Insecurity Present   Worried About Running Out of Food in the Last Year: Sometimes true   Ran Out of Food in the Last Year: Sometimes true  Transportation Needs: Unmet Transportation Needs   Lack of Transportation (Medical): Yes   Lack of Transportation (Non-Medical): Yes  Physical Activity: Not on file  Stress: Not on file  Social Connections: Not on file  Intimate Partner Violence: Not on file    FAMILY HISTORY: Family History  Problem Relation Age of Onset   Heart disease Mother    Heart disease Father    Diabetes Paternal Grandfather    Cancer Neg Hx    Stroke Neg Hx     ALLERGIES:  has No Known Allergies.  MEDICATIONS:  Current Outpatient Medications  Medication Sig Dispense Refill   albuterol (VENTOLIN HFA) 108 (90 Base) MCG/ACT inhaler Inhale 1-2 puffs into the lungs every 6  (six) hours as needed for wheezing or shortness of breath. 18 g 1   fluticasone furoate-vilanterol (BREO ELLIPTA) 100-25 MCG/INH AEPB Inhale 1 puff into the lungs daily. 1 each 0   No current facility-administered medications for this visit.    REVIEW OF SYSTEMS:   Constitutional: ( - ) fevers, ( - )  chills , ( - ) night sweats Eyes: ( - ) blurriness of vision, ( - ) double vision, ( - ) watery eyes Ears, nose, mouth, throat, and face: ( - ) mucositis, ( - ) sore throat Respiratory: ( - ) cough, ( - ) dyspnea, ( - ) wheezes Cardiovascular: ( - ) palpitation, ( - ) chest discomfort, ( - ) lower extremity swelling Gastrointestinal:  ( - ) nausea, ( - ) heartburn, ( - ) change in bowel habits Skin: ( - ) abnormal skin rashes Lymphatics: ( - ) new lymphadenopathy, ( - ) easy bruising Neurological: ( - ) numbness, ( - ) tingling, ( - ) new weaknesses Behavioral/Psych: ( - ) mood change, ( - ) new changes  All other systems were reviewed with the patient and are negative.  PHYSICAL EXAMINATION: ECOG PERFORMANCE STATUS: 1 - Symptomatic but completely ambulatory  Vitals:   05/07/21 1100  BP: 135/82  Pulse: (!) 102  Resp: 18  Temp: 99.3 F (37.4 C)  SpO2: 100%   Filed Weights   05/07/21 1100  Weight: 280 lb (127 kg)    GENERAL: well appearing male in NAD, obese SKIN: skin color, texture, turgor are normal, no rashes or significant lesions EYES: conjunctiva are pink and non-injected, sclera clear OROPHARYNX: no exudate, no erythema; lips, buccal mucosa, and tongue normal  NECK: supple, non-tender LYMPH:  no palpable lymphadenopathy in the cervical, axillary or supraclavicular lymph nodes.  LUNGS: clear to auscultation and percussion with normal breathing effort HEART: regular rate & rhythm and no murmurs and no lower extremity edema ABDOMEN: soft, non-tender, non-distended, normal bowel sounds Musculoskeletal: no cyanosis of digits and no clubbing  PSYCH: alert & oriented x 3,  fluent speech NEURO: no focal motor/sensory deficits  LABORATORY DATA:  I have reviewed the data as listed CBC Latest Ref Rng & Units 05/07/2021 03/14/2021 02/09/2020  WBC 4.0 - 10.5 K/uL 9.6 8.6 -  Hemoglobin 13.0 - 17.0 g/dL 7.5(L) 10.7(L) 11.6  Hematocrit 39.0 - 52.0 % 26.1(L) 35.9(L) -  Platelets 150 - 400 K/uL 461(H) 450 -    CMP Latest Ref Rng & Units 05/07/2021 03/14/2021 09/14/2017  Glucose 70 - 99 mg/dL 98 98 99  BUN 6 - 20 mg/dL 45(G) 18 14  Creatinine 0.61 - 1.24 mg/dL 2.56 3.89 3.73  Sodium 135 - 145 mmol/L 140 139 139  Potassium 3.5 - 5.1 mmol/L 4.6 5.5(H) 4.5  Chloride 98 - 111 mmol/L 106 102 104  CO2 22 - 32 mmol/L 25 25 22   Calcium 8.9 - 10.3 mg/dL 9.2 9.4 9.3  Total Protein 6.5 -  8.1 g/dL 7.1 - 7.0  Total Bilirubin 0.3 - 1.2 mg/dL <0.2(D) - <7.4  Alkaline Phos 38 - 126 U/L 81 - 87  AST 15 - 41 U/L 33 - 16  ALT 0 - 44 U/L 60(H) - 12    ASSESSMENT & PLAN TAVORIS BRISK is a 55 y.o. male who presents to the clinic for evaluation for iron deficiency anemia. Patient is not taking oral iron supplementation. He denies easy bruising or signs of bleeding. Mr. Santucci most recent EGD and colonoscopy was from August 2013 so I will place a referral to gastroenterology to establish care and evaluate for GI bleed versus malabsorption.Patient will proceed with labs today to check CBC, CMP, Ferritin, TIBC and Iron, Retic Panel.  #Iron deficiency anemia: --Etiology unknown, he denies easy bruising or signs of bleeding. Prior cause was secondary to significant gastritis and esophagitis. Patient is not under the care of GI so will place a referral today.  --Labs today to check  CBC, CMP, Ferritin, TIBC and Iron, Retic Panel. --Recommend IV venofer x 5 doses. Patient reports constipation with oral iron supplements.  --RTC 4 weeks after completion of IV venofer infusions.   Orders Placed This Encounter  Procedures   CBC with Differential (Cancer Center Only)    Standing Status:   Future     Number of Occurrences:   1    Standing Expiration Date:   05/06/2022   CMP (Cancer Center only)    Standing Status:   Future    Number of Occurrences:   1    Standing Expiration Date:   05/06/2022   Ferritin    Standing Status:   Future    Number of Occurrences:   1    Standing Expiration Date:   05/06/2022   Iron and TIBC    Standing Status:   Future    Number of Occurrences:   1    Standing Expiration Date:   05/06/2022   Retic Panel    Standing Status:   Future    Number of Occurrences:   1    Standing Expiration Date:   05/06/2022   Sample to Blood Bank    Standing Status:   Future    Number of Occurrences:   1    Standing Expiration Date:   05/07/2022    All questions were answered. The patient knows to call the clinic with any problems, questions or concerns.  I have spent a total of 60 minutes minutes of face-to-face and non-face-to-face time, preparing to see the patient, obtaining and/or reviewing separately obtained history, performing a medically appropriate examination, counseling and educating the patient, ordering medications/tests, referring with other health care professionals, documenting clinical information in the electronic health record, and care coordination.   Georga Kaufmann, PA-C Department of Hematology/Oncology [facility] at [facility] Phone: [phone]

## 2021-05-06 NOTE — Patient Instructions (Signed)
Licensed Clinical Social Worker Visit Information  Goals we discussed today:   Goals Addressed             This Visit's Progress    Find Help in My Community       Timeframe:  Long-Range Goal Priority:  High Start Date:  05/06/2021                           Expected End Date:                        Patient Goals/Self-Care Activities: Over the next 30 days Call [facility] [phone]  I have placed a referral to [facility]  Call [facility] Ending Homelessness: Coordinated Entry [phone]     Why is this important?   Knowing how and where to find help for yourself or family in your neighborhood and community is an important skill.      Mr. [name] was given information about Care Management services today including:  Care Management services include personalized support from designated clinical staff supervised by his physician, including individualized plan of care and coordination with other care providers 24/7 contact phone numbers for assistance for urgent and routine care needs. The patient may stop Care Management services at any time by phone call to the office staff. Patient agreed to services and verbal consent obtained.   Patient verbalizes understanding of instructions provided today and agrees to view in MyChart.   Follow up plan: SW will follow up with patient by phone over the next 30 day if no call received from Mr. [name], [place] Care Management & Coordination  [phone]

## 2021-05-06 NOTE — Chronic Care Management (AMB) (Signed)
Care Management Clinical Social Work Note  05/06/2021 Name: [name] MRN: [id number] DOB: 02-19-1966  [name] is a 55 y.o. year old male who is a primary care patient of [name], MD.  The Care Management team was consulted for assistance with coordination needs.Training and development officer with patient by telephone for initial visit in response to provider referral for social work chronic care management and care coordination services  Consent to Services:  [name] was given information about Care Management services today including:  Care Management services includes personalized support from designated clinical staff supervised by his physician, including individualized plan of care and coordination with other care providers 24/7 contact phone numbers for assistance for urgent and routine care needs. The patient may stop case management services at any time by phone call to the office staff.  Patient agreed to services and consent obtained.   Assessment: .   Patient has been homeless for the past year. Currently lives in car. . See Care Plan below for Rice and patient self-care actives. Recent life changes or stressors: not able to meet medical needs due to being homeless Recommendation: Patient may benefit from, and is in agreement for LCSW to make referral to vocational rehab.   Follow up Plan: Patient would like continued follow-up from CCM LCSW .  per patient's request will follow up in 30 days if he has not called me.  Will call office if needed prior to next encounter.     Review of patient past medical history, allergies, medications, and health status, including review of relevant consultants reports was performed today as part of a comprehensive evaluation and provision of chronic care management and care coordination services.  Jeffery (Social Determinants of Health) assessments and Rice performed:  [name]    Flowsheet  Row Most Recent Value  [name]   Financial Strain Rice [facility] Referral  Transportation [facility]        Advanced Directives Status: Not addressed in this encounter.  Care Plan  No Known Allergies  Outpatient Encounter Medications as of 05/06/2021  Medication Sig   albuterol (VENTOLIN HFA) 108 (90 Base) MCG/ACT inhaler Inhale 1-2 puffs into the lungs every 6 (six) hours as needed for wheezing or shortness of breath.   buPROPion (WELLBUTRIN XL) 150 MG 24 hr tablet TAKE 1 TABLET BY MOUTH DAILY.   ferrous sulfate 324 (65 Fe) MG TBEC Take 1 tablet (325 mg total) by mouth daily.   fluticasone furoate-vilanterol (BREO ELLIPTA) 100-25 MCG/INH AEPB Inhale 1 puff into the lungs daily.   sertraline (ZOLOFT) 50 MG tablet TAKE 1 TABLET BY MOUTH DAILY.   No facility-administered encounter medications on file as of 05/06/2021.    Patient Active Problem List   Diagnosis Date Noted   Homelessness 03/14/2021   Obese 03/14/2021   Amnesia 02/09/2020   Hypogonadism in male 09/14/2017   OSA (obstructive sleep apnea) 05/29/2014   Iron deficiency anemia 12/22/2013   Fatigue 07/07/2013   Adult ADHD 07/07/2013   Dyspnea 03/01/2012   Depression 01/07/2012   GERD (gastroesophageal reflux disease) 01/07/2012   Mild intermittent asthma with acute exacerbation 01/07/2012    Conditions to be addressed/monitored: Homelessness; Financial constraints Care Plan : General Social Work (Adult)  Updates made by Jeffery Pilon, LCSW since 05/06/2021 12:00 AM   Problem: Barriers to Treatment needs community support    Goal: Connect with community resources for support   Start Date: 05/06/2021  This Visit's  Progress: On track  Priority: High  Current barriers:    Financial constraints related to no income, Limited social support, and Housing barriers Clinical Goals: Patient will work with agencies discussed today to address needs related to barriers to  care Clinical Rice:  Inter-disciplinary care team collaboration (see longitudinal plan of care) Assessment of needs, barriers , agencies contacted, as well as how impacting Review various resources, discussed options and provided patient information about  Department of Engineer, site ( food stamps ), [employer] , Various insurance options ( Orange Card, Haematologist ), Housing resources (Partnership ending Homeless), and Referral for [employer] (to vocational rehabilitation ) Has completed [employer] application Now receiving foodstamps Solution-Focused Strategies, Active listening / Reflection utilized , and Problem Solving /Task Center  Patient Goals/Self-Care Activities: Over the next 30 days Call [employer] [phone]  I have placed a referral to [facility] Ending Homelessness: Coordinated Entry [phone]       Jeffery Hines, LCSW Care Management & Coordination  [facility] / [facility]   [phone] 4:21 PM

## 2021-05-07 ENCOUNTER — Other Ambulatory Visit: Payer: Self-pay

## 2021-05-07 ENCOUNTER — Inpatient Hospital Stay (HOSPITAL_BASED_OUTPATIENT_CLINIC_OR_DEPARTMENT_OTHER): Payer: Self-pay | Admitting: Physician Assistant

## 2021-05-07 ENCOUNTER — Inpatient Hospital Stay: Payer: Self-pay | Attending: Hematology and Oncology

## 2021-05-07 ENCOUNTER — Encounter: Payer: Self-pay | Admitting: Physician Assistant

## 2021-05-07 VITALS — BP 135/82 | HR 102 | Temp 99.3°F | Resp 18 | Wt 280.0 lb

## 2021-05-07 DIAGNOSIS — D509 Iron deficiency anemia, unspecified: Secondary | ICD-10-CM | POA: Insufficient documentation

## 2021-05-07 DIAGNOSIS — D500 Iron deficiency anemia secondary to blood loss (chronic): Secondary | ICD-10-CM

## 2021-05-07 LAB — CBC WITH DIFFERENTIAL (CANCER CENTER ONLY)
Abs Immature Granulocytes: 0.07 10*3/uL (ref 0.00–0.07)
Basophils Absolute: 0.1 10*3/uL (ref 0.0–0.1)
Basophils Relative: 1 %
Eosinophils Absolute: 0.4 10*3/uL (ref 0.0–0.5)
Eosinophils Relative: 4 %
HCT: 26.1 % — ABNORMAL LOW (ref 39.0–52.0)
Hemoglobin: 7.5 g/dL — ABNORMAL LOW (ref 13.0–17.0)
Immature Granulocytes: 1 %
Lymphocytes Relative: 14 %
Lymphs Abs: 1.3 10*3/uL (ref 0.7–4.0)
MCH: 20.2 pg — ABNORMAL LOW (ref 26.0–34.0)
MCHC: 28.7 g/dL — ABNORMAL LOW (ref 30.0–36.0)
MCV: 70.2 fL — ABNORMAL LOW (ref 80.0–100.0)
Monocytes Absolute: 0.9 10*3/uL (ref 0.1–1.0)
Monocytes Relative: 10 %
Neutro Abs: 6.9 10*3/uL (ref 1.7–7.7)
Neutrophils Relative %: 70 %
Platelet Count: 461 10*3/uL — ABNORMAL HIGH (ref 150–400)
RBC: 3.72 MIL/uL — ABNORMAL LOW (ref 4.22–5.81)
RDW: 15.9 % — ABNORMAL HIGH (ref 11.5–15.5)
WBC Count: 9.6 10*3/uL (ref 4.0–10.5)
nRBC: 0 % (ref 0.0–0.2)

## 2021-05-07 LAB — CMP (CANCER CENTER ONLY)
ALT: 60 U/L — ABNORMAL HIGH (ref 0–44)
AST: 33 U/L (ref 15–41)
Albumin: 3.5 g/dL (ref 3.5–5.0)
Alkaline Phosphatase: 81 U/L (ref 38–126)
Anion gap: 9 (ref 5–15)
BUN: 24 mg/dL — ABNORMAL HIGH (ref 6–20)
CO2: 25 mmol/L (ref 22–32)
Calcium: 9.2 mg/dL (ref 8.9–10.3)
Chloride: 106 mmol/L (ref 98–111)
Creatinine: 1.11 mg/dL (ref 0.61–1.24)
GFR, Estimated: >60 mL/min (ref >60)
Glucose, Bld: 98 mg/dL (ref 70–99)
Potassium: 4.6 mmol/L (ref 3.5–5.1)
Sodium: 140 mmol/L (ref 135–145)
Total Bilirubin: <0.2 mg/dL — ABNORMAL LOW (ref 0.3–1.2)
Total Protein: 7.1 g/dL (ref 6.5–8.1)

## 2021-05-07 LAB — RETIC PANEL
Immature Retic Fract: 29.1 % — ABNORMAL HIGH (ref 2.3–15.9)
RBC.: 3.6 MIL/uL — ABNORMAL LOW (ref 4.22–5.81)
Retic Count, Absolute: 70.6 10*3/uL (ref 19.0–186.0)
Retic Ct Pct: 2 % (ref 0.4–3.1)
Reticulocyte Hemoglobin: 16.7 pg — ABNORMAL LOW (ref >27.9)

## 2021-05-07 LAB — SAMPLE TO BLOOD BANK

## 2021-05-08 ENCOUNTER — Encounter: Payer: Self-pay | Admitting: Physician Assistant

## 2021-05-08 LAB — IRON AND TIBC
Iron: 19 ug/dL — ABNORMAL LOW (ref 42–163)
Saturation Ratios: 4 % — ABNORMAL LOW (ref 20–55)
TIBC: 506 ug/dL — ABNORMAL HIGH (ref 202–409)
UIBC: 488 ug/dL — ABNORMAL HIGH (ref 117–376)

## 2021-05-08 LAB — FERRITIN: Ferritin: <4 ng/mL — ABNORMAL LOW (ref 24–336)

## 2021-05-09 ENCOUNTER — Telehealth: Payer: Self-pay

## 2021-05-09 NOTE — Telephone Encounter (Signed)
Pt has been referred to GI for an evaluation.  I have called [facility] at [phone] and scheduled the pt for the first urgent appt they have available. He has been scheduled for 06/02/21 at 9am.  Referral, progress note and lab results have also been faxed to GI at [phone].  I have also called and LM for the pt with this information and the address to [facility] as well.

## 2021-05-14 ENCOUNTER — Telehealth: Payer: Self-pay

## 2021-05-14 NOTE — Telephone Encounter (Signed)
Conversation: Change appointment date (Newest Message First) May 14, 2021 Me to Arvie, Bartholomew     9:35 AM [name],   Dr. Leonides Schanz has requested we move your appointment to 05/16/21 rather than keeping your current appointment on 06/02/21. I have called you phone and left a voice message to inquire if you would be able to be seen sooner on 05/16/21. Please let me know if this would be feasible for you.   Thank you, [facility]  This MyChart message has not been read.

## 2021-05-14 NOTE — Telephone Encounter (Signed)
SECOND ATTEMPT:  Called pt and LVM requesting returned call. 

## 2021-05-14 NOTE — Telephone Encounter (Signed)
-----   Message from Imogene Burn, MD sent at 05/13/2021  6:26 PM EDT ----- Hi Aliani Caccavale,  Could you help see if this patient is willing to come in for a clinic visit with me this Friday? Okay to [facility] my clinic for this patient.  Thanks, Alan Ripper ----- Message ----- From: Jaci Standard, MD Sent: 05/09/2021   9:55 AM EDT To: Imogene Burn, MD  Janit Pagan are scheduled to see this patient on 06/02/2021, however in the last month he has had a Hgb drop of 3. Can you see him sooner?  Vonna Kotyk

## 2021-05-14 NOTE — Telephone Encounter (Signed)
Per Dr. Derek Mound request, called pt to inquire if he could reschedule 06/02/21 appt to a sooner date, 05/16/21. LVM requesting returned call. Will continue efforts to reach pt for rescheduling purposes.

## 2021-05-15 ENCOUNTER — Other Ambulatory Visit: Payer: Self-pay | Admitting: Physician Assistant

## 2021-05-15 ENCOUNTER — Telehealth: Payer: Self-pay | Admitting: Physician Assistant

## 2021-05-15 DIAGNOSIS — D500 Iron deficiency anemia secondary to blood loss (chronic): Secondary | ICD-10-CM

## 2021-05-15 NOTE — Telephone Encounter (Signed)
Left a detailed message to return call asking if he would like to come sooner, he is scheduled for an infusion on 05-16-2021 also, asked via the voicemail if he would like top come before or after the infusion.

## 2021-05-15 NOTE — Telephone Encounter (Signed)
Called patient regarding 09/09 los, patient has been called and voicemail was left.

## 2021-05-16 ENCOUNTER — Inpatient Hospital Stay: Payer: Self-pay

## 2021-05-16 ENCOUNTER — Telehealth: Payer: Self-pay

## 2021-05-16 ENCOUNTER — Inpatient Hospital Stay: Payer: Self-pay | Attending: Hematology and Oncology

## 2021-05-16 DIAGNOSIS — D509 Iron deficiency anemia, unspecified: Secondary | ICD-10-CM | POA: Insufficient documentation

## 2021-05-16 NOTE — Telephone Encounter (Signed)
Patient never returned call to move patients appointment up

## 2021-05-16 NOTE — Telephone Encounter (Signed)
I have called the pt twice trying to reach him and left a message. He no-showed his iron infusion today. I have also received a message from [facility] advising they attempted to reach the pt three times as well, with no return call. They were trying to schedule him at Dr. Derek Mound request. Lastly, I attempted to reach his listed contact, Jeri, and there was no answer and no answering machine.

## 2021-05-16 NOTE — Telephone Encounter (Signed)
Per Ellen Henri CMA this patient is homeless, because of this its hard to reach him.

## 2021-05-19 NOTE — Telephone Encounter (Signed)
Following is ongoing correspondence via My Chart re: rescheduling of pt appt to a sooner date:  Conversation: Change appointment date [employer] First) May 19, 2021 Me to [name], [name]     6:21 AM [name],   Unfortunately, Dr. Leonides Schanz is not available on that day. I know she is wanting to work you in sooner and her next available date in the office is 05/27/21. Are you able to come in this day at 8:30am?  This MyChart message has not been read. May 16, 2021 [name], [name] to [name], [name]     4:25 PM Good afternoon: 05-23-2021 at 11am is fine for me. Cheers, [employer], CMA to [name], [name]     12:56 PM Can you be seen sooner then your original scheduled appt? Please contact our office by telephone if this form of communication works better for you.  Last read by [name] at 4:24 PM on 05/16/2021. [name], [name] to Me     10:31 AM Good morning. Sorry, but I am just now seeing this message. 9-9--2022 does not work for me.

## 2021-05-19 NOTE — Telephone Encounter (Signed)
Conversation: Change appointment date (Newest Message First) May 19, 2021 Me to [name], [name]     10:06 AM We will plan to keep your appointment as scheduled per your request. To ensure you attend the correct location for this appointment, the office address has been provided below:   [facility] [address] [place], [address]rd [address] [place], [place] [address] Located in the same building as [facility]  This MyChart message has not been read. [name] to Me     10:01 AM Hello: Oh my gosh. Because of having the same names, I was getting SO confused.  Let's just stay with current appointment since I am so easily confused. Cheers, [name]  Me to [name], [name]     9:52 AM This is a new patient appointment in our office to meet with Dr. Leonides Rice, your assigned gastroenterologist. Bonita Quin were referred to her by your hematologist/oncologist, Dr. Leonides Rice. Yes, both providers have the same last name but different specialties.  Last read by [name] at 9:58 AM on 05/19/2021. [name] to Me     9:39 AM Hello: Is the appointment for a consultation or a procedure? Cheers, [name]  Me to [name], [name]     9:04 AM Are you able to be seen earlier a  GASTROENTEROLOGY on 05/27/21 @ 8:30am?  Last read by [name] at 9:58 AM on 05/19/2021. [name], [name] to Me     9:00 AM Good morning: Thank you for clarifying. I'm getting many telephone calls and MyChart messages, and I was getting some mixed up with others. Cheers, [name]  Me to [name], [name]     8:54 AM Hi Jeffery Maduro,   This is NOT regarding your infusion. That appointment remains as scheduled. This is your GASTROENTEROLOGY appointment.  Last read by [name] at 9:58 AM on 05/19/2021. [name], [name] to Me     8:30 AM Good morning: According to MyChart, I am still scheduled for infusion on 05-23-2021 at 11am. Has that  changed? Cheers, [name]

## 2021-05-22 ENCOUNTER — Other Ambulatory Visit: Payer: Self-pay | Admitting: *Deleted

## 2021-05-22 ENCOUNTER — Encounter: Payer: Self-pay | Admitting: Physician Assistant

## 2021-05-22 DIAGNOSIS — D500 Iron deficiency anemia secondary to blood loss (chronic): Secondary | ICD-10-CM

## 2021-05-23 ENCOUNTER — Inpatient Hospital Stay: Payer: Self-pay

## 2021-05-23 ENCOUNTER — Encounter: Payer: Self-pay | Admitting: General Practice

## 2021-05-23 ENCOUNTER — Other Ambulatory Visit: Payer: Self-pay

## 2021-05-23 VITALS — BP 141/86 | HR 95 | Temp 98.9°F | Resp 18

## 2021-05-23 DIAGNOSIS — D508 Other iron deficiency anemias: Secondary | ICD-10-CM

## 2021-05-23 DIAGNOSIS — D500 Iron deficiency anemia secondary to blood loss (chronic): Secondary | ICD-10-CM

## 2021-05-23 LAB — PREPARE RBC (CROSSMATCH)

## 2021-05-23 LAB — CBC WITH DIFFERENTIAL (CANCER CENTER ONLY)
Abs Immature Granulocytes: 0.03 10*3/uL (ref 0.00–0.07)
Basophils Absolute: 0.1 10*3/uL (ref 0.0–0.1)
Basophils Relative: 1 %
Eosinophils Absolute: 0.3 10*3/uL (ref 0.0–0.5)
Eosinophils Relative: 4 %
HCT: 24.9 % — ABNORMAL LOW (ref 39.0–52.0)
Hemoglobin: 7.1 g/dL — ABNORMAL LOW (ref 13.0–17.0)
Immature Granulocytes: 0 %
Lymphocytes Relative: 9 %
Lymphs Abs: 0.7 10*3/uL (ref 0.7–4.0)
MCH: 19.3 pg — ABNORMAL LOW (ref 26.0–34.0)
MCHC: 28.5 g/dL — ABNORMAL LOW (ref 30.0–36.0)
MCV: 67.7 fL — ABNORMAL LOW (ref 80.0–100.0)
Monocytes Absolute: 0.5 10*3/uL (ref 0.1–1.0)
Monocytes Relative: 7 %
Neutro Abs: 6 10*3/uL (ref 1.7–7.7)
Neutrophils Relative %: 79 %
Platelet Count: 441 10*3/uL — ABNORMAL HIGH (ref 150–400)
RBC: 3.68 MIL/uL — ABNORMAL LOW (ref 4.22–5.81)
RDW: 16.3 % — ABNORMAL HIGH (ref 11.5–15.5)
WBC Count: 7.6 10*3/uL (ref 4.0–10.5)
nRBC: 0 % (ref 0.0–0.2)

## 2021-05-23 LAB — SAMPLE TO BLOOD BANK

## 2021-05-23 MED ORDER — SODIUM CHLORIDE 0.9 % IV SOLN
200.0000 mg | Freq: Once | INTRAVENOUS | Status: AC
  Administered 2021-05-23: 200 mg via INTRAVENOUS
  Filled 2021-05-23: qty 200

## 2021-05-23 MED ORDER — SODIUM CHLORIDE 0.9 % IV SOLN: Freq: Once | INTRAVENOUS | Status: AC

## 2021-05-23 NOTE — Progress Notes (Signed)
[facility] CSW Progress Notes  Met w patient in infusion, he is receiving IV iron for diagnosis of iron deficiency anemia.  He is currently being followed by Casimer Lanius LCSW as part of his care at his PCP.  He reports that he lost his housing approximately 2 years ago after being let go from his job, lost income, was evicted.  He states that his severe fatigue led to missing work due to sleeping.  In addition, he fell asleep during meeting w supervisor and it was determined that he was not safe to drive the company car which was part of his job. He reports that his fatigue is exceptionally severe.  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  This was difficult for him as he often had to climb steps in order to make deliveries.  His physical exhaustion limited his ability to continue to deliver pizzas.  He currently sleeps in his car in church parking lots.  He is prescribed a CPAP machine, but cannot use it as he has no electricity in his car.  He finds public places where he can shower or use the restroom.  He goes to gas stations and asks patrons for money for food and/or gas.  He has no way to store or prepare food, thus depends on getting funds for prepared foods.  He does have a friend where he receives his mail.  He has recently called Vocational Rehabilitation and asked to be enrolled in their program - he is awaiting paperwork for enrollment.  CSW enrolled him in [facility] and provided first of 4 disbursements.  Completed [facility] referral and sent to [facility] for their street outreach program.  Completed paperwork for [facility] Nutrition Program which can help with food delivery/pick up of easy to eat foods.   CSW messaged CSW Laurance Flatten to coordinate care.  CSW will follow up next Friday to assess progress.  Edwyna Shell, LCSW Clinical Social Worker Phone:  [phone]

## 2021-05-23 NOTE — Patient Instructions (Signed)

## 2021-05-23 NOTE — Progress Notes (Signed)
Patient was observed for 30 minutes post infusion with no adverse reactions. Patient reported homelessness during intake assessment and was offered resources from SW.

## 2021-05-26 ENCOUNTER — Inpatient Hospital Stay: Payer: Self-pay

## 2021-05-26 ENCOUNTER — Other Ambulatory Visit: Payer: Self-pay

## 2021-05-26 DIAGNOSIS — D508 Other iron deficiency anemias: Secondary | ICD-10-CM

## 2021-05-26 MED ORDER — SODIUM CHLORIDE 0.9% IV SOLUTION
250.0000 mL | Freq: Once | INTRAVENOUS | Status: AC
  Administered 2021-05-26: 250 mL via INTRAVENOUS

## 2021-05-26 NOTE — Progress Notes (Signed)
..  Patient is receiving Replacement Medication. Medication: Venofer (iron sucrose) Manufacture: [employer] Approval Dates: Approved from 05/26/2021 until 08/24/2021. ID: [id number] Reason: Self Pay First DOS: 05/23/2021. Marland KitchenDarlyne Russian, CPhT IV Drug Replacement Specialist [facility] Phone: [phone]

## 2021-05-26 NOTE — Patient Instructions (Signed)
Blood Transfusion, Adult, Care After This sheet gives you information about how to care for yourself after your procedure. Your doctor may also give you more specific instructions. If you have problems or questions, contact your doctor. What can I expect after the procedure? After the procedure, it is common to have: Bruising and soreness at the IV site. A headache. Follow these instructions at home: Insertion site care   Follow instructions from your doctor about how to take care of your insertion site. This is where an IV tube was put into your vein. Make sure you: Wash your hands with soap and water before and after you change your bandage (dressing). If you cannot use soap and water, use hand sanitizer. Change your bandage as told by your doctor. Check your insertion site every day for signs of infection. Check for: Redness, swelling, or pain. Bleeding from the site. Warmth. Pus or a bad smell. General instructions Take over-the-counter and prescription medicines only as told by your doctor. Rest as told by your doctor. Go back to your normal activities as told by your doctor. Keep all follow-up visits as told by your doctor. This is important. Contact a doctor if: You have itching or red, swollen areas of skin (hives). You feel worried or nervous (anxious). You feel weak after doing your normal activities. You have redness, swelling, warmth, or pain around the insertion site. You have blood coming from the insertion site, and the blood does not stop with pressure. You have pus or a bad smell coming from the insertion site. Get help right away if: You have signs of a serious reaction. This may be coming from an allergy or the body's defense system (immune system). Signs include: Trouble breathing or shortness of breath. Swelling of the face or feeling warm (flushed). Fever or chills. Head, chest, or back pain. Dark pee (urine) or blood in the pee. Widespread rash. Fast  heartbeat. Feeling dizzy or light-headed. You may receive your blood transfusion in an outpatient setting. If so, you will be told whom to contact to report any reactions. These symptoms may be an emergency. Do not wait to see if the symptoms will go away. Get medical help right away. Call your local emergency services (911 in the [place].S.). Do not drive yourself to the hospital. Summary Bruising and soreness at the IV site are common. Check your insertion site every day for signs of infection. Rest as told by your doctor. Go back to your normal activities as told by your doctor. Get help right away if you have signs of a serious reaction. This information is not intended to replace advice given to you by your health care provider. Make sure you discuss any questions you have with your health care provider. Document Revised: 12/19/2020 Document Reviewed: 02/16/2019 Elsevier Patient Education  2022 Elsevier Inc.  

## 2021-05-27 LAB — BPAM RBC
Blood Product Expiration Date: 202210072359
ISSUE DATE / TIME: 202209190815
Unit Type and Rh: 6200

## 2021-05-27 LAB — TYPE AND SCREEN
ABO/RH(D): A POS
Antibody Screen: NEGATIVE
Unit division: 0

## 2021-05-30 ENCOUNTER — Encounter: Payer: Self-pay | Admitting: General Practice

## 2021-05-30 ENCOUNTER — Inpatient Hospital Stay: Payer: Self-pay | Admitting: General Practice

## 2021-05-30 ENCOUNTER — Other Ambulatory Visit: Payer: Self-pay

## 2021-05-30 ENCOUNTER — Encounter: Payer: Self-pay | Admitting: Hematology and Oncology

## 2021-05-30 ENCOUNTER — Inpatient Hospital Stay: Payer: Self-pay

## 2021-05-30 VITALS — BP 120/73 | HR 76 | Temp 98.3°F | Resp 18

## 2021-05-30 DIAGNOSIS — D508 Other iron deficiency anemias: Secondary | ICD-10-CM

## 2021-05-30 MED ORDER — SODIUM CHLORIDE 0.9 % IV SOLN
200.0000 mg | Freq: Once | INTRAVENOUS | Status: AC
  Administered 2021-05-30: 200 mg via INTRAVENOUS
  Filled 2021-05-30: qty 200

## 2021-05-30 MED ORDER — SODIUM CHLORIDE 0.9 % IV SOLN: Freq: Once | INTRAVENOUS | Status: AC

## 2021-05-30 NOTE — Progress Notes (Signed)
[facility] CSW Progress Notes  Unable to meet w patient in infusion, called him.  States he slept through entire infusion, wanted to go to chapel to sleep, was unable to be directed there.  Slept in his car, remains very sleepy.  He has not heard from [facility], [facility] or [facility] street outreach. CSW will refax referral to [facility] and get status update for other agencies.  Also messaged CSW Christell Constant and patient's PCP to ask if there is long term plan for assisting patient as he is seen at [facility] for additional issues.  Santa Genera, LCSW Clinical Social Worker Phone:  [phone]

## 2021-05-30 NOTE — Patient Instructions (Signed)

## 2021-05-30 NOTE — Progress Notes (Addendum)
[facility] CSW Progress Notes  error

## 2021-05-30 NOTE — Progress Notes (Signed)
Met with patient at registration to introduce myself as Arboriculturist and to offer available resources.  Discussed one-time $1000 Radio broadcast assistant to assist with personal expenses while going through treatment. Advised what he may provide for his current situation to apply for grant. He states he was sleepy but would complete and may bring back at his next visit.  Gave him card to transportation program should he need in the future.  Gave him GFE(Good Faith Estimate) and advised all uninsured patients receive an automatic 57% discount for being uninsured and gave him a Medicaid application to complete if interested in applying and must be done before applying for any additional assistance such as [employer]  which may possibly offer more than the 57%.  He has all paperwork and my card for any additional financial questions or concerns.

## 2021-06-02 ENCOUNTER — Ambulatory Visit: Payer: Self-pay | Admitting: Internal Medicine

## 2021-06-06 ENCOUNTER — Encounter: Payer: Self-pay | Admitting: Family Medicine

## 2021-06-06 ENCOUNTER — Inpatient Hospital Stay: Payer: Self-pay

## 2021-06-06 ENCOUNTER — Other Ambulatory Visit: Payer: Self-pay

## 2021-06-06 VITALS — BP 150/84 | HR 84 | Temp 98.1°F | Resp 20 | Ht 71.0 in | Wt 273.0 lb

## 2021-06-06 DIAGNOSIS — D508 Other iron deficiency anemias: Secondary | ICD-10-CM

## 2021-06-06 MED ORDER — SODIUM CHLORIDE 0.9 % IV SOLN
200.0000 mg | Freq: Once | INTRAVENOUS | Status: AC
  Administered 2021-06-06: 200 mg via INTRAVENOUS
  Filled 2021-06-06: qty 200

## 2021-06-06 MED ORDER — SODIUM CHLORIDE 0.9 % IV SOLN: Freq: Once | INTRAVENOUS | Status: AC

## 2021-06-06 NOTE — Patient Instructions (Signed)

## 2021-06-10 ENCOUNTER — Encounter: Payer: Self-pay | Admitting: General Practice

## 2021-06-10 NOTE — Progress Notes (Signed)
[facility] CSW Progress Notes  Checked w [employer] re referral to them for help w filing for Social Security disability.  They have not been able to reach him by phone.  Called patient, spoke w him, gave him direct dial line for agency.  His case has been assigned to [place], [place] Disability specialist.  Also left [place] a VM, encouraging her to continue to try to reach patient.  Santa Genera, LCSW Clinical Social Worker Phone:  [phone]

## 2021-06-11 ENCOUNTER — Ambulatory Visit: Payer: Self-pay | Admitting: Licensed Clinical Social Worker

## 2021-06-11 DIAGNOSIS — Z7689 Persons encountering health services in other specified circumstances: Secondary | ICD-10-CM

## 2021-06-11 NOTE — Chronic Care Management (AMB) (Signed)
  Care Management  Collaboration  Note  06/11/2021 Name: [name] MRN: [id number] DOB: February 05, 1966  [name] is a 55 y.o. year old male who is a primary care patient of [place], Theador Hawthorne, MD. The CCM team was consulted reference care coordination needs for [employer] , Level of Care Concerns, and Financial Difficulties related to no income .  Assessment: Patient was not interviewed or contacted during this encounter. He is receiving medical treatment and support at [facility] See Care Plan or interventions for patient self-care actives.   Intervention:Conducted brief assessment, recommendations and relevant information discussed. CCM LCSW collaborated with Scientist, forensic .    Follow up Plan:  No follow up scheduled with CCM LCSW at this time. Will follow up with patient in 30 days if needed . Patient indicated he will call office if needed. CCM LCSW will continue to collaborate with Oncology Social Worker in order to meet patient's needs .    Review of patient past medical history, allergies, medications, and health status, including review of pertinent consultant reports was performed as part of comprehensive evaluation and provision of care management/care coordination services.   Care Plan Conditions to be addressed/monitored per PCP order: Homelessness, Limited social support   Care Plan : General Social Work (Adult)  Updates made by Soundra Pilon, LCSW since 06/11/2021 12:00 AM     Problem: Barriers to Treatment needs community support      Goal: Connect with community resources for support   Start Date: 05/06/2021  This Visit's Progress: On track  Recent Progress: On track  Priority: High  Note:   Current barriers:    Financial constraints related to no income, Limited social support, and Housing barriers Clinical Goals: Patient will work with agencies discussed today to address needs related to barriers to care Clinical Interventions:   Inter-disciplinary care team collaboration (see longitudinal plan of care) Collaboration with Programmer, applications, Child psychotherapist in Oncology  Assisted with connecting patient to [employer] for Engineer, agricultural of Engineer, site ( food stamps ), [employer] , Various insurance options ( Orange Card, Haematologist ), Housing resources (Partnership ending Homeless), and Referral for [employer] (to vocational rehabilitation ) Has completed [employer] application Now receiving foodstamps Solution-Focused Strategies, Active listening / Reflection utilized , and Problem Solving /Task Center  Patient Goals/Self-Care Activities: Over the next 30 days Call [employer] [phone]  Follow up with [facility]  Call [facility]eless[facility]: Coordinated Entry [phone]     Sammuel Hines, LCSW Care Management & Coordination  [facility] / [facility] [employer]   [phone]

## 2021-06-11 NOTE — Patient Instructions (Signed)
Visit Information   Goals Addressed             This Visit's Progress    Find Help in My Community   On track    Timeframe:  Long-Range Goal Priority:  High Start Date:  05/06/2021                           Expected End Date:                        Patient Goals/Self-Care Activities: Over the next 30 days Call [facility] [phone]  Follow up with [facility]  Call [facility]: Coordinated Entry [phone]     Why is this important?   Knowing how and where to find help for yourself or family in your neighborhood and community is an important skill.       Patient was not contacted during this encounter.  LCSW collaborated with care team to accomplish patient's care plan goal   Sammuel Hines, [facility] Care Management & Coordination  [phone]

## 2021-06-13 ENCOUNTER — Other Ambulatory Visit: Payer: Self-pay

## 2021-06-13 ENCOUNTER — Inpatient Hospital Stay: Payer: Self-pay | Attending: Hematology and Oncology

## 2021-06-13 ENCOUNTER — Ambulatory Visit: Payer: Self-pay | Admitting: Physician Assistant

## 2021-06-13 ENCOUNTER — Inpatient Hospital Stay: Payer: Self-pay | Admitting: General Practice

## 2021-06-13 VITALS — BP 149/88 | HR 90 | Temp 98.1°F | Resp 18

## 2021-06-13 DIAGNOSIS — D509 Iron deficiency anemia, unspecified: Secondary | ICD-10-CM | POA: Insufficient documentation

## 2021-06-13 DIAGNOSIS — D508 Other iron deficiency anemias: Secondary | ICD-10-CM

## 2021-06-13 MED ORDER — SODIUM CHLORIDE 0.9 % IV SOLN: Freq: Once | INTRAVENOUS | Status: AC

## 2021-06-13 MED ORDER — SODIUM CHLORIDE 0.9 % IV SOLN
200.0000 mg | Freq: Once | INTRAVENOUS | Status: AC
  Administered 2021-06-13: 200 mg via INTRAVENOUS
  Filled 2021-06-13: qty 200

## 2021-06-13 NOTE — Patient Instructions (Signed)

## 2021-06-13 NOTE — Progress Notes (Signed)
[facility] CSW Progress Notes  Patient given second disbursement of [employer], he has two more available while in active treatment at [facility].  He is meeting w Myami Skertich, [employer] SOAR disability caseworker, to begin his application for Social Security disability.  He will need to follow up w this agency, he was given their contact information.  Santa Genera, LCSW Clinical Social Worker Phone:  [phone]

## 2021-06-20 ENCOUNTER — Inpatient Hospital Stay: Payer: Self-pay | Admitting: General Practice

## 2021-06-20 ENCOUNTER — Other Ambulatory Visit: Payer: Self-pay

## 2021-06-20 ENCOUNTER — Inpatient Hospital Stay: Payer: Self-pay

## 2021-06-20 VITALS — BP 140/83 | HR 99 | Temp 99.5°F | Resp 16

## 2021-06-20 DIAGNOSIS — D508 Other iron deficiency anemias: Secondary | ICD-10-CM

## 2021-06-20 MED ORDER — SODIUM CHLORIDE 0.9 % IV SOLN
200.0000 mg | Freq: Once | INTRAVENOUS | Status: AC
  Administered 2021-06-20: 200 mg via INTRAVENOUS
  Filled 2021-06-20: qty 200

## 2021-06-20 MED ORDER — SODIUM CHLORIDE 0.9 % IV SOLN: Freq: Once | INTRAVENOUS | Status: AC

## 2021-06-20 NOTE — Progress Notes (Signed)
[place] CSW Progress Notes  Check in visit w patient during infusion.  He states he is not noticing significant impact from infusions - he reports overwhelming fatigue, "I feel like crying most of the time", cannot must energy to accomplish tasks he would like to .  He will stay in touch w [facility] worker - [facility] Skertich.  She met w him last week and took his application for social Security disability.  She has nothing to report at this time.  Provided 3rd disbursement of [employer] - he can request final disbursement at this upcoming visit in November 2022.  Edwyna Shell, LCSW Clinical Social Worker Phone:  [phone]

## 2021-06-20 NOTE — Progress Notes (Signed)
Patient was observed for 15 minutes post iron infusion with no complaints. Vitals stable and patient in no distress upon leaving infusion room.

## 2021-06-20 NOTE — Patient Instructions (Signed)

## 2021-06-26 ENCOUNTER — Telehealth: Payer: Self-pay

## 2021-06-26 NOTE — Telephone Encounter (Signed)
LEFT VM IN REGARDS TO MEDICATION PICKUP (BREO INHALERS).  3 INHALERS IN MED ROOM , LABELED & READY

## 2021-07-07 ENCOUNTER — Ambulatory Visit: Payer: Self-pay | Admitting: Licensed Clinical Social Worker

## 2021-07-07 NOTE — Chronic Care Management (AMB) (Signed)
  Care Management   Note  07/07/2021 Name: [name] MRN: [id number] DOB: 1966-06-28  [name] is a 55 y.o. year old male who is a primary care patient of [place], Theador Hawthorne, MD. The CCM team was consulted reference care coordination needs for [name] .  Assessment: Patient was not interviewed or contacted during this encounter. He has been working with [employer] for Disability and [employer] Social Worker [place].  Patient has completed goals started with CCM LCSW and has been provided all available resources.  Patient indicated during last encounter he will call LCSW if needed.  No calls received in 60 days  Care Plan or interventions for patient self-care actives.   Follow up Plan:  Patient does not require continued follow-up by LCSW. Will contact the office if needed LCSW will disconnect from care team, please place new referral if needed.    Review of patient past medical history, allergies, medications, and health status, including review of pertinent consultant reports was performed as part of comprehensive evaluation and provision of care management/care coordination services.   Care Plan Conditions to be addressed/monitored per PCP order: , community support   Care Plan : General Social Work (Adult)  Updates made by Soundra Pilon, LCSW since 07/07/2021 12:00 AM     Problem: Barriers to Treatment needs community support      Goal: Connect with community resources for support   Start Date: 05/06/2021  This Visit's Progress: On track  Recent Progress: On track  Priority: High  Note:   Current barriers:    Financial constraints related to no income, Limited social support, and Housing barriers Clinical Goals: Patient will work with agencies discussed today to address needs related to barriers to care Clinical Interventions:  Inter-disciplinary care team collaboration (see longitudinal plan of care) Collaboration with Programmer, applications, Arts development officer in Oncology  Assisted with connecting patient to [employer] for Engineer, agricultural of Engineer, site ( food stamps ), [employer] , Various insurance options ( Orange Card, Haematologist ), Housing resources (Partnership ending Homeless), and Referral for [employer] (to vocational rehabilitation ) Has completed [employer] application Now receiving foodstamps Solution-Focused Strategies, Active listening / Reflection utilized , and Problem Solving /Task Center  Patient Goals/Self-Care Activities: Over the next 30 days Call [employer] [phone]  Follow up with [facility]  Call Partnership Ending Homelessness: Coordinated Entry [phone]     Sammuel Hines, LCSW Care Management & Coordination  [facility] / [employer]   [phone]

## 2021-07-07 NOTE — Patient Instructions (Signed)
   Patient was not contacted during this encounter.  LCSW provided consultation to assist with unmet needs.  Kealii Thueson, LCSW Care Management & Coordination  [phone]  

## 2021-07-14 ENCOUNTER — Other Ambulatory Visit: Payer: Self-pay | Admitting: Physician Assistant

## 2021-07-14 ENCOUNTER — Encounter: Payer: Self-pay | Admitting: Physician Assistant

## 2021-07-14 DIAGNOSIS — D508 Other iron deficiency anemias: Secondary | ICD-10-CM

## 2021-07-15 ENCOUNTER — Encounter: Payer: Self-pay | Admitting: Hematology and Oncology

## 2021-07-15 ENCOUNTER — Inpatient Hospital Stay (HOSPITAL_BASED_OUTPATIENT_CLINIC_OR_DEPARTMENT_OTHER): Payer: Self-pay | Admitting: Physician Assistant

## 2021-07-15 ENCOUNTER — Inpatient Hospital Stay: Payer: Self-pay | Attending: Hematology and Oncology

## 2021-07-15 ENCOUNTER — Telehealth: Payer: Self-pay

## 2021-07-15 ENCOUNTER — Other Ambulatory Visit: Payer: Self-pay

## 2021-07-15 VITALS — BP 158/92 | HR 98 | Temp 98.1°F | Resp 17 | Wt 290.3 lb

## 2021-07-15 DIAGNOSIS — D75839 Thrombocytosis, unspecified: Secondary | ICD-10-CM | POA: Insufficient documentation

## 2021-07-15 DIAGNOSIS — D509 Iron deficiency anemia, unspecified: Secondary | ICD-10-CM | POA: Insufficient documentation

## 2021-07-15 DIAGNOSIS — D508 Other iron deficiency anemias: Secondary | ICD-10-CM

## 2021-07-15 DIAGNOSIS — Z5900 Homelessness unspecified: Secondary | ICD-10-CM | POA: Insufficient documentation

## 2021-07-15 LAB — IRON AND TIBC
Iron: 29 ug/dL — ABNORMAL LOW (ref 42–163)
Saturation Ratios: 8 % — ABNORMAL LOW (ref 20–55)
TIBC: 366 ug/dL (ref 202–409)
UIBC: 338 ug/dL (ref 117–376)

## 2021-07-15 LAB — RETIC PANEL
Immature Retic Fract: 47 % — ABNORMAL HIGH (ref 2.3–15.9)
RBC.: 4.56 MIL/uL (ref 4.22–5.81)
Retic Count, Absolute: 57 10*3/uL (ref 19.0–186.0)
Retic Ct Pct: 1.3 % (ref 0.4–3.1)
Reticulocyte Hemoglobin: 23.8 pg — ABNORMAL LOW (ref >27.9)

## 2021-07-15 LAB — CBC WITH DIFFERENTIAL (CANCER CENTER ONLY)
Abs Immature Granulocytes: 0.14 10*3/uL — ABNORMAL HIGH (ref 0.00–0.07)
Basophils Absolute: 0.1 10*3/uL (ref 0.0–0.1)
Basophils Relative: 1 %
Eosinophils Absolute: 0.5 10*3/uL (ref 0.0–0.5)
Eosinophils Relative: 5 %
HCT: 33.4 % — ABNORMAL LOW (ref 39.0–52.0)
Hemoglobin: 9.8 g/dL — ABNORMAL LOW (ref 13.0–17.0)
Immature Granulocytes: 1 %
Lymphocytes Relative: 9 %
Lymphs Abs: 1 10*3/uL (ref 0.7–4.0)
MCH: 21.4 pg — ABNORMAL LOW (ref 26.0–34.0)
MCHC: 29.3 g/dL — ABNORMAL LOW (ref 30.0–36.0)
MCV: 73.1 fL — ABNORMAL LOW (ref 80.0–100.0)
Monocytes Absolute: 0.8 10*3/uL (ref 0.1–1.0)
Monocytes Relative: 7 %
Neutro Abs: 9 10*3/uL — ABNORMAL HIGH (ref 1.7–7.7)
Neutrophils Relative %: 77 %
Platelet Count: 404 10*3/uL — ABNORMAL HIGH (ref 150–400)
RBC: 4.57 MIL/uL (ref 4.22–5.81)
RDW: 21.4 % — ABNORMAL HIGH (ref 11.5–15.5)
WBC Count: 11.5 10*3/uL — ABNORMAL HIGH (ref 4.0–10.5)
nRBC: 0 % (ref 0.0–0.2)

## 2021-07-15 LAB — SAMPLE TO BLOOD BANK

## 2021-07-15 LAB — FERRITIN: Ferritin: 15 ng/mL — ABNORMAL LOW (ref 24–336)

## 2021-07-15 NOTE — Telephone Encounter (Signed)
Pt advised of lab results and the need for IV iron.  Pt with VU

## 2021-07-15 NOTE — Telephone Encounter (Signed)
-----   Message from Briant Cedar, PA-C sent at 07/15/2021  3:10 PM EST ----- Please call patient and let him know that results show improved iron levels but still deficient so we will proceed with another round of IV iron.

## 2021-07-15 NOTE — Progress Notes (Signed)
Pt is approved for the $1000 [employer] grant.  

## 2021-07-15 NOTE — Progress Notes (Signed)
[facility] [phone]   [phone]  PROGRESS NOTE  Patient Care Team: Kinnie Feil, MD as PCP - General (Family Medicine)  Hematological/Oncological History 1) 03/14/2021: TIBC 349, Iron 30 (L), Iron saturation 9% (L), Ferritin 15 (L), Vitamin B12 537, Folate 7.4, WBC 8.6, Hgb 10.7 (L), MCV 74 (L), Plt 450.   2) 05/07/2021: Establish care with Dede Query PA-C  3) 05/26/2021-06/20/2021: Received IV venofer x 5 doses  CHIEF COMPLAINTS/PURPOSE OF CONSULTATION:  Iron deficiency anemia  HISTORY OF PRESENTING ILLNESS:  [name] 55 y.o. male returns for follow-up for iron deficiency anemia.  Since the last visit on 05/07/2021, patient received IV Venofer x5 doses.  He was unable to make his a consultation with gastroenterology as recommended.  On exam today, [name] reports energy levels have not changed and continues to have fatigue.  He is able to complete his ADLs but reports limitations due to being homeless and decreased energy levels.  He adds that he has gained another 10 pounds since our last visit due to an unhealthy diet and inactivity.  He denies any nausea, vomiting or abdominal pain.  He denies any bowel habit changes including diarrhea or constipation.  He denies easy bruising or signs of bleeding.  He reports mild numbness and tingling in his fingertips and toes without any interference with grip or balance.  He denies any fevers, chills, night sweats, shortness of breath, chest pain, cough, dizziness, syncopal episodes, rash or other skin changes. He has no other complaints. Rest of the 10 point ROS is below.   MEDICAL HISTORY:  Past Medical History:  Diagnosis Date   ADD (attention deficit disorder)    ADD (attention deficit disorder)    Anemia, iron deficiency    Asthma    Blood transfusion    Constipation    Depression    Depression    Exertional dyspnea    secondary to anemia   Hiatal hernia    Sleep apnea 01/2018     SURGICAL HISTORY: No past surgical history on file.  SOCIAL HISTORY: Social History   Socioeconomic History   Marital status: Single    Spouse name: Not on file   Number of children: 0   Years of education: Not on file   Highest education level: Not on file  Occupational History   Occupation: student-Business  Tobacco Use   Smoking status: Never   Smokeless tobacco: Never  Substance and Sexual Activity   Alcohol use: No   Drug use: No   Sexual activity: Not Currently  Other Topics Concern   Not on file  Social History Narrative   Lives alone. Never married, no children.  Has two sister who live in [place].  Student at work study.  [employer].  Working toward Engineer, technical sales.  Previously worked in Engineer, manufacturing systems.  Was also previously homeless due to job loss and psychiatric exacerbation.   Social Determinants of Health   Financial Resource Strain: High Risk   Difficulty of Paying Living Expenses: Hard  Food Insecurity: Food Insecurity Present   Worried About Running Out of Food in the Last Year: Sometimes true   Ran Out of Food in the Last Year: Sometimes true  Transportation Needs: Unmet Transportation Needs   Lack of Transportation (Medical): Yes   Lack of Transportation (Non-Medical): Yes  Physical Activity: Not on file  Stress: Not on file  Social Connections: Not on file  Intimate Partner Violence: Not on file  FAMILY HISTORY: Family History  Problem Relation Age of Onset   Heart disease Mother    Heart disease Father    Diabetes Paternal Grandfather    Cancer Neg Hx    Stroke Neg Hx     ALLERGIES:  has No Known Allergies.  MEDICATIONS:  Current Outpatient Medications  Medication Sig Dispense Refill   albuterol (VENTOLIN HFA) 108 (90 Base) MCG/ACT inhaler Inhale 1-2 puffs into the lungs every 6 (six) hours as needed for wheezing or shortness of breath. 18 g 1   fluticasone furoate-vilanterol (BREO ELLIPTA) 100-25 MCG/INH  AEPB Inhale 1 puff into the lungs daily. 1 each 0   No current facility-administered medications for this visit.    REVIEW OF SYSTEMS:   Constitutional: ( - ) fevers, ( - )  chills , ( - ) night sweats Eyes: ( - ) blurriness of vision, ( - ) double vision, ( - ) watery eyes Ears, nose, mouth, throat, and face: ( - ) mucositis, ( - ) sore throat Respiratory: ( - ) cough, ( - ) dyspnea, ( - ) wheezes Cardiovascular: ( - ) palpitation, ( - ) chest discomfort, ( - ) lower extremity swelling Gastrointestinal:  ( - ) nausea, ( - ) heartburn, ( - ) change in bowel habits Skin: ( - ) abnormal skin rashes Lymphatics: ( - ) new lymphadenopathy, ( - ) easy bruising Neurological: ( - ) numbness, ( - ) tingling, ( - ) new weaknesses Behavioral/Psych: ( - ) mood change, ( - ) new changes  All other systems were reviewed with the patient and are negative.  PHYSICAL EXAMINATION: ECOG PERFORMANCE STATUS: 1 - Symptomatic but completely ambulatory  Vitals:   07/15/21 1246  BP: (!) 158/92  Pulse: 98  Resp: 17  Temp: 98.1 F (36.7 C)  SpO2: 99%   Filed Weights   07/15/21 1246  Weight: 290 lb 5 oz (131.7 kg)    GENERAL: well appearing male in NAD, obese SKIN: skin color, texture, turgor are normal, no rashes or significant lesions EYES: conjunctiva are pink and non-injected, sclera clear OROPHARYNX: no exudate, no erythema; lips, buccal mucosa, and tongue normal  NECK: supple, non-tender LYMPH:  no palpable lymphadenopathy in the cervical or supraclavicular lymph nodes.  LUNGS: clear to auscultation and percussion with normal breathing effort HEART: regular rate & rhythm and no murmurs and no lower extremity edema ABDOMEN: soft, non-tender, non-distended, normal bowel sounds Musculoskeletal: no cyanosis of digits and no clubbing  PSYCH: alert & oriented x 3, fluent speech NEURO: no focal motor/sensory deficits  LABORATORY DATA:  I have reviewed the data as listed CBC Latest Ref Rng &  Units 07/15/2021 05/23/2021 05/07/2021  WBC 4.0 - 10.5 K/uL 11.5(H) 7.6 9.6  Hemoglobin 13.0 - 17.0 g/dL 6.2(G) 7.1(L) 7.5(L)  Hematocrit 39.0 - 52.0 % 33.4(L) 24.9(L) 26.1(L)  Platelets 150 - 400 K/uL 404(H) 441(H) 461(H)    CMP Latest Ref Rng & Units 05/07/2021 03/14/2021 09/14/2017  Glucose 70 - 99 mg/dL 98 98 99  BUN 6 - 20 mg/dL 31(D) 18 14  Creatinine 0.61 - 1.24 mg/dL 1.76 1.60 7.37  Sodium 135 - 145 mmol/L 140 139 139  Potassium 3.5 - 5.1 mmol/L 4.6 5.5(H) 4.5  Chloride 98 - 111 mmol/L 106 102 104  CO2 22 - 32 mmol/L 25 25 22   Calcium 8.9 - 10.3 mg/dL 9.2 9.4 9.3  Total Protein 6.5 - 8.1 g/dL 7.1 - 7.0  Total Bilirubin 0.3 - 1.2 mg/dL ) - <  0.2  Alkaline Phos 38 - 126 U/L 81 - 87  AST 15 - 41 U/L 33 - 16  ALT 0 - 44 U/L 60(H) - 12    ASSESSMENT & PLAN [name] is a 55 y.o. male returns for follow-up for iron deficiency anemia.  #Iron deficiency anemia: --Etiology unknown, he denies easy bruising or signs of bleeding. Prior cause was secondary to significant gastritis and esophagitis. Patient was unable to make the consultation with low-power gastroenterology so we have requested a new appointment, is currently scheduled for 08/08/21. --Patient is unable to tolerate oral iron supplementation due to constipation.   --Patient received IV Venofer x5 doses from 05/26/2021 to 06/20/2021.   -- Labs today show improvement of anemia with a hemoglobin of 9.8, MCV 73.1, improvement of thrombocytosis with platelet count of 404K.  Iron deficiency has improved but continues to be deficient with serum iron 29, iron saturation 8%, ferritin 15. --Although there is an improvement of iron deficiency anemia, recommend another round of IV Venofer for x5 doses. --Patient will return 6 weeks after last IV iron infusion to recheck labs.   No orders of the defined types were placed in this encounter.   All questions were answered. The patient knows to call the clinic with any problems,  questions or concerns.  I have spent a total of 30 minutes minutes of face-to-face and non-face-to-face time, preparing to see the patient, performing a medically appropriate examination, counseling and educating the patient, ordering medications, documenting clinical information in the electronic health record, and care coordination.    Dede Query, PA-C Department of Hematology/Oncology [place] at [facility] Phone: [phone]

## 2021-07-16 ENCOUNTER — Encounter: Payer: Self-pay | Admitting: Physician Assistant

## 2021-07-18 ENCOUNTER — Telehealth: Payer: Self-pay | Admitting: Physician Assistant

## 2021-07-18 ENCOUNTER — Other Ambulatory Visit: Payer: Self-pay | Admitting: Physician Assistant

## 2021-07-18 NOTE — Telephone Encounter (Signed)
Scheduled per los, called patient regarding upcoming appointments. 

## 2021-07-21 ENCOUNTER — Telehealth: Payer: Self-pay | Admitting: Physician Assistant

## 2021-07-21 ENCOUNTER — Ambulatory Visit: Payer: Self-pay

## 2021-07-21 NOTE — Telephone Encounter (Signed)
Contacted patient about appointment scheduled in [place]. Left message with appointment details.

## 2021-07-22 ENCOUNTER — Ambulatory Visit: Payer: Self-pay

## 2021-07-23 ENCOUNTER — Telehealth: Payer: Self-pay | Admitting: Physician Assistant

## 2021-07-23 NOTE — Telephone Encounter (Signed)
Scheduled per 11/08 los, patient has been called and voicemail was left. 

## 2021-07-26 ENCOUNTER — Inpatient Hospital Stay: Payer: Self-pay

## 2021-07-29 ENCOUNTER — Inpatient Hospital Stay: Payer: Self-pay

## 2021-07-29 ENCOUNTER — Other Ambulatory Visit: Payer: Self-pay

## 2021-07-29 VITALS — BP 151/83 | HR 99 | Temp 98.6°F | Resp 18

## 2021-07-29 DIAGNOSIS — D508 Other iron deficiency anemias: Secondary | ICD-10-CM

## 2021-07-29 MED ORDER — SODIUM CHLORIDE 0.9 % IV SOLN: Freq: Once | INTRAVENOUS | Status: AC

## 2021-07-29 MED ORDER — SODIUM CHLORIDE 0.9 % IV SOLN
200.0000 mg | Freq: Once | INTRAVENOUS | Status: AC
  Administered 2021-07-29: 200 mg via INTRAVENOUS
  Filled 2021-07-29: qty 200

## 2021-07-29 NOTE — Patient Instructions (Signed)
[facility]COL[facility]  Discharge Instructions: Thank you for choosing [facility] to provide your oncology and hematology care.   If you have a lab appointment with the Cancer Center, please go directly to the Cancer Center and check in at the registration area.   Wear comfortable clothing and clothing appropriate for easy access to any Portacath or PICC line.   We strive to give you quality time with your provider. You may need to reschedule your appointment if you arrive late (15 or more minutes).  Arriving late affects you and other patients whose appointments are after yours.  Also, if you miss three or more appointments without notifying the office, you may be dismissed from the clinic at the provider's discretion.      For prescription refill requests, have your pharmacy contact our office and allow 72 hours for refills to be completed.    Today you received the following chemotherapy and/or immunotherapy agent   To help prevent nausea and vomiting after your treatment, we encourage you to take your nausea medication as directed.  BELOW ARE SYMPTOMS THAT SHOULD BE REPORTED IMMEDIATELY: *FEVER GREATER THAN 100.4 F (38 C) OR HIGHER *CHILLS OR SWEATING *NAUSEA AND VOMITING THAT IS NOT CONTROLLED WITH YOUR NAUSEA MEDICATION *UNUSUAL SHORTNESS OF BREATH *UNUSUAL BRUISING OR BLEEDING *URINARY PROBLEMS (pain or burning when urinating, or frequent urination) *BOWEL PROBLEMS (unusual diarrhea, constipation, pain near the anus) TENDERNESS IN MOUTH AND THROAT WITH OR WITHOUT PRESENCE OF ULCERS (sore throat, sores in mouth, or a toothache) UNUSUAL RASH, SWELLING OR PAIN  UNUSUAL VAGINAL DISCHARGE OR ITCHING   Items with * indicate a potential emergency and should be followed up as soon as possible or go to the Emergency Department if any problems should occur.  Please show the CHEMOTHERAPY ALERT CARD or IMMUNOTHERAPY ALERT CARD at check-in to the Emergency  Department and triage nurse.  Should you have questions after your visit or need to cancel or reschedule your appointment, please contact [facility]  Dept: [phone]  and follow the prompts.  Office hours are 8:00 a.m. to 4:30 p.m. Monday - Friday. Please note that voicemails left after 4:00 p.m. may not be returned until the following business day.  We are closed weekends and major holidays. You have access to a nurse at all times for urgent questions. Please call the main number to the clinic Dept: [phone] and follow the prompts.   For any non-urgent questions, you may also contact your provider using MyChart. We now offer e-Visits for anyone 65 and older to request care online for non-urgent symptoms. For details visit [email or web address].   Also download the MyChart app! Go to the app store, search "MyChart", open the app, select [facility], and log in with your MyChart username and password.  Due to Covid, a mask is required upon entering the hospital/clinic. If you do not have a mask, one will be given to you upon arrival. For doctor visits, patients may have 1 support person aged 81 or older with them. For treatment visits, patients cannot have anyone with them due to current Covid guidelines and our immunocompromised population.

## 2021-08-05 ENCOUNTER — Other Ambulatory Visit: Payer: Self-pay

## 2021-08-05 ENCOUNTER — Inpatient Hospital Stay: Payer: Self-pay

## 2021-08-05 VITALS — BP 148/88 | HR 88 | Temp 98.7°F | Resp 18

## 2021-08-05 DIAGNOSIS — D508 Other iron deficiency anemias: Secondary | ICD-10-CM

## 2021-08-05 MED ORDER — SODIUM CHLORIDE 0.9 % IV SOLN
200.0000 mg | Freq: Once | INTRAVENOUS | Status: AC
  Administered 2021-08-05: 200 mg via INTRAVENOUS
  Filled 2021-08-05: qty 200

## 2021-08-05 MED ORDER — SODIUM CHLORIDE 0.9 % IV SOLN: Freq: Once | INTRAVENOUS | Status: AC

## 2021-08-05 NOTE — Patient Instructions (Signed)

## 2021-08-05 NOTE — Progress Notes (Signed)
Pt refused to stay for 30 minute post Venofer inf observation.  Pt tolerated treatment well without incident. VSS at discharge.  Ambulatory to lobby.

## 2021-08-08 ENCOUNTER — Encounter: Payer: Self-pay | Admitting: Internal Medicine

## 2021-08-08 ENCOUNTER — Ambulatory Visit (INDEPENDENT_AMBULATORY_CARE_PROVIDER_SITE_OTHER): Payer: Self-pay | Admitting: Internal Medicine

## 2021-08-08 VITALS — BP 134/82 | HR 106 | Ht 71.0 in | Wt 287.0 lb

## 2021-08-08 DIAGNOSIS — Z8719 Personal history of other diseases of the digestive system: Secondary | ICD-10-CM

## 2021-08-08 DIAGNOSIS — K449 Diaphragmatic hernia without obstruction or gangrene: Secondary | ICD-10-CM

## 2021-08-08 DIAGNOSIS — D509 Iron deficiency anemia, unspecified: Secondary | ICD-10-CM

## 2021-08-08 DIAGNOSIS — Z86010 Personal history of colonic polyps: Secondary | ICD-10-CM

## 2021-08-08 DIAGNOSIS — Z5900 Homelessness unspecified: Secondary | ICD-10-CM

## 2021-08-08 NOTE — Progress Notes (Signed)
Chief Complaint: IDA  HPI : 55 year old male with history of OSA, asthma, IDA, constipation and ADHD presents with IDA  He has had longstanding issues with IDA extending as far back as 2012 in our system, which have intermittently improved over time. More recently starting in 03/2021, he has been noted to have drops in his hemoglobin again.  Patient has been following hem/onc for IDA and has already received 5 infusions of IV iron.   He has felt fatigued from his iron deficiency anemia.  Denies hematochezia, melena, abdominal pain, nausea, vomiting, diarrhea, constipation, dysphagia, weight loss.  He is actually been gaining weight.  Endorses some occasional chest burning and regurgitation.  He does not take anything for acid reflux.  He had an EGD and colonoscopy done in 2013.  At that time he was found to have polyps, esophagitis, hiatal hernia, gastritis, and duodenitis. He is homeless and living out of his car currently.   Past Medical History:  Diagnosis Date   ADD (attention deficit disorder)    ADD (attention deficit disorder)    Anemia, iron deficiency    Asthma    Blood transfusion    Constipation    Depression    Depression    Exertional dyspnea    secondary to anemia   Hiatal hernia    Sleep apnea 01/2018     Past Surgical History:  Procedure Laterality Date   COLONOSCOPY     Family History  Problem Relation Age of Onset   Diabetes Paternal Grandfather    Cancer Neg Hx    Stroke Neg Hx    Breast cancer Neg Hx    Celiac disease Neg Hx    Cirrhosis Neg Hx    Clotting disorder Neg Hx    Colitis Neg Hx    Colon cancer Neg Hx    Colon polyps Neg Hx    Crohn's disease Neg Hx    Cystic fibrosis Neg Hx    Esophageal cancer Neg Hx    Heart disease Neg Hx    Hemochromatosis Neg Hx    Inflammatory bowel disease Neg Hx    Irritable bowel syndrome Neg Hx    Kidney disease Neg Hx    Liver cancer Neg Hx    Liver disease Neg Hx    Ovarian cancer Neg Hx    Pancreatic  cancer Neg Hx    Prostate cancer Neg Hx    Rectal cancer Neg Hx    Stomach cancer Neg Hx    Ulcerative colitis Neg Hx    Uterine cancer Neg Hx    Wilson's disease Neg Hx    Social History   Tobacco Use   Smoking status: Never   Smokeless tobacco: Never  Vaping Use   Vaping Use: Never used  Substance Use Topics   Alcohol use: No   Drug use: No   Current Outpatient Medications  Medication Sig Dispense Refill   albuterol (VENTOLIN HFA) 108 (90 Base) MCG/ACT inhaler Inhale 1-2 puffs into the lungs every 6 (six) hours as needed for wheezing or shortness of breath. 18 g 1   fluticasone furoate-vilanterol (BREO ELLIPTA) 100-25 MCG/INH AEPB Inhale 1 puff into the lungs daily. 1 each 0   No current facility-administered medications for this visit.   No Known Allergies   Review of Systems: All systems reviewed and negative except where noted in HPI.   Physical Exam: BP 134/82   Pulse (!) 106   Ht 5\' 11"  (1.803 m)  Wt 287 lb (130.2 kg)   SpO2 95%   BMI 40.03 kg/m  Constitutional: Pleasant,well-developed, male in no acute distress. HEENT: Normocephalic and atraumatic. Conjunctivae are normal. No scleral icterus. Cardiovascular: Normal rate, regular rhythm.  Pulmonary/chest: Effort normal and breath sounds normal. No wheezing, rales or rhonchi. Abdominal: Soft, nondistended, nontender. Bowel sounds active throughout. There are no masses palpable. No hepatomegaly. Extremities: No edema Neurological: Alert and oriented to person place and time. Skin: Skin is warm and dry. No rashes noted. Psychiatric: Normal mood and affect. Behavior is normal.  Labs 04/2021: CMP with mildly elevated ALT of 60, other LFTs unremarkable  Labs 07/2021: CBC with elevated WBC of 11.5, Hb of 9.8 (improved from prior), MCV 73.1, plts 404. Ferritin low at 15, low iron sat at 8%, iron low at 29.  CT Chest w/contrast 03/01/12: IMPRESSION:  1.  Mild mid and distal esophageal wall thickening, raising   suspicion for esophagitis or less likely neoplasm. Endoscopy is  recommended for further evaluation if not recently performed.  2.  No soft tissue mass or pathologically enlarged lymph nodes  identified by CT.   CT A/P w/contrast 03/01/12: IMPRESSION:  1.  No evidence of malignancy within the abdomen or pelvis.  2.  Right nephrolithiasis.   EGD 05/02/12: IMPRESSIONS:      1.  Esophagitis lower third  2.  Large hiatus hernia at the gastroesophageal junction  3.  Acute gastritis in the gastric antrum  4.  Duodenitis, non-bleeding bulb; Therapies performed: Multiple biopsies were  performed.  5.  Retroflexion views revealed a hiatal hernia   Path: A.  SMALL BOWEL BIOPSY:       No significant diagnostic abnormality.   Colonoscopy 05/11/12: ENDOSCOPIC IMPRESSION:     1.  10 mm polyp in the descending colon; Therapies  performed: A polypectomy was performed.  2.  Mild diverticulosis in the descending colon  3.  Retroflexed views revealed no abnormalities Path: B.  LEFT COLON, POLYP, BIOPSY:       Tubular adenoma.   ASSESSMENT AND PLAN:  IDA Homeless History of colon polyps History of esophagitis/gastritis/duodenitis Hiatal hernia Patient presents with longstanding IDA that is currently being treated with iron infusions.  His Hb appears to be responding to iron supplementation. Unclear what the source of his anemia is at this time, but he had work-up done and 2013 that showed esophagitis, gastritis, duodenitis, and colon polyps.  Thus esophagitis or gastritis/duodenitis might be accounting for his symptoms currently.  I went over the risks and benefits of EGD and colonoscopy procedures in detail with the patient, and he is agreeable to proceed.  Due to his homeless status, we have reached out to social work at the cancer center to see if they are able to offer the patient any options for going to the bathroom while undergoing a colon preparation - Continue iron supplementation as  managed by hematology - EGD/colonoscopy [facility]. Will plan to discuss with cancer center social work to see what options are available for him to get the prep done  Eulah Pont, MD  I spent 65 minutes of time, including in depth chart review, independent review of results as outlined above, communicating results with the patient directly, face-to-face time with the patient, coordinating care, ordering studies and medications as appropriate, and documentation.

## 2021-08-08 NOTE — Patient Instructions (Signed)
You have been scheduled for a endoscopy and colonoscopy. Please follow written instructions given to you at your visit today.  Please pick up your prep supplies at the pharmacy within the next 1-3 days. If you use inhalers (even only as needed), please bring them with you on the day of your procedure.  Your procedure date is 09/30/2021 at 1:30pm

## 2021-08-11 ENCOUNTER — Other Ambulatory Visit: Payer: Self-pay

## 2021-08-11 ENCOUNTER — Inpatient Hospital Stay: Payer: Self-pay | Attending: Hematology and Oncology

## 2021-08-11 VITALS — BP 147/85 | HR 91 | Temp 98.9°F | Resp 18

## 2021-08-11 DIAGNOSIS — D509 Iron deficiency anemia, unspecified: Secondary | ICD-10-CM | POA: Insufficient documentation

## 2021-08-11 DIAGNOSIS — D508 Other iron deficiency anemias: Secondary | ICD-10-CM

## 2021-08-11 MED ORDER — SODIUM CHLORIDE 0.9 % IV SOLN
200.0000 mg | Freq: Once | INTRAVENOUS | Status: AC
  Administered 2021-08-11: 200 mg via INTRAVENOUS
  Filled 2021-08-11: qty 200

## 2021-08-11 MED ORDER — SODIUM CHLORIDE 0.9 % IV SOLN: Freq: Once | INTRAVENOUS | Status: AC

## 2021-08-12 ENCOUNTER — Encounter: Payer: Self-pay | Admitting: General Practice

## 2021-08-12 NOTE — Progress Notes (Signed)
[facility] CSW Progress Notes  Call from [place], [facility] [phone]).  Spoke w RN.  Apparently there was a secure chat last week that discussed his need for a hotel room in order to do colonoscopy prep prior to procedure requested by [facility] provider I [place].  Patient does have transport to/from procedure, but lives in his car thus cannot do any kind of colonoscopy prep.  CSW reviewed chart, contacted supervisor to determine if any assistance from [facility] is available for this need.  Santa Genera, LCSW Clinical Social Worker Phone:  [phone]

## 2021-08-13 ENCOUNTER — Encounter: Payer: Self-pay | Admitting: General Practice

## 2021-08-13 NOTE — Progress Notes (Signed)
[facility] CSW Progress Notes  Call to patient at request of [facility], patient needs place to complete colonoscopy prep.  Lives in car.  Per Gari Crown, his PCP social worker, Kathreen Cornfield may be able to assist w one night hotel stay for this need.  Called patient to see whether he has made any arrangements himself for this need - no answer, left VM w my contact information and request to return my call to confirm he needs this assistance.  Santa Genera, LCSW Clinical Social Worker Phone:  [phone]

## 2021-08-14 ENCOUNTER — Telehealth: Payer: Self-pay | Admitting: General Practice

## 2021-08-14 NOTE — Telephone Encounter (Signed)
[facility] CSW Progress notes  Call to patient to discuss options/resources for colonoscopy prep process.  No answer, left second VM for patient.  Santa Genera, LCSW Clinical Social Worker Phone:  [phone]

## 2021-08-18 ENCOUNTER — Ambulatory Visit: Payer: Self-pay

## 2021-08-19 ENCOUNTER — Other Ambulatory Visit: Payer: Self-pay

## 2021-08-19 ENCOUNTER — Inpatient Hospital Stay: Payer: Self-pay

## 2021-08-19 ENCOUNTER — Encounter: Payer: Self-pay | Admitting: General Practice

## 2021-08-19 VITALS — BP 160/89 | HR 90 | Temp 98.0°F | Resp 18

## 2021-08-19 DIAGNOSIS — D508 Other iron deficiency anemias: Secondary | ICD-10-CM

## 2021-08-19 MED ORDER — SODIUM CHLORIDE 0.9 % IV SOLN
200.0000 mg | Freq: Once | INTRAVENOUS | Status: AC
  Administered 2021-08-19: 200 mg via INTRAVENOUS
  Filled 2021-08-19: qty 200

## 2021-08-19 MED ORDER — SODIUM CHLORIDE 0.9 % IV SOLN: Freq: Once | INTRAVENOUS | Status: DC

## 2021-08-19 NOTE — Patient Instructions (Signed)

## 2021-08-19 NOTE — Progress Notes (Signed)
[facility] CSW Progress Notes  Spoke w patient, he is agreeable to a request being made to [facility] Patient Assistance Fund to cover one night hotel stay so he can complete colonoscopy prep.  Request submitted via email to [facility].  Santa Genera, LCSW Clinical Social Worker Phone:  [phone]

## 2021-08-19 NOTE — Progress Notes (Signed)
Patient declined to stay for 30 minute post observation period. Tolerated IV iron infusion with no incident. VSS, ambulatory to lobby.

## 2021-08-21 ENCOUNTER — Other Ambulatory Visit: Payer: Self-pay

## 2021-08-21 DIAGNOSIS — D509 Iron deficiency anemia, unspecified: Secondary | ICD-10-CM

## 2021-08-21 DIAGNOSIS — Z5900 Homelessness unspecified: Secondary | ICD-10-CM

## 2021-08-21 DIAGNOSIS — Z8719 Personal history of other diseases of the digestive system: Secondary | ICD-10-CM

## 2021-08-21 DIAGNOSIS — K449 Diaphragmatic hernia without obstruction or gangrene: Secondary | ICD-10-CM

## 2021-08-21 DIAGNOSIS — Z860100 Personal history of colon polyps, unspecified: Secondary | ICD-10-CM

## 2021-08-21 NOTE — Progress Notes (Signed)
Dr. Derek Mound next available hospital endo date is 11/03/21. Routing this message to Santa Genera, LCSW to determine if we are able to proceed with scheduling on this date. Uncertain if approval is still required before scheduling.

## 2021-08-22 ENCOUNTER — Emergency Department (HOSPITAL_COMMUNITY)
Admission: EM | Admit: 2021-08-22 | Discharge: 2021-08-22 | Disposition: A | Discharge status: Home / Self Care | Payer: Self-pay | Attending: Emergency Medicine | Admitting: Emergency Medicine

## 2021-08-22 ENCOUNTER — Emergency Department (HOSPITAL_COMMUNITY): Payer: Self-pay

## 2021-08-22 ENCOUNTER — Other Ambulatory Visit: Payer: Self-pay

## 2021-08-22 DIAGNOSIS — K449 Diaphragmatic hernia without obstruction or gangrene: Secondary | ICD-10-CM

## 2021-08-22 DIAGNOSIS — Z8719 Personal history of other diseases of the digestive system: Secondary | ICD-10-CM

## 2021-08-22 DIAGNOSIS — R55 Syncope and collapse: Secondary | ICD-10-CM | POA: Insufficient documentation

## 2021-08-22 DIAGNOSIS — D509 Iron deficiency anemia, unspecified: Secondary | ICD-10-CM

## 2021-08-22 DIAGNOSIS — Z7951 Long term (current) use of inhaled steroids: Secondary | ICD-10-CM | POA: Insufficient documentation

## 2021-08-22 DIAGNOSIS — J4521 Mild intermittent asthma with (acute) exacerbation: Secondary | ICD-10-CM | POA: Insufficient documentation

## 2021-08-22 DIAGNOSIS — Z5900 Homelessness unspecified: Secondary | ICD-10-CM

## 2021-08-22 DIAGNOSIS — R079 Chest pain, unspecified: Secondary | ICD-10-CM | POA: Insufficient documentation

## 2021-08-22 DIAGNOSIS — Z86010 Personal history of colonic polyps: Secondary | ICD-10-CM

## 2021-08-22 DIAGNOSIS — R0602 Shortness of breath: Secondary | ICD-10-CM

## 2021-08-22 NOTE — Discharge Instructions (Signed)
There is help if you need it.  Please do not use dirty needles, this could cause you a severe infection to your skin, heart or spinal cord.  This could kill you or leave you permanently disabled.  There was a recent study done at [facility] that showed that the risk of death for someone that had unintentionally overdosed on narcotics was as high as 15% in the next year.  This is much higher than most every other medical condition.  [place] Solution to the Opioid Problem (GCSTOP) Fixed; mobile; peer-based Chase Holleman [phone] [email or web address] Fixed site exchange at [employer], Wednesdays (2-5pm) and Thursdays (4-8pm). [address] [place], [place] [address] Call or text to arrange mobile and peer exchange, Mondays (1-4pm) and Fridays (4-7pm). Serving [place] [email or web address]  Suboxone clinic: [facility] [address] [place], [address]  [facility] [address] [place], [address]  [facility] [address] [place] [address]  

## 2021-08-22 NOTE — Addendum Note (Signed)
Addended by: Peterson Ao, Nikole Swartzentruber J on: 08/22/2021 09:15 AM   Modules accepted: Orders

## 2021-08-22 NOTE — Progress Notes (Signed)
Received notification from Cathlyn Parsons, CRNA. States he has reviewed pt chart and has cleared pt from an anesthesia perspective to proceed with colonoscopy at the [facility] rather than [facility]. Procedure has been canceled at [facility] on 11/03/21. Will proceed as scheduled at [facility] on 09/30/21 @ 130pm. Message sent to precert team to cancel amb referral for 11/03/21 procedure as this is no longer required. A new amb referral has been placed for our precert team to auth procedure scheduled at [facility] on 09/30/21. Routing this to Santa Genera, LCSW to make her aware of these changes to ensure pt has funding in time for 09/30/21 procedure. Pt has also been notified via My Chart about these changes. Attempted to call pt as well. LVM requesting returned call.

## 2021-08-22 NOTE — ED Provider Notes (Signed)
[facility] EMERGENCY DEPARTMENT Provider Note   CSN: [id number] Arrival date & time:        History Chief Complaint  Patient presents with   Drug Overdose    [name] is a 55 y.o. male.  55 yo M with a chief complaints of being found unresponsive at a hotel.  Per EMS the patient was found unresponsive by his friends and they gave him Narcan on which she became responsive again.  He was then brought by EMS here.  Patient does not remember what happened.  He denies any illegal drug use.  Tells me that he had an event recently where he also lost consciousness and was not sure why.  He denies any over-the-counter medications.  Denies any new medications at home.  The history is provided by the patient and the EMS personnel.  Drug Overdose This is a new problem. The current episode started less than 1 hour ago. The problem occurs constantly. The problem has been resolved. Associated symptoms include chest pain. Pertinent negatives include no abdominal pain, no headaches and no shortness of breath. Nothing aggravates the symptoms. Nothing relieves the symptoms. He has tried nothing for the symptoms. The treatment provided no relief.      Past Medical History:  Diagnosis Date   ADD (attention deficit disorder)    ADD (attention deficit disorder)    Anemia, iron deficiency    Asthma    Blood transfusion    Constipation    Depression    Depression    Exertional dyspnea    secondary to anemia   Hiatal hernia    Sleep apnea 01/2018    Patient Active Problem List   Diagnosis Date Noted   Homelessness 03/14/2021   Obese 03/14/2021   Amnesia 02/09/2020   Hypogonadism in male 09/14/2017   OSA (obstructive sleep apnea) 05/29/2014   Iron deficiency anemia 12/22/2013   Fatigue 07/07/2013   Adult ADHD 07/07/2013   Dyspnea 03/01/2012   Depression 01/07/2012   GERD (gastroesophageal reflux disease) 01/07/2012   Mild intermittent asthma with acute  exacerbation 01/07/2012    Past Surgical History:  Procedure Laterality Date   COLONOSCOPY         Family History  Problem Relation Age of Onset   Diabetes Paternal Grandfather    Cancer Neg Hx    Stroke Neg Hx    Breast cancer Neg Hx    Celiac disease Neg Hx    Cirrhosis Neg Hx    Clotting disorder Neg Hx    Colitis Neg Hx    Colon cancer Neg Hx    Colon polyps Neg Hx    Crohn's disease Neg Hx    Cystic fibrosis Neg Hx    Esophageal cancer Neg Hx    Heart disease Neg Hx    Hemochromatosis Neg Hx    Inflammatory bowel disease Neg Hx    Irritable bowel syndrome Neg Hx    Kidney disease Neg Hx    Liver cancer Neg Hx    Liver disease Neg Hx    Ovarian cancer Neg Hx    Pancreatic cancer Neg Hx    Prostate cancer Neg Hx    Rectal cancer Neg Hx    Stomach cancer Neg Hx    Ulcerative colitis Neg Hx    Uterine cancer Neg Hx    Wilson's disease Neg Hx     Social History   Tobacco Use   Smoking status: Never   Smokeless tobacco:  Never  Vaping Use   Vaping Use: Never used  Substance Use Topics   Alcohol use: No   Drug use: No    Home Medications Prior to Admission medications   Medication Sig Start Date End Date Taking? Authorizing Provider  albuterol (VENTOLIN HFA) 108 (90 Base) MCG/ACT inhaler Inhale 1-2 puffs into the lungs every 6 (six) hours as needed for wheezing or shortness of breath. 03/14/21   [name], MD  fluticasone furoate-vilanterol (BREO ELLIPTA) 100-25 MCG/INH AEPB Inhale 1 puff into the lungs daily. 03/14/21   [name], MD    Allergies    Patient has no known allergies.  Review of Systems   Review of Systems  Constitutional:  Negative for chills and fever.  HENT:  Negative for congestion and facial swelling.   Eyes:  Negative for discharge and visual disturbance.  Respiratory:  Negative for shortness of breath.   Cardiovascular:  Positive for chest pain. Negative for palpitations.  Gastrointestinal:  Negative for abdominal  pain, diarrhea and vomiting.  Musculoskeletal:  Negative for arthralgias and myalgias.  Skin:  Negative for color change and rash.  Neurological:  Negative for tremors, syncope and headaches.  Psychiatric/Behavioral:  Negative for confusion and dysphoric mood.    Physical Exam Updated Vital Signs BP (!) 160/102 (BP Location: Right Arm)    Pulse 91    Temp 98 F (36.7 C) (Oral)    Resp 20    SpO2 100%   Physical Exam Vitals and nursing note reviewed.  Constitutional:      Appearance: He is well-developed.  HENT:     Head: Normocephalic and atraumatic.  Eyes:     Pupils: Pupils are equal, round, and reactive to light.  Neck:     Vascular: No JVD.  Cardiovascular:     Rate and Rhythm: Normal rate and regular rhythm.     Heart sounds: No murmur heard.   No friction rub. No gallop.  Pulmonary:     Effort: No respiratory distress.     Breath sounds: No wheezing.  Abdominal:     General: There is no distension.     Tenderness: There is no abdominal tenderness. There is no guarding or rebound.  Musculoskeletal:        General: Tenderness present. Normal range of motion.     Cervical back: Normal range of motion and neck supple.     Comments: Pain about the sternum, reproduces symptoms  Skin:    Coloration: Skin is not pale.     Findings: No rash.  Neurological:     Mental Status: He is alert and oriented to person, place, and time.  Psychiatric:        Behavior: Behavior normal.    ED Results / Procedures / Treatments   Labs (all labs ordered are listed, but only abnormal results are displayed) Labs Reviewed - No data to display  EKG None  Radiology DG Chest 1 View  Result Date: 08/22/2021 CLINICAL DATA:  Unspecified drug overdose. EXAM: CHEST  1 VIEW COMPARISON:  PA Lat 02/18/2019. FINDINGS: The heart silhouette is moderately enlarged but somewhat exaggerated by bilateral epipericardial fat pads. The lungs clear of infiltrates with increased linear atelectasis or  scarring lateral left base. No pleural effusion is seen. No vascular congestion is seen. Stable mediastinum with aortic tortuosity, hiatal hernia. Thoracic cage grossly intact. IMPRESSION: Cardiomegaly with no evidence of acute chest disease. Electronically Signed   By: Almira Bar M.D.   On: 08/22/2021  22:50    Procedures Procedures   Medications Ordered in ED Medications - No data to display  ED Course  I have reviewed the triage vital signs and the nursing notes.  Pertinent labs & imaging results that were available during my care of the patient were reviewed by me and considered in my medical decision making (see chart for details).    MDM Rules/Calculators/A&P                         55 yo M with a chief complaints of event where he was found unresponsive.  Reportedly he was given Narcan on the scene with improvement.  This was even before EMS arrived.  He has no remembrance of the event.  Denies illegal drug use.  Complaining of some mild chest discomfort as him talking with him that is reproduced on palpation.  We will obtain a chest x-ray EKG.  Patient continues to be largely asymptomatic chest x-ray viewed by me without focal infiltrate.  EKG without concerning arrhythmia or ischemia.  D/c home.   3:04 PM:  I have discussed the diagnosis/risks/treatment options with the patient and believe the pt to be eligible for discharge home to follow-up with PCP. We also discussed returning to the ED immediately if new or worsening sx occur. We discussed the sx which are most concerning (e.g., sudden worsening pain, fever, inability to tolerate by mouth) that necessitate immediate return. Medications administered to the patient during their visit and any new prescriptions provided to the patient are listed below.  Medications given during this visit Medications - No data to display   The patient appears reasonably screen and/or stabilized for discharge and I doubt any other medical  condition or other [facility] requiring further screening, evaluation, or treatment in the ED at this time prior to discharge.      Final Clinical Impression(s) / ED Diagnoses Final diagnoses:  Syncope and collapse    Rx / DC Orders ED Discharge Orders     None        Deno Etienne, DO 08/23/21 1504

## 2021-08-22 NOTE — ED Triage Notes (Signed)
Pt bib [facility] from hotel after overdosing on an unknown substance. Friends administered 28mg  Narcan IM, pt became alert and oriented about 5 minutes later. 18G LAC placed PTA. 4mg  Zofran administered for nausea  Vitals PTA BP 178/112, HR 96, O2 97%, CBG 204

## 2021-08-22 NOTE — ED Notes (Signed)
RN reviewed discharge instructions with pt. Pt verbalized understanding and had no further questions. VSS upon discharge.  

## 2021-08-26 ENCOUNTER — Ambulatory Visit (HOSPITAL_BASED_OUTPATIENT_CLINIC_OR_DEPARTMENT_OTHER): Payer: Self-pay | Admitting: Physician Assistant

## 2021-08-26 ENCOUNTER — Other Ambulatory Visit: Payer: Self-pay

## 2021-08-26 ENCOUNTER — Inpatient Hospital Stay: Payer: Self-pay

## 2021-08-26 VITALS — BP 140/89 | HR 105 | Temp 98.7°F | Resp 20

## 2021-08-26 DIAGNOSIS — R0789 Other chest pain: Secondary | ICD-10-CM

## 2021-08-26 DIAGNOSIS — R5382 Chronic fatigue, unspecified: Secondary | ICD-10-CM

## 2021-08-26 DIAGNOSIS — D508 Other iron deficiency anemias: Secondary | ICD-10-CM

## 2021-08-26 MED ORDER — SODIUM CHLORIDE 0.9 % IV SOLN: Freq: Once | INTRAVENOUS | Status: AC

## 2021-08-26 MED ORDER — SODIUM CHLORIDE 0.9 % IV SOLN
200.0000 mg | Freq: Once | INTRAVENOUS | Status: AC
  Administered 2021-08-26: 15:00:00 200 mg via INTRAVENOUS
  Filled 2021-08-26: qty 200

## 2021-08-26 MED ORDER — LIDOCAINE 5 % EX PTCH
1.0000 | MEDICATED_PATCH | Freq: Once | CUTANEOUS | Status: DC
  Administered 2021-08-26: 15:00:00 1 via TRANSDERMAL
  Filled 2021-08-26: qty 1

## 2021-08-26 MED ORDER — LIDOCAINE 5 % EX PTCH
1.0000 | MEDICATED_PATCH | CUTANEOUS | Status: DC
  Filled 2021-08-26: qty 1

## 2021-08-26 NOTE — Patient Instructions (Signed)

## 2021-08-26 NOTE — Progress Notes (Signed)
Symptom Management Consult note [place]    Patient Care Team: Kinnie Feil, MD as PCP - General (Family Medicine)    Name of the patient: [name]  EI:[id number]  1966/06/21   Date of visit: 08/26/2021    Chief complaint/ Reason for visit- chest pain  Oncology History   No history exists.    Current Therapy: Iron infusions  Interval history-[name] is a 55 yo male with longstanding history of IDA seen in infusion center today with complaint of chest pain x4 days.  Patient states the pain started after he had CPR performed on him by a bystander.  He was evaluated in the ED and discharged home.  Patient states the pain is located in the center of his chest and radiates to his ribs.  Pain is worse when coughing.  He has been taking Advil with temporary symptom improvement he states.  Patient denies history of similar pain.  He denies pain being worse with activity and denies any cardiac history.  He denies any history of drug or alcohol use.  Denies weight loss, fever, chills, hemoptysis, hematemesis, back pain, abdominal pain, nausea, vomiting, urinary symptoms, diarrhea, leg swelling.  Patient states he recently saw GI and is supposed to undergo an EGD and colonoscopy however this had to be rescheduled because he is currently homeless.  Social work is attempting to find him a place to perform prep      ROS  All other systems are reviewed and are negative for acute change except as noted in the HPI.    No Known Allergies   Past Medical History:  Diagnosis Date   ADD (attention deficit disorder)    ADD (attention deficit disorder)    Anemia, iron deficiency    Asthma    Blood transfusion    Constipation    Depression    Depression    Exertional dyspnea    secondary to anemia   Hiatal hernia    Sleep apnea 01/2018     Past Surgical History:  Procedure Laterality Date   COLONOSCOPY      Social History   Socioeconomic  History   Marital status: Single    Spouse name: Not on file   Number of children: 0   Years of education: Not on file   Highest education level: Not on file  Occupational History   Occupation: student-Business  Tobacco Use   Smoking status: Never   Smokeless tobacco: Never  Vaping Use   Vaping Use: Never used  Substance and Sexual Activity   Alcohol use: No   Drug use: No   Sexual activity: Not Currently  Other Topics Concern   Not on file  Social History Narrative   Lives alone. Never married, no children.  Has two sister who live in [place].  Student at work study.  [place].  Working toward Engineer, technical sales.  Previously worked in Engineer, manufacturing systems.  Was also previously homeless due to job loss and psychiatric exacerbation.   Social Determinants of Health   Financial Resource Strain: High Risk   Difficulty of Paying Living Expenses: Hard  Food Insecurity: Food Insecurity Present   Worried About Running Out of Food in the Last Year: Sometimes true   Ran Out of Food in the Last Year: Sometimes true  Transportation Needs: Unmet Transportation Needs   Lack of Transportation (Medical): Yes   Lack of Transportation (Non-Medical): Yes  Physical Activity: Not on file  Stress: Not on file  Social Connections: Not on file  Intimate Partner Violence: Not on file    Family History  Problem Relation Age of Onset   Diabetes Paternal Grandfather    Cancer Neg Hx    Stroke Neg Hx    Breast cancer Neg Hx    Celiac disease Neg Hx    Cirrhosis Neg Hx    Clotting disorder Neg Hx    Colitis Neg Hx    Colon cancer Neg Hx    Colon polyps Neg Hx    Crohn's disease Neg Hx    Cystic fibrosis Neg Hx    Esophageal cancer Neg Hx    Heart disease Neg Hx    Hemochromatosis Neg Hx    Inflammatory bowel disease Neg Hx    Irritable bowel syndrome Neg Hx    Kidney disease Neg Hx    Liver cancer Neg Hx    Liver disease Neg Hx    Ovarian cancer Neg Hx    Pancreatic  cancer Neg Hx    Prostate cancer Neg Hx    Rectal cancer Neg Hx    Stomach cancer Neg Hx    Ulcerative colitis Neg Hx    Uterine cancer Neg Hx    Wilson's disease Neg Hx      Current Outpatient Medications:    albuterol (VENTOLIN HFA) 108 (90 Base) MCG/ACT inhaler, Inhale 1-2 puffs into the lungs every 6 (six) hours as needed for wheezing or shortness of breath., Disp: 18 g, Rfl: 1   fluticasone furoate-vilanterol (BREO ELLIPTA) 100-25 MCG/INH AEPB, Inhale 1 puff into the lungs daily., Disp: 1 each, Rfl: 0  PHYSICAL EXAM: ECOG FS:1 - Symptomatic but completely ambulatory   BP: 134/82    HR: 102   O2: 95% on room air   Resp: 18 Physical Exam Vitals and nursing note reviewed.  Constitutional:      Appearance: He is well-developed. He is not ill-appearing or toxic-appearing.  HENT:     Head: Normocephalic and atraumatic.     Nose: Nose normal.  Eyes:     General: No scleral icterus.       Right eye: No discharge.        Left eye: No discharge.     Conjunctiva/sclera: Conjunctivae normal.  Neck:     Vascular: No JVD.  Cardiovascular:     Rate and Rhythm: Regular rhythm. Tachycardia present.     Pulses: Normal pulses.     Heart sounds: Normal heart sounds.     Comments: Heart rate ranging from 99-1 02 during exam Pulmonary:     Effort: Pulmonary effort is normal.     Breath sounds: Normal breath sounds.     Comments: Oxygen saturation is 100% on room air.  Patient speaking full sentences.  No respiratory distress. Chest:     Comments: Tender to palpation of sternum.  No evidence of flail chest on exam.  No crepitus or deformity appreciated. Abdominal:     General: There is no distension.  Musculoskeletal:        General: Normal range of motion.     Cervical back: Normal range of motion.  Skin:    General: Skin is warm and dry.  Neurological:     Mental Status: He is oriented to person, place, and time.     GCS: GCS eye subscore is 4. GCS verbal subscore is 5. GCS motor  subscore is 6.     Comments: Fluent speech, no facial droop.  Psychiatric:        Behavior: Behavior normal.       LABORATORY DATA: I have reviewed the data as listed CBC Latest Ref Rng & Units 07/15/2021 05/23/2021 05/07/2021  WBC 4.0 - 10.5 K/uL 11.5(H) 7.6 9.6  Hemoglobin 13.0 - 17.0 g/dL 0.9(B) 7.1(L) 7.5(L)  Hematocrit 39.0 - 52.0 % 33.4(L) 24.9(L) 26.1(L)  Platelets 150 - 400 K/uL 404(H) 441(H) 461(H)     CMP Latest Ref Rng & Units 05/07/2021 03/14/2021 09/14/2017  Glucose 70 - 99 mg/dL 98 98 99  BUN 6 - 20 mg/dL 35(H) 18 14  Creatinine 0.61 - 1.24 mg/dL 2.99 2.42 6.83  Sodium 135 - 145 mmol/L 140 139 139  Potassium 3.5 - 5.1 mmol/L 4.6 5.5(H) 4.5  Chloride 98 - 111 mmol/L 106 102 104  CO2 22 - 32 mmol/L 25 25 22   Calcium 8.9 - 10.3 mg/dL 9.2 9.4 9.3  Total Protein 6.5 - 8.1 g/dL 7.1 - 7.0  Total Bilirubin 0.3 - 1.2 mg/dL ) - <4.1(D  Alkaline Phos 38 - 126 U/L 81 - 87  AST 15 - 41 U/L 33 - 16  ALT 0 - 44 U/L 60(H) - 12       RADIOGRAPHIC STUDIES: I have personally reviewed the radiological images as listed and agreed with the findings in the report. No images are attached to the encounter. DG Chest 1 View  Result Date: 08/22/2021 CLINICAL DATA:  Unspecified drug overdose. EXAM: CHEST  1 VIEW COMPARISON:  PA Lat 02/18/2019. FINDINGS: The heart silhouette is moderately enlarged but somewhat exaggerated by bilateral epipericardial fat pads. The lungs clear of infiltrates with increased linear atelectasis or scarring lateral left base. No pleural effusion is seen. No vascular congestion is seen. Stable mediastinum with aortic tortuosity, hiatal hernia. Thoracic cage grossly intact. IMPRESSION: Cardiomegaly with no evidence of acute chest disease. Electronically Signed   By: 02/20/2019 M.D.   On: 08/22/2021 22:50     ASSESSMENT & PLAN: Patient is a 55 y.o. male with history of IDA currently receiving IV venofer infusions followed by 59 PA-C.  #)Chest wall  pain- Patient's pain likely related to recent CPR performed by bystander. Low suspicion for ACS or PE based on his descriptions. He denies history of frequent blood transfusions and denies any new fatigue. Exam without signs of trauma and he has clear lung sounds in all fields.  I viewed patient's recent ED visit and personally interpreted his chest x-ray.  I do not see any signs of pneumothorax or rib fracture.  Discussed this with patient and had a lengthy discussion about treatment of rib contusion.  Informed patient it is possible for there to be an occult fracture however intervention will be the same for pain as no traumatic findings on exam and he has reassuring vital signs.  Patient given lidocaine patch here.  Patient also given Tylenol and incentive spirometer with encouragement to take deep breaths.  Discussed treating pain with Tylenol and Motrin for pain at home.  Discussed ED precautions with patient should new symptoms develop or worsen.  He is agreeable with plan of care.   Visit Diagnosis: 1. Chest wall pain      No orders of the defined types were placed in this encounter.   All questions were answered. The patient knows to call the clinic with any problems, questions or concerns. No barriers to learning was detected.  I have spent a total of 20 minutes minutes of face-to-face and non-face-to-face time,  preparing to see the patient, obtaining and/or reviewing separately obtained history, performing a medically appropriate examination, counseling and educating the patient, ordering tests,  documenting clinical information in the electronic health record, and care coordination.     Thank you for allowing me to participate in the care of this patient.    Barrie Folk, PA-C Department of Hematology/Oncology [facility] at [facility] Phone: [phone]  [phone]    08/27/2021 10:08 AM

## 2021-08-27 ENCOUNTER — Encounter: Payer: Self-pay | Admitting: Physician Assistant

## 2021-08-27 NOTE — Progress Notes (Signed)
Pt seen by this nurse in infusion for scheduled iron. Pt states he was in significant pain r/t ED visit over the weekend. Pt seen in infusion by symptom management PA. See MAR for details. VSS and infusion completed. Pt ambulated to lobby in stable condition.

## 2021-08-29 ENCOUNTER — Encounter: Payer: Self-pay | Admitting: General Practice

## 2021-08-29 NOTE — Progress Notes (Signed)
[facility] CSW Progress Notes  Email from [facility] Patient [employer].  Patient has been approved for one night hotel stay in order to complete colonoscopy prep.  [facility] scheduler notified by SM, CSW L Somers notified for direction on how to proceed now that funding has been approved.  Santa Genera, LCSW Clinical Social Worker Phone:  [phone]

## 2021-09-11 ENCOUNTER — Telehealth: Payer: Self-pay | Admitting: General Practice

## 2021-09-11 NOTE — Telephone Encounter (Signed)
[facility] CSW Progress Notes  Call to patient to inform him that [facility] Patient Assistance Effie Shy has approved one night hotel stay payment so he can complete colonoscopy prep as required for procedure.  CSW Polo Riley will arrange the hotel - patient was asked to call her one week prior to procedure [phone]) in order to find out the details of hotel arrangements.  Santa Genera, LCSW Clinical Social Worker Phone:  [phone]

## 2021-09-22 ENCOUNTER — Telehealth: Payer: Self-pay | Admitting: General Practice

## 2021-09-22 NOTE — Telephone Encounter (Signed)
[facility] CSW Progress Notes  Call to patient, no answer, left VM.  Informed patient that undersigned CSW is retiring, he needs to follow up w CSW Polo Riley for details on hotel room that will be reserved for him in order to complete prep for upcoming colonoscopy on 09/30/21.  Stressed that he needs to contact CSW Somers at [phone] for further details on hotel room.  Also informed scheduler at [facility] of this.  Undersigned CSW signing off at this time.  Santa Genera, LCSW Clinical Social Worker Phone:  [phone]

## 2021-09-29 ENCOUNTER — Other Ambulatory Visit: Payer: Self-pay | Admitting: Physician Assistant

## 2021-09-29 DIAGNOSIS — D508 Other iron deficiency anemias: Secondary | ICD-10-CM

## 2021-09-29 NOTE — Progress Notes (Signed)
..  Patient Assist/Replace for the following has been terminated. Medication: Venofer Reason for Termination: Program expired on 08/24/2021, patient was not able to provide proof of seeking any type of insurance coverage.  Last DOS: 08/19/2021.  Marland KitchenDarlyne Russian, CPhT IV Drug Replacement Specialist [name] Phone: [phone]

## 2021-09-30 ENCOUNTER — Inpatient Hospital Stay (HOSPITAL_BASED_OUTPATIENT_CLINIC_OR_DEPARTMENT_OTHER): Payer: Self-pay | Admitting: Physician Assistant

## 2021-09-30 ENCOUNTER — Encounter: Payer: Self-pay | Admitting: Internal Medicine

## 2021-09-30 ENCOUNTER — Inpatient Hospital Stay: Payer: Self-pay | Attending: Hematology and Oncology

## 2021-09-30 ENCOUNTER — Other Ambulatory Visit: Payer: Self-pay

## 2021-09-30 ENCOUNTER — Inpatient Hospital Stay: Payer: Self-pay

## 2021-09-30 VITALS — BP 153/83 | HR 104 | Temp 100.2°F | Resp 19 | Ht 71.0 in | Wt 299.3 lb

## 2021-09-30 DIAGNOSIS — R071 Chest pain on breathing: Secondary | ICD-10-CM

## 2021-09-30 DIAGNOSIS — D508 Other iron deficiency anemias: Secondary | ICD-10-CM

## 2021-09-30 DIAGNOSIS — R0789 Other chest pain: Secondary | ICD-10-CM

## 2021-09-30 DIAGNOSIS — R079 Chest pain, unspecified: Secondary | ICD-10-CM | POA: Insufficient documentation

## 2021-09-30 DIAGNOSIS — D509 Iron deficiency anemia, unspecified: Secondary | ICD-10-CM | POA: Insufficient documentation

## 2021-09-30 LAB — CBC WITH DIFFERENTIAL (CANCER CENTER ONLY)
Abs Immature Granulocytes: 0.04 10*3/uL (ref 0.00–0.07)
Basophils Absolute: 0.1 10*3/uL (ref 0.0–0.1)
Basophils Relative: 1 %
Eosinophils Absolute: 0.7 10*3/uL — ABNORMAL HIGH (ref 0.0–0.5)
Eosinophils Relative: 6 %
HCT: 25.4 % — ABNORMAL LOW (ref 39.0–52.0)
Hemoglobin: 7.5 g/dL — ABNORMAL LOW (ref 13.0–17.0)
Immature Granulocytes: 0 %
Lymphocytes Relative: 6 %
Lymphs Abs: 0.7 10*3/uL (ref 0.7–4.0)
MCH: 22.3 pg — ABNORMAL LOW (ref 26.0–34.0)
MCHC: 29.5 g/dL — ABNORMAL LOW (ref 30.0–36.0)
MCV: 75.4 fL — ABNORMAL LOW (ref 80.0–100.0)
Monocytes Absolute: 0.7 10*3/uL (ref 0.1–1.0)
Monocytes Relative: 6 %
Neutro Abs: 9.3 10*3/uL — ABNORMAL HIGH (ref 1.7–7.7)
Neutrophils Relative %: 81 %
Platelet Count: 374 10*3/uL (ref 150–400)
RBC: 3.37 MIL/uL — ABNORMAL LOW (ref 4.22–5.81)
RDW: 17.8 % — ABNORMAL HIGH (ref 11.5–15.5)
WBC Count: 11.5 10*3/uL — ABNORMAL HIGH (ref 4.0–10.5)
nRBC: 0 % (ref 0.0–0.2)

## 2021-09-30 LAB — CMP (CANCER CENTER ONLY)
ALT: 9 U/L (ref 0–44)
AST: 11 U/L — ABNORMAL LOW (ref 15–41)
Albumin: 3.7 g/dL (ref 3.5–5.0)
Alkaline Phosphatase: 93 U/L (ref 38–126)
Anion gap: 6 (ref 5–15)
BUN: 24 mg/dL — ABNORMAL HIGH (ref 6–20)
CO2: 23 mmol/L (ref 22–32)
Calcium: 8.6 mg/dL — ABNORMAL LOW (ref 8.9–10.3)
Chloride: 110 mmol/L (ref 98–111)
Creatinine: 1.27 mg/dL — ABNORMAL HIGH (ref 0.61–1.24)
GFR, Estimated: >60 mL/min (ref >60)
Glucose, Bld: 114 mg/dL — ABNORMAL HIGH (ref 70–99)
Potassium: 4.4 mmol/L (ref 3.5–5.1)
Sodium: 139 mmol/L (ref 135–145)
Total Bilirubin: 0.2 mg/dL — ABNORMAL LOW (ref 0.3–1.2)
Total Protein: 6.7 g/dL (ref 6.5–8.1)

## 2021-09-30 LAB — IRON AND IRON BINDING CAPACITY (CC-WL,HP ONLY)
Iron: 23 ug/dL — ABNORMAL LOW (ref 45–182)
Saturation Ratios: 5 % — ABNORMAL LOW (ref 17.9–39.5)
TIBC: 465 ug/dL — ABNORMAL HIGH (ref 250–450)
UIBC: 442 ug/dL — ABNORMAL HIGH (ref 117–376)

## 2021-09-30 LAB — PREPARE RBC (CROSSMATCH)

## 2021-09-30 LAB — FERRITIN: Ferritin: 5 ng/mL — ABNORMAL LOW (ref 24–336)

## 2021-09-30 MED ORDER — ACETAMINOPHEN 325 MG PO TABS: 650.0000 mg | ORAL_TABLET | Freq: Once | ORAL | Status: DC

## 2021-09-30 NOTE — Progress Notes (Signed)
[place] Telephone:([phone]) [phone]   [phone]) [phone]  PROGRESS NOTE  Patient Care Team: Kinnie Feil, MD as PCP - General (Family Medicine)  Hematological/Oncological History 1) 03/14/2021: TIBC 349, Iron 30 (L), Iron saturation 9% (L), Ferritin 15 (L), Vitamin B12 537, Folate 7.4, WBC 8.6, Hgb 10.7 (L), MCV 74 (L), Plt 450.   2) 05/07/2021: Establish care with Dede Query PA-C  3) 05/26/2021-06/20/2021: Received IV venofer 200 mg once a week x 5 doses  4) 07/29/2021-08/26/2021: Received IV venofer 200 mg once a week x 5 doses  CHIEF COMPLAINTS/PURPOSE OF CONSULTATION:  Iron deficiency anemia  HISTORY OF PRESENTING ILLNESS:  [name] 56 y.o. male returns for follow-up for iron deficiency anemia.  Since the last visit on 07/15/2021, patient received IV Venofer x5 doses.  He was scheduled for EGD/colonoscopy today with Dr. Lorenso Courier at [facility]. He has to cancel the endoscopic procedures due to inability to find a place for residence for his bowel prep.   On exam today, Mr. Calafiore reports persistent fatigue that prevents him from completing his daily activities.  He continues to gain weight, another 10 pounds since the last visit in November.  He is uncertain the underlying cause but contributes this to eating an unhealthy diet and inactivity.  He reports occasional episodes of nausea but denies any vomiting episodes.  Patient's bowel habits are unchanged without any diarrhea or constipation.  He denies any signs of easy bruising or signs of active bleeding.  This includes hematochezia, melena, hemoptysis, hematuria, epistaxis or gingival bleeding.  Patient continues to have central chest pain that radiates to his ribs that has been present for the last month.  He rates the pain as 7 out of 10 on a pain scale. He reports the pain is worse when he coughs.  He has shortness of breath mainly with exertion.  Additionally, patient reports some swelling in the left axillary  region without a palpable mass.He denies any fevers, chills, night sweats, dizziness, syncopal episodes, rash or other skin changes. He has no other complaints. Rest of the 10 point ROS is below.   MEDICAL HISTORY:  Past Medical History:  Diagnosis Date   ADD (attention deficit disorder)    ADD (attention deficit disorder)    Anemia, iron deficiency    Asthma    Blood transfusion    Constipation    Depression    Depression    Exertional dyspnea    secondary to anemia   Hiatal hernia    Sleep apnea 01/2018    SURGICAL HISTORY: Past Surgical History:  Procedure Laterality Date   COLONOSCOPY      SOCIAL HISTORY: Social History   Socioeconomic History   Marital status: Single    Spouse name: Not on file   Number of children: 0   Years of education: Not on file   Highest education level: Not on file  Occupational History   Occupation: student-Business  Tobacco Use   Smoking status: Never   Smokeless tobacco: Never  Vaping Use   Vaping Use: Never used  Substance and Sexual Activity   Alcohol use: No   Drug use: No   Sexual activity: Not Currently  Other Topics Concern   Not on file  Social History Narrative   Lives alone. Never married, no children.  Has two sister who live in [place].  Student at work study.  [place].  Working toward Engineer, technical sales.  Previously worked in Engineer, manufacturing systems.  Was also  previously homeless due to job loss and psychiatric exacerbation.   Social Determinants of Health   Financial Resource Strain: High Risk   Difficulty of Paying Living Expenses: Hard  Food Insecurity: Food Insecurity Present   Worried About Charity fundraiser in the Last Year: Sometimes true   Arboriculturist in the Last Year: Sometimes true  Transportation Needs: Public librarian (Medical): Yes   Lack of Transportation (Non-Medical): Yes  Physical Activity: Not on file  Stress: Not on file  Social  Connections: Not on file  Intimate Partner Violence: Not on file    FAMILY HISTORY: Family History  Problem Relation Age of Onset   Diabetes Paternal Grandfather    Cancer Neg Hx    Stroke Neg Hx    Breast cancer Neg Hx    Celiac disease Neg Hx    Cirrhosis Neg Hx    Clotting disorder Neg Hx    Colitis Neg Hx    Colon cancer Neg Hx    Colon polyps Neg Hx    Crohn's disease Neg Hx    Cystic fibrosis Neg Hx    Esophageal cancer Neg Hx    Heart disease Neg Hx    Hemochromatosis Neg Hx    Inflammatory bowel disease Neg Hx    Irritable bowel syndrome Neg Hx    Kidney disease Neg Hx    Liver cancer Neg Hx    Liver disease Neg Hx    Ovarian cancer Neg Hx    Pancreatic cancer Neg Hx    Prostate cancer Neg Hx    Rectal cancer Neg Hx    Stomach cancer Neg Hx    Ulcerative colitis Neg Hx    Uterine cancer Neg Hx    Wilson's disease Neg Hx     ALLERGIES:  has No Known Allergies.  MEDICATIONS:  Current Outpatient Medications  Medication Sig Dispense Refill   albuterol (VENTOLIN HFA) 108 (90 Base) MCG/ACT inhaler Inhale 1-2 puffs into the lungs every 6 (six) hours as needed for wheezing or shortness of breath. 18 g 1   fluticasone furoate-vilanterol (BREO ELLIPTA) 100-25 MCG/INH AEPB Inhale 1 puff into the lungs daily. 1 each 0   No current facility-administered medications for this visit.    REVIEW OF SYSTEMS:   Constitutional: ( - ) fevers, ( - )  chills , ( - ) night sweats Eyes: ( - ) blurriness of vision, ( - ) double vision, ( - ) watery eyes Ears, nose, mouth, throat, and face: ( - ) mucositis, ( - ) sore throat Respiratory: ( + ) cough, ( + ) dyspnea, ( - ) wheezes Cardiovascular: ( - ) palpitation, ( + ) chest discomfort, ( - ) lower extremity swelling Gastrointestinal:  ( + ) nausea, ( - ) heartburn, ( - ) change in bowel habits Skin: ( - ) abnormal skin rashes Lymphatics: ( - ) new lymphadenopathy, ( - ) easy bruising Neurological: ( - ) numbness, ( - ) tingling,  ( - ) new weaknesses Behavioral/Psych: ( - ) mood change, ( - ) new changes  All other systems were reviewed with the patient and are negative.  PHYSICAL EXAMINATION: ECOG PERFORMANCE STATUS: 2 - Symptomatic, <50% confined to bed  Vitals:   09/30/21 0829  BP: (!) 153/83  Pulse: (!) 104  Resp: 19  Temp: 100.2 F (37.9 C)  SpO2: 100%   Filed Weights   09/30/21 0829  Weight: 299 lb  4.8 oz (135.8 kg)    GENERAL: well appearing male in NAD, obese SKIN: skin color, texture, turgor are normal, no rashes or significant lesions EYES: conjunctiva are pink and non-injected, sclera clear OROPHARYNX: no exudate, no erythema; lips, buccal mucosa, and tongue normal  NECK: supple, non-tender LYMPH:  no palpable lymphadenopathy in the cervical or supraclavicular lymph nodes.  LUNGS: clear to auscultation and percussion with normal breathing effort HEART:  Tachycardic.Regular rhythm and no murmurs and no lower extremity edema. CHEST: Tenderness to palpation in sternum and left lateral ribs/inferior axillary region.  ABDOMEN: soft, non-tender, non-distended, normal bowel sounds Musculoskeletal: no cyanosis of digits and no clubbing  PSYCH: alert & oriented x 3, fluent speech NEURO: no focal motor/sensory deficits  LABORATORY DATA:  I have reviewed the data as listed CBC Latest Ref Rng & Units 09/30/2021 07/15/2021 05/23/2021  WBC 4.0 - 10.5 K/uL 11.5(H) 11.5(H) 7.6  Hemoglobin 13.0 - 17.0 g/dL 7.5(L) 9.8(L) 7.1(L)  Hematocrit 39.0 - 52.0 % 25.4(L) 33.4(L) 24.9(L)  Platelets 150 - 400 K/uL 374 404(H) 441(H)    CMP Latest Ref Rng & Units 09/30/2021 05/07/2021 03/14/2021  Glucose 70 - 99 mg/dL 114(H) 98 98  BUN 6 - 20 mg/dL 24(H) 24(H) 18  Creatinine 0.61 - 1.24 mg/dL 1.27(H) 1.11 1.05  Sodium 135 - 145 mmol/L 139 140 139  Potassium 3.5 - 5.1 mmol/L 4.4 4.6 5.5(H)  Chloride 98 - 111 mmol/L 110 106 102  CO2 22 - 32 mmol/L 23 25 25   Calcium 8.9 - 10.3 mg/dL 8.6(L) 9.2 9.4  Total Protein 6.5 -  8.1 g/dL 6.7 7.1 -  Total Bilirubin 0.3 - 1.2 mg/dL 0.2(L) <0.2(L) -  Alkaline Phos 38 - 126 U/L 93 81 -  AST 15 - 41 U/L 11(L) 33 -  ALT 0 - 44 U/L 9 60(H) -    ASSESSMENT & PLAN JHADEN DROTAR is a 56 y.o. male returns for follow-up for iron deficiency anemia.  #Iron deficiency anemia: --Etiology unknown, he denies easy bruising or signs of bleeding. Prior cause was secondary to significant gastritis and esophagitis. --Established care with Dr. [name] at [facility]. He was scheduled for EGD/Colonoscopy today but unable to make procedure since he did not complete his bowel prep. Need to reschedule ASAP. --Patient is unable to tolerate oral iron supplementation due to constipation.   --Patient received another round of IV Venofer x5 doses from 07/29/2021-08/26/2021 --Labs today show worsening anemia Hgb 7.5, MCV 75.4. Iron panel shows deficiency with serum iron 23, TIBC 465, saturation 5%, ferritin 5.  --We will arrange for 1 unit of PRBC tomorrow. Patient assistance for IV venofer expired last month. We will request authorization for IV monoferric.  --RTC in two weeks with labs and f/u visit in 4 weeks  #Chest pain: --Uncertain etiology --Chest xray from 08/22/2021 showed no acute process.  --Due to persistent chest pain and new fever of 100.2 F, we recommend a STAT CT chest scan.  --Patient was advised to go to the ED if chest pain or SOB worsens.    Orders Placed This Encounter  Procedures   CT Chest W Contrast    Standing Status:   Future    Standing Expiration Date:   09/30/2022    Order Specific Question:   If indicated for the ordered procedure, I authorize the administration of contrast media per Radiology protocol    Answer:   Yes    Order Specific Question:   Preferred imaging location?    Answer:  [facility]     All questions were answered. The patient knows to call the clinic with any problems, questions or concerns.  I have spent a total of 30 minutes  minutes of face-to-face and non-face-to-face time, preparing to see the patient, performing a medically appropriate examination, counseling and educating the patient, ordering medications/tests, communicating with other health care professionals, documenting clinical information in the electronic health record, and care coordination.   Dede Query, PA-C Department of Hematology/Oncology [place] at [facility] Phone: [phone]

## 2021-10-01 ENCOUNTER — Inpatient Hospital Stay: Payer: Self-pay

## 2021-10-01 ENCOUNTER — Other Ambulatory Visit: Payer: Self-pay | Admitting: Physician Assistant

## 2021-10-01 ENCOUNTER — Telehealth: Payer: Self-pay | Admitting: Physician Assistant

## 2021-10-01 ENCOUNTER — Ambulatory Visit (HOSPITAL_COMMUNITY)
Admission: RE | Admit: 2021-10-01 | Discharge: 2021-10-01 | Disposition: A | Discharge status: Home / Self Care | Payer: Self-pay | Source: Ambulatory Visit | Attending: Physician Assistant | Admitting: Physician Assistant

## 2021-10-01 ENCOUNTER — Observation Stay (HOSPITAL_COMMUNITY): Admission: AD | Admit: 2021-10-01 | Payer: Self-pay | Source: Ambulatory Visit | Admitting: Internal Medicine

## 2021-10-01 ENCOUNTER — Telehealth: Payer: Self-pay | Admitting: Hematology and Oncology

## 2021-10-01 DIAGNOSIS — D508 Other iron deficiency anemias: Secondary | ICD-10-CM

## 2021-10-01 DIAGNOSIS — R071 Chest pain on breathing: Secondary | ICD-10-CM | POA: Insufficient documentation

## 2021-10-01 DIAGNOSIS — R0789 Other chest pain: Secondary | ICD-10-CM | POA: Insufficient documentation

## 2021-10-01 DIAGNOSIS — N289 Disorder of kidney and ureter, unspecified: Secondary | ICD-10-CM

## 2021-10-01 MED ORDER — SODIUM CHLORIDE 0.9% IV SOLUTION
250.0000 mL | Freq: Once | INTRAVENOUS | Status: AC
  Administered 2021-10-01: 13:00:00 250 mL via INTRAVENOUS

## 2021-10-01 MED ORDER — ACETAMINOPHEN 325 MG PO TABS
650.0000 mg | ORAL_TABLET | Freq: Once | ORAL | Status: AC
  Administered 2021-10-01: 13:00:00 650 mg via ORAL
  Filled 2021-10-01: qty 2

## 2021-10-01 MED ORDER — DIPHENHYDRAMINE HCL 25 MG PO CAPS: 25.0000 mg | ORAL_CAPSULE | Freq: Once | ORAL | Status: DC

## 2021-10-01 MED ORDER — IOHEXOL 300 MG/ML  SOLN
100.0000 mL | Freq: Once | INTRAMUSCULAR | Status: AC | PRN
  Administered 2021-10-01: 11:00:00 75 mL via INTRAVENOUS

## 2021-10-01 MED ORDER — SODIUM CHLORIDE (PF) 0.9 % IJ SOLN
INTRAMUSCULAR | Status: AC
  Filled 2021-10-01: qty 50

## 2021-10-01 NOTE — Progress Notes (Signed)
Pt refused Benadryl PO 25 mg as a premedication today prior to blood transfusion.  Pt stated that he has received blood in the past without premedications.  RN made [place], [place] aware. OK to proceed w/out benadryl.

## 2021-10-01 NOTE — Patient Instructions (Signed)
Blood Transfusion, Adult, Care After This sheet gives you information about how to care for yourself after your procedure. Your doctor may also give you more specific instructions. If you have problems or questions, contact your doctor. What can I expect after the procedure? After the procedure, it is common to have: Bruising and soreness at the IV site. A headache. Follow these instructions at home: Insertion site care   Follow instructions from your doctor about how to take care of your insertion site. This is where an IV tube was put into your vein. Make sure you: Wash your hands with soap and water before and after you change your bandage (dressing). If you cannot use soap and water, use hand sanitizer. Change your bandage as told by your doctor. Check your insertion site every day for signs of infection. Check for: Redness, swelling, or pain. Bleeding from the site. Warmth. Pus or a bad smell. General instructions Take over-the-counter and prescription medicines only as told by your doctor. Rest as told by your doctor. Go back to your normal activities as told by your doctor. Keep all follow-up visits as told by your doctor. This is important. Contact a doctor if: You have itching or red, swollen areas of skin (hives). You feel worried or nervous (anxious). You feel weak after doing your normal activities. You have redness, swelling, warmth, or pain around the insertion site. You have blood coming from the insertion site, and the blood does not stop with pressure. You have pus or a bad smell coming from the insertion site. Get help right away if: You have signs of a serious reaction. This may be coming from an allergy or the body's defense system (immune system). Signs include: Trouble breathing or shortness of breath. Swelling of the face or feeling warm (flushed). Fever or chills. Head, chest, or back pain. Dark pee (urine) or blood in the pee. Widespread rash. Fast  heartbeat. Feeling dizzy or light-headed. You may receive your blood transfusion in an outpatient setting. If so, you will be told whom to contact to report any reactions. These symptoms may be an emergency. Do not wait to see if the symptoms will go away. Get medical help right away. Call your local emergency services (911 in the [place].S.). Do not drive yourself to the hospital. Summary Bruising and soreness at the IV site are common. Check your insertion site every day for signs of infection. Rest as told by your doctor. Go back to your normal activities as told by your doctor. Get help right away if you have signs of a serious reaction. This information is not intended to replace advice given to you by your health care provider. Make sure you discuss any questions you have with your health care provider. Document Revised: 12/19/2020 Document Reviewed: 02/16/2019 Elsevier Patient Education  2022 Elsevier Inc.  

## 2021-10-01 NOTE — Telephone Encounter (Signed)
Scheduled per 1/24 los, message has been left with pt °

## 2021-10-01 NOTE — Telephone Encounter (Signed)
I spoke to [name] today as he presented to the infusion center to receive 1 unit of PRBC for progressive anemia with Hgb of 7.5. We have arranged for direct admission to [facility] hospital so that [facility] team can complete EGD/colonoscopy while he is inpatient. We are awaiting patient assistance for IV monoferric.   I reviewed the CT scan of the chest that was done earlier today due to bilateral chest pain that has been present for 4-6 weeks and having a fever yesterday. Findings revealed no acute process in the chest including infection. There is a moderate size hiatal hernia and bilateral old subacute rib fractures. There is a 2.2 x 2.1 cm density in the left kidney which could be a cyst. We will plan to obtain a renal US to further evaluate. He is afebrile today and vitals are stable.   Patient will follow up in clinic after he  undergoes endoscopic evaluation to repeat labs.

## 2021-10-02 LAB — TYPE AND SCREEN
ABO/RH(D): A POS
Antibody Screen: POSITIVE
DAT, IgG: POSITIVE
Donor AG Type: NEGATIVE
PT AG Type: NEGATIVE
Unit division: 0

## 2021-10-02 LAB — BPAM RBC
Blood Product Expiration Date: 202302072359
ISSUE DATE / TIME: 202301251312
Unit Type and Rh: 6200

## 2021-10-03 ENCOUNTER — Telehealth: Payer: Self-pay | Admitting: *Deleted

## 2021-10-03 ENCOUNTER — Telehealth: Payer: Self-pay

## 2021-10-03 ENCOUNTER — Encounter: Payer: Self-pay | Admitting: Physician Assistant

## 2021-10-03 NOTE — Telephone Encounter (Signed)
L/M for [name] to go to the ED to be re evaluated and for a possible direct admit.

## 2021-10-06 ENCOUNTER — Inpatient Hospital Stay: Payer: Self-pay

## 2021-10-06 ENCOUNTER — Other Ambulatory Visit: Payer: Self-pay | Admitting: Physician Assistant

## 2021-10-06 ENCOUNTER — Other Ambulatory Visit: Payer: Self-pay

## 2021-10-06 DIAGNOSIS — D500 Iron deficiency anemia secondary to blood loss (chronic): Secondary | ICD-10-CM

## 2021-10-06 LAB — CBC WITH DIFFERENTIAL (CANCER CENTER ONLY)
Abs Immature Granulocytes: 0.08 10*3/uL — ABNORMAL HIGH (ref 0.00–0.07)
Basophils Absolute: 0.1 10*3/uL (ref 0.0–0.1)
Basophils Relative: 1 %
Eosinophils Absolute: 0.7 10*3/uL — ABNORMAL HIGH (ref 0.0–0.5)
Eosinophils Relative: 7 %
HCT: 27.4 % — ABNORMAL LOW (ref 39.0–52.0)
Hemoglobin: 8.2 g/dL — ABNORMAL LOW (ref 13.0–17.0)
Immature Granulocytes: 1 %
Lymphocytes Relative: 10 %
Lymphs Abs: 1.1 10*3/uL (ref 0.7–4.0)
MCH: 22.6 pg — ABNORMAL LOW (ref 26.0–34.0)
MCHC: 29.9 g/dL — ABNORMAL LOW (ref 30.0–36.0)
MCV: 75.5 fL — ABNORMAL LOW (ref 80.0–100.0)
Monocytes Absolute: 0.8 10*3/uL (ref 0.1–1.0)
Monocytes Relative: 8 %
Neutro Abs: 7.6 10*3/uL (ref 1.7–7.7)
Neutrophils Relative %: 73 %
Platelet Count: 470 10*3/uL — ABNORMAL HIGH (ref 150–400)
RBC: 3.63 MIL/uL — ABNORMAL LOW (ref 4.22–5.81)
RDW: 19 % — ABNORMAL HIGH (ref 11.5–15.5)
WBC Count: 10.4 10*3/uL (ref 4.0–10.5)
nRBC: 0.2 % (ref 0.0–0.2)

## 2021-10-07 ENCOUNTER — Telehealth: Payer: Self-pay

## 2021-10-07 ENCOUNTER — Telehealth: Payer: Self-pay | Admitting: Hematology and Oncology

## 2021-10-07 ENCOUNTER — Other Ambulatory Visit: Payer: Self-pay | Admitting: Physician Assistant

## 2021-10-07 NOTE — Progress Notes (Signed)
..  Patient is receiving Assistance Medication - Supplied Externally. Medication: Monoferric Manufacture: Monof[employer] Approval Dates: Approved from 10/07/2021 until 10/07/2022. ID: [id number] Reason: Self Pay First DOS: 10/13/2021.  Marland KitchenDarlyne Russian, CPhT IV Drug Replacement Specialist [facility] Phone: [phone]

## 2021-10-07 NOTE — Telephone Encounter (Signed)
Scheduled per 1/31 secure chat, message was left with pt

## 2021-10-07 NOTE — Telephone Encounter (Signed)
Left message for patient to please call back. Trying to reschedule his EGD/Colonoscopy in th [facility]

## 2021-10-07 NOTE — Telephone Encounter (Signed)
-----   Message from Imogene Burn, MD sent at 10/07/2021 12:22 PM EST ----- Okay, we can try again for scopes, but I have a feeling he will just not show up again.Marland Kitchen Perhaps he should be told that if he does not show up for his next procedures, he will no longer get funding from the cancer center? I'm not sure if he has secondary gain from making sure that he stays anemic. The tricky part is that if he ends up having esophagitis or PUD as a source of his bleeding, he will need to be compliant with taking medications an outpatient, which I am not sure he will be able to do.   Ammie, let's go ahead and schedule him for EGD/colon again at [facility]. ----- Message ----- From: Raymondo Band Sent: 10/07/2021   8:49 AM EST To: Imogene Burn, MD  Patient does not want to go to the ER for direct admission. Hgb has improved with 1 unit of PRBC. We are awaiting authorization for IV iron. Thoughts on next steps?

## 2021-10-10 ENCOUNTER — Other Ambulatory Visit: Payer: Self-pay | Admitting: *Deleted

## 2021-10-10 DIAGNOSIS — D500 Iron deficiency anemia secondary to blood loss (chronic): Secondary | ICD-10-CM

## 2021-10-13 ENCOUNTER — Inpatient Hospital Stay: Payer: Self-pay | Attending: Hematology and Oncology

## 2021-10-13 ENCOUNTER — Other Ambulatory Visit: Payer: Self-pay

## 2021-10-13 ENCOUNTER — Telehealth: Payer: Self-pay | Admitting: Physician Assistant

## 2021-10-13 ENCOUNTER — Telehealth: Payer: Self-pay

## 2021-10-13 ENCOUNTER — Inpatient Hospital Stay: Payer: Self-pay

## 2021-10-13 VITALS — BP 138/72 | HR 94 | Temp 98.7°F | Resp 17

## 2021-10-13 DIAGNOSIS — K2970 Gastritis, unspecified, without bleeding: Secondary | ICD-10-CM | POA: Insufficient documentation

## 2021-10-13 DIAGNOSIS — D509 Iron deficiency anemia, unspecified: Secondary | ICD-10-CM | POA: Insufficient documentation

## 2021-10-13 DIAGNOSIS — D508 Other iron deficiency anemias: Secondary | ICD-10-CM

## 2021-10-13 DIAGNOSIS — K209 Esophagitis, unspecified without bleeding: Secondary | ICD-10-CM | POA: Insufficient documentation

## 2021-10-13 DIAGNOSIS — D500 Iron deficiency anemia secondary to blood loss (chronic): Secondary | ICD-10-CM

## 2021-10-13 LAB — SAMPLE TO BLOOD BANK

## 2021-10-13 LAB — CBC WITH DIFFERENTIAL (CANCER CENTER ONLY)
Abs Immature Granulocytes: 0.03 10*3/uL (ref 0.00–0.07)
Basophils Absolute: 0.1 10*3/uL (ref 0.0–0.1)
Basophils Relative: 1 %
Eosinophils Absolute: 0.7 10*3/uL — ABNORMAL HIGH (ref 0.0–0.5)
Eosinophils Relative: 9 %
HCT: 24 % — ABNORMAL LOW (ref 39.0–52.0)
Hemoglobin: 7 g/dL — ABNORMAL LOW (ref 13.0–17.0)
Immature Granulocytes: 0 %
Lymphocytes Relative: 9 %
Lymphs Abs: 0.7 10*3/uL (ref 0.7–4.0)
MCH: 21.6 pg — ABNORMAL LOW (ref 26.0–34.0)
MCHC: 29.2 g/dL — ABNORMAL LOW (ref 30.0–36.0)
MCV: 74.1 fL — ABNORMAL LOW (ref 80.0–100.0)
Monocytes Absolute: 0.6 10*3/uL (ref 0.1–1.0)
Monocytes Relative: 8 %
Neutro Abs: 5.6 10*3/uL (ref 1.7–7.7)
Neutrophils Relative %: 73 %
Platelet Count: 472 10*3/uL — ABNORMAL HIGH (ref 150–400)
RBC: 3.24 MIL/uL — ABNORMAL LOW (ref 4.22–5.81)
RDW: 18.9 % — ABNORMAL HIGH (ref 11.5–15.5)
WBC Count: 7.7 10*3/uL (ref 4.0–10.5)
nRBC: 0 % (ref 0.0–0.2)

## 2021-10-13 LAB — IRON AND IRON BINDING CAPACITY (CC-WL,HP ONLY)
Iron: 15 ug/dL — ABNORMAL LOW (ref 45–182)
Saturation Ratios: 3 % — ABNORMAL LOW (ref 17.9–39.5)
TIBC: 451 ug/dL — ABNORMAL HIGH (ref 250–450)
UIBC: 436 ug/dL — ABNORMAL HIGH (ref 117–376)

## 2021-10-13 LAB — FERRITIN: Ferritin: <4 ng/mL — ABNORMAL LOW (ref 24–336)

## 2021-10-13 MED ORDER — SODIUM CHLORIDE 0.9 % IV SOLN
1000.0000 mg | Freq: Once | INTRAVENOUS | Status: AC
  Administered 2021-10-13: 1000 mg via INTRAVENOUS
  Filled 2021-10-13: qty 10

## 2021-10-13 MED ORDER — LORATADINE 10 MG PO TABS
10.0000 mg | ORAL_TABLET | Freq: Every day | ORAL | Status: DC
  Administered 2021-10-13: 10 mg via ORAL
  Filled 2021-10-13: qty 1

## 2021-10-13 MED ORDER — SODIUM CHLORIDE 0.9 % IV SOLN: Freq: Once | INTRAVENOUS | Status: AC

## 2021-10-13 NOTE — Telephone Encounter (Signed)
Follow up call to pt to see if he went to the ED per Elouise Munroe recommendations.  LM for him to call the office.

## 2021-10-13 NOTE — Patient Instructions (Signed)

## 2021-10-13 NOTE — Telephone Encounter (Signed)
Dr. Leonides Schanz out of the office today (10/13/21) and will return 10/14/21. Routing this message to her for her review upon her return.  Following message received from Jodie Echevaria, RN:  Elvina Sidle there, pt here for iron today. hgb 7.0 -- he is complaining of SOB but states it doesn't feel like it normally does when his hgb is low, says he feels like he needs an inhaler. Please let me know how you would like to proceed. Thank you!  Following response received from Georga Kaufmann, PA-C:  - FYI, patient declined blood transfusion, ED and direct admission - Hey Lyndsy Gilberto. Patient said he would like to arrange outpatient EGD/Colonoscopy as Dr. Leonides Schanz has discussed. I advised him to keep his phone near by so we can get this scheduled - I have explained to him that your team has tried several times to reach out to him - Let us know if you reach him to get the scopes scheduled.   Following response provided by Deon Pilling, LPN:  - Dr. Leonides Schanz is out of the office today. Unfortunately this is not an acceptable Hgb to safely proceed with scheduling an EGD and colonoscopy in OP setting. I have discussed this concern with our Practice Administrator and Anesthesia. All have agreed that he will need to proceed to the ED for further work up. EGD/colon can be arranged in a hospital setting where they are most equipped to handle any medical issues that may arise.  Following response received from Georga Kaufmann, [place], C:  - Understood - Myriam Jacobson: Can you reach out to the patient and explain this. The recommendation is ED - Lanora Manis: Is he still here?   Following response received from Jodie Echevaria, RN:  - He is not still here, he declined to stay for his 30 min post obs  Following response provided by Georga Kaufmann, PA-C:  - okay I will try to reach him by phone  Following response sent to all Team members by Deon Pilling, LPN:  - Given his complexity of care, social barriers and his decisions, I respectfully  request that care coordination efforts be documented in a patient encounter and not in a secure chat.  Following response received from Georga Kaufmann, PA-C:  - will do

## 2021-10-13 NOTE — Progress Notes (Signed)
Pt declined to stay for 30 min post obs, discharged with VSS.

## 2021-10-13 NOTE — Telephone Encounter (Signed)
Patient's hemoglobin is 7.0 today as he presented to for his IV iron infusion. I spoke to the patient in the infusion center. I discussed the acute decline in hemoglobin level from 8.2 on 10/06/2021 to 7.0 today. I recommended 1 unit of PRBC with his IV iron infusion which he declined. He only wants his IV iron today. Additionally, I discussed the recommendation to directly admit the patient to further evaluate underlying cause of progressive anemia.  [name] declined direct admission or going to the emergency room. He said that "he's just exhausted and doesn't want to do anything". I explained that his ongoing symptoms are likely secondary to anemia and endoscopic evaluation is crucial to determine underlying cause.   I reached out to [name] to see if they can arrange outpatient EGD/Colonoscopy. Due to hemoglobin not in an acceptable range, they cannot arrange for EGD/colonoscopy in the outpatient setting.   I tried to call [name] to review our recommendations and to go to the emergency room for further workup. I was unable to reach him and left a voicemail. I left the cancer center number [phone]) for him to call back if he has any questions/concerns.

## 2021-10-13 NOTE — Progress Notes (Signed)
Discussed Hgb and SOB with Dede Query, PA. She came and spoke with pt, pt declined blood transfusion or ED at this time.

## 2021-10-14 NOTE — Telephone Encounter (Signed)
Used 2 Breo inhalers as samples. Pt never picked up or returned calls.

## 2021-10-17 ENCOUNTER — Other Ambulatory Visit: Payer: Self-pay

## 2021-10-17 ENCOUNTER — Inpatient Hospital Stay: Payer: Self-pay

## 2021-10-17 ENCOUNTER — Telehealth: Payer: Self-pay

## 2021-10-17 ENCOUNTER — Other Ambulatory Visit: Payer: Self-pay | Admitting: Physician Assistant

## 2021-10-17 DIAGNOSIS — D508 Other iron deficiency anemias: Secondary | ICD-10-CM

## 2021-10-17 LAB — CBC WITH DIFFERENTIAL (CANCER CENTER ONLY)
Abs Immature Granulocytes: 1 10*3/uL — ABNORMAL HIGH (ref 0.00–0.07)
Basophils Absolute: 0.1 10*3/uL (ref 0.0–0.1)
Basophils Relative: 1 %
Eosinophils Absolute: 0.8 10*3/uL — ABNORMAL HIGH (ref 0.0–0.5)
Eosinophils Relative: 6 %
HCT: 28.5 % — ABNORMAL LOW (ref 39.0–52.0)
Hemoglobin: 8.4 g/dL — ABNORMAL LOW (ref 13.0–17.0)
Immature Granulocytes: 7 %
Lymphocytes Relative: 6 %
Lymphs Abs: 0.8 10*3/uL (ref 0.7–4.0)
MCH: 23 pg — ABNORMAL LOW (ref 26.0–34.0)
MCHC: 29.5 g/dL — ABNORMAL LOW (ref 30.0–36.0)
MCV: 77.9 fL — ABNORMAL LOW (ref 80.0–100.0)
Monocytes Absolute: 0.9 10*3/uL (ref 0.1–1.0)
Monocytes Relative: 6 %
Neutro Abs: 9.9 10*3/uL — ABNORMAL HIGH (ref 1.7–7.7)
Neutrophils Relative %: 74 %
Platelet Count: 452 10*3/uL — ABNORMAL HIGH (ref 150–400)
RBC: 3.66 MIL/uL — ABNORMAL LOW (ref 4.22–5.81)
RDW: 24.8 % — ABNORMAL HIGH (ref 11.5–15.5)
Smear Review: NORMAL
WBC Count: 13.5 10*3/uL — ABNORMAL HIGH (ref 4.0–10.5)
nRBC: 1.9 % — ABNORMAL HIGH (ref 0.0–0.2)

## 2021-10-17 LAB — SAMPLE TO BLOOD BANK

## 2021-10-17 NOTE — Telephone Encounter (Signed)
Pt came by the office today stating he had been to GI office and said he "could not get anywhere with them" and wanted to know if there was anything we could do.  I spoke with Georga Kaufmann, PA-C and she advised to get a CBC first to see where he is on his iron levels and then go from there.  Pt agreed to lab work but stated he was not going to the ED for any reason.  Pt was advised of 8.4 hgb and confirmed his 10/27/21 appt with Dr Irene Limbo. He said he would continue to try to contact GI

## 2021-10-21 NOTE — Telephone Encounter (Signed)
Pt responded to My Chart message containing prep instructions with "Thank you"

## 2021-10-22 ENCOUNTER — Encounter: Payer: Self-pay | Admitting: *Deleted

## 2021-10-22 ENCOUNTER — Encounter: Payer: Self-pay | Admitting: Physician Assistant

## 2021-10-22 NOTE — Progress Notes (Signed)
[name] Work  Clinical Social Work contacted patient by phone to discuss hotel arrangements for bowel-prep for endoscopy 2/21.  CSW explained to patient-hotel is limited to one-night stay. Engineer, building services made approval for this expense- patient is enrolled in [employer].    Patient verbalized understanding to meet with CSW on 2/20 after medical oncology visit.    Gwinda Maine, LCSW  Clinical Social Worker [facility]

## 2021-10-27 ENCOUNTER — Inpatient Hospital Stay (HOSPITAL_BASED_OUTPATIENT_CLINIC_OR_DEPARTMENT_OTHER): Payer: Self-pay | Admitting: Hematology and Oncology

## 2021-10-27 ENCOUNTER — Other Ambulatory Visit: Payer: Self-pay

## 2021-10-27 ENCOUNTER — Other Ambulatory Visit: Payer: Self-pay | Admitting: Hematology and Oncology

## 2021-10-27 ENCOUNTER — Inpatient Hospital Stay: Payer: Self-pay

## 2021-10-27 ENCOUNTER — Inpatient Hospital Stay: Payer: Self-pay | Admitting: *Deleted

## 2021-10-27 VITALS — BP 126/74 | HR 103 | Temp 98.3°F | Resp 17 | Wt 300.6 lb

## 2021-10-27 DIAGNOSIS — R5382 Chronic fatigue, unspecified: Secondary | ICD-10-CM

## 2021-10-27 DIAGNOSIS — D500 Iron deficiency anemia secondary to blood loss (chronic): Secondary | ICD-10-CM

## 2021-10-27 LAB — CMP (CANCER CENTER ONLY)
ALT: 14 U/L (ref 0–44)
AST: 17 U/L (ref 15–41)
Albumin: 4 g/dL (ref 3.5–5.0)
Alkaline Phosphatase: 102 U/L (ref 38–126)
Anion gap: 7 (ref 5–15)
BUN: 19 mg/dL (ref 6–20)
CO2: 26 mmol/L (ref 22–32)
Calcium: 8.8 mg/dL — ABNORMAL LOW (ref 8.9–10.3)
Chloride: 106 mmol/L (ref 98–111)
Creatinine: 1.06 mg/dL (ref 0.61–1.24)
GFR, Estimated: >60 mL/min (ref >60)
Glucose, Bld: 122 mg/dL — ABNORMAL HIGH (ref 70–99)
Potassium: 4.2 mmol/L (ref 3.5–5.1)
Sodium: 139 mmol/L (ref 135–145)
Total Bilirubin: 0.3 mg/dL (ref 0.3–1.2)
Total Protein: 6.9 g/dL (ref 6.5–8.1)

## 2021-10-27 LAB — CBC WITH DIFFERENTIAL (CANCER CENTER ONLY)
Abs Immature Granulocytes: 0.03 10*3/uL (ref 0.00–0.07)
Basophils Absolute: 0.1 10*3/uL (ref 0.0–0.1)
Basophils Relative: 1 %
Eosinophils Absolute: 0.5 10*3/uL (ref 0.0–0.5)
Eosinophils Relative: 7 %
HCT: 31 % — ABNORMAL LOW (ref 39.0–52.0)
Hemoglobin: 9.2 g/dL — ABNORMAL LOW (ref 13.0–17.0)
Immature Granulocytes: 0 %
Lymphocytes Relative: 9 %
Lymphs Abs: 0.6 10*3/uL — ABNORMAL LOW (ref 0.7–4.0)
MCH: 23.5 pg — ABNORMAL LOW (ref 26.0–34.0)
MCHC: 29.7 g/dL — ABNORMAL LOW (ref 30.0–36.0)
MCV: 79.1 fL — ABNORMAL LOW (ref 80.0–100.0)
Monocytes Absolute: 0.7 10*3/uL (ref 0.1–1.0)
Monocytes Relative: 9 %
Neutro Abs: 5.3 10*3/uL (ref 1.7–7.7)
Neutrophils Relative %: 74 %
Platelet Count: 420 10*3/uL — ABNORMAL HIGH (ref 150–400)
RBC: 3.92 MIL/uL — ABNORMAL LOW (ref 4.22–5.81)
RDW: 24.5 % — ABNORMAL HIGH (ref 11.5–15.5)
WBC Count: 7.2 10*3/uL (ref 4.0–10.5)
nRBC: 0 % (ref 0.0–0.2)

## 2021-10-27 LAB — RETIC PANEL
Immature Retic Fract: 33.6 % — ABNORMAL HIGH (ref 2.3–15.9)
RBC.: 3.98 MIL/uL — ABNORMAL LOW (ref 4.22–5.81)
Retic Count, Absolute: 146.1 10*3/uL (ref 19.0–186.0)
Retic Ct Pct: 3.7 % — ABNORMAL HIGH (ref 0.4–3.1)
Reticulocyte Hemoglobin: 22.1 pg — ABNORMAL LOW (ref >27.9)

## 2021-10-27 LAB — IRON AND IRON BINDING CAPACITY (CC-WL,HP ONLY)
Iron: 32 ug/dL — ABNORMAL LOW (ref 45–182)
Saturation Ratios: 7 % — ABNORMAL LOW (ref 17.9–39.5)
TIBC: 462 ug/dL — ABNORMAL HIGH (ref 250–450)
UIBC: 430 ug/dL — ABNORMAL HIGH (ref 117–376)

## 2021-10-27 LAB — FERRITIN: Ferritin: 79 ng/mL (ref 24–336)

## 2021-10-27 NOTE — Progress Notes (Signed)
[facility] [phone]   [phone]  PROGRESS NOTE  Patient Care Team: Kinnie Feil, MD as PCP - General (Family Medicine)  Hematological/Oncological History  #Iron deficiency anemia 2/2 to Presume GI Bleeding 1) 03/14/2021: TIBC 349, Iron 30 (L), Iron saturation 9% (L), Ferritin 15 (L), Vitamin B12 537, Folate 7.4, WBC 8.6, Hgb 10.7 (L), MCV 74 (L), Plt 450.   2) 05/07/2021: Establish care with Dede Query PA-C  3) 05/26/2021-06/20/2021: Received IV venofer 200 mg once a week x 5 doses  4) 07/29/2021-08/26/2021: Received IV venofer 200 mg once a week x 5 doses   HISTORY OF PRESENTING ILLNESS:  [name] 56 y.o. male returns for follow-up for iron deficiency anemia.  His last visit was on 09/30/2021 with Dede Query.  In the interim since his last visit he has had no major changes in his health.  On exam today, Mr. Remedios reports he continues to see no overt signs of bleeding, bruising, or dark stools.  He reports that the only explanation he can have for his loss of blood are "vampires".  He notes that he continues to have marked fatigue and all he wants to do his "sleep".  He notes that he does have shortness of breath as well as rapid heart rate on occasion.  He has had episodes where he is in such a deep sleep that he is not able to wake up though he does have obstructive sleep apnea.  This is likely due to the fact that he is unable to use a CPAP machine in his car.  He continues to eat a poor diet and his weight is increasing much to his dismay.  He denies any fevers, chills, night sweats, dizziness, syncopal episodes, rash or other skin changes. He has no other complaints. Rest of the 10 point ROS is below.   We had the patient meet with the social worker today to confirm everything is in order with his hotel room for the night so that he can get to his endoscopy having gone through the prep kit.  He voices understanding of all the steps  required to have a colonoscopy performed and was informed to call us with any questions or concerns regarding his care.  MEDICAL HISTORY:  Past Medical History:  Diagnosis Date   ADD (attention deficit disorder)    ADD (attention deficit disorder)    Anemia, iron deficiency    Asthma    Blood transfusion    Constipation    Depression    Depression    Exertional dyspnea    secondary to anemia   Hiatal hernia    Sleep apnea 01/2018    SURGICAL HISTORY: Past Surgical History:  Procedure Laterality Date   COLONOSCOPY      SOCIAL HISTORY: Social History   Socioeconomic History   Marital status: Single    Spouse name: Not on file   Number of children: 0   Years of education: Not on file   Highest education level: Not on file  Occupational History   Occupation: student-Business  Tobacco Use   Smoking status: Never   Smokeless tobacco: Never  Vaping Use   Vaping Use: Never used  Substance and Sexual Activity   Alcohol use: No   Drug use: No   Sexual activity: Not Currently  Other Topics Concern   Not on file  Social History Narrative   Lives alone. Never married, no children.  Has two sister who live in  [place].  Student at work study.  [employer].  Working toward Engineer, technical sales.  Previously worked in Engineer, manufacturing systems.  Was also previously homeless due to job loss and psychiatric exacerbation.   Social Determinants of Health   Financial Resource Strain: High Risk   Difficulty of Paying Living Expenses: Hard  Food Insecurity: Food Insecurity Present   Worried About Charity fundraiser in the Last Year: Sometimes true   Arboriculturist in the Last Year: Sometimes true  Transportation Needs: Public librarian (Medical): Yes   Lack of Transportation (Non-Medical): Yes  Physical Activity: Not on file  Stress: Not on file  Social Connections: Not on file  Intimate Partner Violence: Not on file    FAMILY  HISTORY: Family History  Problem Relation Age of Onset   Diabetes Paternal Grandfather    Cancer Neg Hx    Stroke Neg Hx    Breast cancer Neg Hx    Celiac disease Neg Hx    Cirrhosis Neg Hx    Clotting disorder Neg Hx    Colitis Neg Hx    Colon cancer Neg Hx    Colon polyps Neg Hx    Crohn's disease Neg Hx    Cystic fibrosis Neg Hx    Esophageal cancer Neg Hx    Heart disease Neg Hx    Hemochromatosis Neg Hx    Inflammatory bowel disease Neg Hx    Irritable bowel syndrome Neg Hx    Kidney disease Neg Hx    Liver cancer Neg Hx    Liver disease Neg Hx    Ovarian cancer Neg Hx    Pancreatic cancer Neg Hx    Prostate cancer Neg Hx    Rectal cancer Neg Hx    Stomach cancer Neg Hx    Ulcerative colitis Neg Hx    Uterine cancer Neg Hx    Wilson's disease Neg Hx     ALLERGIES:  has No Known Allergies.  MEDICATIONS:  Current Outpatient Medications  Medication Sig Dispense Refill   albuterol (VENTOLIN HFA) 108 (90 Base) MCG/ACT inhaler Inhale 1-2 puffs into the lungs every 6 (six) hours as needed for wheezing or shortness of breath. 18 g 1   fluticasone furoate-vilanterol (BREO ELLIPTA) 100-25 MCG/INH AEPB Inhale 1 puff into the lungs daily. 1 each 0   No current facility-administered medications for this visit.    REVIEW OF SYSTEMS:   Constitutional: ( - ) fevers, ( - )  chills , ( - ) night sweats Eyes: ( - ) blurriness of vision, ( - ) double vision, ( - ) watery eyes Ears, nose, mouth, throat, and face: ( - ) mucositis, ( - ) sore throat Respiratory: ( + ) cough, ( + ) dyspnea, ( - ) wheezes Cardiovascular: ( - ) palpitation, ( + ) chest discomfort, ( - ) lower extremity swelling Gastrointestinal:  ( + ) nausea, ( - ) heartburn, ( - ) change in bowel habits Skin: ( - ) abnormal skin rashes Lymphatics: ( - ) new lymphadenopathy, ( - ) easy bruising Neurological: ( - ) numbness, ( - ) tingling, ( - ) new weaknesses Behavioral/Psych: ( - ) mood change, ( - ) new changes   All other systems were reviewed with the patient and are negative.  PHYSICAL EXAMINATION: ECOG PERFORMANCE STATUS: 2 - Symptomatic, <50% confined to bed  Vitals:   10/27/21 1026  BP: 126/74  Pulse: (!) 103  Resp: 17  Temp: 98.3 F (36.8 C)  SpO2: 98%   Filed Weights   10/27/21 1026  Weight: (!) 300 lb 9.6 oz (136.4 kg)    GENERAL: well appearing male in NAD, obese SKIN: skin color, texture, turgor are normal, no rashes or significant lesions EYES: conjunctiva are pink and non-injected, sclera clear LUNGS: clear to auscultation and percussion with normal breathing effort HEART:  Tachycardic.Regular rhythm and no murmurs and no lower extremity edema. Musculoskeletal: no cyanosis of digits and no clubbing  PSYCH: alert & oriented x 3, fluent speech NEURO: no focal motor/sensory deficits  LABORATORY DATA:  I have reviewed the data as listed CBC Latest Ref Rng & Units 10/27/2021 10/17/2021 10/13/2021  WBC 4.0 - 10.5 K/uL 7.2 13.5(H) 7.7  Hemoglobin 13.0 - 17.0 g/dL 9.2(L) 8.4(L) 7.0(L)  Hematocrit 39.0 - 52.0 % 31.0(L) 28.5(L) 24.0(L)  Platelets 150 - 400 K/uL 420(H) 452(H) 472(H)    CMP Latest Ref Rng & Units 10/27/2021 09/30/2021 05/07/2021  Glucose 70 - 99 mg/dL 122(H) 114(H) 98  BUN 6 - 20 mg/dL 19 24(H) 24(H)  Creatinine 0.61 - 1.24 mg/dL 1.06 1.27(H) 1.11  Sodium 135 - 145 mmol/L 139 139 140  Potassium 3.5 - 5.1 mmol/L 4.2 4.4 4.6  Chloride 98 - 111 mmol/L 106 110 106  CO2 22 - 32 mmol/L 26 23 25   Calcium 8.9 - 10.3 mg/dL 8.8(L) 8.6(L) 9.2  Total Protein 6.5 - 8.1 g/dL 6.9 6.7 7.1  Total Bilirubin 0.3 - 1.2 mg/dL 0.3 0.2(L) <0.2(L)  Alkaline Phos 38 - 126 U/L 102 93 81  AST 15 - 41 U/L 17 11(L) 33  ALT 0 - 44 U/L 14 9 60(H)    ASSESSMENT & PLAN MESHACH PERRY is a 56 y.o. male returns for follow-up for iron deficiency anemia.  #Iron deficiency anemia: --Etiology unknown, he denies easy bruising or signs of bleeding. Prior cause was secondary to significant  gastritis and esophagitis. --Established care with Dr. [name] at [facility]. He is scheduled for EGD/Colonoscopy tomorrow --Patient is unable to tolerate oral iron supplementation due to constipation.   --Patient received another round of IV Venofer x5 doses from 07/29/2021-08/26/2021 --Labs today persistent anemia Hgb 9.2, MCV 79.1. Iron panel pending.  --Colonoscopy scheduled for tomorrow with Dr. Dayna Barker --RTC in two weeks with labs and f/u visit in 4 weeks  No orders of the defined types were placed in this encounter.  All questions were answered. The patient knows to call the clinic with any problems, questions or concerns.  I have spent a total of 30 minutes minutes of face-to-face and non-face-to-face time, preparing to see the patient, performing a medically appropriate examination, counseling and educating the patient, ordering medications/tests, communicating with other health care professionals, documenting clinical information in the electronic health record, and care coordination.   Ledell Peoples, MD Department of Hematology/Oncology [place] at [facility] Phone: [phone] Pager: [phone] Email: [email or web address]

## 2021-10-28 ENCOUNTER — Telehealth: Payer: Self-pay | Admitting: *Deleted

## 2021-10-28 ENCOUNTER — Ambulatory Visit (AMBULATORY_SURGERY_CENTER): Payer: Self-pay | Admitting: Internal Medicine

## 2021-10-28 ENCOUNTER — Telehealth: Payer: Self-pay | Admitting: Hematology and Oncology

## 2021-10-28 ENCOUNTER — Encounter: Payer: Self-pay | Admitting: Internal Medicine

## 2021-10-28 ENCOUNTER — Other Ambulatory Visit (HOSPITAL_COMMUNITY): Payer: Self-pay

## 2021-10-28 ENCOUNTER — Encounter: Payer: Self-pay | Admitting: Physician Assistant

## 2021-10-28 VITALS — BP 128/89 | HR 89 | Temp 98.6°F | Resp 18 | Ht 71.0 in | Wt 287.0 lb

## 2021-10-28 DIAGNOSIS — D509 Iron deficiency anemia, unspecified: Secondary | ICD-10-CM

## 2021-10-28 DIAGNOSIS — Z860100 Personal history of colon polyps, unspecified: Secondary | ICD-10-CM

## 2021-10-28 DIAGNOSIS — K259 Gastric ulcer, unspecified as acute or chronic, without hemorrhage or perforation: Secondary | ICD-10-CM

## 2021-10-28 DIAGNOSIS — K2980 Duodenitis without bleeding: Secondary | ICD-10-CM

## 2021-10-28 DIAGNOSIS — K31A0 Gastric intestinal metaplasia, unspecified: Secondary | ICD-10-CM

## 2021-10-28 DIAGNOSIS — K3189 Other diseases of stomach and duodenum: Secondary | ICD-10-CM

## 2021-10-28 DIAGNOSIS — Z538 Procedure and treatment not carried out for other reasons: Secondary | ICD-10-CM

## 2021-10-28 DIAGNOSIS — K2970 Gastritis, unspecified, without bleeding: Secondary | ICD-10-CM

## 2021-10-28 DIAGNOSIS — K209 Esophagitis, unspecified without bleeding: Secondary | ICD-10-CM

## 2021-10-28 DIAGNOSIS — K449 Diaphragmatic hernia without obstruction or gangrene: Secondary | ICD-10-CM

## 2021-10-28 DIAGNOSIS — K219 Gastro-esophageal reflux disease without esophagitis: Secondary | ICD-10-CM

## 2021-10-28 DIAGNOSIS — K2100 Gastro-esophageal reflux disease with esophagitis, without bleeding: Secondary | ICD-10-CM

## 2021-10-28 DIAGNOSIS — Z1211 Encounter for screening for malignant neoplasm of colon: Secondary | ICD-10-CM

## 2021-10-28 MED ORDER — PANTOPRAZOLE SODIUM 40 MG PO TBEC
40.0000 mg | DELAYED_RELEASE_TABLET | Freq: Two times a day (BID) | ORAL | 3 refills | Status: DC
  Filled 2021-10-28: qty 90, 45d supply, fill #0
  Filled 2021-12-22: qty 90, 45d supply, fill #1
  Filled 2022-03-27: qty 90, 45d supply, fill #2

## 2021-10-28 MED ORDER — FLEET ENEMA 7-19 GM/118ML RE ENEM
1.0000 | ENEMA | Freq: Once | RECTAL | Status: AC
  Administered 2021-10-28: 1 via RECTAL

## 2021-10-28 MED ORDER — SODIUM CHLORIDE 0.9 % IV SOLN: 500.0000 mL | Freq: Once | INTRAVENOUS | Status: DC

## 2021-10-28 NOTE — Progress Notes (Signed)
Called to room to assist during endoscopic procedure.  Patient ID and intended procedure confirmed with present staff. Received instructions for my participation in the procedure from the performing physician.  

## 2021-10-28 NOTE — Telephone Encounter (Signed)
Patient scheduled for repeat EGD to check for healing and colonoscopy due to poor prep. Pre visit instructions reviewed with patient and patient verbalized understanding.

## 2021-10-28 NOTE — Progress Notes (Signed)
GASTROENTEROLOGY PROCEDURE H&P NOTE   Primary Care Physician: [name], MD    Reason for Procedure:   IDA, history of colon polyps  Plan:    EGD/colonoscopy  Patient is appropriate for endoscopic procedure(s) in the ambulatory ([facility]) setting.  The nature of the procedure, as well as the risks, benefits, and alternatives were carefully and thoroughly reviewed with the patient. Ample time for discussion and questions allowed. The patient understood, was satisfied, and agreed to proceed.     HPI: [name] is a 56 y.o. male who presents for EGD/colonoscopy for evaluation of IDA and history of colon polyps .  Patient was most recently seen in the Gastroenterology Clinic on 08/08/21.  No interval change in medical history since that appointment. Please refer to that note for full details regarding GI history and clinical presentation.   Past Medical History:  Diagnosis Date   ADD (attention deficit disorder)    ADD (attention deficit disorder)    Allergy    Anemia, iron deficiency    Anxiety    Asthma    Blood transfusion    Constipation    Depression    Depression    Exertional dyspnea    secondary to anemia   GERD (gastroesophageal reflux disease)    Hiatal hernia    Narcolepsy    pt also reports episodes of passing out that are unrelated to narcolepsy about a year ago   Sleep apnea 01/2018   has a CPAP, "unable to use it"    Past Surgical History:  Procedure Laterality Date   COLONOSCOPY     UPPER GASTROINTESTINAL ENDOSCOPY      Prior to Admission medications   Medication Sig Start Date End Date Taking? Authorizing Provider  fluticasone furoate-vilanterol (BREO ELLIPTA) 100-25 MCG/INH AEPB Inhale 1 puff into the lungs daily. 03/14/21  Yes [name], MD  albuterol (VENTOLIN HFA) 108 (90 Base) MCG/ACT inhaler Inhale 1-2 puffs into the lungs every 6 (six) hours as needed for wheezing or shortness of breath. 03/14/21   [name], MD     Current Outpatient Medications  Medication Sig Dispense Refill   fluticasone furoate-vilanterol (BREO ELLIPTA) 100-25 MCG/INH AEPB Inhale 1 puff into the lungs daily. 1 each 0   albuterol (VENTOLIN HFA) 108 (90 Base) MCG/ACT inhaler Inhale 1-2 puffs into the lungs every 6 (six) hours as needed for wheezing or shortness of breath. 18 g 1   Current Facility-Administered Medications  Medication Dose Route Frequency Provider Last Rate Last Admin   0.9 %  sodium chloride infusion  500 mL Intravenous Once Imogene Burn, MD        Allergies as of 10/28/2021   (No Known Allergies)    Family History  Problem Relation Age of Onset   Diabetes Paternal Grandfather    Cancer Neg Hx    Stroke Neg Hx    Breast cancer Neg Hx    Celiac disease Neg Hx    Cirrhosis Neg Hx    Clotting disorder Neg Hx    Colitis Neg Hx    Colon cancer Neg Hx    Colon polyps Neg Hx    Crohn's disease Neg Hx    Cystic fibrosis Neg Hx    Esophageal cancer Neg Hx    Heart disease Neg Hx    Hemochromatosis Neg Hx    Inflammatory bowel disease Neg Hx    Irritable bowel syndrome Neg Hx    Kidney disease Neg Hx  Liver cancer Neg Hx    Liver disease Neg Hx    Ovarian cancer Neg Hx    Pancreatic cancer Neg Hx    Prostate cancer Neg Hx    Rectal cancer Neg Hx    Stomach cancer Neg Hx    Ulcerative colitis Neg Hx    Uterine cancer Neg Hx    Wilson's disease Neg Hx     Social History   Socioeconomic History   Marital status: Single    Spouse name: Not on file   Number of children: 0   Years of education: Not on file   Highest education level: Not on file  Occupational History   Occupation: student-Business  Tobacco Use   Smoking status: Never   Smokeless tobacco: Never  Vaping Use   Vaping Use: Never used  Substance and Sexual Activity   Alcohol use: No   Drug use: No   Sexual activity: Not Currently  Other Topics Concern   Not on file  Social History Narrative   Lives alone. Never married,  no children.  Has two sister who live in [place].  Student at work study.  [employer].  Working toward Engineer, technical sales.  Previously worked in Engineer, manufacturing systems.  Was also previously homeless due to job loss and psychiatric exacerbation.   Social Determinants of Health   Financial Resource Strain: High Risk   Difficulty of Paying Living Expenses: Hard  Food Insecurity: Food Insecurity Present   Worried About Charity fundraiser in the Last Year: Sometimes true   Ran Out of Food in the Last Year: Sometimes true  Transportation Needs: Unmet Transportation Needs   Lack of Transportation (Medical): Yes   Lack of Transportation (Non-Medical): Yes  Physical Activity: Not on file  Stress: Not on file  Social Connections: Not on file  Intimate Partner Violence: Not on file    Physical Exam: Vital signs in last 24 hours: BP (!) 148/94    Pulse 96    Temp 98.6 F (37 C) (Temporal)    Ht 5\' 11"  (1.803 m)    Wt 287 lb (130.2 kg)    SpO2 97%    BMI 40.03 kg/m  GEN: NAD EYE: Sclerae anicteric ENT: MMM CV: Non-tachycardic Pulm: No increased WOB GI: Soft NEURO:  Alert & Oriented   [name], MD [facility]   10/28/2021 10:03 AM

## 2021-10-28 NOTE — Op Note (Addendum)
[facility] Patient Name: [name] Procedure Date: 10/28/2021 10:11 AM MRN: [id number] Endoscopist: Sonny Masters "[name] ,  Age: 56 Referring MD:  Date of Birth: 1966-04-25 Gender: Male Account #: [id number] Procedure:                Colonoscopy Indications:              Iron deficiency anemia Medicines:                Monitored Anesthesia Care Procedure:                Pre-Anesthesia Assessment:                           - Prior to the procedure, a History and Physical                            was performed, and patient medications and                            allergies were reviewed. The patient's tolerance of                            previous anesthesia was also reviewed. The risks                            and benefits of the procedure and the sedation                            options and risks were discussed with the patient.                            All questions were answered, and informed consent                            was obtained. Prior Anticoagulants: The patient has                            taken no previous anticoagulant or antiplatelet                            agents. ASA Grade Assessment: III - A patient with                            severe systemic disease. After reviewing the risks                            and benefits, the patient was deemed in                            satisfactory condition to undergo the procedure.                           After obtaining informed consent, the colonoscope  was passed under direct vision. Throughout the                            procedure, the patient's blood pressure, pulse, and                            oxygen saturations were monitored continuously. The                            Olympus CF-HQ190L [id number]) Colonoscope was                            introduced through the anus and advanced to the the                            descending colon. The colonoscopy  was performed                            without difficulty. The patient tolerated the                            procedure well. The quality of the bowel                            preparation was poor. The rectum was photographed. Scope In: 10:27:41 AM Scope Out: 10:30:48 AM Total Procedure Duration: 0 hours 3 minutes 7 seconds  Findings:                 A large amount of solid stool was found in the                            entire colon, interfering with visualization.                           Non-bleeding internal hemorrhoids were found during                            retroflexion. Complications:            No immediate complications. Estimated Blood Loss:     Estimated blood loss: none. Impression:               - Preparation of the colon was poor.                           - Stool in the entire examined colon.                           - Non-bleeding internal hemorrhoids.                           - No specimens collected. Recommendation:           - Discharge patient to home (with escort).                           -  It is suspected that your iron deficiency anemia                            is due to esophagitis, Cameron's lesions, and                            gastric ulcers seen on the upper endoscopy                            procedure.                           - Repeat colonoscopy at the next available                            appointment (can be scheduled with the patient's                            next EGD) with a 2 day bowel preparation because                            the bowel preparation was poor.                           - The findings and recommendations were discussed                            with the patient. Sonny Masters "[name],  10/28/2021 10:43:36 AM

## 2021-10-28 NOTE — Patient Instructions (Signed)
Reschedule Colonoscopy & Endoscopy for 2- 3 months   Handouts on Gastritis & hiatal hernia   PANTOPRAZOLE 40 ME TWICE A DAY -SENT TO YOU PHARMACY FOR YOU TO PICK UP - THIS WILL TAKE CARE OF REFLUX AND HEAL ULCERS   HANDOUT ON HEMORRHOIDS GIVEN TO YOU TODAY    YOU HAD AN ENDOSCOPIC PROCEDURE TODAY AT THE [facility]:   Refer to the procedure report that was given to you for any specific questions about what was found during the examination.  If the procedure report does not answer your questions, please call your gastroenterologist to clarify.  If you requested that your care partner not be given the details of your procedure findings, then the procedure report has been included in a sealed envelope for you to review at your convenience later.  YOU SHOULD EXPECT: Some feelings of bloating in the abdomen. Passage of more gas than usual.  Walking can help get rid of the air that was put into your GI tract during the procedure and reduce the bloating. If you had a lower endoscopy (such as a colonoscopy or flexible sigmoidoscopy) you may notice spotting of blood in your stool or on the toilet paper. If you underwent a bowel prep for your procedure, you may not have a normal bowel movement for a few days.  Please Note:  You might notice some irritation and congestion in your nose or some drainage.  This is from the oxygen used during your procedure.  There is no need for concern and it should clear up in a day or so.  SYMPTOMS TO REPORT IMMEDIATELY:  Following lower endoscopy (colonoscopy or flexible sigmoidoscopy):  Excessive amounts of blood in the stool  Significant tenderness or worsening of abdominal pains  Swelling of the abdomen that is new, acute  Fever of 100F or higher  Following upper endoscopy (EGD)  Vomiting of blood or coffee ground material  New chest pain or pain under the shoulder blades  Painful or persistently difficult swallowing  New shortness of  breath  Fever of 100F or higher  Black, tarry-looking stools  For urgent or emergent issues, a gastroenterologist can be reached at any hour by calling [phone]. Do not use MyChart messaging for urgent concerns.    DIET:  We do recommend a small meal at first, but then you may proceed to your regular diet.  Drink plenty of fluids but you should avoid alcoholic beverages for 24 hours.  ACTIVITY:  You should plan to take it easy for the rest of today and you should NOT DRIVE or use heavy machinery until tomorrow (because of the sedation medicines used during the test).    FOLLOW UP: Our staff will call the number listed on your records 48-72 hours following your procedure to check on you and address any questions or concerns that you may have regarding the information given to you following your procedure. If we do not reach you, we will leave a message.  We will attempt to reach you two times.  During this call, we will ask if you have developed any symptoms of COVID 19. If you develop any symptoms (ie: fever, flu-like symptoms, shortness of breath, cough etc.) before then, please call [phone].  If you test positive for Covid 19 in the 2 weeks post procedure, please call and report this information to [place].    If any biopsies were taken you will be contacted by phone or by letter within the next  1-3 weeks.  Please call us at [phone] if you have not heard about the biopsies in 3 weeks.    SIGNATURES/CONFIDENTIALITY: You and/or your care partner have signed paperwork which will be entered into your electronic medical record.  These signatures attest to the fact that that the information above on your After Visit Summary has been reviewed and is understood.  Full responsibility of the confidentiality of this discharge information lies with you and/or your care-partner.

## 2021-10-28 NOTE — Progress Notes (Signed)
Pt given  pre-visit instructions by Crosby Oyster for repeat Colonoscopy / Endoscopy for 12/26/21 - procedures scheduled by [facility]

## 2021-10-28 NOTE — Op Note (Addendum)
[place] Patient Name: [name] Procedure Date: 10/28/2021 10:12 AM MRN: [id number] Endoscopist: Sonny Masters "[name] ,  Age: 56 Referring MD:  Date of Birth: 29-Dec-1965 Gender: Male Account #: [id number] Procedure:                Upper GI endoscopy Indications:              Iron deficiency anemia Medicines:                Monitored Anesthesia Care Procedure:                After obtaining informed consent, the endoscope was                            passed under direct vision. Throughout the                            procedure, the patient's blood pressure, pulse, and                            oxygen saturations were monitored continuously. The                            Endoscope was introduced through the mouth, and                            advanced to the second part of duodenum. The upper                            GI endoscopy was accomplished without difficulty.                            The patient tolerated the procedure well. Scope In: Scope Out: Findings:                 LA Grade C (one or more mucosal breaks continuous                            between tops of 2 or more mucosal folds, less than                            75% circumference) esophagitis with bleeding was                            found in the lower third of the esophagus. Biopsies                            were taken with a cold forceps for histology.                           Circumferential salmon-colored mucosa was present                            from 32 to 37 cm. The maximum longitudinal extent  of these esophageal mucosal changes was 5 cm in                            length. Mucosa was biopsied with a cold forceps for                            histology.                           A large hiatal hernia with multiple Cameron ulcers                            was found. The proximal extent of the gastric folds                            (end of  tubular esophagus) was 37 cm from the                            incisors. The hiatal narrowing was 43 cm from the                            incisors. The Z-line was 32 cm from the incisors.                           Localized inflammation characterized by congestion                            (edema), erosions, erythema and shallow ulcerations                            was found in the gastric antrum. Biopsies were                            taken with a cold forceps for histology.                           Localized mild inflammation characterized by                            congestion (edema) and erythema was found in the                            duodenal bulb. Biopsies were taken with a cold                            forceps for histology. Complications:            No immediate complications. Estimated Blood Loss:     Estimated blood loss was minimal. Impression:               - LA Grade C esophagitis with bleeding. Biopsied.                           - Salmon-colored mucosa suspicious for long-segment  Barrett's esophagus. Biopsied.                           - Large hiatal hernia with multiple Cameron ulcers.                           - Gastritis. Biopsied.                           - Duodenitis. Biopsied. Recommendation:           - Use Protonix (pantoprazole) 40 mg PO BID for 12                            weeks, then daily.                           - Await pathology results.                           - Repeat upper endoscopy in 2-3 months to check                            healing.                           - Perform a colonoscopy today. Sonny Masters "Lyndee Leo" [place],  10/28/2021 10:40:00 AM

## 2021-10-28 NOTE — Telephone Encounter (Signed)
Scheduled per 2/20 los, pt has been called and confirmed  °

## 2021-10-28 NOTE — Progress Notes (Signed)
Report to PACU, RN, vss, BBS= Clear.  

## 2021-10-28 NOTE — Progress Notes (Signed)
VS-AG  Pt reports no BM since taking the Miralax prep. States he completed the prep as ordered and took 5 dulcolax tablets instead of 4. Enema given per MD orders and pt had large pieces of formed stool. Pt reports drinking about a half a bottle of water at 8am and was chewing gum at 9am when he was being checked in. Pt also reports he ate at 5am on Monday 10/27/21. He states he had a fried chicken patty, potato salad and apple pie. MD and CRNA made aware of all information. Plan to proceed with colonoscopy and EGD today.

## 2021-10-29 ENCOUNTER — Other Ambulatory Visit (HOSPITAL_COMMUNITY): Payer: Self-pay

## 2021-10-30 ENCOUNTER — Telehealth: Payer: Self-pay

## 2021-10-30 ENCOUNTER — Telehealth: Payer: Self-pay | Admitting: *Deleted

## 2021-10-30 NOTE — Telephone Encounter (Signed)
No answer on follow up call. 

## 2021-10-30 NOTE — Telephone Encounter (Signed)
First attempt, left VM.  

## 2021-10-31 ENCOUNTER — Encounter: Payer: Self-pay | Admitting: Internal Medicine

## 2021-11-03 ENCOUNTER — Encounter (HOSPITAL_COMMUNITY): Payer: Self-pay

## 2021-11-03 ENCOUNTER — Ambulatory Visit (HOSPITAL_COMMUNITY): Admit: 2021-11-03 | Payer: Self-pay | Admitting: Internal Medicine

## 2021-11-03 SURGERY — ESOPHAGOGASTRODUODENOSCOPY (EGD) WITH PROPOFOL
Anesthesia: Monitor Anesthesia Care

## 2021-11-04 ENCOUNTER — Other Ambulatory Visit: Payer: Self-pay | Admitting: Physician Assistant

## 2021-11-07 ENCOUNTER — Other Ambulatory Visit: Payer: Self-pay

## 2021-11-07 DIAGNOSIS — D508 Other iron deficiency anemias: Secondary | ICD-10-CM

## 2021-11-10 ENCOUNTER — Other Ambulatory Visit: Payer: Self-pay | Admitting: Physician Assistant

## 2021-11-10 ENCOUNTER — Telehealth: Payer: Self-pay | Admitting: Physician Assistant

## 2021-11-10 ENCOUNTER — Ambulatory Visit (HOSPITAL_COMMUNITY)
Admission: RE | Admit: 2021-11-10 | Discharge: 2021-11-10 | Disposition: A | Discharge status: Home / Self Care | Payer: Self-pay | Source: Ambulatory Visit | Attending: Physician Assistant | Admitting: Physician Assistant

## 2021-11-10 ENCOUNTER — Inpatient Hospital Stay: Payer: Self-pay | Attending: Hematology and Oncology

## 2021-11-10 ENCOUNTER — Other Ambulatory Visit: Payer: Self-pay

## 2021-11-10 DIAGNOSIS — N289 Disorder of kidney and ureter, unspecified: Secondary | ICD-10-CM

## 2021-11-10 DIAGNOSIS — D509 Iron deficiency anemia, unspecified: Secondary | ICD-10-CM | POA: Insufficient documentation

## 2021-11-10 DIAGNOSIS — D508 Other iron deficiency anemias: Secondary | ICD-10-CM

## 2021-11-10 LAB — CBC WITH DIFFERENTIAL (CANCER CENTER ONLY)
Abs Immature Granulocytes: 0.05 10*3/uL (ref 0.00–0.07)
Basophils Absolute: 0.1 10*3/uL (ref 0.0–0.1)
Basophils Relative: 1 %
Eosinophils Absolute: 0.6 10*3/uL — ABNORMAL HIGH (ref 0.0–0.5)
Eosinophils Relative: 6 %
HCT: 33.5 % — ABNORMAL LOW (ref 39.0–52.0)
Hemoglobin: 9.8 g/dL — ABNORMAL LOW (ref 13.0–17.0)
Immature Granulocytes: 1 %
Lymphocytes Relative: 11 %
Lymphs Abs: 1 10*3/uL (ref 0.7–4.0)
MCH: 22.3 pg — ABNORMAL LOW (ref 26.0–34.0)
MCHC: 29.3 g/dL — ABNORMAL LOW (ref 30.0–36.0)
MCV: 76.1 fL — ABNORMAL LOW (ref 80.0–100.0)
Monocytes Absolute: 0.8 10*3/uL (ref 0.1–1.0)
Monocytes Relative: 9 %
Neutro Abs: 6.7 10*3/uL (ref 1.7–7.7)
Neutrophils Relative %: 72 %
Platelet Count: 468 10*3/uL — ABNORMAL HIGH (ref 150–400)
RBC: 4.4 MIL/uL (ref 4.22–5.81)
RDW: 21.9 % — ABNORMAL HIGH (ref 11.5–15.5)
WBC Count: 9.2 10*3/uL (ref 4.0–10.5)
nRBC: 0 % (ref 0.0–0.2)

## 2021-11-10 LAB — IRON AND IRON BINDING CAPACITY (CC-WL,HP ONLY)
Iron: 20 ug/dL — ABNORMAL LOW (ref 45–182)
Saturation Ratios: 4 % — ABNORMAL LOW (ref 17.9–39.5)
TIBC: 508 ug/dL — ABNORMAL HIGH (ref 250–450)
UIBC: 488 ug/dL — ABNORMAL HIGH (ref 117–376)

## 2021-11-10 LAB — FERRITIN: Ferritin: 19 ng/mL — ABNORMAL LOW (ref 24–336)

## 2021-11-10 NOTE — Telephone Encounter (Signed)
Scheduled per 3/6 secure chat, message has been left with pt ?

## 2021-11-17 ENCOUNTER — Other Ambulatory Visit: Payer: Self-pay

## 2021-11-17 ENCOUNTER — Inpatient Hospital Stay: Payer: Self-pay

## 2021-11-17 ENCOUNTER — Encounter: Payer: Self-pay | Admitting: Physician Assistant

## 2021-11-17 DIAGNOSIS — D508 Other iron deficiency anemias: Secondary | ICD-10-CM

## 2021-11-17 LAB — SAMPLE TO BLOOD BANK

## 2021-11-17 LAB — CBC WITH DIFFERENTIAL (CANCER CENTER ONLY)
Abs Immature Granulocytes: 0.05 10*3/uL (ref 0.00–0.07)
Basophils Absolute: 0.1 10*3/uL (ref 0.0–0.1)
Basophils Relative: 1 %
Eosinophils Absolute: 0.6 10*3/uL — ABNORMAL HIGH (ref 0.0–0.5)
Eosinophils Relative: 6 %
HCT: 32.5 % — ABNORMAL LOW (ref 39.0–52.0)
Hemoglobin: 9.5 g/dL — ABNORMAL LOW (ref 13.0–17.0)
Immature Granulocytes: 1 %
Lymphocytes Relative: 9 %
Lymphs Abs: 0.9 10*3/uL (ref 0.7–4.0)
MCH: 22.1 pg — ABNORMAL LOW (ref 26.0–34.0)
MCHC: 29.2 g/dL — ABNORMAL LOW (ref 30.0–36.0)
MCV: 75.6 fL — ABNORMAL LOW (ref 80.0–100.0)
Monocytes Absolute: 0.8 10*3/uL (ref 0.1–1.0)
Monocytes Relative: 8 %
Neutro Abs: 7.4 10*3/uL (ref 1.7–7.7)
Neutrophils Relative %: 75 %
Platelet Count: 376 10*3/uL (ref 150–400)
RBC: 4.3 MIL/uL (ref 4.22–5.81)
RDW: 21.2 % — ABNORMAL HIGH (ref 11.5–15.5)
WBC Count: 9.8 10*3/uL (ref 4.0–10.5)
nRBC: 0 % (ref 0.0–0.2)

## 2021-11-18 ENCOUNTER — Inpatient Hospital Stay: Payer: Self-pay

## 2021-11-18 ENCOUNTER — Encounter: Payer: Self-pay | Admitting: Physician Assistant

## 2021-11-18 VITALS — BP 148/82 | HR 78 | Temp 98.7°F | Resp 17

## 2021-11-18 DIAGNOSIS — D508 Other iron deficiency anemias: Secondary | ICD-10-CM

## 2021-11-18 MED ORDER — SODIUM CHLORIDE 0.9 % IV SOLN: Freq: Once | INTRAVENOUS | Status: AC

## 2021-11-18 MED ORDER — SODIUM CHLORIDE 0.9 % IV SOLN
1000.0000 mg | Freq: Once | INTRAVENOUS | Status: AC
  Administered 2021-11-18: 1000 mg via INTRAVENOUS
  Filled 2021-11-18: qty 10

## 2021-11-18 NOTE — Patient Instructions (Signed)

## 2021-11-18 NOTE — Progress Notes (Signed)
Pt declined to stay during 30 minute observation period. Pt is stable.  ?Pt also expressed concerns of increase in shortness of breath. Murray Hodgkins, [place] notified. Advised patient to monitor symptoms at home post infusion today and reach other if anything changes, per Murray Hodgkins, [place] he may need a pulmonology consult. Pt verbalized understanding.  ?

## 2021-11-21 ENCOUNTER — Encounter: Payer: Self-pay | Admitting: Physician Assistant

## 2021-11-21 ENCOUNTER — Telehealth: Payer: Self-pay

## 2021-11-21 ENCOUNTER — Other Ambulatory Visit: Payer: Self-pay | Admitting: Physician Assistant

## 2021-11-21 DIAGNOSIS — R0602 Shortness of breath: Secondary | ICD-10-CM

## 2021-11-21 NOTE — Telephone Encounter (Signed)
Patient sent a mychart message saying he continues to be short of breath, Since his hemoglobin levels are improving, I would recommend he go to the ER to evaluate for his breath ? ?Spoke with pt and he said he was not going to the ED. Wants to know if this is a side effect of the iron infusions.  I told him we can refer him to a pulmonologist and he said OK ? ?shortness of breath is not generally associated with the iron infusions.agree to make referral to pulmonology. can you send referral to [facility] pulmonology ? ?Yes. ?

## 2021-11-25 ENCOUNTER — Other Ambulatory Visit: Payer: Self-pay

## 2021-11-25 ENCOUNTER — Ambulatory Visit: Payer: Self-pay | Admitting: Physician Assistant

## 2021-12-08 ENCOUNTER — Encounter: Payer: Self-pay | Admitting: Physician Assistant

## 2021-12-10 ENCOUNTER — Encounter: Payer: Self-pay | Admitting: Physician Assistant

## 2021-12-10 ENCOUNTER — Encounter: Payer: Self-pay | Admitting: Internal Medicine

## 2021-12-11 ENCOUNTER — Encounter: Payer: Self-pay | Admitting: Family Medicine

## 2021-12-15 ENCOUNTER — Encounter: Payer: Self-pay | Admitting: Family Medicine

## 2021-12-15 ENCOUNTER — Other Ambulatory Visit: Payer: Self-pay | Admitting: Physician Assistant

## 2021-12-15 DIAGNOSIS — D508 Other iron deficiency anemias: Secondary | ICD-10-CM

## 2021-12-16 ENCOUNTER — Other Ambulatory Visit: Payer: Self-pay

## 2021-12-16 ENCOUNTER — Inpatient Hospital Stay (HOSPITAL_BASED_OUTPATIENT_CLINIC_OR_DEPARTMENT_OTHER): Payer: Self-pay | Admitting: Physician Assistant

## 2021-12-16 ENCOUNTER — Inpatient Hospital Stay: Payer: Self-pay | Attending: Hematology and Oncology

## 2021-12-16 VITALS — BP 147/87 | HR 109 | Temp 98.4°F | Resp 18 | Ht 71.0 in | Wt 329.5 lb

## 2021-12-16 DIAGNOSIS — K2091 Esophagitis, unspecified with bleeding: Secondary | ICD-10-CM | POA: Insufficient documentation

## 2021-12-16 DIAGNOSIS — K259 Gastric ulcer, unspecified as acute or chronic, without hemorrhage or perforation: Secondary | ICD-10-CM | POA: Insufficient documentation

## 2021-12-16 DIAGNOSIS — K449 Diaphragmatic hernia without obstruction or gangrene: Secondary | ICD-10-CM | POA: Insufficient documentation

## 2021-12-16 DIAGNOSIS — D508 Other iron deficiency anemias: Secondary | ICD-10-CM

## 2021-12-16 DIAGNOSIS — K648 Other hemorrhoids: Secondary | ICD-10-CM | POA: Insufficient documentation

## 2021-12-16 DIAGNOSIS — K2980 Duodenitis without bleeding: Secondary | ICD-10-CM | POA: Insufficient documentation

## 2021-12-16 DIAGNOSIS — Z79899 Other long term (current) drug therapy: Secondary | ICD-10-CM | POA: Insufficient documentation

## 2021-12-16 DIAGNOSIS — K2970 Gastritis, unspecified, without bleeding: Secondary | ICD-10-CM | POA: Insufficient documentation

## 2021-12-16 DIAGNOSIS — R635 Abnormal weight gain: Secondary | ICD-10-CM | POA: Insufficient documentation

## 2021-12-16 DIAGNOSIS — D500 Iron deficiency anemia secondary to blood loss (chronic): Secondary | ICD-10-CM | POA: Insufficient documentation

## 2021-12-16 LAB — CBC WITH DIFFERENTIAL (CANCER CENTER ONLY)
Abs Immature Granulocytes: 0.05 10*3/uL (ref 0.00–0.07)
Basophils Absolute: 0.1 10*3/uL (ref 0.0–0.1)
Basophils Relative: 1 %
Eosinophils Absolute: 0.6 10*3/uL — ABNORMAL HIGH (ref 0.0–0.5)
Eosinophils Relative: 6 %
HCT: 40.6 % (ref 39.0–52.0)
Hemoglobin: 12.4 g/dL — ABNORMAL LOW (ref 13.0–17.0)
Immature Granulocytes: 1 %
Lymphocytes Relative: 10 %
Lymphs Abs: 0.9 10*3/uL (ref 0.7–4.0)
MCH: 23.4 pg — ABNORMAL LOW (ref 26.0–34.0)
MCHC: 30.5 g/dL (ref 30.0–36.0)
MCV: 76.6 fL — ABNORMAL LOW (ref 80.0–100.0)
Monocytes Absolute: 0.8 10*3/uL (ref 0.1–1.0)
Monocytes Relative: 9 %
Neutro Abs: 7 10*3/uL (ref 1.7–7.7)
Neutrophils Relative %: 73 %
Platelet Count: 356 10*3/uL (ref 150–400)
RBC: 5.3 MIL/uL (ref 4.22–5.81)
RDW: 22.7 % — ABNORMAL HIGH (ref 11.5–15.5)
WBC Count: 9.4 10*3/uL (ref 4.0–10.5)
nRBC: 0 % (ref 0.0–0.2)

## 2021-12-16 LAB — SAMPLE TO BLOOD BANK

## 2021-12-16 LAB — FERRITIN: Ferritin: 53 ng/mL (ref 24–336)

## 2021-12-16 LAB — IRON AND IRON BINDING CAPACITY (CC-WL,HP ONLY)
Iron: 51 ug/dL (ref 45–182)
Saturation Ratios: 13 % — ABNORMAL LOW (ref 17.9–39.5)
TIBC: 403 ug/dL (ref 250–450)
UIBC: 352 ug/dL (ref 117–376)

## 2021-12-16 NOTE — Progress Notes (Signed)
?[place] ?[phone]   [phone] ? ?PROGRESS NOTE ? ?Patient Care Team: ?Kinnie Feil, MD as PCP - General (Family Medicine) ? ?Hematological/Oncological History ? ?#Iron deficiency anemia 2/2 to Presume GI Bleeding ?1) 03/14/2021: TIBC 349, Iron 30 (L), Iron saturation 9% (L), Ferritin 15 (L), Vitamin B12 537, Folate 7.4, WBC 8.6, Hgb 10.7 (L), MCV 74 (L), Plt 450.  ? ?2) 05/07/2021: Establish care with Dede Query PA-C ? ?3) 05/26/2021-06/20/2021: Received IV venofer 200 mg once a week x 5 doses ? ?4) 07/29/2021-08/26/2021: Received IV venofer 200 mg once a week x 5 doses ? ?5) 10/13/2021: Received IV monoferric 1000 mg x 1 dose.  ? ?6) 10/28/2021: EGD showed esophagitis with bleeding, large haital hernia with multiple cameron ulcers, gastritis, duodenitis.Colonoscopy was incomplete due to poor prep. Evidence of non-bleeding internal hemorrhoids.  ? ?7) 11/18/2021: Received IV monoferric 1000 mg x 1 dose ? ?HISTORY OF PRESENTING ILLNESS:  ?[name] 56 y.o. male returns for follow-up for iron deficiency anemia.  His last visit was on 10/27/2021 by Dr. Lorenso Courier.  In the interim since his last visit he received another dose of IV monoferric.  ? ?On exam today, [name] reports that even with the IV iron infusions, he continues to be extremely fatigued and sleeps the majority of the day.  He continues to gain weight but his oral intake has not changed.  He has some abdominal bloating but denies any pain.  He denies nausea, vomiting or any bowel habit changes.  He denies any bruising or signs of active bleeding.  This includes melena or hematochezia..  Patient reports shortness of breath with exertion.  He has noticed night sweats for couple of weeks.  He has some swelling in his joints.  He denies fevers, chills, chest pain or cough.  He has no other complaints. Rest of the 10 point ROS is below.  ? ?MEDICAL HISTORY:  ?Past Medical History:  ?Diagnosis Date  ? ADD  (attention deficit disorder)   ? ADD (attention deficit disorder)   ? Allergy   ? Anemia, iron deficiency   ? Anxiety   ? Asthma   ? Blood transfusion   ? Constipation   ? Depression   ? Depression   ? Exertional dyspnea   ? secondary to anemia  ? GERD (gastroesophageal reflux disease)   ? Hiatal hernia   ? Narcolepsy   ? pt also reports episodes of passing out that are unrelated to narcolepsy about a year ago  ? Sleep apnea 01/2018  ? has a CPAP, "unable to use it"  ? ? ?SURGICAL HISTORY: ?Past Surgical History:  ?Procedure Laterality Date  ? COLONOSCOPY    ? UPPER GASTROINTESTINAL ENDOSCOPY    ? ? ?SOCIAL HISTORY: ?Social History  ? ?Socioeconomic History  ? Marital status: Single  ?  Spouse name: Not on file  ? Number of children: 0  ? Years of education: Not on file  ? Highest education level: Not on file  ?Occupational History  ? Occupation: student-Business  ?Tobacco Use  ? Smoking status: Never  ? Smokeless tobacco: Never  ?Vaping Use  ? Vaping Use: Never used  ?Substance and Sexual Activity  ? Alcohol use: No  ? Drug use: No  ? Sexual activity: Not Currently  ?Other Topics Concern  ? Not on file  ?Social History Narrative  ? Lives alone. Never married, no children.  Has two sister who live in [place].  Student at work  study.  Early [place].  Working toward Engineer, technical sales.  Previously worked in Engineer, manufacturing systems.  Was also previously homeless due to job loss and psychiatric exacerbation.  ? ?Social Determinants of Health  ? ?Financial Resource Strain: High Risk  ? Difficulty of Paying Living Expenses: Hard  ?Food Insecurity: Food Insecurity Present  ? Worried About Charity fundraiser in the Last Year: Sometimes true  ? Ran Out of Food in the Last Year: Sometimes true  ?Transportation Needs: Unmet Transportation Needs  ? Lack of Transportation (Medical): Yes  ? Lack of Transportation (Non-Medical): Yes  ?Physical Activity: Not on file  ?Stress: Not on file  ?Social Connections: Not on file   ?Intimate Partner Violence: Not on file  ? ? ?FAMILY HISTORY: ?Family History  ?Problem Relation Age of Onset  ? Diabetes Paternal Grandfather   ? Cancer Neg Hx   ? Stroke Neg Hx   ? Breast cancer Neg Hx   ? Celiac disease Neg Hx   ? Cirrhosis Neg Hx   ? Clotting disorder Neg Hx   ? Colitis Neg Hx   ? Colon cancer Neg Hx   ? Colon polyps Neg Hx   ? Crohn's disease Neg Hx   ? Cystic fibrosis Neg Hx   ? Esophageal cancer Neg Hx   ? Heart disease Neg Hx   ? Hemochromatosis Neg Hx   ? Inflammatory bowel disease Neg Hx   ? Irritable bowel syndrome Neg Hx   ? Kidney disease Neg Hx   ? Liver cancer Neg Hx   ? Liver disease Neg Hx   ? Ovarian cancer Neg Hx   ? Pancreatic cancer Neg Hx   ? Prostate cancer Neg Hx   ? Rectal cancer Neg Hx   ? Stomach cancer Neg Hx   ? Ulcerative colitis Neg Hx   ? Uterine cancer Neg Hx   ? Wilson's disease Neg Hx   ? ? ?ALLERGIES:  has No Known Allergies. ? ?MEDICATIONS:  ?Current Outpatient Medications  ?Medication Sig Dispense Refill  ? albuterol (VENTOLIN HFA) 108 (90 Base) MCG/ACT inhaler Inhale 1-2 puffs into the lungs every 6 (six) hours as needed for wheezing or shortness of breath. 18 g 1  ? fluticasone furoate-vilanterol (BREO ELLIPTA) 100-25 MCG/INH AEPB Inhale 1 puff into the lungs daily. 1 each 0  ? pantoprazole (PROTONIX) 40 MG tablet Take 1 tablet (40 mg total) by mouth 2 (two) times daily. 90 tablet 3  ? ?No current facility-administered medications for this visit.  ? ? ?REVIEW OF SYSTEMS:   ?Constitutional: ( - ) fevers, ( - )  chills , ( - ) night sweats ?Eyes: ( - ) blurriness of vision, ( - ) double vision, ( - ) watery eyes ?Ears, nose, mouth, throat, and face: ( - ) mucositis, ( - ) sore throat ?Respiratory: ( - ) cough, ( + ) dyspnea, ( - ) wheezes ?Cardiovascular: ( - ) palpitation, ( + ) chest discomfort, ( - ) lower extremity swelling ?Gastrointestinal:  ( - ) nausea, ( - ) heartburn, ( - ) change in bowel habits ?Skin: ( - ) abnormal skin rashes ?Lymphatics: ( - )  new lymphadenopathy, ( - ) easy bruising ?Neurological: ( - ) numbness, ( - ) tingling, ( - ) new weaknesses ?Behavioral/Psych: ( - ) mood change, ( - ) new changes  ?All other systems were reviewed with the patient and are negative. ? ?PHYSICAL EXAMINATION: ?ECOG PERFORMANCE STATUS: 2 - Symptomatic, <50%  confined to bed ? ?Vitals:  ? 12/16/21 1310  ?BP: (!) 147/87  ?Pulse: (!) 109  ?Resp: 18  ?Temp: 98.4 ?F (36.9 ?C)  ?SpO2: 96%  ? ?Filed Weights  ? 12/16/21 1310  ?Weight: (!) 329 lb 8 oz (149.5 kg)  ? ? ?GENERAL: well appearing male in NAD, obese ?SKIN: skin color, texture, turgor are normal, no rashes or significant lesions ?EYES: conjunctiva are pink and non-injected, sclera clear ?LUNGS: clear to auscultation and percussion with normal breathing effort ?HEART:  Tachycardic.Regular rhythm and no murmurs and no lower extremity edema. ?Musculoskeletal: no cyanosis of digits and no clubbing  ?PSYCH: alert & oriented x 3, fluent speech ?NEURO: no focal motor/sensory deficits ? ?LABORATORY DATA:  ?I have reviewed the data as listed ? ?  Latest Ref Rng & Units 12/16/2021  ? 12:53 PM 11/17/2021  ?  2:39 PM 11/10/2021  ?  9:25 AM  ?CBC  ?WBC 4.0 - 10.5 K/uL 9.4   9.8   9.2    ?Hemoglobin 13.0 - 17.0 g/dL 12.4   9.5   9.8    ?Hematocrit 39.0 - 52.0 % 40.6   32.5   33.5    ?Platelets 150 - 400 K/uL 356   376   468    ? ? ? ?  Latest Ref Rng & Units 10/27/2021  ? 10:06 AM 09/30/2021  ?  8:17 AM 05/07/2021  ?  3:22 PM  ?CMP  ?Glucose 70 - 99 mg/dL 122   114   98    ?BUN 6 - 20 mg/dL 19   24   24     ?Creatinine 0.61 - 1.24 mg/dL 1.06   1.27   1.11    ?Sodium 135 - 145 mmol/L 139   139   140    ?Potassium 3.5 - 5.1 mmol/L 4.2   4.4   4.6    ?Chloride 98 - 111 mmol/L 106   110   106    ?CO2 22 - 32 mmol/L 26   23   25     ?Calcium 8.9 - 10.3 mg/dL 8.8   8.6   9.2    ?Total Protein 6.5 - 8.1 g/dL 6.9   6.7   7.1    ?Total Bilirubin 0.3 - 1.2 mg/dL 0.3   0.2   <0.2    ?Alkaline Phos 38 - 126 U/L 102   93   81    ?AST 15 - 41 U/L 17   11    33    ?ALT 0 - 44 U/L 14   9   60    ? ? ?ASSESSMENT & PLAN ?AQUARIUS GEISSLER is a 56 y.o. male returns for follow-up for iron deficiency anemia. ? ?#Iron deficiency anemia 2/2 GI bleeding: ?--Established care

## 2021-12-17 ENCOUNTER — Encounter: Payer: Self-pay | Admitting: Physician Assistant

## 2021-12-17 ENCOUNTER — Encounter: Payer: Self-pay | Admitting: Family Medicine

## 2021-12-17 DIAGNOSIS — N281 Cyst of kidney, acquired: Secondary | ICD-10-CM | POA: Insufficient documentation

## 2021-12-18 ENCOUNTER — Encounter: Payer: Self-pay | Admitting: Physician Assistant

## 2021-12-19 ENCOUNTER — Ambulatory Visit (INDEPENDENT_AMBULATORY_CARE_PROVIDER_SITE_OTHER): Payer: Self-pay | Admitting: Family Medicine

## 2021-12-19 ENCOUNTER — Encounter: Payer: Self-pay | Admitting: Family Medicine

## 2021-12-19 ENCOUNTER — Telehealth: Payer: Self-pay | Admitting: Family Medicine

## 2021-12-19 VITALS — BP 138/87 | HR 98 | Ht 71.0 in | Wt 331.8 lb

## 2021-12-19 DIAGNOSIS — R5383 Other fatigue: Secondary | ICD-10-CM

## 2021-12-19 DIAGNOSIS — J449 Chronic obstructive pulmonary disease, unspecified: Secondary | ICD-10-CM | POA: Insufficient documentation

## 2021-12-19 DIAGNOSIS — R7309 Other abnormal glucose: Secondary | ICD-10-CM

## 2021-12-19 DIAGNOSIS — B372 Candidiasis of skin and nail: Secondary | ICD-10-CM | POA: Insufficient documentation

## 2021-12-19 DIAGNOSIS — J45909 Unspecified asthma, uncomplicated: Secondary | ICD-10-CM | POA: Insufficient documentation

## 2021-12-19 DIAGNOSIS — R7303 Prediabetes: Secondary | ICD-10-CM

## 2021-12-19 LAB — POCT GLYCOSYLATED HEMOGLOBIN (HGB A1C): Hemoglobin A1C: 5.3 % (ref 4.0–5.6)

## 2021-12-19 MED ORDER — NYSTATIN 100000 UNIT/GM EX POWD: 1.0000 "application " | Freq: Two times a day (BID) | CUTANEOUS | 0 refills | Status: DC

## 2021-12-19 NOTE — Assessment & Plan Note (Signed)
PFT from 2016 shows mild obstructive lung dx. ?Normal pulm exam. ?His weight might be contributing to his SOB. ?Consider cardiovascular disease - ECHO discussed. ?However, due to lack of insurance, he would need to pay out of pocket. ?He will let me know if he wishes to proceed with out of pocket payment. ?Otherwise f/u with pulm as planned. ? ?NB; At the beginning of the visit, I reminded him to call [employer] at [phone] for his Breo. He is aware , but yet to contact them. I advised, that I will check if we have samples.  ?However, due to many other issues discussed today, we did not close that loop. ?I have messaged him via MyChart to come in and pick up his Breo samples. ?He agreed with the plan. ? ?

## 2021-12-19 NOTE — Assessment & Plan Note (Addendum)
Likely related to calorie intake. ??? Side effect to iron infusion. ?Unable to tolerate exercise due to health condition. ?Discussed referral to a dietitian. ?I gave him Dr. Jenne Campus card to call for an appointment and also placed a referral. ?As discussed, we will consider SGLT2 in the future. ? ?

## 2021-12-19 NOTE — Telephone Encounter (Signed)
Hello Blue team CMA, ? ?Please contact patient regarding Breo ellipta samples. Let him know it is ready for pick up. ?I will place sample order whenever he comes in to pick up med. Do let me know when he picks up to close the loop. Thanks. ?

## 2021-12-19 NOTE — Patient Instructions (Signed)
Psychiatry Resource List (Adults and Children) Most of these providers will take Medicaid. please consult your insurance for a complete and updated list of available providers. When calling to make an appointment have your insurance information available to confirm you are covered.   [facility]:Psychiatry and Counseling [address] [place], [place] [address] [phone]  [facility]  [address] [place], [place] [phone] Crisis [phone]   [facility]:   [place]: [address]     [phone]   [place]: [address],        [phone] [place]: [address],    [phone] [place]: [address],                   [phone] Children: [facility] [address]         [phone]  [facility] (virtual only) [phone]    [facility]  (Psychiatry only; Adults /children 12 and over, will take Medicaid)  [address], [place], [place] [address]       [phone]   [facility] (Psychiatry & counseling ; adults & children ; will take Medicaid [address]  [place] [address]  Go on-line to complete referral ( [email or web address] [phone]    (Spanish speaking therapists)  [facility]  Psychiatry & counseling; Adults and children;  Call Registration prior to scheduling an appointment [phone] [address]    [place], [place] [address]    [phone]  [facility] (Psychiatry & counseling; adults & children; Medicare no Medicaid)  [address]   [place], [place]  [address]      [phone]    [facility] (up to age 21)  Psychiatry & counseling ,will take Medicaid, must do counseling to receive psychiatry services  [address] [place]  [place] [address]        [phone]  [facility] (Psychiatry & counseling; adults & children; will take Medicaid) Will need a referral from provider [address],  [place], [place]  [phone]   [facility] --- Walk-In Mon-Friday 8am-3pm ( will take Medicaid, Psychiatry, Adults & children,  [address], [place], [place]   [phone]   [facility]--, Walk-in M-F 8am-12pm and 1pm -3pm   (Counseling, Psychiatry, will take Medicaid, adults & children)  [address], [place],   [phone]   

## 2021-12-19 NOTE — Progress Notes (Signed)
? ? ?SUBJECTIVE:  ? ?CHIEF COMPLAINT / HPI:  ? ?DM screen: ?C/O fruity smell and worries about DM2. He wishes to be tested for this.  ? ?Weight gain:  ?C/O rapid weight gain since he was given a different form of iron infusion he had never received. He had gained 30 lbs since the infusion. He endorses not getting enough exercise due to his breathing and fatigue. He is on food stamps and is often unable to afford healthy food. ?Depression: No. Counseling ok. ? ?SOB/Fatigue/Asthma:  ?He c/o persistent fatigue despite anemia treatment. He also c/o SOB at rest and on Exertion. No chest pain, no leg swelling. He also stated that he is almost out of his Breo. He has f/u with pulmonology on 12/23/21. ? ?Groin pain: ?C/O pelvic and groin area pain due to his weight. He feels tight in his lower abdomen and thigh area. Otherwise, no GU symptoms.  ? ? ?PERTINENT  PMH / PSH: PMHx reviewed. ? ?OBJECTIVE:  ? ?Vitals:  ? 12/19/21 0925  ?BP: 138/87  ?Pulse: 98  ?SpO2: 97%  ?Weight: (!) 331 lb 12.8 oz (150.5 kg)  ?Height: 5\' 11"  (1.803 m)  ?Body mass index is 46.28 kg/m?. ? ? ?Physical Exam ?Vitals and nursing note reviewed. Exam conducted with a chaperone present ).  ?Constitutional:   ?   Appearance: He is obese.  ?Cardiovascular:  ?   Rate and Rhythm: Normal rate and regular rhythm.  ?   Heart sounds: Normal heart sounds. No murmur heard. ?Pulmonary:  ?   Effort: Pulmonary effort is normal. No respiratory distress.  ?   Breath sounds: Normal breath sounds. No wheezing.  ?Abdominal:  ?   General: Bowel sounds are normal.  ?   Tenderness: There is no abdominal tenderness.  ?   Comments: Obese  ?Genitourinary: ?   Comments: + mildly erythematous/pink lesion on his lower abdominal fold over his pelvis. Otherwise, benign exam. ?Musculoskeletal:  ?   Right lower leg: No edema.  ?   Left lower leg: No edema.  ?Neurological:  ?   Mental Status: He is alert.  ? ? ? ?ASSESSMENT/PLAN:  ?DM screen: ?A1C 5.3. ?Patient  reassured, he does not have DM. ? ?Morbid obesity (HCC) ?Likely related to calorie intake. ??? Side effect to iron infusion. ?Unable to tolerate exercise due to health condition. ?Discussed referral to a dietitian. ?I gave him Dr. Cleatrice Burke card to call for an appointment and also placed a referral. ?As discussed, we will consider SGLT2 in the future. ? ? ?Obstructive lung disease (HCC) ?PFT from 2016 shows mild obstructive lung dx. ?Normal pulm exam. ?His weight might be contributing to his SOB. ?Consider cardiovascular disease - ECHO discussed. ?However, due to lack of insurance, he would need to pay out of pocket. ?He will let me know if he wishes to proceed with out of pocket payment. ?Otherwise f/u with pulm as planned. ? ?NB; At the beginning of the visit, I reminded him to call [facility] at [phone] for his Breo. He is aware , but yet to contact them. I advised, that I will check if we have samples.  ?However, due to many other issues discussed today, we did not close that loop. ?I have messaged him via MyChart to come in and pick up his Breo samples. ?He agreed with the plan. ? ? ?Chronic fatigue ?Likely chronic fatigue syndrome vs related to his OSA ?He is supposed to wear CPAP. However, due to his homeless status he is  unable to implement this recommendation. ?TSH and Vitamin B12 checked today. ?His most recent hemoglobin was normal. ?I will contact him with results. ?Exercise as tolerated. ? ?Intertriginous candidiasis ?Groin exam benign except for candida intertrigo. ?His groin discomfort as well as intertrigo might be due to his weight. ?Nystatin powder prescribed. ?Monitor closely for now. ?F/U as needed. ?  ? ?Janit Pagan, MD ?[facility]  ? ?

## 2021-12-19 NOTE — Assessment & Plan Note (Signed)
Likely chronic fatigue syndrome vs related to his OSA ?He is supposed to wear CPAP. However, due to his homeless status he is unable to implement this recommendation. ?TSH and Vitamin B12 checked today. ?His most recent hemoglobin was normal. ?I will contact him with results. ?Exercise as tolerated. ?

## 2021-12-19 NOTE — Assessment & Plan Note (Signed)
Groin exam benign except for candida intertrigo. ?His groin discomfort as well as intertrigo might be due to his weight. ?Nystatin powder prescribed. ?Monitor closely for now. ?F/U as needed. ?

## 2021-12-20 LAB — VITAMIN B12: Vitamin B-12: 388 pg/mL (ref 232–1245)

## 2021-12-20 LAB — TSH: TSH: 1.61 u[IU]/mL (ref 0.450–4.500)

## 2021-12-22 ENCOUNTER — Other Ambulatory Visit (HOSPITAL_COMMUNITY): Payer: Self-pay

## 2021-12-22 ENCOUNTER — Encounter: Payer: Self-pay | Admitting: Physician Assistant

## 2021-12-22 MED ORDER — NYSTATIN 100000 UNIT/GM EX POWD
CUTANEOUS | 0 refills | Status: DC
  Filled 2021-12-22: qty 15, 30d supply, fill #0

## 2021-12-23 ENCOUNTER — Encounter: Payer: Self-pay | Admitting: Pulmonary Disease

## 2021-12-23 ENCOUNTER — Ambulatory Visit (INDEPENDENT_AMBULATORY_CARE_PROVIDER_SITE_OTHER): Payer: Self-pay | Admitting: Pulmonary Disease

## 2021-12-23 VITALS — BP 138/76 | HR 109 | Temp 98.0°F | Ht 71.0 in | Wt 330.0 lb

## 2021-12-23 DIAGNOSIS — R0609 Other forms of dyspnea: Secondary | ICD-10-CM

## 2021-12-23 NOTE — Patient Instructions (Addendum)
Nice to meet you ? ?I think we need more information to further investigate your breathing.  I do suspect the weight gain is contributing but I want to make sure that were not missing something else.  I worry the weight gain is actually from fluid backup from the heart.  To further investigate this I recommend a echocardiogram ultrasound the heart.  In addition, we should perform pulmonary function tests to make sure not missing any other issue in the lungs.  I think these will be reassuring as your recent CT scan was pristine. ? ?I will send a message to the social worker in the hematology office for further help or recommendations with everything. ? ?Return to clinic in 2 months or sooner as needed with Dr. Judeth Horn ?

## 2021-12-26 ENCOUNTER — Encounter: Payer: Self-pay | Admitting: Internal Medicine

## 2022-01-06 ENCOUNTER — Inpatient Hospital Stay: Payer: Self-pay

## 2022-01-06 ENCOUNTER — Ambulatory Visit (HOSPITAL_COMMUNITY): Payer: Self-pay | Attending: Internal Medicine

## 2022-01-06 DIAGNOSIS — R0609 Other forms of dyspnea: Secondary | ICD-10-CM | POA: Insufficient documentation

## 2022-01-06 LAB — ECHOCARDIOGRAM COMPLETE
Area-P 1/2: 3.77 cm2
S' Lateral: 2.9 cm

## 2022-01-06 NOTE — Progress Notes (Signed)
Echocardiogram is reassuring, largely normal.

## 2022-01-08 ENCOUNTER — Ambulatory Visit: Payer: Self-pay | Admitting: Family Medicine

## 2022-01-13 ENCOUNTER — Other Ambulatory Visit: Payer: Self-pay

## 2022-01-13 ENCOUNTER — Other Ambulatory Visit: Payer: Self-pay | Admitting: Physician Assistant

## 2022-01-13 ENCOUNTER — Inpatient Hospital Stay: Payer: Self-pay | Attending: Hematology and Oncology

## 2022-01-13 VITALS — BP 140/84 | HR 97 | Temp 98.5°F | Resp 18 | Wt 322.8 lb

## 2022-01-13 DIAGNOSIS — K922 Gastrointestinal hemorrhage, unspecified: Secondary | ICD-10-CM | POA: Insufficient documentation

## 2022-01-13 DIAGNOSIS — D500 Iron deficiency anemia secondary to blood loss (chronic): Secondary | ICD-10-CM | POA: Insufficient documentation

## 2022-01-13 DIAGNOSIS — D508 Other iron deficiency anemias: Secondary | ICD-10-CM

## 2022-01-13 MED ORDER — SODIUM CHLORIDE 0.9 % IV SOLN: Freq: Once | INTRAVENOUS | Status: AC

## 2022-01-13 MED ORDER — SODIUM CHLORIDE 0.9 % IV SOLN
1000.0000 mg | Freq: Once | INTRAVENOUS | Status: AC
  Administered 2022-01-13: 1000 mg via INTRAVENOUS
  Filled 2022-01-13: qty 10

## 2022-01-13 NOTE — Progress Notes (Signed)
Pt declined 30 min post iron observation. VSS at discharge 

## 2022-01-29 ENCOUNTER — Encounter (HOSPITAL_COMMUNITY): Payer: Self-pay

## 2022-01-29 ENCOUNTER — Emergency Department (HOSPITAL_COMMUNITY)
Admission: EM | Admit: 2022-01-29 | Discharge: 2022-01-29 | Disposition: A | Discharge status: Home / Self Care | Payer: Self-pay | Attending: Emergency Medicine | Admitting: Emergency Medicine

## 2022-01-29 ENCOUNTER — Emergency Department (HOSPITAL_COMMUNITY): Payer: Self-pay

## 2022-01-29 ENCOUNTER — Other Ambulatory Visit: Payer: Self-pay

## 2022-01-29 DIAGNOSIS — R531 Weakness: Secondary | ICD-10-CM | POA: Insufficient documentation

## 2022-01-29 DIAGNOSIS — R0609 Other forms of dyspnea: Secondary | ICD-10-CM

## 2022-01-29 DIAGNOSIS — R791 Abnormal coagulation profile: Secondary | ICD-10-CM | POA: Insufficient documentation

## 2022-01-29 DIAGNOSIS — R000 Tachycardia, unspecified: Secondary | ICD-10-CM | POA: Insufficient documentation

## 2022-01-29 DIAGNOSIS — R0600 Dyspnea, unspecified: Secondary | ICD-10-CM | POA: Insufficient documentation

## 2022-01-29 DIAGNOSIS — R030 Elevated blood-pressure reading, without diagnosis of hypertension: Secondary | ICD-10-CM | POA: Insufficient documentation

## 2022-01-29 DIAGNOSIS — R0602 Shortness of breath: Secondary | ICD-10-CM | POA: Insufficient documentation

## 2022-01-29 DIAGNOSIS — R55 Syncope and collapse: Secondary | ICD-10-CM | POA: Insufficient documentation

## 2022-01-29 DIAGNOSIS — Z59819 Housing instability, housed unspecified: Secondary | ICD-10-CM

## 2022-01-29 LAB — BASIC METABOLIC PANEL
Anion gap: 5 (ref 5–15)
BUN: 23 mg/dL — ABNORMAL HIGH (ref 6–20)
CO2: 24 mmol/L (ref 22–32)
Calcium: 8.7 mg/dL — ABNORMAL LOW (ref 8.9–10.3)
Chloride: 109 mmol/L (ref 98–111)
Creatinine, Ser: 1.06 mg/dL (ref 0.61–1.24)
GFR, Estimated: >60 mL/min (ref >60)
Glucose, Bld: 101 mg/dL — ABNORMAL HIGH (ref 70–99)
Potassium: 4.1 mmol/L (ref 3.5–5.1)
Sodium: 138 mmol/L (ref 135–145)

## 2022-01-29 LAB — URINALYSIS, ROUTINE W REFLEX MICROSCOPIC
Bacteria, UA: NONE SEEN
Bilirubin Urine: NEGATIVE
Glucose, UA: NEGATIVE mg/dL
Hgb urine dipstick: NEGATIVE
Ketones, ur: NEGATIVE mg/dL
Leukocytes,Ua: NEGATIVE
Nitrite: NEGATIVE
Protein, ur: NEGATIVE mg/dL
Specific Gravity, Urine: 1.017 (ref 1.005–1.030)
pH: 6 (ref 5.0–8.0)

## 2022-01-29 LAB — CBC
HCT: 39.7 % (ref 39.0–52.0)
Hemoglobin: 13 g/dL (ref 13.0–17.0)
MCH: 25.9 pg — ABNORMAL LOW (ref 26.0–34.0)
MCHC: 32.7 g/dL (ref 30.0–36.0)
MCV: 79.1 fL — ABNORMAL LOW (ref 80.0–100.0)
Platelets: 346 10*3/uL (ref 150–400)
RBC: 5.02 MIL/uL (ref 4.22–5.81)
RDW: 19.8 % — ABNORMAL HIGH (ref 11.5–15.5)
WBC: 10.7 10*3/uL — ABNORMAL HIGH (ref 4.0–10.5)
nRBC: 0 % (ref 0.0–0.2)

## 2022-01-29 LAB — D-DIMER, QUANTITATIVE: D-Dimer, Quant: 0.74 ug/mL-FEU — ABNORMAL HIGH (ref 0.00–0.50)

## 2022-01-29 LAB — CBG MONITORING, ED: Glucose-Capillary: 95 mg/dL (ref 70–99)

## 2022-01-29 LAB — TROPONIN I (HIGH SENSITIVITY): Troponin I (High Sensitivity): 5 ng/L (ref <18)

## 2022-01-29 MED ORDER — ALBUTEROL SULFATE (2.5 MG/3ML) 0.083% IN NEBU
5.0000 mg | INHALATION_SOLUTION | Freq: Once | RESPIRATORY_TRACT | Status: AC
  Administered 2022-01-29: 5 mg via RESPIRATORY_TRACT
  Filled 2022-01-29: qty 6

## 2022-01-29 MED ORDER — SODIUM CHLORIDE (PF) 0.9 % IJ SOLN
INTRAMUSCULAR | Status: AC
  Filled 2022-01-29: qty 50

## 2022-01-29 MED ORDER — IOHEXOL 350 MG/ML SOLN
75.0000 mL | Freq: Once | INTRAVENOUS | Status: AC | PRN
  Administered 2022-01-29: 75 mL via INTRAVENOUS

## 2022-01-29 NOTE — Discharge Instructions (Addendum)
It was our pleasure to provide your ER care today - we hope that you feel better.  See resource guides provided in terms of information about local shelters, and other social service programs in the area.   Your blood count looks stable and chest imaging looks good.   Your CT scan did make incidental note of: Partially visualized complex left renal cyst scribed on recent ultrasound in March. Follow-up ultrasound in 3 months or multiphase CT or MRI is again recommended - discuss above with your doctor at follow up and have them arrange the follow up imaging.   Follow up with primary care doctor in one week. Also have blood pressure rechecked then, as it is high today.   For mental health issues and/or crisis, you may also go directly to the [facility] [place] - it is open 24/7 and walk-ins are welcome.   Return to ER if worse, new symptoms, fevers, chest pain, increased trouble breathing, or other concern.

## 2022-01-29 NOTE — ED Provider Notes (Signed)
[facility] DEPT Provider Note   CSN: [id number] Arrival date & time: 01/29/22  1006     History  Chief Complaint  Patient presents with   Shortness of Breath   Weakness   Loss of Consciousness    [name] is a 56 y.o. male.  Pt c/o general fatigue, tiredness, and sob in past few months. Symptoms gradual onset, moderate, persistent. Pt relatively poor historian - level 5 caveat. Denies chest pain. No cough or uri symptoms. No fever or chills. Pt states hx 'severe anemia'. Denies rectal bleeding or melena. No abd pain or nvd. No wt decrease. No increased leg swelling or pain. No hx dvt or pe. No hx cad. No heat/cold intolerance. Normal appetite.   The history is provided by the patient and medical records.  Shortness of Breath Associated symptoms: syncope   Associated symptoms: no abdominal pain, no chest pain, no cough, no fever, no headaches, no neck pain, no rash, no sore throat and no vomiting   Weakness Associated symptoms: shortness of breath and syncope   Associated symptoms: no abdominal pain, no chest pain, no cough, no dysuria, no fever, no headaches and no vomiting   Loss of Consciousness Associated symptoms: shortness of breath and weakness   Associated symptoms: no chest pain, no confusion, no fever, no headaches, no palpitations and no vomiting       Home Medications Prior to Admission medications   Medication Sig Start Date End Date Taking? Authorizing Provider  albuterol (VENTOLIN HFA) 108 (90 Base) MCG/ACT inhaler Inhale 1-2 puffs into the lungs every 6 (six) hours as needed for wheezing or shortness of breath. 03/14/21   Kinnie Feil, MD  fluticasone furoate-vilanterol (BREO ELLIPTA) 100-25 MCG/INH AEPB Inhale 1 puff into the lungs daily. 03/14/21   Kinnie Feil, MD  nystatin (MYCOSTATIN/NYSTOP) powder Apply 1 application. topically 2 (two) times daily. 12/19/21   Kinnie Feil, MD  nystatin (MYCOSTATIN/NYSTOP)  powder Apply topically 2 times daily. 12/22/21   Kinnie Feil, MD  pantoprazole (PROTONIX) 40 MG tablet Take 1 tablet (40 mg total) by mouth 2 (two) times daily. 10/28/21   Sharyn Creamer, MD      Allergies    Patient has no known allergies.    Review of Systems   Review of Systems  Constitutional:  Negative for fever.  HENT:  Negative for sore throat.   Eyes:  Negative for visual disturbance.  Respiratory:  Positive for shortness of breath. Negative for cough.   Cardiovascular:  Positive for syncope. Negative for chest pain, palpitations and leg swelling.  Gastrointestinal:  Negative for abdominal pain, blood in stool and vomiting.  Genitourinary:  Negative for dysuria and flank pain.  Musculoskeletal:  Negative for back pain and neck pain.  Skin:  Negative for rash.  Neurological:  Positive for weakness. Negative for headaches.  Hematological:  Does not bruise/bleed easily.  Psychiatric/Behavioral:  Negative for confusion.    Physical Exam Updated Vital Signs BP (!) 154/102   Pulse 84   Temp 97.9 F (36.6 C) (Oral)   Resp 17   Ht 1.803 m (5\' 11" )   Wt (!) 145.2 kg   SpO2 92%   BMI 44.63 kg/m  Physical Exam Vitals and nursing note reviewed.  Constitutional:      Appearance: Normal appearance. He is well-developed.  HENT:     Head: Atraumatic.     Nose: Nose normal.     Mouth/Throat:  Mouth: Mucous membranes are moist.  Eyes:     General: No scleral icterus.    Conjunctiva/sclera: Conjunctivae normal.  Neck:     Trachea: No tracheal deviation.  Cardiovascular:     Rate and Rhythm: Normal rate and regular rhythm.     Pulses: Normal pulses.     Heart sounds: Normal heart sounds. No murmur heard.   No friction rub. No gallop.  Pulmonary:     Effort: Pulmonary effort is normal. No accessory muscle usage or respiratory distress.     Breath sounds: Normal breath sounds.  Abdominal:     General: Bowel sounds are normal. There is no distension.     Palpations:  Abdomen is soft.     Tenderness: There is no abdominal tenderness.     Comments: Obese.  Genitourinary:    Comments: No cva tenderness. Musculoskeletal:        General: No swelling or tenderness.     Cervical back: Normal range of motion and neck supple. No rigidity.     Right lower leg: No edema.     Left lower leg: No edema.  Skin:    General: Skin is warm and dry.     Findings: No rash.  Neurological:     Mental Status: He is alert.     Comments: Alert, speech clear. Motor/sens grossly intact bil.   Psychiatric:        Mood and Affect: Mood normal.    ED Results / Procedures / Treatments   Labs (all labs ordered are listed, but only abnormal results are displayed) Results for orders placed or performed during the hospital encounter of [id number]  Basic metabolic panel  Result Value Ref Range   Sodium 138 135 - 145 mmol/L   Potassium 4.1 3.5 - 5.1 mmol/L   Chloride 109 98 - 111 mmol/L   CO2 24 22 - 32 mmol/L   Glucose, Bld 101 (H) 70 - 99 mg/dL   BUN 23 (H) 6 - 20 mg/dL   Creatinine, Ser 1.06 0.61 - 1.24 mg/dL   Calcium 8.7 (L) 8.9 - 10.3 mg/dL   GFR, Estimated >60 >60 mL/min   Anion gap 5 5 - 15  CBC  Result Value Ref Range   WBC 10.7 (H) 4.0 - 10.5 K/uL   RBC 5.02 4.22 - 5.81 MIL/uL   Hemoglobin 13.0 13.0 - 17.0 g/dL   HCT 39.7 39.0 - 52.0 %   MCV 79.1 (L) 80.0 - 100.0 fL   MCH 25.9 (L) 26.0 - 34.0 pg   MCHC 32.7 30.0 - 36.0 g/dL   RDW 19.8 (H) 11.5 - 15.5 %   Platelets 346 150 - 400 K/uL   nRBC 0.0 0.0 - 0.2 %  D-dimer, quantitative  Result Value Ref Range   D-Dimer, Quant 0.74 (H) 0.00 - 0.50 ug/mL-FEU  CBG monitoring, ED  Result Value Ref Range   Glucose-Capillary 95 70 - 99 mg/dL  Troponin I (High Sensitivity)  Result Value Ref Range   Troponin I (High Sensitivity) 5 <18 ng/L   ECHOCARDIOGRAM COMPLETE  Result Date: 01/06/2022    ECHOCARDIOGRAM REPORT   Patient Name:   [name] [facility] Date of Exam: 01/06/2022 Medical Rec #:  [id number]       Height:        71.0 in Accession #:    [id number]      Weight:       330.0 lb Date of Birth:  10-Jul-1966      BSA:  2.609 m Patient Age:    42 years        BP:           138/76 mmHg Patient Gender: M               HR:           87 bpm. Exam Location:  [address] Procedure: 2D Echo, Cardiac Doppler, Color Doppler and Strain Analysis Indications:    R06.09 Dyspnea on exertion  History:        Patient has prior history of Echocardiogram examinations, most                 recent 05/07/2014. COPD, Signs/Symptoms:Dyspnea; Risk                 Factors:Sleep Apnea. Anemia. Morbid obesity.  Sonographer:    Basilia Jumbo BS, RDCS Referring Phys: [place]  1. Left ventricular ejection fraction, by estimation, is 60 to 65%. The left ventricle has normal function. The left ventricle has no regional wall motion abnormalities. There is mild concentric left ventricular hypertrophy. Left ventricular diastolic parameters were normal. The average left ventricular global longitudinal strain is -26.0 %. The global longitudinal strain is normal.  2. Right ventricular systolic function is normal. The right ventricular size is normal. Tricuspid regurgitation signal is inadequate for assessing PA pressure.  3. The mitral valve is normal in structure. No evidence of mitral valve regurgitation. No evidence of mitral stenosis.  4. The aortic valve was not well visualized. Aortic valve regurgitation is not visualized.  5. Aortic dilatation noted. There is moderate dilatation of the ascending aorta, measuring 44 mm.  6. The inferior vena cava is normal in size with greater than 50% respiratory variability, suggesting right atrial pressure of 3 mmHg. Comparison(s): No significant change from prior study. 05/07/14 EF 65-70%. FINDINGS  Left Ventricle: Left ventricular ejection fraction, by estimation, is 60 to 65%. The left ventricle has normal function. The left ventricle has no regional wall motion abnormalities. The  average left ventricular global longitudinal strain is -26.0 %. The global longitudinal strain is normal. The left ventricular internal cavity size was normal in size. There is mild concentric left ventricular hypertrophy. Left ventricular diastolic parameters were normal. Right Ventricle: The right ventricular size is normal. No increase in right ventricular wall thickness. Right ventricular systolic function is normal. Tricuspid regurgitation signal is inadequate for assessing PA pressure. Left Atrium: Left atrial size was normal in size. Right Atrium: Right atrial size was normal in size. Pericardium: There is no evidence of pericardial effusion. Mitral Valve: The mitral valve is normal in structure. No evidence of mitral valve regurgitation. No evidence of mitral valve stenosis. Tricuspid Valve: The tricuspid valve is not well visualized. Tricuspid valve regurgitation is not demonstrated. No evidence of tricuspid stenosis. Aortic Valve: The aortic valve was not well visualized. Aortic valve regurgitation is not visualized. Pulmonic Valve: The pulmonic valve was normal in structure. Pulmonic valve regurgitation is not visualized. No evidence of pulmonic stenosis. Aorta: Aortic dilatation noted. There is moderate dilatation of the ascending aorta, measuring 44 mm. Venous: The inferior vena cava is normal in size with greater than 50% respiratory variability, suggesting right atrial pressure of 3 mmHg. IAS/Shunts: No atrial level shunt detected by color flow Doppler.  LEFT VENTRICLE PLAX 2D LVIDd:         4.30 cm   Diastology LVIDs:         2.90 cm  LV e' medial:    11.20 cm/s LV PW:         1.20 cm   LV E/e' medial:  7.4 LV IVS:        1.30 cm   LV e' lateral:   10.60 cm/s LVOT diam:     2.70 cm   LV E/e' lateral: 7.9 LV SV:         133 LV SV Index:   51        2D Longitudinal Strain LVOT Area:     5.73 cm  2D Strain GLS (A2C):   -27.9 %                          2D Strain GLS (A3C):   -28.8 %                           2D Strain GLS (A4C):   -21.2 %                          2D Strain GLS Avg:     -26.0 % RIGHT VENTRICLE             IVC RV Basal diam:  3.60 cm     IVC diam: 2.00 cm RV S prime:     20.60 cm/s TAPSE (M-mode): 2.4 cm LEFT ATRIUM             Index        RIGHT ATRIUM           Index LA diam:        3.80 cm 1.46 cm/m   RA Pressure: 8.00 mmHg LA Vol (A2C):   45.7 ml 17.52 ml/m  RA Area:     20.90 cm LA Vol (A4C):   61.3 ml 23.50 ml/m  RA Volume:   61.50 ml  23.58 ml/m LA Biplane Vol: 56.4 ml 21.62 ml/m  AORTIC VALVE LVOT Vmax:   134.00 cm/s LVOT Vmean:  92.700 cm/s LVOT VTI:    0.233 m  AORTA Ao Root diam: 3.80 cm Ao Asc diam:  4.50 cm MITRAL VALVE               TRICUSPID VALVE                            Estimated RAP:  8.00 mmHg MV Decel Time: 201 msec MV E velocity: 83.40 cm/s  SHUNTS MV A velocity: 79.40 cm/s  Systemic VTI:  0.23 m MV E/A ratio:  1.05        Systemic Diam: 2.70 cm Riley Lam MD Electronically signed by Riley Lam MD Signature Date/Time: 01/06/2022/5:19:59 PM    Final       EKG EKG Interpretation  Date/Time:  Thursday Jan 29 2022 10:15:04 EDT Ventricular Rate:  104 PR Interval:  131 QRS Duration: 91 QT Interval:  333 QTC Calculation: 438 R Axis:   133 Text Interpretation: Sinus tachycardia Right axis deviation Confirmed by Cathren Laine ([id number]) on 01/29/2022 11:24:06 AM  Radiology CT Angio Chest PE W/Cm &/Or Wo Cm  Result Date: 01/29/2022 CLINICAL DATA:  Pulmonary embolism (PE) suspected, positive D-dimer ddimer mildly high r/o PE EXAM: CT ANGIOGRAPHY CHEST WITH CONTRAST TECHNIQUE: Multidetector CT imaging of the chest was performed using the standard protocol during bolus administration of intravenous contrast. Multiplanar CT image  reconstructions and MIPs were obtained to evaluate the vascular anatomy. RADIATION DOSE REDUCTION: This exam was performed according to the departmental dose-optimization program which includes automated exposure control,  adjustment of the mA and/or kV according to patient size and/or use of iterative reconstruction technique. CONTRAST:  66mL OMNIPAQUE IOHEXOL 350 MG/ML SOLN COMPARISON:  Chest CT 10/01/2021, CT 03/01/2012 FINDINGS: Cardiovascular: Satisfactory opacification of the pulmonary arteries to the segmental level. No evidence of pulmonary embolism. Normal cardiac size.No pericardial disease. The thoracic aorta is unremarkable. Mediastinum/Nodes: No lymphadenopathy. The thyroid is unremarkable. Moderate-sized hiatal hernia. Lungs/Pleura: There is no focal airspace consolidation. There is a 5 mm pulmonary nodule in the right middle lobe which is not significantly changed since June 2013 and likely benign. There are no new pulmonary nodules. Upper Abdomen: No acute abnormality. Partially visualized complex left renal cyst described on recent ultrasound. Unchanged simple appearing right renal cyst. Musculoskeletal: There are multiple subacute to chronic rib fractures bilaterally, unchanged. There is also a subacute to chronic right upper sternal fracture, unchanged. No new osseous abnormality. No suspicious lytic or blastic lesions. Mild multilevel degenerative changes of the spine. Review of the MIP images confirms the above findings. IMPRESSION: No pulmonary embolism or other acute findings in the chest. Partially visualized complex left renal cyst scribed on recent ultrasound in March. Follow-up ultrasound in 3 months or multiphase CT or MRI is again recommended for further characterization. Electronically Signed   By: Maurine Simmering M.D.   On: 01/29/2022 14:32   DG Chest Port 1 View  Result Date: 01/29/2022 CLINICAL DATA:  Shortness of breath, weakness and syncope for 2 days EXAM: PORTABLE CHEST 1 VIEW COMPARISON:  Portable exam 1147 hours compared to 08/22/2021 and correlated to CT chest of 10/01/2021 FINDINGS: Borderline enlargement of cardiac silhouette. Mediastinal contours and pulmonary vascularity normal. Mild  peribronchial thickening in perihilar regions. Lungs clear. No infiltrate, pleural effusion, or pneumothorax. Faint nodular opacities project over the mid RIGHT lung, appear elated to the anterior aspects of the RIGHT fourth fifth and sixth ribs, question healing fractures; healing fractures are present at the anterior RIGHT second through sixth ribs on a prior CT. IMPRESSION: Bronchitic changes without infiltrate. Healing fractures of multiple anterior RIGHT ribs. Electronically Signed   By: Lavonia Dana M.D.   On: 01/29/2022 11:58    Procedures Procedures    Medications Ordered in ED Medications - No data to display  ED Course/ Medical Decision Making/ A&P                           Medical Decision Making Problems Addressed: Chronic dyspnea: chronic illness or injury with exacerbation, progression, or side effects of treatment that poses a threat to life or bodily functions Elevated blood pressure reading: acute illness or injury Shortness of breath: acute illness or injury with systemic symptoms that poses a threat to life or bodily functions  Amount and/or Complexity of Data Reviewed External Data Reviewed: radiology and notes. Labs: ordered. Decision-making details documented in ED Course. Radiology: ordered and independent interpretation performed. Decision-making details documented in ED Course. ECG/medicine tests: ordered and independent interpretation performed. Decision-making details documented in ED Course.  Risk Prescription drug management. Decision regarding hospitalization.   Iv ns. Continuous pulse ox and cardiac monitoring. Labs ordered/sent. Imaging ordered.   Differential dx includes pe, pna, ptx, etc. Disposition decision including possible need for admission considered, if pt w pna, pe or acute process -  will get labs and  imaging and revisit dispo decision.   Reviewed nursing notes and prior charts for additional history. External reports reviewed.  Cardiac  monitor: sinus rhythm, rate 84.  Pt requests breathing tx - alb tx given.   Labs reviewed/interpreted by me - hgb c/w prior. Trop normal - after prolonged symptoms/days, felt not c/w acs. Ddimer mildly elev. Will get cta.   Xrays reviewed/interpreted by me - no pna.  CTA chest  - no PE. Incidental finding/follow up need shared w pt.   Recheck, breathing comfortably, sats 98%. No cp.   Pt appears stable for d/c.  Rec pcp f/u.  Return precautions provided.            Final Clinical Impression(s) / ED Diagnoses Final diagnoses:  None    Rx / DC Orders ED Discharge Orders     None         Lajean Saver, MD 01/29/22 [phone]

## 2022-01-29 NOTE — ED Triage Notes (Signed)
Patient c/o SOB, weakness, and syncope that occurred approx 2 days ago.  Patient reports that he thinks that he passed out 2 days ago and states he is homeless and living in his car. Patient states 2 nights ago he woke up with money in his pocket and had gas in his car and does not remember how this occurred. Patient also reports a history of anemia.

## 2022-02-10 ENCOUNTER — Other Ambulatory Visit (HOSPITAL_COMMUNITY): Payer: Self-pay

## 2022-02-10 ENCOUNTER — Ambulatory Visit (INDEPENDENT_AMBULATORY_CARE_PROVIDER_SITE_OTHER): Payer: Self-pay | Admitting: Family Medicine

## 2022-02-10 ENCOUNTER — Encounter: Payer: Self-pay | Admitting: *Deleted

## 2022-02-10 ENCOUNTER — Encounter: Payer: Self-pay | Admitting: Physician Assistant

## 2022-02-10 ENCOUNTER — Encounter: Payer: Self-pay | Admitting: Family Medicine

## 2022-02-10 VITALS — BP 134/93 | HR 98 | Ht 71.0 in | Wt 323.8 lb

## 2022-02-10 DIAGNOSIS — J449 Chronic obstructive pulmonary disease, unspecified: Secondary | ICD-10-CM

## 2022-02-10 DIAGNOSIS — G4733 Obstructive sleep apnea (adult) (pediatric): Secondary | ICD-10-CM

## 2022-02-10 DIAGNOSIS — F329 Major depressive disorder, single episode, unspecified: Secondary | ICD-10-CM

## 2022-02-10 DIAGNOSIS — R030 Elevated blood-pressure reading, without diagnosis of hypertension: Secondary | ICD-10-CM

## 2022-02-10 DIAGNOSIS — R413 Other amnesia: Secondary | ICD-10-CM

## 2022-02-10 DIAGNOSIS — N281 Cyst of kidney, acquired: Secondary | ICD-10-CM

## 2022-02-10 MED ORDER — FLUTICASONE FUROATE-VILANTEROL 100-25 MCG/ACT IN AEPB: 1.0000 | INHALATION_SPRAY | Freq: Every day | RESPIRATORY_TRACT | 0 refills | Status: DC

## 2022-02-10 MED ORDER — NYSTATIN 100000 UNIT/GM EX POWD
1.0000 "application " | Freq: Two times a day (BID) | CUTANEOUS | 0 refills | Status: DC
  Filled 2022-02-10: qty 15, 8d supply, fill #0

## 2022-02-10 MED ORDER — ALBUTEROL SULFATE HFA 108 (90 BASE) MCG/ACT IN AERS
1.0000 | INHALATION_SPRAY | Freq: Four times a day (QID) | RESPIRATORY_TRACT | 3 refills | Status: DC | PRN
  Filled 2022-02-10: qty 18, 30d supply, fill #0

## 2022-02-10 NOTE — Assessment & Plan Note (Signed)
He is unable to wear his sleep machine due to his homelessness status. Likely contributing to his fatigue, SOB and potentially LOC. I discussed referral to CCM to help with housing and food insecurity. He agreed with referral.

## 2022-02-10 NOTE — Assessment & Plan Note (Signed)
Repeated BP remains elevated. Monitor for now. F/U appointment made for 1-2 weeks to reassess.

## 2022-02-10 NOTE — Progress Notes (Signed)
SUBJECTIVE:   CHIEF COMPLAINT / HPI:   HFU/OSA/COPD: He was recently evaluated at the ED for SOB. CTA shows negative PE. However, he continues to have SOB, especially with exertion. He feels he lacks energy. No chest pain, wheezing, or chest tightness. He has been out of his albuterol and breo for about a week or more.   Amnesia/LOC: He endorses an instance where he would lose consciousness, and when he wakes up, he can't remember the preceding events. This has occurred multiple times in the past few years. No other neurologic concerns.   Elevated blood pressure: BP elevated during this visit.  Obesity: He is unable to exercise due to his respiratory symptoms. He is yet to connect with the nutritionist.  Renal cyst: No renal symptoms. Need f/u per recent imaging.  Mood Issue: No SI or HI. During the visit, he did not bring this issue up, but his PHQ9 was elevated.  PERTINENT  PMH / PSH: PMHx reviewed  OBJECTIVE:   Vitals:   02/10/22 0902  BP: (!) 134/93  Pulse: 98  SpO2: 97%  Weight: (!) 323 lb 12.8 oz (146.9 kg)  Height: 5\' 11"  (1.803 m)    Physical Exam Vitals and nursing note reviewed.  Cardiovascular:     Rate and Rhythm: Normal rate and regular rhythm.     Heart sounds: Normal heart sounds. No murmur heard. Pulmonary:     Effort: Pulmonary effort is normal. No respiratory distress.     Breath sounds: No wheezing.  Abdominal:     General: Abdomen is flat. There is no distension.     Palpations: There is no mass.  Musculoskeletal:     Right lower leg: No edema.     Left lower leg: No edema.  Neurological:     General: No focal deficit present.     Mental Status: He is oriented to person, place, and time.     Cranial Nerves: Cranial nerves 2-12 are intact.     Sensory: Sensation is intact.     Motor: Motor function is intact.     Coordination: Coordination is intact.     Deep Tendon Reflexes: Reflexes are normal and symmetric.     ASSESSMENT/PLAN:   I reviewed his ED report including notes and imaging.  OSA (obstructive sleep apnea) He is unable to wear his sleep machine due to his homelessness status. Likely contributing to his fatigue, SOB and potentially LOC. I discussed referral to CCM to help with housing and food insecurity. He agreed with referral.  COPD (chronic obstructive pulmonary disease) (HCC) Sleep study from 2016 reviewed which shows mild COPD. Off inhalers for more than a week, likely contributing to his SOB. Not in COPD exacerbation. Recent ECHO reviewed, without CHF concerns. I gave him 2 Breo samples and refilled his albuterol. F/U as needed.  Amnesia Recurrent with LOC. Etiology unclear. ?? Seizures vs related to hypercarbia in patients with OSA. Referral to CCM to help with housing, so he can use his sleep machine. He agreed with neurology referral for EEG. Neuroimaging at some point or per neuro. Referral placed.   Elevated BP without diagnosis of hypertension Repeated BP remains elevated. Monitor for now. F/U appointment made for 1-2 weeks to reassess.  Morbid obesity (HCC) Contact nutritionist as recommended during his last visit. I again discussed semaglutide.  However, due to insurance issue, we are unable to initiate this. I will message the pharmacy team, if we can get him this medication via [facility].  Renal cyst Left renal cyst. Found on [place] 3 months ago. Same on CT scan in May. Recommendation is to repeat US in 3 months. Renal US ordered.   Depression Likely due to his living condition. He did not bring this up during this visit, but his PHQ9 was elevated. I advised him to f/u in 1-2 weeks to address further. Otherwise, he is non-suicidal or homicidal.  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

## 2022-02-10 NOTE — Assessment & Plan Note (Signed)
Left renal cyst. Found on [place] 3 months ago. Same on CT scan in May. Recommendation is to repeat US in 3 months. Renal US ordered.

## 2022-02-10 NOTE — Assessment & Plan Note (Signed)
Recurrent with LOC. Etiology unclear. ?? Seizures vs related to hypercarbia in patients with OSA. Referral to CCM to help with housing, so he can use his sleep machine. He agreed with neurology referral for EEG. Neuroimaging at some point or per neuro. Referral placed.

## 2022-02-10 NOTE — Patient Instructions (Signed)
Semaglutide Injection (Weight Management) ?What is this medication? ?SEMAGLUTIDE (SEM a GLOO tide) promotes weight loss. It may also be used to maintain weight loss. It works by decreasing appetite. Changes to diet and exercise are often combined with this medication. ?This medicine may be used for other purposes; ask your health care provider or pharmacist if you have questions. ?COMMON BRAND NAME(S): Wegovy ?What should I tell my care team before I take this medication? ?They need to know if you have any of these conditions: ?Endocrine tumors (MEN 2) or if someone in your family had these tumors ?Eye disease, vision problems ?Gallbladder disease ?History of depression or mental health disease ?History of pancreatitis ?Kidney disease ?Stomach or intestine problems ?Suicidal thoughts, plans, or attempt; a previous suicide attempt by you or a family member ?Thyroid cancer or if someone in your family had thyroid cancer ?An unusual or allergic reaction to semaglutide, other medications, foods, dyes, or preservatives ?Pregnant or trying to get pregnant ?Breast-feeding ?How should I use this medication? ?This medication is injected under the skin. You will be taught how to prepare and give it. Take it as directed on the prescription label. It is given once every week (every 7 days). Keep taking it unless your care team tells you to stop. ?It is important that you put your used needles and pens in a special sharps container. Do not put them in a trash can. If you do not have a sharps container, call your pharmacist or care team to get one. ?A special MedGuide will be given to you by the pharmacist with each prescription and refill. Be sure to read this information carefully each time. ?This medication comes with INSTRUCTIONS FOR USE. Ask your pharmacist for directions on how to use this medication. Read the information carefully. Talk to your pharmacist or care team if you have questions. ?Talk to your care team about  the use of this medication in children. While it may be prescribed for children as young as 12 years for selected conditions, precautions do apply. ?Overdosage: If you think you have taken too much of this medicine contact a poison control center or emergency room at once. ?NOTE: This medicine is only for you. Do not share this medicine with others. ?What if I miss a dose? ?If you miss a dose and the next scheduled dose is more than 2 days away, take the missed dose as soon as possible. If you miss a dose and the next scheduled dose is less than 2 days away, do not take the missed dose. Take the next dose at your regular time. Do not take double or extra doses. If you miss your dose for 2 weeks or more, take the next dose at your regular time or call your care team to talk about how to restart this medication. ?What may interact with this medication? ?Insulin and other medications for diabetes ?This list may not describe all possible interactions. Give your health care provider a list of all the medicines, herbs, non-prescription drugs, or dietary supplements you use. Also tell them if you smoke, drink alcohol, or use illegal drugs. Some items may interact with your medicine. ?What should I watch for while using this medication? ?Visit your care team for regular checks on your progress. It may be some time before you see the benefit from this medication. ?Drink plenty of fluids while taking this medication. Check with your care team if you have severe diarrhea, nausea, and vomiting, or if you sweat a   lot. The loss of too much body fluid may make it dangerous for you to take this medication. ?This medication may affect blood sugar levels. Ask your care team if changes in diet or medications are needed if you have diabetes. ?If you or your family notice any changes in your behavior, such as new or worsening depression, thoughts of harming yourself, anxiety, other unusual or disturbing thoughts, or memory loss, call  your care team right away. ?Women should inform their care team if they wish to become pregnant or think they might be pregnant. Losing weight while pregnant is not advised and may cause harm to the unborn child. Talk to your care team for more information. ?What side effects may I notice from receiving this medication? ?Side effects that you should report to your care team as soon as possible: ?Allergic reactions--skin rash, itching, hives, swelling of the face, lips, tongue, or throat ?Change in vision ?Dehydration--increased thirst, dry mouth, feeling faint or lightheaded, headache, dark yellow or brown urine ?Gallbladder problems--severe stomach pain, nausea, vomiting, fever ?Heart palpitations--rapid, pounding, or irregular heartbeat ?Kidney injury--decrease in the amount of urine, swelling of the ankles, hands, or feet ?Pancreatitis--severe stomach pain that spreads to your back or gets worse after eating or when touched, fever, nausea, vomiting ?Thoughts of suicide or self-harm, worsening mood, feelings of depression ?Thyroid cancer--new mass or lump in the neck, pain or trouble swallowing, trouble breathing, hoarseness ?Side effects that usually do not require medical attention (report to your care team if they continue or are bothersome): ?Diarrhea ?Loss of appetite ?Nausea ?Stomach pain ?Vomiting ?This list may not describe all possible side effects. Call your doctor for medical advice about side effects. You may report side effects to FDA at 1-800-FDA-1088. ?Where should I keep my medication? ?Keep out of the reach of children and pets. ?Refrigeration (preferred): Store in the refrigerator. Do not freeze. Keep this medication in the original container until you are ready to take it. Get rid of any unused medication after the expiration date. ?Room temperature: If needed, prior to cap removal, the pen can be stored at room temperature for up to 28 days. Protect from light. If it is stored at room  temperature, get rid of any unused medication after 28 days or after it expires, whichever is first. ?It is important to get rid of the medication as soon as you no longer need it or it is expired. You can do this in two ways: ?Take the medication to a medication take-back program. Check with your pharmacy or law enforcement to find a location. ?If you cannot return the medication, follow the directions in the MedGuide. ?NOTE: This sheet is a summary. It may not cover all possible information. If you have questions about this medicine, talk to your doctor, pharmacist, or health care provider. ?? 2023 Elsevier/Gold Standard (2021-09-10 00:00:00) ? ?

## 2022-02-10 NOTE — Assessment & Plan Note (Signed)
Likely due to his living condition. He did not bring this up during this visit, but his PHQ9 was elevated. I advised him to f/u in 1-2 weeks to address further. Otherwise, he is non-suicidal or homicidal.  Flowsheet Row Office Visit from 02/10/2022 in [facility]  PHQ-9 Total Score 21

## 2022-02-10 NOTE — Assessment & Plan Note (Signed)
Contact nutritionist as recommended during his last visit. I again discussed semaglutide.  However, due to insurance issue, we are unable to initiate this. I will message the pharmacy team, if we can get him this medication via MAP.

## 2022-02-10 NOTE — Assessment & Plan Note (Signed)
Sleep study from 2016 reviewed which shows mild COPD. Off inhalers for more than a week, likely contributing to his SOB. Not in COPD exacerbation. Recent ECHO reviewed, without CHF concerns. I gave him 2 Breo samples and refilled his albuterol. F/U as needed.

## 2022-02-11 ENCOUNTER — Telehealth: Payer: Self-pay | Admitting: *Deleted

## 2022-02-11 NOTE — Chronic Care Management (AMB) (Signed)
  Care Management   Note  02/11/2022 Name: [name] MRN: [id number] DOB: Apr 22, 1966  Okey Regal Rucci is a 56 y.o. year old male who is a primary care patient of Kinnie Feil, MD. I reached out to Wynonia Lawman by phone today offer care coordination services.   Mr. Sibayan was given information about care management services today including:  Care management services include personalized support from designated clinical staff supervised by his physician, including individualized plan of care and coordination with other care providers 24/7 contact phone numbers for assistance for urgent and routine care needs. The patient may stop care management services at any time by phone call to the office staff.  Patient agreed to services and verbal consent obtained.   Follow up plan: Telephone appointment with care management team member scheduled for:02/13/22  [place] [facility]  Direct Dial: [phone]

## 2022-02-12 ENCOUNTER — Encounter: Payer: Self-pay | Admitting: Family Medicine

## 2022-02-13 ENCOUNTER — Ambulatory Visit: Payer: Self-pay

## 2022-02-13 ENCOUNTER — Telehealth: Payer: Self-pay

## 2022-02-13 DIAGNOSIS — G4733 Obstructive sleep apnea (adult) (pediatric): Secondary | ICD-10-CM

## 2022-02-13 DIAGNOSIS — F329 Major depressive disorder, single episode, unspecified: Secondary | ICD-10-CM

## 2022-02-13 DIAGNOSIS — J449 Chronic obstructive pulmonary disease, unspecified: Secondary | ICD-10-CM

## 2022-02-13 NOTE — Chronic Care Management (AMB) (Signed)
Social Work Note  02/13/2022 Name: SIAH STEELY MRN: [id number] DOB: 03/13/1966  Belia Heman Grandstaff is a 56 y.o. year old male who is a primary care patient of [name], MD.  The Care Management team was consulted for assistance with chronic disease management and care coordination needs.  Mr. Zappone was given information about Care Management services today including:  Care Management services include personalized support from designated clinical staff supervised by his physician, including individualized plan of care and coordination with other care providers 24/7 contact phone numbers for assistance for urgent and routine care needs. The patient may stop care management services at any time (effective at the end of the month) by phone call to the office staff.  Patient agreed to services and consent obtained.   Engaged with patient by telephone for initial visit in response to provider referral for social work chronic care management and care coordination services.  Telephonic visit completed with the patient to assist with care coordination needs. Discussed the patient is currently living in his car and has done so for approximately two years. The patient reports he does not have any income but is actively working with the [facility] to obtain disability. The patient has submitted his application for disability and completed the interview with a physician and psychiatrist. SW encouraged the patient to remain engaged with the [facility] to complete this application process.  Reviewed referral reason with the patient related to patients inability to wear his sleep apnea machine at night due to living in his car. Assessed for patients interest to move into a shelter in order to begin wearing his machine. Patient declined stating he does not feel safe at a shelter. Patient reports he parks his car in various church parking lots to sleep. Determined the patient is receiving benefits  from [employer] to assist with food costs and also asks for money in order to pay for gas, food, and his phone minutes.   Determined the patient would like gift cards to assist with gas pricing. Collaboration with colleague Christen Butter, SW to determine options for gast cards may be offered via [employer] representative or [employer]. SW contacted the patient to advise of resources he may access in an attempt to obtain a gas gift card.  Assessment: Review of patient past medical history, allergies, medications, and health status, including review of pertinent consultant reports was performed as part of comprehensive evaluation and provision of care management/care coordination services.   SDOH (Social Determinants of Health) assessments and interventions performed:  Yes SDOH Interventions    Flowsheet Row Most Recent Value  SDOH Interventions   Food Insecurity Interventions Other (Comment)  [pt already connected with [employer],  is not interested in other resources]  Housing Interventions Patient Refused  [pt is not interested in a shelter]        Advanced Directives Status: Not addressed in this encounter.  Care Plan  No Known Allergies  Outpatient Encounter Medications as of 02/13/2022  Medication Sig   albuterol (VENTOLIN HFA) 108 (90 Base) MCG/ACT inhaler Inhale 1-2 puffs into the lungs every 6 (six) hours as needed for wheezing or shortness of breath.   fluticasone furoate-vilanterol (BREO ELLIPTA) 100-25 MCG/ACT AEPB Inhale 1 puff into the lungs daily.   nystatin (MYCOSTATIN/NYSTOP) powder Apply to affectecd area(s) to skin 2 times daily.   pantoprazole (PROTONIX) 40 MG tablet Take 1 tablet (40 mg total) by mouth 2 (two) times daily.   No facility-administered encounter  medications on file as of 02/13/2022.    Patient Active Problem List   Diagnosis Date Noted   Elevated BP without diagnosis of hypertension 02/10/2022   Asthma 12/19/2021   COPD (chronic obstructive pulmonary disease)  (HCC) 12/19/2021   Intertriginous candidiasis 12/19/2021   Renal cyst 12/17/2021   Homelessness 03/14/2021   Morbid obesity (HCC) 03/14/2021   Amnesia 02/09/2020   Hypogonadism in male 09/14/2017   OSA (obstructive sleep apnea) 05/29/2014   Iron deficiency anemia 12/22/2013   Chronic fatigue 07/07/2013   Adult ADHD 07/07/2013   Dyspnea 03/01/2012   Depression 01/07/2012   GERD (gastroesophageal reflux disease) 01/07/2012    Conditions to be addressed/monitored: COPD, Depression, and OSA ; Financial constraints related to costs of living and Housing barriers  There are no care plans that you recently modified to display for this patient.    Follow Up Plan:  No SW follow up planned at this time. The patient will remain engaged with the [facility] to address Disability Application process.      Bevelyn Ngo, BSW, CDP Social Worker, Certified Dementia Practitioner [facility] Care Management [phone]

## 2022-02-13 NOTE — Patient Instructions (Signed)
Visit Information  Thank you for taking time to visit with me today. Please don't hesitate to contact me if I can be of assistance to you before our next scheduled telephone appointment.  Following are the goals we discussed today:  -Remain engaged with the [facility] to complete Disability application process -Visit the [employer] or [employer] to obtain a gas gift card if they are available   If you are experiencing a [place] or [place] or need someone to talk to, please go to [facility] [address] [place] [phone])   Following is a copy of your full plan of care:  There are no care plans that you recently modified to display for this patient.   [name] was given information about Care Management services by the embedded care coordination team including:  Care Management services include personalized support from designated clinical staff supervised by his physician, including individualized plan of care and coordination with other care providers 24/7 contact phone numbers for assistance for urgent and routine care needs. The patient may stop CCM services at any time (effective at the end of the month) by phone call to the office staff.  Patient agreed to services and verbal consent obtained.   Patient verbalizes understanding of instructions and care plan provided today and agrees to view in [place]. Active MyChart status and patient understanding of how to access instructions and care plan via MyChart confirmed with patient.     No follow up planned at this time. Please contact your primary care provider as needed.  Daneen Schick, BSW, CDP Social Worker, Certified Dementia Practitioner [place] [facility] [phone]

## 2022-02-15 NOTE — Progress Notes (Signed)
@Patient  ID: [name], male    DOB: [birth year], 56 y.o.   MRN: [id number]  Chief Complaint  Patient presents with   Consult    Consult for SOB. Pt states that with any exertion he gets SOB. Pt states he does gasp for air a lot of times. He saw Dr Annamaria Boots in the past.     Referring provider: Cordelia Poche  HPI:   56 y.o. man whom we are seeing in evaluation for dyspnea on exertion.  Note from referring provider reviewed.  Patient notes chronic dyspnea on exertion.  Bit worse over the last few months.  Associated weight gain of about 20 pounds.  Relatively rapid over the last couple of months.  Denies significant change in diet.  No change in exercise habits etc.  Dyspnea worse on inclines or stairs.  No time of day when better or worse.  No position make things better or worse.  No seasonal or environmental factors he can identify to make things better or worse.  No alleviating or exacerbating factors.  Reviewed chest x-ray 08/2021 that reveals clear lungs bilaterally on my review interpretation.  Reviewed CT chest 10/01/2021 that reveals clear lungs bilaterally on my review and interpretation.  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, chartered / Pulmonary Flowsheets:   ACT:      No data to display          MMRC:     No data to display          Epworth:      No data to display          Tests:   FENO:  No results found for: "NITRICOXIDE"  PFT:     No data to display          WALK:      No data to display          Imaging: Personally reviewed and as per EMR and discussion this note  Lab Results: Personally reviewed CBC    Component Value Date/Time   WBC 10.7 (H) 01/29/2022 1012   RBC 5.02 01/29/2022 1012   HGB 13.0 01/29/2022 1012   HGB 12.4 (L) 12/16/2021 1253   HGB 10.7 (L)  03/14/2021 1011   HCT 39.7 01/29/2022 1012   HCT 35.9 (L) 03/14/2021 1011   PLT 346 01/29/2022 1012   PLT 356 12/16/2021 1253   PLT 450 03/14/2021 1011   MCV 79.1 (L) 01/29/2022 1012   MCV 74 (L) 03/14/2021 1011   MCH 25.9 (L) 01/29/2022 1012   MCHC 32.7 01/29/2022 1012   RDW 19.8 (H) 01/29/2022 1012   RDW 15.1 03/14/2021 1011   LYMPHSABS 0.9 12/16/2021 1253   LYMPHSABS 1.0 03/14/2021 1011   MONOABS 0.8 12/16/2021 1253   EOSABS 0.6 (H) 12/16/2021 1253   EOSABS 0.4 03/14/2021 1011   BASOSABS 0.1 12/16/2021 1253   BASOSABS 0.0 03/14/2021 1011    BMET    Component Value Date/Time   NA 138 01/29/2022 1012   NA 139 03/14/2021 1011   K 4.1 01/29/2022 1012   CL 109 01/29/2022 1012   CO2 24 01/29/2022 1012   GLUCOSE 101 (H) 01/29/2022 1012   BUN 23 (H) 01/29/2022 1012   BUN 18 03/14/2021 1011   CREATININE 1.06 01/29/2022 1012   CREATININE 1.06 10/27/2021 1006   CREATININE 1.26 05/29/2014 0847  CALCIUM 8.7 (L) 01/29/2022 1012   GFRNONAA >60 01/29/2022 1012   GFRNONAA >60 10/27/2021 1006   GFRNONAA 67 05/29/2014 0847   GFRAA 94 09/14/2017 1157   GFRAA 77 05/29/2014 0847    BNP No results found for: "BNP"  ProBNP No results found for: "PROBNP"  Specialty Problems       Pulmonary Problems   Dyspnea   OSA (obstructive sleep apnea)    NPSG 04/30/14- positive for moderate obstructive sleep apnea, AHI 16.9 per hour with moderate snoring and desaturation to 86%. CPAP titrated to 14 CWP  01/02/2018-sleep study- severe obstructive sleep apnea with an AHI of 57.1  MSLT>>>       Asthma   COPD (chronic obstructive pulmonary disease) (HCC)    No Known Allergies  Immunization History  Administered Date(s) Administered   PFIZER(Purple Top)SARS-COV-2 Vaccination 04/19/2020   Tdap 11/08/2013    Past Medical History:  Diagnosis Date   ADD (attention deficit disorder)    ADD (attention deficit disorder)    Allergy    Anemia, iron deficiency    Anxiety    Asthma     Blood transfusion    Constipation    Depression    Depression    Exertional dyspnea    secondary to anemia   GERD (gastroesophageal reflux disease)    Hiatal hernia    Narcolepsy    pt also reports episodes of passing out that are unrelated to narcolepsy about a year ago   Sleep apnea 01/2018   has a CPAP, "unable to use it"    Tobacco History: Social History   Tobacco Use  Smoking Status Never  Smokeless Tobacco Never   Counseling given: Not Answered   Continue to not smoke  Outpatient Encounter Medications as of 12/23/2021  Medication Sig   pantoprazole (PROTONIX) 40 MG tablet Take 1 tablet (40 mg total) by mouth 2 (two) times daily.   [DISCONTINUED] albuterol (VENTOLIN HFA) 108 (90 Base) MCG/ACT inhaler Inhale 1-2 puffs into the lungs every 6 (six) hours as needed for wheezing or shortness of breath. (Patient not taking: Reported on 02/10/2022)   [DISCONTINUED] fluticasone furoate-vilanterol (BREO ELLIPTA) 100-25 MCG/INH AEPB Inhale 1 puff into the lungs daily. (Patient not taking: Reported on 02/10/2022)   [DISCONTINUED] nystatin (MYCOSTATIN/NYSTOP) powder Apply 1 application. topically 2 (two) times daily.   [DISCONTINUED] nystatin (MYCOSTATIN/NYSTOP) powder Apply topically 2 times daily.   No facility-administered encounter medications on file as of 12/23/2021.     Review of Systems  Review of Systems  No chest pain with exertion.  No orthopnea or PND.  Comprehensive review of systems otherwise negative Physical Exam  BP 138/76 (BP Location: Left Arm, Patient Position: Sitting, Cuff Size: Normal)   Pulse (!) 109   Temp 98 F (36.7 C) (Oral)   Ht 5\' 11"  (1.803 m)   Wt (!) 330 lb (149.7 kg)   SpO2 97%   BMI 46.03 kg/m   Wt Readings from Last 5 Encounters:  02/10/22 (!) 323 lb 12.8 oz (146.9 kg)  01/29/22 (!) 320 lb (145.2 kg)  01/13/22 (!) 322 lb 12.8 oz (146.4 kg)  12/23/21 (!) 330 lb (149.7 kg)  12/19/21 (!) 331 lb 12.8 oz (150.5 kg)    BMI Readings from  Last 5 Encounters:  02/10/22 45.16 kg/m  01/29/22 44.63 kg/m  01/13/22 45.02 kg/m  12/23/21 46.03 kg/m  12/19/21 46.28 kg/m     Physical Exam General: Sitting in chair, no acute distress Eyes: EOMI, no icterus Neck: Supple,  no JVP Pulmonary: Clear, no work of breathing Cardiovascular: Regular rhythm, no murmur Abdomen: Slight distention, soft, bowel sounds present MSK: No synovitis, no joint effusion Neuro: Normal gait, no weakness Psych: Normal mood, full affect  Assessment & Plan:   Dyspnea on exertion: Concern for deconditioning.  Also concern for contribution of weight given report of increased weight.  Relatively rapid weight gain over the last couple of months.  Do wonder this is related to fluid accumulation.  Heart failure versus cirrhosis.  Some abdominal tightness, distention.  TTE for further evaluation.  PFTs ordered for further evaluation.  Low suspicion for lung pathology given recent CT scan with clear, normal lungs.   Return in about 2 months (around 02/22/2022).   Lanier Clam, MD 02/15/2022

## 2022-02-16 ENCOUNTER — Ambulatory Visit (HOSPITAL_COMMUNITY): Admission: RE | Admit: 2022-02-16 | Payer: Self-pay | Source: Ambulatory Visit

## 2022-02-17 ENCOUNTER — Encounter: Payer: Self-pay | Admitting: Family Medicine

## 2022-02-18 ENCOUNTER — Other Ambulatory Visit (HOSPITAL_COMMUNITY): Payer: Self-pay

## 2022-02-19 ENCOUNTER — Encounter: Payer: Self-pay | Admitting: Pulmonary Disease

## 2022-02-19 ENCOUNTER — Telehealth: Payer: Self-pay

## 2022-02-19 NOTE — Telephone Encounter (Signed)
Spoke with patient informed that appt was made for US renal at [facility] June 22nd at 2:30 arrive at 2:00 with full bladder. Patient understood no questions . [name], CMA

## 2022-02-20 NOTE — Telephone Encounter (Signed)
Dr. Judeth Horn, please see recent mychart message sent by pt and advise.

## 2022-02-23 ENCOUNTER — Encounter: Payer: Self-pay | Admitting: Pulmonary Disease

## 2022-02-23 ENCOUNTER — Ambulatory Visit (INDEPENDENT_AMBULATORY_CARE_PROVIDER_SITE_OTHER): Payer: Self-pay | Admitting: Pulmonary Disease

## 2022-02-23 VITALS — BP 138/62 | HR 102 | Ht 71.0 in | Wt 325.4 lb

## 2022-02-23 DIAGNOSIS — R0609 Other forms of dyspnea: Secondary | ICD-10-CM

## 2022-02-23 DIAGNOSIS — Z5900 Homelessness unspecified: Secondary | ICD-10-CM

## 2022-02-23 LAB — PULMONARY FUNCTION TEST
DL/VA % pred: 149 %
DL/VA: 6.41 ml/min/mmHg/L
DLCO cor % pred: 108 %
DLCO cor: 31.55 ml/min/mmHg
DLCO unc % pred: 102 %
DLCO unc: 30.03 ml/min/mmHg
FEF 25-75 Post: 2.35 L/s
FEF 25-75 Pre: 2.01 L/s
FEF2575-%Change-Post: 16 %
FEF2575-%Pred-Post: 71 %
FEF2575-%Pred-Pre: 61 %
FEV1-%Change-Post: 1 %
FEV1-%Pred-Post: 67 %
FEV1-%Pred-Pre: 66 %
FEV1-Post: 2.63 L
FEV1-Pre: 2.58 L
FEV1FVC-%Change-Post: 0 %
FEV1FVC-%Pred-Pre: 94 %
FEV6-%Change-Post: 1 %
FEV6-%Pred-Post: 74 %
FEV6-%Pred-Pre: 73 %
FEV6-Post: 3.61 L
FEV6-Pre: 3.55 L
FEV6FVC-%Change-Post: 0 %
FEV6FVC-%Pred-Post: 104 %
FEV6FVC-%Pred-Pre: 103 %
FVC-%Change-Post: 2 %
FVC-%Pred-Post: 72 %
FVC-%Pred-Pre: 70 %
FVC-Post: 3.67 L
FVC-Pre: 3.58 L
Post FEV1/FVC ratio: 72 %
Post FEV6/FVC ratio: 100 %
Pre FEV1/FVC ratio: 72 %
Pre FEV6/FVC Ratio: 100 %
RV % pred: 119 %
RV: 2.65 L
TLC % pred: 92 %
TLC: 6.61 L

## 2022-02-23 MED ORDER — ANORO ELLIPTA 62.5-25 MCG/ACT IN AEPB: 1.0000 | INHALATION_SPRAY | Freq: Every day | RESPIRATORY_TRACT | 0 refills | Status: DC

## 2022-02-23 NOTE — Progress Notes (Signed)
@Patient  ID: , male    DOB: 01/08/1966, 56 y.o.   MRN: [id number]  Chief Complaint  Patient presents with   Follow-up    Pt is here for DOE follow up. Pt states his breathing is steady and not getting worse. Pt is on Breo daily and Albuterol as needed. Pt had full PFTs done today     Referring provider: [id number], MD  HPI:   56 y.o. man whom we are seeing in follow up for dyspnea on exertion.    Symptoms unchanged at last visit.  Intermittently out of Breo.  Only get samples at PCP office.  Cannot afford medication.  Sometimes runs out.  Continue sleepiness car.  Reviewed echocardiogram obtained in interim since last visit.  Normal and reassuring.  Reviewed PFTs obtained.  Spirometry with mild restriction versus air trapping, air trapping present on lung volumes, DLCO within normal limits.  Consistent with likely asthma.  Explained in detail.  He is presents frustration with symptoms.  His circumstances.  HPI at initial visit: Patient notes chronic dyspnea on exertion.  Bit worse over the last few months.  Associated weight gain of about 20 pounds.  Relatively rapid over the last couple of months.  Denies significant change in diet.  No change in exercise habits etc.  Dyspnea worse on inclines or stairs.  No time of day when better or worse.  No position make things better or worse.  No seasonal or environmental factors he can identify to make things better or worse.  No alleviating or exacerbating factors.  Reviewed chest x-ray 08/2021 that reveals clear lungs bilaterally on my review interpretation.  Reviewed CT chest 10/01/2021 that reveals clear lungs bilaterally on my review and interpretation.  PMH: GERD Surgical history: No significant surgeries on review Family history: No respiratory illnesses in first-degree relatives Social history: Never smoker, lives in [place] / Pulmonary Flowsheets:   ACT:      No data to display            MMRC:     No data to display           Epworth:      No data to display           Tests:   FENO:  No results found for: "NITRICOXIDE"  PFT:    Latest Ref Rng & Units 02/23/2022   10:54 AM  PFT Results  FVC-Pre L 3.58  P  FVC-Predicted Pre % 70  P  FVC-Post L 3.67  P  FVC-Predicted Post % 72  P  Pre FEV1/FVC % % 72  P  Post FEV1/FCV % % 72  P  FEV1-Pre L 2.58  P  FEV1-Predicted Pre % 66  P  FEV1-Post L 2.63  P  DLCO uncorrected ml/min/mmHg 30.03  P  DLCO UNC% % 102  P  DLCO corrected ml/min/mmHg 31.55  P  DLCO COR %Predicted % 108  P  DLVA Predicted % 149  P  TLC L 6.61  P  TLC % Predicted % 92  P  RV % Predicted % 119  P    P Preliminary result   Personally reviewed and served as spirometry suggestive of air trapping versus mild restriction, no bronchodilator response, air trapping present on lung volumes, DLCO within normal notes, commendation reflects likely asthma.  WALK:      No data to display           Imaging:  Personally reviewed and as per EMR and discussion this note  Lab Results: Personally reviewed CBC    Component Value Date/Time   WBC 10.7 (H) 01/29/2022 1012   RBC 5.02 01/29/2022 1012   HGB 13.0 01/29/2022 1012   HGB 12.4 (L) 12/16/2021 1253   HGB 10.7 (L) 03/14/2021 1011   HCT 39.7 01/29/2022 1012   HCT 35.9 (L) 03/14/2021 1011   PLT 346 01/29/2022 1012   PLT 356 12/16/2021 1253   PLT 450 03/14/2021 1011   MCV 79.1 (L) 01/29/2022 1012   MCV 74 (L) 03/14/2021 1011   MCH 25.9 (L) 01/29/2022 1012   MCHC 32.7 01/29/2022 1012   RDW 19.8 (H) 01/29/2022 1012   RDW 15.1 03/14/2021 1011   LYMPHSABS 0.9 12/16/2021 1253   LYMPHSABS 1.0 03/14/2021 1011   MONOABS 0.8 12/16/2021 1253   EOSABS 0.6 (H) 12/16/2021 1253   EOSABS 0.4 03/14/2021 1011   BASOSABS 0.1 12/16/2021 1253   BASOSABS 0.0 03/14/2021 1011    BMET    Component Value Date/Time   NA 138 01/29/2022 1012   NA 139 03/14/2021 1011   K 4.1 01/29/2022  1012   CL 109 01/29/2022 1012   CO2 24 01/29/2022 1012   GLUCOSE 101 (H) 01/29/2022 1012   BUN 23 (H) 01/29/2022 1012   BUN 18 03/14/2021 1011   CREATININE 1.06 01/29/2022 1012   CREATININE 1.06 10/27/2021 1006   CREATININE 1.26 05/29/2014 0847   CALCIUM 8.7 (L) 01/29/2022 1012   GFRNONAA >60 01/29/2022 1012   GFRNONAA >60 10/27/2021 1006   GFRNONAA 67 05/29/2014 0847   GFRAA 94 09/14/2017 1157   GFRAA 77 05/29/2014 0847    BNP No results found for: "BNP"  ProBNP No results found for: "PROBNP"  Specialty Problems       Pulmonary Problems   Dyspnea   OSA (obstructive sleep apnea)    NPSG 04/30/14- positive for moderate obstructive sleep apnea, AHI 16.9 per hour with moderate snoring and desaturation to 86%. CPAP titrated to 14 CWP  01/02/2018-sleep study- severe obstructive sleep apnea with an AHI of 57.1  MSLT>>>       Asthma   COPD (chronic obstructive pulmonary disease) (HCC)    No Known Allergies  Immunization History  Administered Date(s) Administered   PFIZER(Purple Top)SARS-COV-2 Vaccination 04/19/2020   Tdap 11/08/2013    Past Medical History:  Diagnosis Date   ADD (attention deficit disorder)    ADD (attention deficit disorder)    Allergy    Anemia, iron deficiency    Anxiety    Asthma    Blood transfusion    Constipation    Depression    Depression    Exertional dyspnea    secondary to anemia   GERD (gastroesophageal reflux disease)    Hiatal hernia    Narcolepsy    pt also reports episodes of passing out that are unrelated to narcolepsy about a year ago   Sleep apnea 01/2018   has a CPAP, "unable to use it"    Tobacco History: Social History   Tobacco Use  Smoking Status Never  Smokeless Tobacco Never   Counseling given: Not Answered   Continue to not smoke  Outpatient Encounter Medications as of 02/23/2022  Medication Sig   albuterol (VENTOLIN HFA) 108 (90 Base) MCG/ACT inhaler Inhale 1-2 puffs into the lungs every 6 (six)  hours as needed for wheezing or shortness of breath.   fluticasone furoate-vilanterol (BREO ELLIPTA) 100-25 MCG/ACT AEPB Inhale 1 puff into the  lungs daily.   nystatin (MYCOSTATIN/NYSTOP) powder Apply to affectecd area(s) to skin 2 times daily.   pantoprazole (PROTONIX) 40 MG tablet Take 1 tablet (40 mg total) by mouth 2 (two) times daily.   umeclidinium-vilanterol (ANORO ELLIPTA) 62.5-25 MCG/ACT AEPB Inhale 1 puff into the lungs daily.   No facility-administered encounter medications on file as of 02/23/2022.     Review of Systems  Review of Systems  N/a Physical Exam  BP 138/62 (BP Location: Right Arm, Patient Position: Sitting, Cuff Size: Normal)   Pulse (!) 102   Ht 5\' 11"  (1.803 m)   Wt (!) 325 lb 6.4 oz (147.6 kg)   SpO2 92%   BMI 45.38 kg/m   Wt Readings from Last 5 Encounters:  02/23/22 (!) 325 lb 6.4 oz (147.6 kg)  02/10/22 (!) 323 lb 12.8 oz (146.9 kg)  01/29/22 (!) 320 lb (145.2 kg)  01/13/22 (!) 322 lb 12.8 oz (146.4 kg)  12/23/21 (!) 330 lb (149.7 kg)    BMI Readings from Last 5 Encounters:  02/23/22 45.38 kg/m  02/10/22 45.16 kg/m  01/29/22 44.63 kg/m  01/13/22 45.02 kg/m  12/23/21 46.03 kg/m     Physical Exam General: Sitting in chair, no acute distress Eyes: EOMI, no icterus Neck: Supple, no JVP Pulmonary: Clear, no work of breathing Cardiovascular: Regular rhythm, no murmur Abdomen: Slight distention, soft, bowel sounds present MSK: No synovitis, no joint effusion Neuro: Normal gait, no weakness Psych: Normal mood, full affect  Assessment & Plan:   Dyspnea on exertion: Concern for deconditioning.  Also concern for contribution of weight given report of increased weight.  TTE largely within normal limits.  PFTs suggestive of mild signs of asthma with air trapping and increased DLCO.  Given air trapping and lack of improvement with Breo, transition to dual bronchodilators with Anoro once daily.  Unfortunate, cannot afford medications.  Social  work consult placed today.   Return in about 3 months (around 05/26/2022).   05/28/2022, MD 02/23/2022

## 2022-02-23 NOTE — Patient Instructions (Addendum)
Nice to see you again  PFT showed signs of asthma  Recommend trying a new inhaler - Anoro - 1 puff once a day  If it seems to help we will try to provide samples. Send a message when you are running low  Return to clinic in 3 months or sooner as needed

## 2022-02-23 NOTE — Patient Instructions (Signed)
Full PFT Completed Today 

## 2022-02-23 NOTE — Progress Notes (Signed)
Full PFT Completed Today 

## 2022-02-26 ENCOUNTER — Ambulatory Visit (HOSPITAL_COMMUNITY): Payer: Self-pay | Attending: Family Medicine

## 2022-02-26 ENCOUNTER — Encounter (HOSPITAL_COMMUNITY): Payer: Self-pay

## 2022-02-27 ENCOUNTER — Ambulatory Visit: Payer: Self-pay | Admitting: Family Medicine

## 2022-03-05 ENCOUNTER — Ambulatory Visit: Payer: Self-pay | Admitting: Neurology

## 2022-03-05 ENCOUNTER — Encounter: Payer: Self-pay | Admitting: Neurology

## 2022-03-06 ENCOUNTER — Other Ambulatory Visit (HOSPITAL_BASED_OUTPATIENT_CLINIC_OR_DEPARTMENT_OTHER): Payer: Self-pay

## 2022-03-11 ENCOUNTER — Encounter: Payer: Self-pay | Admitting: Pulmonary Disease

## 2022-03-11 MED ORDER — ANORO ELLIPTA 62.5-25 MCG/ACT IN AEPB: 1.0000 | INHALATION_SPRAY | Freq: Every day | RESPIRATORY_TRACT | 0 refills | Status: DC

## 2022-03-19 ENCOUNTER — Other Ambulatory Visit: Payer: Self-pay | Admitting: Hematology and Oncology

## 2022-03-19 ENCOUNTER — Inpatient Hospital Stay: Payer: Self-pay | Attending: Hematology and Oncology | Admitting: Hematology and Oncology

## 2022-03-19 ENCOUNTER — Inpatient Hospital Stay: Payer: Self-pay

## 2022-03-19 ENCOUNTER — Other Ambulatory Visit: Payer: Self-pay

## 2022-03-19 DIAGNOSIS — K922 Gastrointestinal hemorrhage, unspecified: Secondary | ICD-10-CM | POA: Insufficient documentation

## 2022-03-19 DIAGNOSIS — D500 Iron deficiency anemia secondary to blood loss (chronic): Secondary | ICD-10-CM | POA: Insufficient documentation

## 2022-03-19 DIAGNOSIS — R5382 Chronic fatigue, unspecified: Secondary | ICD-10-CM

## 2022-03-19 DIAGNOSIS — D508 Other iron deficiency anemias: Secondary | ICD-10-CM

## 2022-03-19 LAB — CMP (CANCER CENTER ONLY)
ALT: 13 U/L (ref 0–44)
AST: 14 U/L — ABNORMAL LOW (ref 15–41)
Albumin: 4 g/dL (ref 3.5–5.0)
Alkaline Phosphatase: 100 U/L (ref 38–126)
Anion gap: 7 (ref 5–15)
BUN: 20 mg/dL (ref 6–20)
CO2: 24 mmol/L (ref 22–32)
Calcium: 9.3 mg/dL (ref 8.9–10.3)
Chloride: 106 mmol/L (ref 98–111)
Creatinine: 1.05 mg/dL (ref 0.61–1.24)
GFR, Estimated: >60 mL/min (ref >60)
Glucose, Bld: 104 mg/dL — ABNORMAL HIGH (ref 70–99)
Potassium: 4.5 mmol/L (ref 3.5–5.1)
Sodium: 137 mmol/L (ref 135–145)
Total Bilirubin: 0.3 mg/dL (ref 0.3–1.2)
Total Protein: 7.5 g/dL (ref 6.5–8.1)

## 2022-03-19 LAB — CBC WITH DIFFERENTIAL (CANCER CENTER ONLY)
Abs Immature Granulocytes: 0.08 10*3/uL — ABNORMAL HIGH (ref 0.00–0.07)
Basophils Absolute: 0.1 10*3/uL (ref 0.0–0.1)
Basophils Relative: 1 %
Eosinophils Absolute: 0.5 10*3/uL (ref 0.0–0.5)
Eosinophils Relative: 5 %
HCT: 41.9 % (ref 39.0–52.0)
Hemoglobin: 13.7 g/dL (ref 13.0–17.0)
Immature Granulocytes: 1 %
Lymphocytes Relative: 9 %
Lymphs Abs: 1 10*3/uL (ref 0.7–4.0)
MCH: 26.2 pg (ref 26.0–34.0)
MCHC: 32.7 g/dL (ref 30.0–36.0)
MCV: 80.1 fL (ref 80.0–100.0)
Monocytes Absolute: 0.6 10*3/uL (ref 0.1–1.0)
Monocytes Relative: 6 %
Neutro Abs: 8.2 10*3/uL — ABNORMAL HIGH (ref 1.7–7.7)
Neutrophils Relative %: 78 %
Platelet Count: 382 10*3/uL (ref 150–400)
RBC: 5.23 MIL/uL (ref 4.22–5.81)
RDW: 15.5 % (ref 11.5–15.5)
WBC Count: 10.4 10*3/uL (ref 4.0–10.5)
nRBC: 0 % (ref 0.0–0.2)

## 2022-03-19 LAB — IRON AND IRON BINDING CAPACITY (CC-WL,HP ONLY)
Iron: 47 ug/dL (ref 45–182)
Saturation Ratios: 13 % — ABNORMAL LOW (ref 17.9–39.5)
TIBC: 367 ug/dL (ref 250–450)
UIBC: 320 ug/dL (ref 117–376)

## 2022-03-19 LAB — RETIC PANEL
Immature Retic Fract: 31.2 % — ABNORMAL HIGH (ref 2.3–15.9)
RBC.: 5.16 MIL/uL (ref 4.22–5.81)
Retic Count, Absolute: 114.6 10*3/uL (ref 19.0–186.0)
Retic Ct Pct: 2.2 % (ref 0.4–3.1)
Reticulocyte Hemoglobin: 29.6 pg (ref >27.9)

## 2022-03-19 LAB — FERRITIN: Ferritin: 44 ng/mL (ref 24–336)

## 2022-03-22 ENCOUNTER — Encounter: Payer: Self-pay | Admitting: Physician Assistant

## 2022-03-22 NOTE — Progress Notes (Signed)
Patient had labs collected but left facility prior to clinic visit. Will reschedule.   Ulysees Barns, MD Department of Hematology/Oncology [facility] at [facility] Phone: [phone] Pager: [phone] Email: [email or web address]

## 2022-03-27 ENCOUNTER — Other Ambulatory Visit (HOSPITAL_COMMUNITY): Payer: Self-pay

## 2022-03-27 ENCOUNTER — Encounter: Payer: Self-pay | Admitting: Physician Assistant

## 2022-04-07 ENCOUNTER — Telehealth: Payer: Self-pay | Admitting: Hematology and Oncology

## 2022-04-07 NOTE — Telephone Encounter (Signed)
Scheduled per  8/1 in basket, message has been left  

## 2022-04-21 ENCOUNTER — Encounter: Payer: Self-pay | Admitting: Pulmonary Disease

## 2022-04-21 MED ORDER — ANORO ELLIPTA 62.5-25 MCG/ACT IN AEPB: 1.0000 | INHALATION_SPRAY | Freq: Every day | RESPIRATORY_TRACT | 0 refills | Status: DC

## 2022-07-07 ENCOUNTER — Other Ambulatory Visit: Payer: Self-pay | Admitting: Physician Assistant

## 2022-07-07 DIAGNOSIS — D500 Iron deficiency anemia secondary to blood loss (chronic): Secondary | ICD-10-CM

## 2022-07-08 ENCOUNTER — Inpatient Hospital Stay: Payer: Self-pay | Admitting: Physician Assistant

## 2022-07-08 ENCOUNTER — Inpatient Hospital Stay: Payer: Self-pay | Attending: Family Medicine

## 2022-12-17 ENCOUNTER — Encounter: Payer: Self-pay | Admitting: Pulmonary Disease

## 2022-12-18 ENCOUNTER — Ambulatory Visit (INDEPENDENT_AMBULATORY_CARE_PROVIDER_SITE_OTHER): Payer: Self-pay | Admitting: Family Medicine

## 2022-12-18 ENCOUNTER — Other Ambulatory Visit (HOSPITAL_COMMUNITY): Payer: Self-pay

## 2022-12-18 ENCOUNTER — Encounter: Payer: Self-pay | Admitting: Family Medicine

## 2022-12-18 ENCOUNTER — Encounter: Payer: Self-pay | Admitting: Physician Assistant

## 2022-12-18 VITALS — BP 141/89 | HR 82 | Ht 71.0 in | Wt 287.0 lb

## 2022-12-18 DIAGNOSIS — J45909 Unspecified asthma, uncomplicated: Secondary | ICD-10-CM

## 2022-12-18 DIAGNOSIS — G4733 Obstructive sleep apnea (adult) (pediatric): Secondary | ICD-10-CM

## 2022-12-18 DIAGNOSIS — K259 Gastric ulcer, unspecified as acute or chronic, without hemorrhage or perforation: Secondary | ICD-10-CM

## 2022-12-18 DIAGNOSIS — D649 Anemia, unspecified: Secondary | ICD-10-CM

## 2022-12-18 DIAGNOSIS — F329 Major depressive disorder, single episode, unspecified: Secondary | ICD-10-CM

## 2022-12-18 DIAGNOSIS — N281 Cyst of kidney, acquired: Secondary | ICD-10-CM

## 2022-12-18 DIAGNOSIS — Z609 Problem related to social environment, unspecified: Secondary | ICD-10-CM

## 2022-12-18 LAB — POCT HEMOGLOBIN: Hemoglobin: 11.8 g/dL (ref 11–14.6)

## 2022-12-18 MED ORDER — ALBUTEROL SULFATE HFA 108 (90 BASE) MCG/ACT IN AERS
1.0000 | INHALATION_SPRAY | Freq: Four times a day (QID) | RESPIRATORY_TRACT | 3 refills | Status: DC | PRN
  Filled 2022-12-18: qty 6.7, 25d supply, fill #0

## 2022-12-18 MED ORDER — PANTOPRAZOLE SODIUM 40 MG PO TBEC
40.0000 mg | DELAYED_RELEASE_TABLET | Freq: Every day | ORAL | 1 refills | Status: DC
Start: 2022-12-18 — End: 2023-01-12
  Filled 2022-12-18: qty 30, 30d supply, fill #0

## 2022-12-18 NOTE — Assessment & Plan Note (Signed)
No SI. He felt overwhelmed by his recent arrest. I provided counseling and psychiatry resources. He will call for his appointments.

## 2022-12-18 NOTE — Assessment & Plan Note (Signed)
Still not on CPAP due to housing. Housing resources provided. F/U with Pulm for reassessment for CPAP.

## 2022-12-18 NOTE — Addendum Note (Signed)
Addended by: Janit Pagan T on: 12/18/2022 11:59 AM   Modules accepted: Orders

## 2022-12-18 NOTE — Assessment & Plan Note (Signed)
Deferring renal U/S till he gets settled and obtain his insurance.

## 2022-12-18 NOTE — Assessment & Plan Note (Addendum)
Albuterol refilled to [facility] outpatient pharmacy with [facility] indigent fund. Unfortunately we don't have Anoro Samples nor will indigent fund cover it. He said he already reached out to his pulmonologist for samples.  F/U as needed.

## 2022-12-18 NOTE — Assessment & Plan Note (Signed)
Hemoglobin looks good today. Monitor closely.

## 2022-12-18 NOTE — Assessment & Plan Note (Signed)
Housing and health insurance issue. I provided housing and health insurance resources. He will call phone numbers listed.

## 2022-12-18 NOTE — Progress Notes (Addendum)
    SUBJECTIVE:   CHIEF COMPLAINT / HPI:   Anemia: Here for lab test. He feels fatigues all the time.   OSA/Asthma: He has been out of Anoro for a few months. He is compliant with albuterol prn. He endorses SOB with exertion but no other cardiopulmonary symptoms. Still does not wear a sleep machine due to housing issues.   Renal cyst: He did not f/u with U/S as scheduled. No new concerns. He will like to get his U/S done at some point.   Social Issue/Anxiety/Depression: The patient got out of jail eight days ago. He was incarcerated seven months ago for theft at different [employer]. He felt maltreated by law enforcement, such that he has a lot of stress and anxiety about it. He felt he would benefit from counseling. He does not want this information to be shared with anyone as he feels ashamed.  PERTINENT  PMH / PSH: PMHx reviewed.  OBJECTIVE:   BP (!) 141/89   Pulse 82   Ht 5\' 11"  (1.803 m)   Wt 287 lb (130.2 kg)   SpO2 97%   BMI 40.03 kg/m   Physical Exam Vitals and nursing note reviewed.  Cardiovascular:     Rate and Rhythm: Normal rate.     Heart sounds: Normal heart sounds. No murmur heard. Pulmonary:     Effort: Pulmonary effort is normal. No respiratory distress.     Breath sounds: Normal breath sounds. No wheezing.      ASSESSMENT/PLAN:   Asthma Albuterol refilled to [facility] outpatient pharmacy with [facility] indigent fund. Unfortunately we don't have Anoro Samples nor will indigent fund cover it. He said he already reached out to his pulmonologist for samples.  F/U as needed.  OSA (obstructive sleep apnea) Still not on CPAP due to housing. Housing resources provided. F/U with Pulm for reassessment for CPAP.  Anemia Hemoglobin looks good today. Monitor closely.   Renal cyst Deferring renal U/S till he gets settled and obtain his insurance.  Depression No SI. He felt overwhelmed by his recent arrest. I provided counseling and psychiatry resources. He  will call for his appointments.  Social problem Housing and health insurance issue. I provided housing and health insurance resources. He will call phone numbers listed.   He declined COVID shot  Janit Pagan, MD [facility]

## 2022-12-18 NOTE — Patient Instructions (Addendum)
To see a psychiatry provider, please contact:  [facility]  [address] [place], [place] [phone] Crisis [phone]    [facility] [phone] or apply online at [facility] - ePASS Orange Card - Contact [facility] at [phone] [employer] - to purchase insurance from the Marketplace or enroll in [place] benefits [phone]   [facility] to apply for Section 8 housing [phone] [employer] (home repairs. Must own your own home and meet income requirements) - [phone]

## 2022-12-21 ENCOUNTER — Other Ambulatory Visit (HOSPITAL_COMMUNITY): Payer: Self-pay

## 2022-12-21 ENCOUNTER — Telehealth: Payer: Self-pay

## 2022-12-21 NOTE — Telephone Encounter (Signed)
PT calling again for sample inhaler. Pls call to advise. I did not see anything in the box for him up front. His # is [phone]

## 2022-12-21 NOTE — Telephone Encounter (Signed)
Mailing [employer] application to patients home for Anoro assistance.   Albuterol and pantoprazole have no programs and have generics available at our outpatient pharmacies.

## 2022-12-21 NOTE — Telephone Encounter (Signed)
-----   Message from [name], MD sent at 12/18/2022 11:58 AM EDT ----- [name],  Can you help patient with MAP application for Albuterol, Protonix and Anoro?  Thanks   [employer]

## 2022-12-22 NOTE — Telephone Encounter (Signed)
Left message requesting call back regarding PAP application. No address on file, wanted to f/u on how patient would like to go about completing application.  Call back [phone]

## 2023-01-05 ENCOUNTER — Telehealth: Payer: Self-pay

## 2023-01-05 NOTE — Telephone Encounter (Signed)
error 

## 2023-01-12 ENCOUNTER — Ambulatory Visit (INDEPENDENT_AMBULATORY_CARE_PROVIDER_SITE_OTHER): Payer: Self-pay | Admitting: Primary Care

## 2023-01-12 ENCOUNTER — Encounter: Payer: Self-pay | Admitting: Physician Assistant

## 2023-01-12 ENCOUNTER — Ambulatory Visit (INDEPENDENT_AMBULATORY_CARE_PROVIDER_SITE_OTHER): Payer: Self-pay | Admitting: Family Medicine

## 2023-01-12 ENCOUNTER — Other Ambulatory Visit (HOSPITAL_COMMUNITY): Payer: Self-pay

## 2023-01-12 ENCOUNTER — Encounter: Payer: Self-pay | Admitting: Primary Care

## 2023-01-12 ENCOUNTER — Encounter: Payer: Self-pay | Admitting: Family Medicine

## 2023-01-12 VITALS — BP 139/89 | HR 87 | Ht 71.0 in | Wt 287.4 lb

## 2023-01-12 VITALS — BP 120/76 | HR 92 | Temp 98.3°F | Ht 71.0 in | Wt 288.6 lb

## 2023-01-12 DIAGNOSIS — G4733 Obstructive sleep apnea (adult) (pediatric): Secondary | ICD-10-CM

## 2023-01-12 DIAGNOSIS — J45909 Unspecified asthma, uncomplicated: Secondary | ICD-10-CM

## 2023-01-12 DIAGNOSIS — R0683 Snoring: Secondary | ICD-10-CM

## 2023-01-12 DIAGNOSIS — K259 Gastric ulcer, unspecified as acute or chronic, without hemorrhage or perforation: Secondary | ICD-10-CM

## 2023-01-12 DIAGNOSIS — F329 Major depressive disorder, single episode, unspecified: Secondary | ICD-10-CM

## 2023-01-12 DIAGNOSIS — Z5900 Homelessness unspecified: Secondary | ICD-10-CM

## 2023-01-12 MED ORDER — PANTOPRAZOLE SODIUM 40 MG PO TBEC
40.0000 mg | DELAYED_RELEASE_TABLET | Freq: Every day | ORAL | 1 refills | Status: DC
Start: 2023-01-12 — End: 2023-08-21
  Filled 2023-01-12: qty 90, 90d supply, fill #0
  Filled 2023-04-23: qty 90, 90d supply, fill #1

## 2023-01-12 MED ORDER — TRELEGY ELLIPTA 100-62.5-25 MCG/ACT IN AEPB: 1.0000 | INHALATION_SPRAY | Freq: Every day | RESPIRATORY_TRACT | 0 refills | Status: DC

## 2023-01-12 MED ORDER — ALBUTEROL SULFATE HFA 108 (90 BASE) MCG/ACT IN AERS
1.0000 | INHALATION_SPRAY | Freq: Four times a day (QID) | RESPIRATORY_TRACT | 3 refills | Status: DC | PRN
  Filled 2023-01-12: qty 6.7, 25d supply, fill #0
  Filled 2023-02-17: qty 6.7, 25d supply, fill #1

## 2023-01-12 MED ORDER — ANORO ELLIPTA 62.5-25 MCG/ACT IN AEPB: 1.0000 | INHALATION_SPRAY | Freq: Every day | RESPIRATORY_TRACT | 4 refills | Status: DC

## 2023-01-12 NOTE — Patient Instructions (Addendum)
It was nice seeing you today. I am glad you are now connected with a Psychologist. Please follow up with them as planned.  The Pharmacy Tech tried to reach you to help with medication assistant for your anoro inhaler. She has no address on file for you and she wanted to follow up on how you would like to go about completing application.  Please call Ms. Siri Cole at [phone].

## 2023-01-12 NOTE — Telephone Encounter (Signed)
Patient came by office to complete [employer] application.   Could a hardcopy RX for Anoro Ellipta (90 day supply w/ 4 refill, if possible) be printed, signed, and placed in my box? Will need to send this with application.

## 2023-01-12 NOTE — Assessment & Plan Note (Signed)
I discussed connecting him with a Child psychotherapist. Unfortunately, he does not have a telephone access and might be difficult to connect with him for CCM appointment. He also does not have insurance coverage. Social worker resources provided in the past. We can readdress connecting him once he leaves the [facility] program.

## 2023-01-12 NOTE — Assessment & Plan Note (Signed)
-   Stable; No acute respiratory symptoms. Patient is applying for PA for Anoro, difficulty affording medication. Unfortunately we did not have samples of ANORO today so he was provided a sample of Trelegy until he can get medication through patient assistance.

## 2023-01-12 NOTE — Telephone Encounter (Signed)
Submitted application for ANORO to [employer] for patient assistance.   Phone: [phone]  

## 2023-01-12 NOTE — Assessment & Plan Note (Signed)
NO HI or SI F/U Psych as planned.

## 2023-01-12 NOTE — Assessment & Plan Note (Signed)
Good oxygen saturation. No acute change from baseline. I refilled his albuterol and connected him with the pharmacy tech to help with Med assistance for his Anoro.

## 2023-01-12 NOTE — Assessment & Plan Note (Addendum)
-   Patient has severe OSA, unable to afford CPAP machine. He received a [employer] system one machine from a friend. Patient needs to bring current CPAP by [employer] to see if we are able to use machine. Pressure settings likely need to be adjusted, recommending either auto settings 5-15cm h20 or set pressure 10cm h20 (we can re-adjust after as needed). He will need mask fitting and needs to be provided SD card. Advised patient aim to wear CPAP nightly for 4-6 hours or longer. FU in 6 weeks or sooner if needed.

## 2023-01-12 NOTE — Progress Notes (Signed)
@Patient  ID: [name], male    DOB: 10-22-1965, 57 y.o.   MRN: [id number]  Chief Complaint  Patient presents with   Follow-up    Wants to use CPAP again.  Has a CPAP that a friend gave him.  Patient is in a residential program and he cannot do a sleep test.  Last sleep study 01/02/2018    Referring provider: [name], MD  HPI: 57 year old male, never smoked. PMH significant for OSA, asthma, GERD, ADHD, iron deficiency anemia, morbid obesity.   01/12/2023 Patient presents today for for overdue OSA follow-up.  He has severe sleep apnea. Unable to afford CPAP and most medications. He is [facility] living [facility]. He was able to get a used CPAP machine from a friend. He has a [employer] system one. Apply for patient assistance for Anoro.   NPSG 04/30/14- positive for moderate obstructive sleep apnea, AHI 16.9 per hour with moderate snoring and desaturation to 86%. CPAP titrated to 14 CWP  01/02/2018-sleep study- severe obstructive sleep apnea with an AHI of 57.1     No Known Allergies  Immunization History  Administered Date(s) Administered   PFIZER(Purple Top)SARS-COV-2 Vaccination 04/19/2020   Tdap 11/08/2013    Past Medical History:  Diagnosis Date   ADD (attention deficit disorder)    ADD (attention deficit disorder)    Allergy    Anemia, iron deficiency    Anxiety    Asthma    Blood transfusion    Constipation    Depression    Depression    Exertional dyspnea    secondary to anemia   GERD (gastroesophageal reflux disease)    Hiatal hernia    Narcolepsy    pt also reports episodes of passing out that are unrelated to narcolepsy about a year ago   Sleep apnea 01/2018   has a CPAP, "unable to use it"    Tobacco History: Social History   Tobacco Use  Smoking Status Never  Smokeless Tobacco Never   Counseling given: Not Answered   Outpatient Medications Prior to Visit  Medication Sig Dispense Refill   albuterol  (VENTOLIN HFA) 108 (90 Base) MCG/ACT inhaler Inhale 1-2 puffs into the lungs every 6 (six) hours as needed for wheezing or shortness of breath. 6.7 g 3   pantoprazole (PROTONIX) 40 MG tablet Take 1 tablet (40 mg total) by mouth daily. 90 tablet 1   umeclidinium-vilanterol (ANORO ELLIPTA) 62.5-25 MCG/ACT AEPB Inhale 1 puff into the lungs daily. 90 each 4   nystatin (MYCOSTATIN/NYSTOP) powder Apply to affectecd area(s) to skin 2 times daily. (Patient not taking: Reported on 12/18/2022) 15 g 0   No facility-administered medications prior to visit.      Review of Systems  Review of Systems  Constitutional: Negative.   HENT: Negative.    Respiratory: Negative.       Physical Exam  BP 120/76 (BP Location: Left Arm, Patient Position: Sitting, Cuff Size: Large)   Pulse 92   Temp 98.3 F (36.8 C) (Oral)   Ht 5\' 11"  (1.803 m)   Wt 288 lb 9.6 oz (130.9 kg)   SpO2 95%   BMI 40.25 kg/m  Physical Exam Constitutional:      Appearance: Normal appearance. He is obese.  Cardiovascular:     Rate and Rhythm: Normal rate and regular rhythm.  Pulmonary:     Effort: Pulmonary effort is normal.     Breath sounds: Normal breath sounds.  Musculoskeletal:  General: Normal range of motion.  Skin:    General: Skin is warm and dry.  Neurological:     General: No focal deficit present.     Mental Status: He is alert and oriented to person, place, and time. Mental status is at baseline.  Psychiatric:        Mood and Affect: Mood normal.        Behavior: Behavior normal.        Thought Content: Thought content normal.        Judgment: Judgment normal.      Lab Results:  CBC    Component Value Date/Time   WBC 10.4 03/19/2022 1037   WBC 10.7 (H) 01/29/2022 1012   RBC 5.16 03/19/2022 1037   RBC 5.23 03/19/2022 1037   HGB 11.8 12/18/2022 1113   HGB 13.7 03/19/2022 1037   HGB 10.7 (L) 03/14/2021 1011   HCT 41.9 03/19/2022 1037   HCT 35.9 (L) 03/14/2021 1011   PLT 382 03/19/2022 1037    PLT 450 03/14/2021 1011   MCV 80.1 03/19/2022 1037   MCV 74 (L) 03/14/2021 1011   MCH 26.2 03/19/2022 1037   MCHC 32.7 03/19/2022 1037   RDW 15.5 03/19/2022 1037   RDW 15.1 03/14/2021 1011   LYMPHSABS 1.0 03/19/2022 1037   LYMPHSABS 1.0 03/14/2021 1011   MONOABS 0.6 03/19/2022 1037   EOSABS 0.5 03/19/2022 1037   EOSABS 0.4 03/14/2021 1011   BASOSABS 0.1 03/19/2022 1037   BASOSABS 0.0 03/14/2021 1011    BMET    Component Value Date/Time   NA 137 03/19/2022 1037   NA 139 03/14/2021 1011   K 4.5 03/19/2022 1037   CL 106 03/19/2022 1037   CO2 24 03/19/2022 1037   GLUCOSE 104 (H) 03/19/2022 1037   BUN 20 03/19/2022 1037   BUN 18 03/14/2021 1011   CREATININE 1.05 03/19/2022 1037   CREATININE 1.26 05/29/2014 0847   CALCIUM 9.3 03/19/2022 1037   GFRNONAA >60 03/19/2022 1037   GFRNONAA 67 05/29/2014 0847   GFRAA 94 09/14/2017 1157   GFRAA 77 05/29/2014 0847    BNP No results found for: "BNP"  ProBNP No results found for: "PROBNP"  Imaging: No results found.   Assessment & Plan:   OSA (obstructive sleep apnea) - Patient has severe OSA, unable to afford CPAP machine. He received a [employer] system one machine from a friend. Patient needs to bring current CPAP by [employer] to see if we are able to use machine. Pressure settings likely need to be adjusted, recommending either auto settings 5-15cm h20 or set pressure 10cm h20 (we can re-adjust after as needed). He will need mask fitting and needs to be provided SD card. Advised patient aim to wear CPAP nightly for 4-6 hours or longer. FU in 6 weeks or sooner if needed.   Asthma - Stable; No acute respiratory symptoms. Patient is applying for PA for Anoro, difficulty affording medication. Unfortunately we did not have samples of ANORO today so he was provided a sample of Trelegy until he can get medication through patient assistance.    Glenford Bayley, NP 01/12/2023

## 2023-01-12 NOTE — Assessment & Plan Note (Signed)
I discussed f/u with his pulmonologist to adjust his CPAP settings. He will contact their office to schedule a follow up appointment.

## 2023-01-12 NOTE — Patient Instructions (Addendum)
Recommendations: - Patient needs to bring current CPAP by medical supply store to see we are able to use machine, pressure settings likely need to be adjusted, needs device registered to him and needs to be fitting for mask   - We do not have samples of ANORO today, we can use Trelegy in place for 2 weeks until you get patient assistance (this is similar to Anoro, only difference is it has a low dose steroid in it. Rinse mouth after use)   - Aim to wear CPAP every night for 4-6 hours   Orders: - Please call Brad at [facility] and see if a patient would be able to bring by a [employer] system one machine by to get looked at/ serviced - Patient needs an order to establish with [facility] as new patient for CPAP supplies and needs SD card. He will need a mask fitting. Recommended auto CPAP pressure 5-15cm h20 or set pressure 10cm h20  Follow-up: - 6 weeks with Beth NP (bring SD card with you)   CPAP and BIPAP Information CPAP and BIPAP are methods that use air pressure to keep your airways open and to help you breathe well. CPAP and BIPAP use different amounts of pressure. Your health care provider will tell you whether CPAP or BIPAP would be more helpful for you. CPAP stands for "continuous positive airway pressure." With CPAP, the amount of pressure stays the same while you breathe in (inhale) and out (exhale). BIPAP stands for "bi-level positive airway pressure." With BIPAP, the amount of pressure will be higher when you inhale and lower when you exhale. This allows you to take larger breaths. CPAP or BIPAP may be used in the hospital, or your health care provider may want you to use it at home. You may need to have a sleep study before your health care provider can order a machine for you to use at home. What are the advantages? CPAP or BIPAP can be helpful if you have: Sleep apnea. Chronic obstructive pulmonary disease (COPD). Heart failure. Medical conditions that cause muscle weakness,  including muscular dystrophy or amyotrophic lateral sclerosis (ALS). Other problems that cause breathing to be shallow, weak, abnormal, or difficult. CPAP and BIPAP are most commonly used for obstructive sleep apnea (OSA) to keep the airways from collapsing when the muscles relax during sleep. What are the risks? Generally, this is a safe treatment. However, problems may occur, including: Irritated skin or skin sores if the mask does not fit properly. Dry or stuffy nose or nosebleeds. Dry mouth. Feeling gassy or bloated. Sinus or lung infection if the equipment is not cleaned properly. When should CPAP or BIPAP be used? In most cases, the mask only needs to be worn during sleep. Generally, the mask needs to be worn throughout the night and during any daytime naps. People with certain medical conditions may also need to wear the mask at other times, such as when they are awake. Follow instructions from your health care provider about when to use the machine. What happens during CPAP or BIPAP?  Both CPAP and BIPAP are provided by a small machine with a flexible plastic tube that attaches to a plastic mask that you wear. Air is blown through the mask into your nose or mouth. The amount of pressure that is used to blow the air can be adjusted on the machine. Your health care provider will set the pressure setting and help you find the best mask for you. Tips for using the  mask Because the mask needs to be snug, some people feel trapped or closed-in (claustrophobic) when first using the mask. If you feel this way, you may need to get used to the mask. One way to do this is to hold the mask loosely over your nose or mouth and then gradually apply the mask more snugly. You can also gradually increase the amount of time that you use the mask. Masks are available in various types and sizes. If your mask does not fit well, talk with your health care provider about getting a different one. Some common types of  masks include: Full face masks, which fit over the mouth and nose. Nasal masks, which fit over the nose. Nasal pillow or prong masks, which fit into the nostrils. If you are using a mask that fits over your nose and you tend to breathe through your mouth, a chin strap may be applied to help keep your mouth closed. Use a skin barrier to protect your skin as told by your health care provider. Some CPAP and BIPAP machines have alarms that may sound if the mask comes off or develops a leak. If you have trouble with the mask, it is very important that you talk with your health care provider about finding a way to make the mask easier to tolerate. Do not stop using the mask. There could be a negative impact on your health if you stop using the mask. Tips for using the machine Place your CPAP or BIPAP machine on a secure table or stand near an electrical outlet. Know where the on/off switch is on the machine. Follow instructions from your health care provider about how to set the pressure on your machine and when you should use it. Do not eat or drink while the CPAP or BIPAP machine is on. Food or fluids could get pushed into your lungs by the pressure of the CPAP or BIPAP. For home use, CPAP and BIPAP machines can be rented or purchased through home health care companies. Many different brands of machines are available. Renting a machine before purchasing may help you find out which particular machine works well for you. Your health insurance company may also decide which machine you may get. Keep the CPAP or BIPAP machine and attachments clean. Ask your health care provider for specific instructions. Check the humidifier if you have a dry stuffy nose or nosebleeds. Make sure it is working correctly. Follow these instructions at home: Take over-the-counter and prescription medicines only as told by your health care provider. Ask if you can take sinus medicine if your sinuses are blocked. Do not use any  products that contain nicotine or tobacco. These products include cigarettes, chewing tobacco, and vaping devices, such as e-cigarettes. If you need help quitting, ask your health care provider. Keep all follow-up visits. This is important. Contact a health care provider if: You have redness or pressure sores on your head, face, mouth, or nose from the mask or head gear. You have trouble using the CPAP or BIPAP machine. You cannot tolerate wearing the CPAP or BIPAP mask. Someone tells you that you snore even when wearing your CPAP or BIPAP. Get help right away if: You have trouble breathing. You feel confused. Summary CPAP and BIPAP are methods that use air pressure to keep your airways open and to help you breathe well. If you have trouble with the mask, it is very important that you talk with your health care provider about finding a way  to make the mask easier to tolerate. Do not stop using the mask. There could be a negative impact to your health if you stop using the mask. Follow instructions from your health care provider about when to use the machine. This information is not intended to replace advice given to you by your health care provider. Make sure you discuss any questions you have with your health care provider. Document Revised: 04/02/2021 Document Reviewed: 08/02/2020 Elsevier Patient Education  2023 [employer].

## 2023-01-12 NOTE — Progress Notes (Signed)
    SUBJECTIVE:   CHIEF COMPLAINT / HPI:   Asthma: No new concerns. He needs a refill of his albuterol. He is still out of his Anoro.   OSA: He wanted to change the settings on his CPAP machine and wondered if we could help with that.  Homelessness: He is now in a Teen/Adult challenge program where he participates in counseling related to addiction and behaviors for the next 6 months. He feels uncomfortable living in this place since he was never a drug addict. He is limited in what he can do while staying in this facility. He could not gain access to his cell phone. He mentioned he was not held against his will and could leave anytime. However, since he is homeless, he does not want to leave.   Depression: He has Psych appointment on 02/09/23. No SI  PERTINENT  PMH / PSH: PMHx reviewed  OBJECTIVE:   BP 139/89   Pulse 87   Ht 5\' 11"  (1.803 m)   Wt 287 lb 6.4 oz (130.4 kg)   SpO2 98%   BMI 40.08 kg/m   Physical Exam Vitals and nursing note reviewed.  Cardiovascular:     Rate and Rhythm: Normal rate and regular rhythm.     Heart sounds: No murmur heard. Pulmonary:     Effort: Pulmonary effort is normal. No respiratory distress.     Breath sounds: Normal breath sounds. No wheezing.  Psychiatric:        Mood and Affect: Mood is depressed.        Thought Content: Thought content does not include homicidal or suicidal ideation. Thought content does not include homicidal or suicidal plan.     Comments: Teary during this visit      ASSESSMENT/PLAN:   Asthma Good oxygen saturation. No acute change from baseline. I refilled his albuterol and connected him with the pharmacy tech to help with Med assistance for his Anoro.  OSA (obstructive sleep apnea) I discussed f/u with his pulmonologist to adjust his CPAP settings. He will contact their office to schedule a follow up appointment.   Depression NO HI or SI F/U Psych as planned.   Homelessness I discussed connecting  him with a Child psychotherapist. Unfortunately, he does not have a telephone access and might be difficult to connect with him for CCM appointment. He also does not have insurance coverage. Social worker resources provided in the past. We can readdress connecting him once he leaves the Teen/Adult challenge program.     Janit Pagan, MD [facility]

## 2023-01-13 ENCOUNTER — Telehealth: Payer: Self-pay | Admitting: Primary Care

## 2023-01-13 NOTE — Telephone Encounter (Signed)
Patient's caregiver is calling to ask NP Clent Ridges to update him on the patient's cpap machine.  He is trying to get this machine for the patient and was told there is no order put in for it.  Please advise and call to discuss at [phone]

## 2023-01-15 NOTE — Telephone Encounter (Signed)
Spoke with [name]. He states he had contacted [facility] in regards to patients cpap machine. He was advised an order had not been cpap for cpap machine. I advised order has been placed on 5/7 and confirmation received by Brad on 5/9. Lisabeth Devoid he would contact Brad through [employer] in regards to patient. I advised in note Nida Boatman was made aware of this patient and his circumstance. [name] stated he would call back with any questions or concerns. As of now nfn

## 2023-01-17 NOTE — Telephone Encounter (Signed)
PCC's, [facility] is telling the pt that they did not receive the referral but it states in referral notes that Brad confirmed. Please reach out and see what the issue is, thanks!

## 2023-01-18 ENCOUNTER — Encounter: Payer: Self-pay | Admitting: Physician Assistant

## 2023-01-18 ENCOUNTER — Ambulatory Visit (INDEPENDENT_AMBULATORY_CARE_PROVIDER_SITE_OTHER): Payer: Self-pay | Admitting: Family Medicine

## 2023-01-18 ENCOUNTER — Encounter: Payer: Self-pay | Admitting: Family Medicine

## 2023-01-18 ENCOUNTER — Other Ambulatory Visit: Payer: Self-pay

## 2023-01-18 ENCOUNTER — Other Ambulatory Visit (HOSPITAL_COMMUNITY): Payer: Self-pay

## 2023-01-18 VITALS — BP 125/90 | HR 92 | Ht 71.0 in | Wt 289.0 lb

## 2023-01-18 DIAGNOSIS — F329 Major depressive disorder, single episode, unspecified: Secondary | ICD-10-CM

## 2023-01-18 DIAGNOSIS — Z5900 Homelessness unspecified: Secondary | ICD-10-CM

## 2023-01-18 DIAGNOSIS — H02846 Edema of left eye, unspecified eyelid: Secondary | ICD-10-CM

## 2023-01-18 MED ORDER — DOXYCYCLINE HYCLATE 100 MG PO TABS
100.0000 mg | ORAL_TABLET | Freq: Two times a day (BID) | ORAL | 0 refills | Status: AC
  Filled 2023-01-18: qty 10, 5d supply, fill #0

## 2023-01-18 NOTE — Telephone Encounter (Signed)
Received notification from [employer] regarding approval for [facility] Patient assistance approved from 01/15/23 to 01/14/24.  Medication will ship to: [address] [place] [address]  Phone: [phone]

## 2023-01-18 NOTE — Progress Notes (Unsigned)
    SUBJECTIVE:   CHIEF COMPLAINT / HPI:   Swelling under eye Came up about 2-3 days ago. Just appeared overnight. Very painful with light touch. Has been red. He was in a church service at which time he wanted to come and get it further evaluated. No trauma to the eye. No lesions overlying the area. Swelling has been stable. No fevers, chills, bowel changes, or urinary changes. Has been nauseous with headaches over this period; however, he has a history of headaches. Never happened before. Has been on Trelegy most recently though longer than symptoms have been happening. No drainage noted. No pain with moving eyes. No vision problems.  Adult/Teen Challenge Program concerns Been in program for 24 days. He does not have substance use problems but is in the program given he is unhoused. He feels physically safe, though he feels as if the environment is not good for him. He is surrounded by people who would like to manipulate him. He would like resources to obtain alternative housing arrangements, though he cannot access his own cell phone there. However, he gives me permission to refer to CM/SW so they can call him at the facility to help find alternative housing; he would like [place] to not insinuate he is unhappy there, as he does not want any retribution.   OBJECTIVE:   BP (!) 125/90   Pulse 92   Ht 5\' 11"  (1.803 m)   Wt 131.1 kg   SpO2 97%   BMI 40.31 kg/m   General: Alert and oriented, in NAD Skin: Warm, dry, and intact without lesions HEENT: NCAT, midline nasal septum, erythematous and edematous infraorbital area on L side with overlying papule without central umbilication/pore, markedly tender to light palpation with induration noted centrally, EOM full and equal with only mild pain when looking to the L, no gross visual difficulties, PERRLA Cardiac: RRR, no m/r/g appreciated Respiratory: CTAB, breathing and speaking comfortably on RA Extremities: Moves all extremities grossly  equally Neurological: No gross focal deficit Psychiatric: Anxious mood and affect, PHQ-9 = 20 with negative #9    ASSESSMENT/PLAN:   Swollen infraorbital area Highest on differential is infected hair follicle or sebaceous gland given overlying erythema, tenderness, and induration. Less likely preseptal/postseptal cellulitis (no history of trauma, no visual effects, no pain with EOM), contact dermatitis/allergic source (no new exposures or medicines that would likely cause localized edema), or hypothyroidism/renal disease (would likely see more systemic edema and labs overall reassuring in the past). Will trial doxycycline 100 mg BID for 5 days with warm compresses to target infection. Advised patient to follow up next week to monitor resolution. Discussed returning sooner should visual defects occur, swelling continue with more pain, or pain begin with EOM.  Homelessness  Depression Will refer to CCM for aid in finding alternative housing. Will provide facility number and ask representative to simply refer to themselves as "part of Mr. Lex's health team" to "see how he is feeling" so as not to insinuate he is unhappy at this location. Follow up mood with PCP as PHQ-9 is elevated, though #9 negative; his social situation is likely contributing to his depression.  Janeal Holmes, MD [facility]

## 2023-01-18 NOTE — Patient Instructions (Addendum)
It was great to see you today! Here's what we talked about:  I have prescribed doxycycline to be taken twice a day for 5 days. Use warm compresses on the eye at least twice a day to help soothe the area and help any fluid come to a head, as this could be due to a swollen hair follicle or sebaceous gland. Be sure to make an appointment next week to ensure you are healing. If you develop more swelling, more pain, or any difficulties moving your eyes, please seek care sooner.  Please let me know if you have any other questions.  Dr. Phineas Real

## 2023-01-19 NOTE — Assessment & Plan Note (Signed)
Will refer to CCM for aid in finding alternative housing. Will provide facility number and ask representative to simply refer to themselves as "part of [name]'s health team" to "see how he is feeling" so as not to insinuate he is unhappy at this location. Follow up mood with PCP as PHQ-9 is elevated, though #9 negative; his social situation is likely contributing to his depression.

## 2023-01-20 NOTE — Telephone Encounter (Signed)
I have sent message to [facility] to check on the order

## 2023-02-02 ENCOUNTER — Ambulatory Visit (INDEPENDENT_AMBULATORY_CARE_PROVIDER_SITE_OTHER): Payer: Self-pay | Admitting: Family Medicine

## 2023-02-02 ENCOUNTER — Encounter: Payer: Self-pay | Admitting: Family Medicine

## 2023-02-02 DIAGNOSIS — Z13228 Encounter for screening for other metabolic disorders: Secondary | ICD-10-CM

## 2023-02-02 DIAGNOSIS — Z119 Encounter for screening for infectious and parasitic diseases, unspecified: Secondary | ICD-10-CM

## 2023-02-02 DIAGNOSIS — M7989 Other specified soft tissue disorders: Secondary | ICD-10-CM

## 2023-02-02 DIAGNOSIS — Z114 Encounter for screening for human immunodeficiency virus [HIV]: Secondary | ICD-10-CM

## 2023-02-02 DIAGNOSIS — E611 Iron deficiency: Secondary | ICD-10-CM

## 2023-02-02 DIAGNOSIS — Z111 Encounter for screening for respiratory tuberculosis: Secondary | ICD-10-CM

## 2023-02-02 DIAGNOSIS — D508 Other iron deficiency anemias: Secondary | ICD-10-CM

## 2023-02-02 DIAGNOSIS — M25473 Effusion, unspecified ankle: Secondary | ICD-10-CM

## 2023-02-02 DIAGNOSIS — H05229 Edema of unspecified orbit: Secondary | ICD-10-CM

## 2023-02-02 DIAGNOSIS — Z1159 Encounter for screening for other viral diseases: Secondary | ICD-10-CM

## 2023-02-02 DIAGNOSIS — E669 Obesity, unspecified: Secondary | ICD-10-CM

## 2023-02-02 DIAGNOSIS — Z7189 Other specified counseling: Secondary | ICD-10-CM

## 2023-02-02 DIAGNOSIS — R7309 Other abnormal glucose: Secondary | ICD-10-CM

## 2023-02-02 NOTE — Progress Notes (Signed)
    SUBJECTIVE:   CHIEF COMPLAINT / HPI:   Blood test: The patient currently resides at the [facility] and they requested infectious disease screening test as per the facility protocol.  Obesity: Here for f/u. He asked about GLP1 agent for weight loss. He is interested in this medication.  Anemia:  Here for f/u. His last Iron infusion was 10 months ago with [facility]. No other concerns.  Ankle swelling: C/O B/L ankle swelling which gets worse at the end of the day. He endorsed associated bluish purple discoloration of his ankle at the end of the day that self-resolve slowly. This has been for about a month. He also c/o a feeling of bone coldness inside out ongoing for about 6 months.  Swollen eyelid. Here for f/u. Symptoms improved significantly following A/B therapy.    PERTINENT  PMH / PSH: PMHx reviewed  OBJECTIVE:   BP 126/83   Pulse 81   Ht 5\' 11"  (1.803 m)   Wt 291 lb (132 kg)   SpO2 98%   BMI 40.59 kg/m   Physical Exam Vitals and nursing note reviewed.  Eyes:     Extraocular Movements: Extraocular movements intact.     Pupils: Pupils are equal, round, and reactive to light.     Comments: Very mild left infra-orbital swelling with no erythema. Otherwise, normal eye exam  Cardiovascular:     Rate and Rhythm: Normal rate and regular rhythm.     Heart sounds: No murmur heard. Pulmonary:     Effort: Pulmonary effort is normal. No respiratory distress.     Breath sounds: Normal breath sounds. No wheezing.  Abdominal:     General: Abdomen is flat. There is no distension.     Palpations: Abdomen is soft. There is no mass.     Tenderness: There is no abdominal tenderness.  Musculoskeletal:        General: No swelling or tenderness.     Right lower leg: No edema.     Left lower leg: No edema.     Comments: Small, scanty, faint spider veins on the medial surface of her ankle/malleoli B/L      ASSESSMENT/PLAN:   Morbid obesity (HCC) FLP,  Cmet completed today. I will reach out to the pharmacy tech regarding the best option given his uninsured status. He will f/u with [place] soon to discuss further. A1C checked today.  Iron deficiency anemia Anemia profile B checked.   Ankle swelling May be venous stasis issue. Also has spider vein which could become more prominent with prolonged standing. I discussed use of compression stockings. Reduce salt intake and elevate legs regularly when sitted. I provided him with a compression stockings prescription. F/U soon if there is no improvement.  He agreed with the plan.   Orbital swelling S/P A/B treatment for cellulitis with great improvement. No vision loss. No further treatment needed at this point.  Monitor and f/u as needed.    Additional lab requested- TB screening, HIV and Acute hepatitis panel  Janit Pagan, MD [facility]

## 2023-02-02 NOTE — Assessment & Plan Note (Signed)
S/P A/B treatment for cellulitis with great improvement. No vision loss. No further treatment needed at this point.  Monitor and f/u as needed.

## 2023-02-02 NOTE — Patient Instructions (Signed)
Obesity, Adult Obesity is having too much body fat. Being obese means that your weight is more than what is healthy for you.  BMI (body mass index) is a number that explains how much body fat you have. If you have a BMI of 30 or more, you are obese. Obesity can cause serious health problems, such as: Stroke. Coronary artery disease (CAD). Type 2 diabetes. Some types of cancer. High blood pressure (hypertension). High cholesterol. Gallbladder stones. Obesity can also contribute to: Osteoarthritis. Sleep apnea. Infertility problems. What are the causes? Eating meals each day that are high in calories, sugar, and fat. Drinking a lot of drinks that have sugar in them. Being born with genes that may make you more likely to become obese. Having a medical condition that causes obesity. Taking certain medicines. Sitting a lot (having a sedentary lifestyle). Not getting enough sleep. What increases the risk? Having a family history of obesity. Living in an area with limited access to: [place], recreation centers, or sidewalks. Healthy food choices, such as grocery stores and farmers' markets. What are the signs or symptoms? The main sign is having too much body fat. How is this treated? Treatment for this condition often includes changing your lifestyle. Treatment may include: Changing your diet. This may include making a healthy meal plan. Exercise. This may include activity that causes your heart to beat faster (aerobic exercise) and strength training. Work with your doctor to design a program that works for you. Medicine to help you lose weight. This may be used if you are not able to lose one pound a week after 6 weeks of healthy eating and more exercise. Treating conditions that cause the obesity. Surgery. Options may include gastric banding and gastric bypass. This may be done if: Other treatments have not helped to improve your condition. You have a BMI of 40 or higher. You have  life-threatening health problems related to obesity. Follow these instructions at home: Eating and drinking  Follow advice from your doctor about what to eat and drink. Your doctor may tell you to: Limit fast food, sweets, and processed snack foods. Choose low-fat options. For example, choose low-fat milk instead of whole milk. Eat five or more servings of fruits or vegetables each day. Eat at home more often. This gives you more control over what you eat. Choose healthy foods when you eat out. Learn to read food labels. This will help you learn how much food is in one serving. Keep low-fat snacks available. Avoid drinks that have a lot of sugar in them. These include soda, fruit juice, iced tea with sugar, and flavored milk. Drink enough water to keep your pee (urine) pale yellow. Do not go on fad diets. Physical activity Exercise often, as told by your doctor. Most adults should get up to 150 minutes of moderate-intensity exercise every week.Ask your doctor: What types of exercise are safe for you. How often you should exercise. Warm up and stretch before being active. Do slow stretching after being active (cool down). Rest between times of being active. Lifestyle Work with your doctor and a food expert (dietitian) to set a weight-loss goal that is best for you. Limit your screen time. Find ways to reward yourself that do not involve food. Do not drink alcohol if: Your doctor tells you not to drink. You are pregnant, may be pregnant, or are planning to become pregnant. If you drink alcohol: Limit how much you have to: 0-1 drink a day for women. 0-2 drinks  a day for men. Know how much alcohol is in your drink. In the [place] one drink equals one 12 oz bottle of beer (355 mL), one 5 oz glass of wine (148 mL), or one 1 oz glass of hard liquor (44 mL). General instructions Keep a weight-loss journal. This can help you keep track of: The food that you eat. How much exercise you  get. Take over-the-counter and prescription medicines only as told by your doctor. Take vitamins and supplements only as told by your doctor. Think about joining a support group. Pay attention to your mental health as obesity can lead to depression or self esteem issues. Keep all follow-up visits. Contact a doctor if: You cannot meet your weight-loss goal after you have changed your diet and lifestyle for 6 weeks. You are having trouble breathing. Summary Obesity is having too much body fat. Being obese means that your weight is more than what is healthy for you. Work with your doctor to set a weight-loss goal. Get regular exercise as told by your doctor. This information is not intended to replace advice given to you by your health care provider. Make sure you discuss any questions you have with your health care provider. Document Revised: 04/01/2021 Document Reviewed: 04/01/2021 Elsevier Patient Education  2024 [employer].

## 2023-02-02 NOTE — Assessment & Plan Note (Signed)
FLP, Cmet completed today. I will reach out to the pharmacy tech regarding the best option given his uninsured status. He will f/u with [place] soon to discuss further. A1C checked today.

## 2023-02-02 NOTE — Assessment & Plan Note (Signed)
Anemia profile B checked.

## 2023-02-02 NOTE — Assessment & Plan Note (Signed)
May be venous stasis issue. Also has spider vein which could become more prominent with prolonged standing. I discussed use of compression stockings. Reduce salt intake and elevate legs regularly when sitted. I provided him with a compression stockings prescription. F/U soon if there is no improvement.  He agreed with the plan.

## 2023-02-03 ENCOUNTER — Encounter: Payer: Self-pay | Admitting: Family Medicine

## 2023-02-03 ENCOUNTER — Telehealth: Payer: Self-pay | Admitting: Family Medicine

## 2023-02-03 LAB — ANEMIA PROFILE B
Eos: 11 %
Folate: 7.9 ng/mL (ref >3.0)
Hemoglobin: 11.7 g/dL — ABNORMAL LOW (ref 13.0–17.7)
Immature Grans (Abs): 0 10*3/uL (ref 0.0–0.1)
Iron Saturation: 5 % — CL (ref 15–55)
Lymphs: 11 %
MCH: 25.9 pg — ABNORMAL LOW (ref 26.6–33.0)
MCHC: 32.1 g/dL (ref 31.5–35.7)
MCV: 81 fL (ref 79–97)
Monocytes: 9 %
Neutrophils Absolute: 4.7 10*3/uL (ref 1.4–7.0)
Platelets: 303 10*3/uL (ref 150–450)
RDW: 13.3 % (ref 11.6–15.4)

## 2023-02-03 LAB — QUANTIFERON-TB GOLD PLUS

## 2023-02-03 LAB — CMP14+EGFR
Calcium: 9.6 mg/dL (ref 8.7–10.2)
Chloride: 103 mmol/L (ref 96–106)
Glucose: 84 mg/dL (ref 70–99)
eGFR: 93 mL/min/{1.73_m2} (ref >59)

## 2023-02-03 LAB — HEMOGLOBIN A1C: Hgb A1c MFr Bld: 5.8 % — ABNORMAL HIGH (ref 4.8–5.6)

## 2023-02-03 LAB — HIV ANTIBODY (ROUTINE TESTING W REFLEX): HIV Screen 4th Generation wRfx: NONREACTIVE

## 2023-02-03 LAB — LIPID PANEL
Chol/HDL Ratio: 3.5 ratio (ref 0.0–5.0)
Triglycerides: 86 mg/dL (ref 0–149)

## 2023-02-03 LAB — ACUTE HEP PANEL AND HEP B SURFACE AB: Hep C Virus Ab: NONREACTIVE

## 2023-02-03 NOTE — Telephone Encounter (Signed)
Unable to reach patient to discuss test result. Will send a MyChart message.

## 2023-02-04 ENCOUNTER — Other Ambulatory Visit: Payer: Self-pay | Admitting: Family Medicine

## 2023-02-04 DIAGNOSIS — D509 Iron deficiency anemia, unspecified: Secondary | ICD-10-CM

## 2023-02-04 LAB — ANEMIA PROFILE B
Basophils Absolute: 0.1 10*3/uL (ref 0.0–0.2)
Basos: 1 %
EOS (ABSOLUTE): 0.7 10*3/uL — ABNORMAL HIGH (ref 0.0–0.4)
Ferritin: 9 ng/mL — ABNORMAL LOW (ref 30–400)
Hematocrit: 36.5 % — ABNORMAL LOW (ref 37.5–51.0)
Immature Granulocytes: 0 %
Iron: 19 ug/dL — ABNORMAL LOW (ref 38–169)
Lymphocytes Absolute: 0.8 10*3/uL (ref 0.7–3.1)
Monocytes Absolute: 0.7 10*3/uL (ref 0.1–0.9)
Neutrophils: 68 %
RBC: 4.52 x10E6/uL (ref 4.14–5.80)
Retic Ct Pct: 1.5 % (ref 0.6–2.6)
Total Iron Binding Capacity: 362 ug/dL (ref 250–450)
UIBC: 343 ug/dL (ref 111–343)
Vitamin B-12: 427 pg/mL (ref 232–1245)
WBC: 6.9 10*3/uL (ref 3.4–10.8)

## 2023-02-04 LAB — CMP14+EGFR
ALT: 12 IU/L (ref 0–44)
AST: 19 IU/L (ref 0–40)
Albumin/Globulin Ratio: 1.8 (ref 1.2–2.2)
Albumin: 4.2 g/dL (ref 3.8–4.9)
Alkaline Phosphatase: 113 IU/L (ref 44–121)
BUN/Creatinine Ratio: 21 — ABNORMAL HIGH (ref 9–20)
BUN: 20 mg/dL (ref 6–24)
Bilirubin Total: <0.2 mg/dL (ref 0.0–1.2)
CO2: 20 mmol/L (ref 20–29)
Creatinine, Ser: 0.95 mg/dL (ref 0.76–1.27)
Globulin, Total: 2.3 g/dL (ref 1.5–4.5)
Potassium: 4.7 mmol/L (ref 3.5–5.2)
Sodium: 138 mmol/L (ref 134–144)
Total Protein: 6.5 g/dL (ref 6.0–8.5)

## 2023-02-04 LAB — QUANTIFERON-TB GOLD PLUS
QuantiFERON Mitogen Value: >10 IU/mL
QuantiFERON Nil Value: 0.01 IU/mL
QuantiFERON TB1 Ag Value: 0.01 IU/mL
QuantiFERON TB2 Ag Value: 0.01 IU/mL
QuantiFERON-TB Gold Plus: NEGATIVE

## 2023-02-04 LAB — ACUTE HEP PANEL AND HEP B SURFACE AB
Hep A IgM: NEGATIVE
Hep B C IgM: NEGATIVE
Hepatitis B Surf Ab Quant: <3.5 m[IU]/mL — ABNORMAL LOW (ref >9.9)
Hepatitis B Surface Ag: NEGATIVE

## 2023-02-04 LAB — LIPID PANEL
Cholesterol, Total: 181 mg/dL (ref 100–199)
HDL: 51 mg/dL (ref >39)
LDL Chol Calc (NIH): 114 mg/dL — ABNORMAL HIGH (ref 0–99)
VLDL Cholesterol Cal: 16 mg/dL (ref 5–40)

## 2023-02-04 LAB — HEMOGLOBIN A1C: Est. average glucose Bld gHb Est-mCnc: 120 mg/dL

## 2023-02-04 NOTE — Telephone Encounter (Signed)
I have been unable to reach him via phone call.  He does not seem to have access to his MyChart account.  I went ahead and scheduled his iron infusion for Jun 20th after his appointment with me, so I can provide him with the infusion appointment detail when he comes in to see me.

## 2023-02-09 ENCOUNTER — Ambulatory Visit (HOSPITAL_COMMUNITY): Payer: Self-pay | Admitting: Licensed Clinical Social Worker

## 2023-02-17 ENCOUNTER — Other Ambulatory Visit (HOSPITAL_COMMUNITY): Payer: Self-pay

## 2023-02-17 ENCOUNTER — Encounter: Payer: Self-pay | Admitting: Physician Assistant

## 2023-02-17 ENCOUNTER — Ambulatory Visit (INDEPENDENT_AMBULATORY_CARE_PROVIDER_SITE_OTHER): Payer: Self-pay | Admitting: Family Medicine

## 2023-02-17 ENCOUNTER — Encounter: Payer: Self-pay | Admitting: Family Medicine

## 2023-02-17 VITALS — BP 128/87 | HR 88 | Ht 71.0 in | Wt 291.2 lb

## 2023-02-17 DIAGNOSIS — J45909 Unspecified asthma, uncomplicated: Secondary | ICD-10-CM

## 2023-02-17 DIAGNOSIS — D508 Other iron deficiency anemias: Secondary | ICD-10-CM

## 2023-02-17 DIAGNOSIS — F329 Major depressive disorder, single episode, unspecified: Secondary | ICD-10-CM

## 2023-02-17 DIAGNOSIS — E785 Hyperlipidemia, unspecified: Secondary | ICD-10-CM

## 2023-02-17 MED ORDER — ALBUTEROL SULFATE HFA 108 (90 BASE) MCG/ACT IN AERS
1.0000 | INHALATION_SPRAY | Freq: Four times a day (QID) | RESPIRATORY_TRACT | 3 refills | Status: DC | PRN
  Filled 2023-02-17: qty 6.7, 25d supply, fill #0

## 2023-02-17 MED ORDER — ALBUTEROL SULFATE HFA 108 (90 BASE) MCG/ACT IN AERS
1.0000 | INHALATION_SPRAY | Freq: Four times a day (QID) | RESPIRATORY_TRACT | 3 refills | Status: DC | PRN
  Filled 2023-02-17 – 2023-06-08 (×2): qty 6.7, 25d supply, fill #0

## 2023-02-17 NOTE — Patient Instructions (Signed)
It was nice seeing you today. Your iron level is again low. You have an appointment with iron infusion center at [facility]. Please contact to reschedule your appointment, if you prefer a different date and time.

## 2023-02-17 NOTE — Assessment & Plan Note (Signed)
Unfortunately, he does not qualify for GLP1 MAP program per the pharmacy tech. I provided resources on obtaining orange card and insurance.  Continue diet and exercise for weight loss.

## 2023-02-17 NOTE — Assessment & Plan Note (Signed)
Result discussed. Moderate risk for CV disorder. Moderate intensity statin recommended. He prefers lifestyle modification. Recheck lab in 6-12 months.

## 2023-02-17 NOTE — Assessment & Plan Note (Signed)
Flowsheet Row Office Visit from 02/17/2023 in [facility]  PHQ-9 Total Score 21      No SI. I offered to initiate SSRI today. However, he prefers to defer to his psychiatrist. F/U appointment rescheduled with them.

## 2023-02-17 NOTE — Progress Notes (Signed)
    SUBJECTIVE:   CHIEF COMPLAINT / HPI:   Depression: He missed his Psych appointment due to transportation issues with his facility. His mood is down most of the time, mostly because of his living situation. He denies SI or HI.  Obesity: Here for follow-up on medication support for weight loss.  Iron deficiency Anemia: C/O worsening fatigue in the last few weeks. 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 He requested a letter of accommodation to limit the work he can do.  HLD: Here for lab follow-up.  Lab report: Her for lab f/u.  PERTINENT  PMH / PSH: PMHx reviewed  OBJECTIVE:   BP 128/87   Pulse 88   Ht 5\' 11"  (1.803 m)   Wt 291 lb 3.2 oz (132.1 kg)   SpO2 96%   BMI 40.61 kg/m   Physical Exam Vitals reviewed.  Cardiovascular:     Rate and Rhythm: Normal rate and regular rhythm.     Pulses: Normal pulses.     Heart sounds: Normal heart sounds. No murmur heard. Pulmonary:     Effort: Pulmonary effort is normal. No respiratory distress.     Breath sounds: Normal breath sounds. No wheezing.      ASSESSMENT/PLAN:   Depression Flowsheet Row Office Visit from 02/17/2023 in [facility]  PHQ-9 Total Score 21      No SI. I offered to initiate SSRI today. However, he prefers to defer to his psychiatrist. F/U appointment rescheduled with them.   Morbid obesity (HCC) Unfortunately, he does not qualify for GLP1 MAP program per the pharmacy tech. I provided resources on obtaining orange card and insurance.  Continue diet and exercise for weight loss.  Iron deficiency anemia Result reviewed and discussed with him. Information provided about his iron infusion appointment.   Hyperlipidemia Result discussed. Moderate risk for CV disorder. Moderate intensity statin recommended. He prefers lifestyle modification. Recheck lab in 6-12 months.   Lab discussed. Hepatitis B nonimmune Unable  to get vaccinated today due to lack of insurance. I requested this in writing to take to his facility to provide vaccination. Otherwise, he can obtain vaccination at his pharmacy.  Janit Pagan, MD [facility]

## 2023-02-17 NOTE — Progress Notes (Signed)
NB: It appears someone has access to his MyChart account and checked his lab report without his awareness. He used our computer here to self-change his password during this visit.

## 2023-02-17 NOTE — Assessment & Plan Note (Signed)
[employer] manager provided. Albuterol refilled.

## 2023-02-17 NOTE — Assessment & Plan Note (Signed)
Result reviewed and discussed with him. Information provided about his iron infusion appointment.

## 2023-02-18 ENCOUNTER — Other Ambulatory Visit: Payer: Self-pay | Admitting: Family Medicine

## 2023-02-18 DIAGNOSIS — Z5900 Homelessness unspecified: Secondary | ICD-10-CM

## 2023-02-23 ENCOUNTER — Ambulatory Visit (INDEPENDENT_AMBULATORY_CARE_PROVIDER_SITE_OTHER): Payer: Self-pay | Admitting: Primary Care

## 2023-02-23 ENCOUNTER — Encounter: Payer: Self-pay | Admitting: Primary Care

## 2023-02-23 VITALS — BP 112/80 | HR 77 | Temp 97.9°F | Ht 71.0 in | Wt 293.2 lb

## 2023-02-23 DIAGNOSIS — G4733 Obstructive sleep apnea (adult) (pediatric): Secondary | ICD-10-CM

## 2023-02-23 NOTE — Patient Instructions (Addendum)
Please bring CPAP machine and SD card by [employer] so that they can adjust your CPAP pressure settings and get download. You will also need to brig SD card by again in 6 weeks before your next visit   Start Trelegy one puff daily in the morning in place of Anoro  (if you prefer this to Anoro we can apply for patient assistance at next visit)  Orders: Adjust CPAP pressure 15cm h20  Please have [employer] fax download from SD card Sample Trelegy   Follow-up: 6 weeks with [facility] NP or sooner if needed (please bring SD card by [employer] for compliance download before any follow-up with [place])  CPAP and BIPAP Information CPAP and BIPAP are methods that use air pressure to keep your airways open and to help you breathe well. CPAP and BIPAP use different amounts of pressure. Your health care provider will tell you whether CPAP or BIPAP would be more helpful for you. CPAP stands for "continuous positive airway pressure." With CPAP, the amount of pressure stays the same while you breathe in (inhale) and out (exhale). BIPAP stands for "bi-level positive airway pressure." With BIPAP, the amount of pressure will be higher when you inhale and lower when you exhale. This allows you to take larger breaths. CPAP or BIPAP may be used in the hospital, or your health care provider may want you to use it at home. You may need to have a sleep study before your health care provider can order a machine for you to use at home. What are the advantages? CPAP or BIPAP can be helpful if you have: Sleep apnea. Chronic obstructive pulmonary disease (COPD). Heart failure. Medical conditions that cause muscle weakness, including muscular dystrophy or amyotrophic lateral sclerosis (ALS). Other problems that cause breathing to be shallow, weak, abnormal, or difficult. CPAP and BIPAP are most commonly used for obstructive sleep apnea (OSA) to keep the airways from collapsing when the muscles relax during sleep. What are the  risks? Generally, this is a safe treatment. However, problems may occur, including: Irritated skin or skin sores if the mask does not fit properly. Dry or stuffy nose or nosebleeds. Dry mouth. Feeling gassy or bloated. Sinus or lung infection if the equipment is not cleaned properly. When should CPAP or BIPAP be used? In most cases, the mask only needs to be worn during sleep. Generally, the mask needs to be worn throughout the night and during any daytime naps. People with certain medical conditions may also need to wear the mask at other times, such as when they are awake. Follow instructions from your health care provider about when to use the machine. What happens during CPAP or BIPAP?  Both CPAP and BIPAP are provided by a small machine with a flexible plastic tube that attaches to a plastic mask that you wear. Air is blown through the mask into your nose or mouth. The amount of pressure that is used to blow the air can be adjusted on the machine. Your health care provider will set the pressure setting and help you find the best mask for you. Tips for using the mask Because the mask needs to be snug, some people feel trapped or closed-in (claustrophobic) when first using the mask. If you feel this way, you may need to get used to the mask. One way to do this is to hold the mask loosely over your nose or mouth and then gradually apply the mask more snugly. You can also gradually increase the amount  of time that you use the mask. Masks are available in various types and sizes. If your mask does not fit well, talk with your health care provider about getting a different one. Some common types of masks include: Full face masks, which fit over the mouth and nose. Nasal masks, which fit over the nose. Nasal pillow or prong masks, which fit into the nostrils. If you are using a mask that fits over your nose and you tend to breathe through your mouth, a chin strap may be applied to help keep your  mouth closed. Use a skin barrier to protect your skin as told by your health care provider. Some CPAP and BIPAP machines have alarms that may sound if the mask comes off or develops a leak. If you have trouble with the mask, it is very important that you talk with your health care provider about finding a way to make the mask easier to tolerate. Do not stop using the mask. There could be a negative impact on your health if you stop using the mask. Tips for using the machine Place your CPAP or BIPAP machine on a secure table or stand near an electrical outlet. Know where the on/off switch is on the machine. Follow instructions from your health care provider about how to set the pressure on your machine and when you should use it. Do not eat or drink while the CPAP or BIPAP machine is on. Food or fluids could get pushed into your lungs by the pressure of the CPAP or BIPAP. For home use, CPAP and BIPAP machines can be rented or purchased through home health care companies. Many different brands of machines are available. Renting a machine before purchasing may help you find out which particular machine works well for you. Your health insurance company may also decide which machine you may get. Keep the CPAP or BIPAP machine and attachments clean. Ask your health care provider for specific instructions. Check the humidifier if you have a dry stuffy nose or nosebleeds. Make sure it is working correctly. Follow these instructions at home: Take over-the-counter and prescription medicines only as told by your health care provider. Ask if you can take sinus medicine if your sinuses are blocked. Do not use any products that contain nicotine or tobacco. These products include cigarettes, chewing tobacco, and vaping devices, such as e-cigarettes. If you need help quitting, ask your health care provider. Keep all follow-up visits. This is important. Contact a health care provider if: You have redness or pressure  sores on your head, face, mouth, or nose from the mask or head gear. You have trouble using the CPAP or BIPAP machine. You cannot tolerate wearing the CPAP or BIPAP mask. Someone tells you that you snore even when wearing your CPAP or BIPAP. Get help right away if: You have trouble breathing. You feel confused. Summary CPAP and BIPAP are methods that use air pressure to keep your airways open and to help you breathe well. If you have trouble with the mask, it is very important that you talk with your health care provider about finding a way to make the mask easier to tolerate. Do not stop using the mask. There could be a negative impact to your health if you stop using the mask. Follow instructions from your health care provider about when to use the machine. This information is not intended to replace advice given to you by your health care provider. Make sure you discuss any questions you have  with your health care provider. Document Revised: 04/02/2021 Document Reviewed: 08/02/2020 Elsevier Patient Education  2023 [employer].

## 2023-02-23 NOTE — Progress Notes (Signed)
@Patient  ID: [name], male    DOB: 07/28/1966, 57 y.o.   MRN: [id number]  Chief Complaint  Patient presents with   Follow-up    ACT 7     Referring provider: [name], MD  HPI: 57 year old male, never smoked. PMH significant for OSA, asthma, GERD, ADHD, iron deficiency anemia, morbid obesity.   Previous [facility] pulmonary encounter:  01/12/2023 Patient presents today for for overdue OSA follow-up.  He has severe sleep apnea. Unable to afford CPAP and most medications. [facility] He was able to get a used CPAP machine from a friend. He has a [employer] system one. Apply for patient assistance for Anoro.   NPSG 04/30/14- positive for moderate obstructive sleep apnea, AHI 16.9 per hour with moderate snoring and desaturation to 86%. CPAP titrated to 14 CWP  01/02/2018-sleep study- severe obstructive sleep apnea with an AHI of 57.1     OSA (obstructive sleep apnea) - Patient has severe OSA, unable to afford CPAP machine. He received a [employer] system one machine from a friend. Patient needs to bring current CPAP by [employer] to see if we are able to use machine. Pressure settings likely need to be adjusted, recommending either auto settings 5-15cm h20 or set pressure 10cm h20 (we can re-adjust after as needed). He will need mask fitting and needs to be provided SD card. Advised patient aim to wear CPAP nightly for 4-6 hours or longer. FU in 6 weeks or sooner if needed.    Asthma - Stable; No acute respiratory symptoms. Patient is applying for PA for Anoro, difficulty affording medication. Unfortunately we did not have samples of ANORO today so he was provided a sample of Trelegy until he can get medication through patient assistance.     02/23/2023- Interim hx  Patient presents today for follow-up.  Patient has a history of severe obstructive sleep apnea.  He is homeless, [facility]  He was  able to get a CPAP machine from a friend.  During her last visit advised patient bring current CPAP machine by medical supply store to see if were able to use machine, pressure settings likely needed to be adjusted and device registered to him along with having mask fitting.  He is wearing CPAP nightly. He feels pressure is too low. He has not seen a clinical benefit from use. He remains extremely tired. In the past during titration studies he did notice benefit from use. 2014 titration optimal pressure 14cm. He has received CPAP supplies from [employer]. SD card provided, our office does not have a reader to access compliance report- he will need to bring by [employer] for download.   He is receiving Anoro from patient assistance. Feels it is not working well. Having more difficulty taking a deep breath. Denies overt wheezing or chest tightness.   No Known Allergies  Immunization History  Administered Date(s) Administered   PFIZER(Purple Top)SARS-COV-2 Vaccination 04/19/2020   Tdap 11/08/2013    Past Medical History:  Diagnosis Date   ADD (attention deficit disorder)    ADD (attention deficit disorder)    Allergy    Anemia, iron deficiency    Anxiety    Asthma    Blood transfusion    Constipation    Depression    Depression    Exertional dyspnea    secondary to anemia   GERD (gastroesophageal reflux disease)    Hiatal hernia  Narcolepsy    pt also reports episodes of passing out that are unrelated to narcolepsy about a year ago   Sleep apnea 01/2018   has a CPAP, "unable to use it"    Tobacco History: Social History   Tobacco Use  Smoking Status Never  Smokeless Tobacco Never   Counseling given: Not Answered   Outpatient Medications Prior to Visit  Medication Sig Dispense Refill   albuterol (VENTOLIN HFA) 108 (90 Base) MCG/ACT inhaler Inhale 1-2 puffs into the lungs every 6 (six) hours as needed for wheezing or shortness of breath. 6.7 g 3   pantoprazole (PROTONIX) 40 MG  tablet Take 1 tablet (40 mg total) by mouth daily. 90 tablet 1   umeclidinium-vilanterol (ANORO ELLIPTA) 62.5-25 MCG/ACT AEPB Inhale 1 puff into the lungs daily. 90 each 4   No facility-administered medications prior to visit.   Review of Systems  Review of Systems  Constitutional:  Positive for fatigue.  HENT: Negative.    Respiratory:  Positive for shortness of breath. Negative for cough, chest tightness and wheezing.   Cardiovascular: Negative.    Physical Exam  BP 112/80 (BP Location: Left Arm, Patient Position: Sitting, Cuff Size: Large)   Pulse 77   Temp 97.9 F (36.6 C) (Temporal)   Ht 5\' 11"  (1.803 m)   Wt 293 lb 3.2 oz (133 kg)   SpO2 97%   BMI 40.89 kg/m  Physical Exam Constitutional:      Appearance: Normal appearance.  HENT:     Head: Normocephalic and atraumatic.     Mouth/Throat:     Mouth: Mucous membranes are moist.     Pharynx: Oropharynx is clear.  Cardiovascular:     Rate and Rhythm: Normal rate and regular rhythm.  Pulmonary:     Effort: Pulmonary effort is normal.     Breath sounds: Normal breath sounds.  Musculoskeletal:        General: Normal range of motion.  Skin:    General: Skin is warm and dry.  Neurological:     General: No focal deficit present.     Mental Status: He is alert and oriented to person, place, and time. Mental status is at baseline.  Psychiatric:        Mood and Affect: Mood normal.        Behavior: Behavior normal.        Thought Content: Thought content normal.        Judgment: Judgment normal.      Lab Results:  CBC    Component Value Date/Time   WBC 6.9 02/02/2023 1708   WBC 10.4 03/19/2022 1037   WBC 10.7 (H) 01/29/2022 1012   RBC 4.52 02/02/2023 1708   RBC 5.16 03/19/2022 1037   RBC 5.23 03/19/2022 1037   HGB 11.7 (L) 02/02/2023 1708   HCT 36.5 (L) 02/02/2023 1708   PLT 303 02/02/2023 1708   MCV 81 02/02/2023 1708   MCH 25.9 (L) 02/02/2023 1708   MCH 26.2 03/19/2022 1037   MCHC 32.1 02/02/2023 1708    MCHC 32.7 03/19/2022 1037   RDW 13.3 02/02/2023 1708   LYMPHSABS 0.8 02/02/2023 1708   MONOABS 0.6 03/19/2022 1037   EOSABS 0.7 (H) 02/02/2023 1708   BASOSABS 0.1 02/02/2023 1708    BMET    Component Value Date/Time   NA 138 02/02/2023 1708   K 4.7 02/02/2023 1708   CL 103 02/02/2023 1708   CO2 20 02/02/2023 1708   GLUCOSE 84 02/02/2023 1708  GLUCOSE 104 (H) 03/19/2022 1037   BUN 20 02/02/2023 1708   CREATININE 0.95 02/02/2023 1708   CREATININE 1.05 03/19/2022 1037   CREATININE 1.26 05/29/2014 0847   CALCIUM 9.6 02/02/2023 1708   GFRNONAA >60 03/19/2022 1037   GFRNONAA 67 05/29/2014 0847   GFRAA 94 09/14/2017 1157   GFRAA 77 05/29/2014 0847    BNP No results found for: "BNP"  ProBNP No results found for: "PROBNP"  Imaging: No results found.   Assessment & Plan:   OSA (obstructive sleep apnea) - Severe OSA >> AHI 57.1/hour. Unable to afford CPAP, received used machine. Current pressure 10cm h20 (no ability to do auto settings). Feels pressure is not strong enough, still experiencing excessive daytime fatigue. We will increase pressure 15cm h20. He is established with [employer] for CPAP supplies. He will need to bring SD card by DME for download. FU in 4-6 weeks or sooner if needed.   Asthma - Patient reports dyspnea with exertion/ difficulty taking deep breath. Weight and deconditioning contributing to symptoms. PFT suggestive of mild asthma with air trapping and increased DLCO.  Currently on Anoro Ellipta/ receiving patient assistance. Breo caused weight gain, he saw some clinical benefit from trial of Trelegy . We will give additional sample and if needed at follow-up can apply for PA.    Glenford Bayley, NP 02/23/2023

## 2023-02-23 NOTE — Assessment & Plan Note (Signed)
-   Severe OSA >> AHI 57.1/hour. Unable to afford CPAP, received used machine. Current pressure 10cm h20 (no ability to do auto settings). Feels pressure is not strong enough, still experiencing excessive daytime fatigue. We will increase pressure 15cm h20. He is established with [employer] for CPAP supplies. He will need to bring SD card by DME for download. FU in 4-6 weeks or sooner if needed.

## 2023-02-23 NOTE — Assessment & Plan Note (Addendum)
-   Patient reports dyspnea with exertion/ difficulty taking deep breath. Weight and deconditioning contributing to symptoms. PFT suggestive of mild asthma with air trapping and increased DLCO.  Currently on Anoro Ellipta/ receiving patient assistance. Breo caused weight gain, he saw some clinical benefit from trial of Trelegy . We will give additional sample and if needed at follow-up can apply for PA.

## 2023-02-25 ENCOUNTER — Encounter (HOSPITAL_COMMUNITY): Payer: Self-pay

## 2023-03-12 ENCOUNTER — Other Ambulatory Visit: Payer: Self-pay

## 2023-03-12 ENCOUNTER — Telehealth: Payer: Self-pay | Admitting: Physician Assistant

## 2023-03-12 ENCOUNTER — Encounter: Payer: Self-pay | Admitting: Family Medicine

## 2023-03-12 ENCOUNTER — Ambulatory Visit (INDEPENDENT_AMBULATORY_CARE_PROVIDER_SITE_OTHER): Payer: Self-pay | Admitting: Family Medicine

## 2023-03-12 VITALS — BP 135/75 | HR 88 | Ht 71.0 in | Wt 294.8 lb

## 2023-03-12 DIAGNOSIS — D509 Iron deficiency anemia, unspecified: Secondary | ICD-10-CM

## 2023-03-12 DIAGNOSIS — M25473 Effusion, unspecified ankle: Secondary | ICD-10-CM

## 2023-03-12 DIAGNOSIS — R202 Paresthesia of skin: Secondary | ICD-10-CM | POA: Insufficient documentation

## 2023-03-12 NOTE — Progress Notes (Signed)
    SUBJECTIVE:   CHIEF COMPLAINT / HPI:   Anemia: He missed his iron infusion appointment. He wonders why this is not resolving despite multiple iron infusions.  Tingling sensation: C/O intermittent tingling sensation of his LL, more on the right. No other concerns.  Leg edema: Worse with any form of standing. Sometimes standing for 10-15 mins alone can aggravate the swelling. He endorsed an associated ankle pain. Swelling is worse on his right compared to the left.  PERTINENT  PMH / PSH: PMHx reviewed  OBJECTIVE:   BP 135/75   Pulse 88   Ht 5\' 11"  (1.803 m)   Wt 294 lb 12.8 oz (133.7 kg)   SpO2 97%   BMI 41.12 kg/m   Physical Exam Vitals and nursing note reviewed.  Cardiovascular:     Rate and Rhythm: Normal rate and regular rhythm.     Heart sounds: Normal heart sounds. No murmur heard. Pulmonary:     Effort: Pulmonary effort is normal. No respiratory distress.     Breath sounds: Normal breath sounds. No wheezing.  Musculoskeletal:     Right lower leg: No edema.     Left lower leg: No edema.     Comments: Mild B/L ankle tenderness, no swelling or erythema. No calf tenderness, swelling or erythema  Neurological:     Cranial Nerves: No cranial nerve deficit.     Sensory: Sensation is intact.     Gait: Gait is intact.      ASSESSMENT/PLAN:   Iron deficiency anemia I offered to help reschedule his iron infusion appointment. He, however, declined scheduling. He wishes to find an answer for his recurrent iron loss. This may be due to malabsorption vs. bone marrow disorder Oral supplements had not worked for him in the past He had seen a hematologist in the past, and he prefers to get reconnected prior to additional iron infusion I placed a Referral order, and I provided a telephone number to contact the cancer center for his appointment scheduling.  Paresthesia ?? Related to anemia Vitamin B12 and TSH levels were normal May consider repeat lab in the future  if symptoms persists Trial OTC iron supplement to treat anemia for now  Ankle swelling Normal exam Likely venous stasis He has yet to obtain compression stockings In addition to compression stockings, I recommended intermittent LL elevation throughout the day Recent ECHO reviewed and is reassuring Monitor for now       Janit Pagan, MD [facility]

## 2023-03-12 NOTE — Patient Outreach (Signed)
Medicaid Managed Care Social Work Note  03/12/2023 Name:  [name] MRN:  [id number] DOB:  [birth year]  [name] is an 57 y.o. year old male who is a primary patient of [name], MD.  The Medicaid Managed Care Coordination team was consulted for assistance with:  Community Resources   Mr. [name] was given information about Medicaid Managed Care Coordination team services today. [name] Patient agreed to services and verbal consent obtained.  Engaged with patient  for by telephone forinitial visit in response to referral for case management and/or care coordination services.   Assessments/Interventions:  Review of past medical history, allergies, medications, health status, including review of consultants reports, laboratory and other test data, was performed as part of comprehensive evaluation and provision of chronic care management services.  SDOH: (Social Determinant of Health) assessments and interventions performed: SDOH Interventions    Flowsheet Row Chronic Care Management from 02/13/2022 in [facility] Office Visit from 12/19/2021 in [facility] Chronic Care Management from 05/06/2021 in [facility] Office Visit from 09/14/2017 in [facility]  SDOH Interventions      Food Insecurity Interventions Other (Comment)  [pt already connected with FNS,  is not interested in other resources] -- -- --  Housing Interventions Patient Refused  [pt is not interested in a shelter] -- -- --  Transportation Interventions -- -- [employer] --  Depression Interventions/Treatment  -- Referral to Psychiatry -- Currently on Treatment  Financial Strain Interventions -- -- [id number] Referral --     BSW completed a visit with patient in office. BSW and patient discussed patients current situation and starting the process over for disability. [name] is currently living in a  drug/alcohol facility. He did apply for disability, but was denied. BSW provided patient with the information for Disability Advocacy center to get assistance with reapplying.  Advanced Directives Status:  Not addressed in this encounter.  Care Plan                 No Known Allergies  Medications Reviewed Today     Reviewed by Lanna Poche, CMA (Certified Medical Assistant) on 02/23/23 at 0901  Med List Status: <None>   Medication Order Taking? Sig Documenting Provider Last Dose Status Informant  albuterol (VENTOLIN HFA) 108 (90 Base) MCG/ACT inhaler [id number] Yes Inhale 1-2 puffs into the lungs every 6 (six) hours as needed for wheezing or shortness of breath. [name], MD Taking Active   pantoprazole (PROTONIX) 40 MG tablet [id number] Yes Take 1 tablet (40 mg total) by mouth daily. [name], MD Taking Active   umeclidinium-vilanterol [facility] ELLIPTA) 62.5-25 MCG/ACT AEPB [id number] Yes Inhale 1 puff into the lungs daily. [name], MD Taking Active             Patient Active Problem List   Diagnosis Date Noted   Hyperlipidemia 02/17/2023   Ankle swelling 02/02/2023   Orbital swelling 02/02/2023   Social problem 12/18/2022   Elevated BP without diagnosis of hypertension 02/10/2022   Asthma 12/19/2021   Renal cyst 12/17/2021   Homelessness 03/14/2021   Morbid obesity (HCC) 03/14/2021   Amnesia 02/09/2020   Hypogonadism in male 09/14/2017   OSA (obstructive sleep apnea) 05/29/2014   Iron deficiency anemia 12/22/2013   Chronic fatigue 07/07/2013   Adult ADHD 07/07/2013   Depression 01/07/2012   GERD (gastroesophageal reflux disease) 01/07/2012    Conditions to  be addressed/monitored per PCP order:   disability assistance  There are no care plans that you recently modified to display for this patient.   Follow up:  Patient agrees to Care Plan and Follow-up.  Plan: The Managed Medicaid care management team will reach out to the patient again  over the next 30-45 days.  Date/time of next scheduled Social Work care management/care coordination outreach:  04/23/23  [name], [name], [facility]  Managed [facility] Social Worker [phone]

## 2023-03-12 NOTE — Assessment & Plan Note (Addendum)
Normal exam Likely venous stasis He has yet to obtain compression stockings In addition to compression stockings, I recommended intermittent LL elevation throughout the day Recent ECHO reviewed and is reassuring Monitor for now

## 2023-03-12 NOTE — Assessment & Plan Note (Signed)
??   Related to anemia Vitamin B12 and TSH levels were normal May consider repeat lab in the future if symptoms persists Trial OTC iron supplement to treat anemia for now

## 2023-03-12 NOTE — Patient Instructions (Signed)
Edema  Edema is when you have too much fluid in your body or under your skin. Edema may make your legs, feet, and ankles swell. Swelling often happens in looser tissues, such as around your eyes. This is a common condition. It gets more common as you get older. There are many possible causes of edema. These include: Eating too much salt (sodium). Being on your feet or sitting for a long time. Certain medical conditions, such as: Pregnancy. Heart failure. Liver disease. Kidney disease. Cancer. Hot weather may make edema worse. Edema is usually painless. Your skin may look swollen or shiny. Follow these instructions at home: Medicines Take over-the-counter and prescription medicines only as told by your doctor. Your doctor may prescribe a medicine to help your body get rid of extra water (diuretic). Take this medicine if you are told to take it. Eating and drinking Eat a low-salt (low-sodium) diet as told by your doctor. Sometimes, eating less salt may reduce swelling. Depending on the cause of your swelling, you may need to limit how much fluid you drink (fluid restriction). General instructions Raise the injured area above the level of your heart while you are sitting or lying down. Do not sit still or stand for a long time. Do not wear tight clothes. Do not wear garters on your upper legs. Exercise your legs. This can help the swelling go down. Wear compression stockings as told by your doctor. It is important that these are the right size. These should be prescribed by your doctor to prevent possible injuries. If elastic bandages or wraps are recommended, use them as told by your doctor. Contact a doctor if: Treatment is not working. You have heart, liver, or kidney disease and have symptoms of edema. You have sudden and unexplained weight gain. Get help right away if: You have shortness of breath or chest pain. You cannot breathe when you lie down. You have pain, redness, or  warmth in the swollen areas. You have heart, liver, or kidney disease and get edema all of a sudden. You have a fever and your symptoms get worse all of a sudden. These symptoms may be an emergency. Get help right away. Call 911. Do not wait to see if the symptoms will go away. Do not drive yourself to the hospital. Summary Edema is when you have too much fluid in your body or under your skin. Edema may make your legs, feet, and ankles swell. Swelling often happens in looser tissues, such as around your eyes. Raise the injured area above the level of your heart while you are sitting or lying down. Follow your doctor's instructions about diet and how much fluid you can drink. This information is not intended to replace advice given to you by your health care provider. Make sure you discuss any questions you have with your health care provider. Document Revised: 04/28/2021 Document Reviewed: 04/28/2021 Elsevier Patient Education  2024 Elsevier Inc.  

## 2023-03-12 NOTE — Telephone Encounter (Signed)
Unable to contact directly due to phone number being disconnected; was able to leave a message regarding appointments with patient contact.

## 2023-03-12 NOTE — Assessment & Plan Note (Signed)
I offered to help reschedule his iron infusion appointment. He, however, declined scheduling. He wishes to find an answer for his recurrent iron loss. This may be due to malabsorption vs. bone marrow disorder Oral supplements had not worked for him in the past He had seen a hematologist in the past, and he prefers to get reconnected prior to additional iron infusion I placed a Referral order, and I provided a telephone number to contact the cancer center for his appointment scheduling.

## 2023-03-12 NOTE — Patient Instructions (Signed)
Visit Information  The Patient                                              was given information about Medicaid Managed Care team care coordination services and consented to engagement with the [name] Managed Care team.   Social Worker will follow up in 30-45 days.   Abelino Derrick, MHA [facility]  Managed [facility] Social Worker [phone]

## 2023-03-17 ENCOUNTER — Telehealth: Payer: Self-pay | Admitting: Physician Assistant

## 2023-03-17 NOTE — Telephone Encounter (Signed)
Rescheduled appointments per patients request via incoming call. Patient is aware of the changes made to his upcoming appointments.

## 2023-03-30 NOTE — Patient Outreach (Signed)
BSW received an email from patient stating he did go to the Disability Advocacy Center and to the [facility]. A referral to the [facility] is needed for patient to get assistance. BSW completed referral and sent to the [facility] via email.   Abelino Derrick, MHA [facility]  Managed [facility] Social Worker [phone]

## 2023-03-31 ENCOUNTER — Ambulatory Visit (INDEPENDENT_AMBULATORY_CARE_PROVIDER_SITE_OTHER): Payer: No Payment, Other | Admitting: Licensed Clinical Social Worker

## 2023-03-31 ENCOUNTER — Encounter (HOSPITAL_COMMUNITY): Payer: Self-pay

## 2023-03-31 DIAGNOSIS — F411 Generalized anxiety disorder: Secondary | ICD-10-CM | POA: Diagnosis not present

## 2023-03-31 DIAGNOSIS — F332 Major depressive disorder, recurrent severe without psychotic features: Secondary | ICD-10-CM | POA: Diagnosis not present

## 2023-03-31 NOTE — Progress Notes (Signed)
Comprehensive Clinical Assessment (CCA) Note  03/31/2023 [name] [id number]  Chief Complaint:  Chief Complaint  Patient presents with   Anxiety   Depression   Visit Diagnosis: MDD and GAD    Client is a 57 year old male.  Client is referred by PCP for a Depression and anxiety.   Client states mental health symptoms as evidenced by:   Depression Difficulty Concentrating; Hopelessness; Irritability; Sleep (too much or little); Weight gain/loss; Worthlessness; Tearfulness Difficulty Concentrating; Hopelessness; Irritability; Sleep (too much or little); Weight gain/loss; Worthlessness; Tearfulness  Duration of Depressive Symptoms Greater than two weeks Greater than two weeks  Mania None None  Anxiety Tension; Worrying; Restlessness Tension; Worrying; Restlessness  Psychosis None None  Obsessions None None  Compulsions None None  Inattention Disorganized; Forgetful; Poor follow-through on tasks; Symptoms before age 43 Disorganized; Forgetful; Poor follow-through on tasks; Symptoms before age 104  Hyperactivity/Impulsivity None None  Oppositional/Defiant Behaviors None None  Emotional Irregularity Chronic feelings of emptiness Chronic feelings of emptiness    Client denies suicidal and homicidal ideations at this time  Client denies hallucinations and delusions at this time  Client was screened for the following SDOH: Financials, food, exercise, stress\tension, social interaction, depression, and housing.   Assessment Information that integrates subjective and objective details with a therapist's professional interpretation:   Patient was alert and oriented x 5.  Patient was pleasant, cooperative, maintained good eye contact.  He engaged well in therapy session and was dressed casually.  Today patient stated "I am a little confused and I am here because you are not a psychiatrist or psychologist".  LCSW explained process of getting a comprehensive clinical assessment completed by  an LCSW or a licensed [place].  LCSW stated in order to go to medication management for meet qualifications for individual therapy comprehensive clinical assessment needs to be completed.  [name] was agreeable to complete comprehensive clinical assessment.  [name] comes in today with history of depression and anxiety.  Patient reports that he is homeless and is currently working with [facility].  Patient reports that he does not belong there as most of the residents at the facility are drug users.  Patient reports that it does help to have housing access and has him practicing his faith.  Patient reports that he is unaware of what he is going to do next after the program is completed in about 100 days.   LCSW made referral to Medicaid DSS worker at [facility] to follow-up for Medicaid as patient reports not having Medicaid and not working in the past 2 years.  Patient reports that he has been applying to Social Security disability and about 1-1/2 years ago was denied.  Patient reports that he is resubmitting an application to appeal the process.   Patient reports limited friends and family in the area and no one that he talks to outside of the area.  Patient reports that he is unable to work due to sleep apnea, anemia, and vitamin D deficiency.  Patient reports he is not taking any mental health medications at this time.  [name] denies any suicidal or homicidal ideations.  Patient was agreeable to therapy and medication management referral at [facility].  Patient will start therapy with this LCSW in 3 weeks.  Client states use of the following substances: none today        Clinician assisted client with scheduling the following appointments:8/14 @ 9am   . Clinician provided  information on format of appointment ( face to face).    Client was in agreement with treatment recommendations.   CCA Screening, Triage and Referral  (STR)  Patient Reported Information Referral name: Family Medicine    Whom do you see for routine medical problems? Primary Care  Practice/Facility Name: [name], MD   What Is the Reason for Your Visit/Call Today? No data recorded How Long Has This Been Causing You Problems? > than 6 months  What Do You Feel Would Help You the Most Today? Treatment for Depression or other mood problem; Stress Management; Medication(s)   Have You Recently Been in Any Inpatient Treatment (Hospital/Detox/Crisis Center/28-Day Program)? No   Have You Ever Received Services From [employer] [facility] Before? Yes  Who Do You See at [facility]? PCP   Have You Recently Had Any Thoughts About Hurting Yourself? No  Are You Planning to Commit Suicide/Harm Yourself At This time? No   Have you Recently Had Thoughts About Hurting Someone Karolee Ohs? No  Have You Used Any Alcohol or Drugs in the Past 24 Hours? No  How Long Ago Did You Use Drugs or Alcohol? No data recorded What Did You Use and How Much? No data recorded  Do You Currently Have a Therapist/Psychiatrist? No  Name of Therapist/Psychiatrist: No data recorded  Have You Been Recently Discharged From Any Office Practice or Programs? No  Explanation of Discharge From Practice/Program: No data recorded    CCA Screening Triage Referral Assessment Type of Contact: Face-to-Face  Is this Initial or Reassessment? No data recorded Date Telepsych consult ordered in [facility]:  No data recorded Time Telepsych consult ordered in [facility]:  No data recorded  Patient Reported Information Reviewed? No data recorded Patient Left Without Being Seen? No data recorded Reason for Not Completing Assessment: No data recorded    Is CPS involved or ever been involved? Never  Is APS involved or ever been involved? Never   Patient Determined To Be At Risk for Harm To Self or Others Based on Review of Patient Reported Information or Presenting Complaint?  No  Method: No Plan  Availability of Means: No access or NA  Intent: Vague intent or NA  Notification Required: No need or identified person  Are There Guns or Other Weapons in Your Home? No  Location of Assessment: [facility]   [place] of Residence: [place]   Options For Referral: Medication Management   CCA Biopsychosocial Intake/Chief Complaint:  Depression and anxiety due to homelessness. Pt reports that he is [employer] but reports it is more for housing reasons. Most people there are there because of drug addictions. He reports that he has no income currently due to physical illness for Iron deficiency, anemia, asthma, sleep apnea.  Current Symptoms/Problems: fatigue, tired, weakness, weight gain, difficulty Concentration, tearfulness,  tension, and worry.   Patient Reported Schizophrenia/Schizoaffective Diagnosis in Past: No   Strengths: willing to engage in mental health services.  Preferences: therapy and medicaition magnt  Abilities: none reproted   Type of Services Patient Feels are Needed: therapy and medication mgnt   Initial Clinical Notes/Concerns: fatigue   Mental Health Symptoms Depression:   Difficulty Concentrating; Hopelessness; Irritability; Sleep (too much or little); Weight gain/loss; Worthlessness; Tearfulness   Duration of Depressive symptoms:  Greater than two weeks   Mania:   None   Anxiety:    Tension; Worrying; Restlessness   Psychosis:   None   Duration of Psychotic symptoms: No data recorded  Trauma:  No data recorded  Obsessions:   None   Compulsions:   None   Inattention:   Disorganized; Forgetful; Poor follow-through on tasks; Symptoms before age 37   Hyperactivity/Impulsivity:   None   Oppositional/Defiant Behaviors:   None   Emotional Irregularity:   Chronic feelings of emptiness   Other Mood/Personality Symptoms:  No data recorded   Mental Status Exam Appearance  and self-care  Stature:   Average   Weight:   Obese   Clothing:   Casual   Grooming:   Normal   Cosmetic use:   None   Posture/gait:   Normal   Motor activity:   Not Remarkable   Sensorium  Attention:   Normal   Concentration:   Normal   Orientation:   X5   Recall/memory:   Normal   Affect and Mood  Affect:   Anxious; Depressed   Mood:   Anxious; Depressed   Relating  Eye contact:   Normal   Facial expression:   Anxious; Responsive; Depressed   Attitude toward examiner:   Cooperative   Thought and Language  Speech flow:  Clear and Coherent   Thought content:   Appropriate to Mood and Circumstances   Preoccupation:   None   Hallucinations:   None   Organization:  No data recorded  [employer] of Knowledge:   Fair   Intelligence:   Average   Abstraction:   Functional   Judgement:   Fair   Dance movement psychotherapist:   Realistic   Insight:   Good; Fair   Decision Making:   Normal   Social Functioning  Social Maturity:   Isolates   Social Judgement:   Normal   Stress  Stressors:   Housing; Surveyor, quantity; Work; Illness   Coping Ability:   Exhausted; Overwhelmed   Skill Deficits:   None   Supports:   Church     Religion: Religion/Spirituality Are You A Religious Person?: Yes What is Your Religious Affiliation?: Methodist  Leisure/Recreation: Leisure / Recreation Do You Have Hobbies?: Yes Leisure and Hobbies: none reported  Exercise/Diet: Exercise/Diet Do You Exercise?: No Have You Gained or Lost A Significant Amount of Weight in the Past Six Months?: Yes-Gained Number of Pounds Gained: 12 Do You Follow a Special Diet?: No Do You Have Any Trouble Sleeping?: No (pt reports over sleeping)   CCA Employment/Education Employment/Work Situation: Employment / Work Situation Employment Situation: Unemployed Patient's Job has Been Impacted by Current Illness: Yes Describe how Patient's Job has Been  Impacted: Pt reprots calling off due to fatigue What is the Longest Time Patient has Held a Job?: 10 years 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?: No  Education: Education Is Patient Currently Attending School?: No Last Grade Completed: 12 Did Garment/textile technologist From [employer]?: Yes Did Theme park manager?: Yes What Type of College Degree Do you Have?: pt reports close but did not gradaute Did You Attend Graduate School?: No Did You Have An Individualized Education Program (IIEP): No Did You Have Any Difficulty At School?: No Patient's Education Has Been Impacted by Current Illness: No   CCA Family/Childhood History Family and Relationship History: Family history Marital status: Single Are you sexually active?: No What is your sexual orientation?: hetrosexual Has your sexual activity been affected by drugs, alcohol, medication, or emotional stress?: none reported Does patient have children?: No  Childhood History:  Childhood History By whom was/is the patient raised?:  Both parents Description of patient's relationship with caregiver when they were a child: "distant" Patient's description of current relationship with people who raised him/her: Father: Deceased and Mother: Not acitive in pt life. Does patient have siblings?: Yes Number of Siblings: 2 Description of patient's current relationship with siblings: distant Did patient suffer any verbal/emotional/physical/sexual abuse as a child?: No Did patient suffer from severe childhood neglect?: No Has patient ever been sexually abused/assaulted/raped as an adolescent or adult?: No Was the patient ever a victim of a crime or a disaster?: No Witnessed domestic violence?: No Has patient been affected by domestic violence as an adult?: No  Child/Adolescent Assessment:     CCA Substance Use Alcohol/Drug Use: Alcohol / Drug Use History of alcohol / drug use?: No history of  alcohol / drug abuse                         ASAM's:  Six Dimensions of Multidimensional Assessment  Dimension 1:  Acute Intoxication and/or Withdrawal Potential:      Dimension 2:  Biomedical Conditions and Complications:      Dimension 3:  Emotional, Behavioral, or Cognitive Conditions and Complications:     Dimension 4:  Readiness to Change:     Dimension 5:  Relapse, Continued use, or Continued Problem Potential:     Dimension 6:  Recovery/Living Environment:     ASAM Severity Score:    ASAM Recommended Level of Treatment:     Substance use Disorder (SUD)    Recommendations for Services/Supports/Treatments:    DSM5 Diagnoses: Patient Active Problem List   Diagnosis Date Noted   GAD (generalized anxiety disorder) 03/31/2023   Paresthesia 03/12/2023   Hyperlipidemia 02/17/2023   Ankle swelling 02/02/2023   Orbital swelling 02/02/2023   Social problem 12/18/2022   Elevated BP without diagnosis of hypertension 02/10/2022   Asthma 12/19/2021   Renal cyst 12/17/2021   Homelessness 03/14/2021   Morbid obesity (HCC) 03/14/2021   Amnesia 02/09/2020   Hypogonadism in male 09/14/2017   OSA (obstructive sleep apnea) 05/29/2014   Iron deficiency anemia 12/22/2013   Chronic fatigue 07/07/2013   Adult ADHD 07/07/2013   Depression 01/07/2012   GERD (gastroesophageal reflux disease) 01/07/2012    Referrals to Alternative Service(s): Referred to Alternative Service(s):   Place:   Date:   Time:    Referred to Alternative Service(s):   Place:   Date:   Time:    Referred to Alternative Service(s):   Place:   Date:   Time:    Referred to Alternative Service(s):   Place:   Date:   Time:      Collaboration of Care: Other medication management team and individual therapy at [facility]  Patient/Guardian was advised Release of Information must be obtained prior to any record release in order to collaborate their care with an outside provider.  Patient/Guardian was advised if they have not already done so to contact the registration department to sign all necessary forms in order for [place] to release information regarding their care.   Consent: Patient/Guardian gives verbal consent for treatment and assignment of benefits for services provided during this visit. Patient/Guardian expressed understanding and agreed to proceed.   Weber Cooks, LCSW

## 2023-04-05 ENCOUNTER — Encounter: Payer: Self-pay | Admitting: Physician Assistant

## 2023-04-07 ENCOUNTER — Other Ambulatory Visit (HOSPITAL_COMMUNITY): Payer: Self-pay

## 2023-04-07 ENCOUNTER — Encounter: Payer: Self-pay | Admitting: Primary Care

## 2023-04-07 ENCOUNTER — Ambulatory Visit (INDEPENDENT_AMBULATORY_CARE_PROVIDER_SITE_OTHER): Payer: Self-pay | Admitting: Primary Care

## 2023-04-07 ENCOUNTER — Encounter: Payer: Self-pay | Admitting: Physician Assistant

## 2023-04-07 VITALS — BP 118/78 | HR 86 | Temp 98.8°F | Ht 71.0 in | Wt 293.2 lb

## 2023-04-07 DIAGNOSIS — G4733 Obstructive sleep apnea (adult) (pediatric): Secondary | ICD-10-CM

## 2023-04-07 DIAGNOSIS — G4710 Hypersomnia, unspecified: Secondary | ICD-10-CM

## 2023-04-07 MED ORDER — ANORO ELLIPTA 62.5-25 MCG/ACT IN AEPB: 1.0000 | INHALATION_SPRAY | Freq: Every day | RESPIRATORY_TRACT | 4 refills | Status: DC

## 2023-04-07 MED ORDER — ANORO ELLIPTA 62.5-25 MCG/ACT IN AEPB
1.0000 | INHALATION_SPRAY | Freq: Every day | RESPIRATORY_TRACT | 4 refills | Status: DC
  Filled 2023-04-07 – 2023-09-20 (×2): qty 60, 30d supply, fill #0
  Filled 2023-11-04: qty 60, 30d supply, fill #1
  Filled 2023-11-29: qty 60, 30d supply, fill #2
  Filled 2024-01-03: qty 60, 30d supply, fill #3
  Filled 2024-02-03: qty 60, 30d supply, fill #4

## 2023-04-07 NOTE — Assessment & Plan Note (Addendum)
-   PSG 01/03/23 >> severe OSA, AHI 57.1/hour. Patient is homeless/lives in [facility]. He was unable to afford CPAP. Received a used [employer] One CPAP from a third party. He reports compliance with use, only getting about 2-3 hours of usage. Continues to report excessive daytime sleepiness along with some narcolepsy symptoms. Current pressure 15cm h20. Getting compliance download has been a challenge, he physically needs to bring machine by [facility] in [place] for data to be pulled.  We will order CPAP titration study along with MSLT due to persistent daytime sleepiness. Advised patient aim to wear CPAP nightly 4-6 hours or longer. DME company is [employer]. Recommend patient establish with Dr. Maple Hudson to discuss medications for hypersomnia after testing has been completed (unclear if he can take scheduled II mediations while in program)

## 2023-04-07 NOTE — Assessment & Plan Note (Addendum)
-   Stable; No acute respiratory symptoms. PFTs suggestive of mild asthma with air trapping and increased DLCO. Breo caused weight gain. He was switched to LABA/LAMA in JUNE 2023. Receiving Anoro through patient assistance. He is currently out of medication and needs new prescription. Unfortunately we did not have samples of ANORO today so he was provided a sample of Trelegy until he can get medication through patient assistance.

## 2023-04-07 NOTE — Progress Notes (Signed)
@Patient  ID: [name], male    DOB: 10/11/1965, 57 y.o.   MRN: [id number]  Chief Complaint  Patient presents with   Follow-up    Wearing CPAP-wears 2-3 hrs. Each night and then not able to go back asleep,increased sob,denies cough and wheeze    Referring provider: [name], MD  HPI: 57 year old male, never smoked. PMH significant for OSA, asthma, GERD, ADHD, iron deficiency anemia, morbid obesity.   Previous [facility] pulmonary encounter:  01/12/2023 Patient presents today for for overdue OSA follow-up.  He has severe sleep apnea. Unable to afford CPAP and most medications. [facility] He was able to get a used CPAP machine from a friend. He has a [employer] system one. Apply for patient assistance for Anoro.   02/23/2023 Patient presents today for follow-up.  Patient has a history of severe obstructive sleep apnea.  He is homeless, [facility]  He was able to get a CPAP machine from a friend.  During her last visit advised patient bring current CPAP machine by medical supply store to see if were able to use machine, pressure settings likely needed to be adjusted and device registered to him along with having mask fitting.  He is wearing CPAP nightly. He feels pressure is too low. He has not seen a clinical benefit from use. He remains extremely tired. In the past during titration studies he did notice benefit from use. 2014 titration optimal pressure 14cm. He has received CPAP supplies from [employer]. SD card provided, our office does not have a reader to access compliance report- he will need to bring by [employer] for download.   He is receiving Anoro from patient assistance. Feels it is not working well. Having more difficulty taking a deep breath. Denies overt wheezing or chest tightness.    04/07/2023- Interim hx  Patient presents today for 4-6 week follow-up. He has severe obstructive sleep apnea, AHI  57.1 an hour.  Patient was unable to afford CPAP but was able to acquire a used machine [employer] system one machine).  That particular CPAP was set to a pressure of 10 cm H20, unable to do auto settings.  During our last visit patient felt pressure was not strong enough and was still experiencing daytime fatigue.  Pressure was increased to 15 cm H2O.  DME order was placed to establish with [facility] for CPAP supplies.  Instructed to bring SD card by DME company for compliance download.  He experiences dyspnea with exertion, has difficulty taking a fell deep breath. PFTs suggestive of mild asthma with air trapping and increased DLCO. Breo caused weight gain. He had been taking ANORO for some time and getting through patient assistance. Given a sample of Trelegy until receiving PA.  Pressure change is better. He is wearing CPAP nightly but only for about 2-3 hours due to airleaks.  The first time he used CPAP he did not wake up with headache. He has no trouble sleeping. He is tired all the time. He has been prescribed medication in the past for hypersomnia. He tells me he has narcolepsy symptoms. He can not takes scheduled II medication. He is unsure he will be allowed to have sleep study.    Sleep testing:  NPSG 04/30/14- positive for moderate obstructive sleep apnea, AHI 16.9 per hour with moderate snoring and desaturation to 86%. CPAP titrated to 14 CWP  01/02/2018-sleep study- severe obstructive sleep apnea  with an AHI of 57.1   No Known Allergies  Immunization History  Administered Date(s) Administered   PFIZER(Purple Top)SARS-COV-2 Vaccination 04/19/2020   Tdap 11/08/2013    Past Medical History:  Diagnosis Date   ADD (attention deficit disorder)    ADD (attention deficit disorder)    Allergy    Anemia, iron deficiency    Anxiety    Asthma    Blood transfusion    Constipation    Depression    Depression    Exertional dyspnea    secondary to anemia   GERD (gastroesophageal  reflux disease)    Hiatal hernia    Narcolepsy    pt also reports episodes of passing out that are unrelated to narcolepsy about a year ago   Sleep apnea 01/2018   has a CPAP, "unable to use it"    Tobacco History: Social History   Tobacco Use  Smoking Status Never  Smokeless Tobacco Never   Counseling given: Not Answered   Outpatient Medications Prior to Visit  Medication Sig Dispense Refill   albuterol (VENTOLIN HFA) 108 (90 Base) MCG/ACT inhaler Inhale 1-2 puffs into the lungs every 6 (six) hours as needed for wheezing or shortness of breath. 6.7 g 3   pantoprazole (PROTONIX) 40 MG tablet Take 1 tablet (40 mg total) by mouth daily. 90 tablet 1   umeclidinium-vilanterol (ANORO ELLIPTA) 62.5-25 MCG/ACT AEPB Inhale 1 puff into the lungs daily. (Patient not taking: Reported on 04/07/2023) 90 each 4   No facility-administered medications prior to visit.      Review of Systems  Review of Systems  Constitutional:  Positive for fatigue.  HENT: Negative.    Respiratory: Negative.    Cardiovascular: Negative.      Physical Exam  BP 118/78 (BP Location: Left Arm, Cuff Size: Large)   Pulse 86   Temp 98.8 F (37.1 C) (Temporal)   Ht 5\' 11"  (1.803 m)   Wt 293 lb 3.2 oz (133 kg)   SpO2 97%   BMI 40.89 kg/m  Physical Exam Constitutional:      Appearance: Normal appearance.  HENT:     Head: Normocephalic and atraumatic.  Cardiovascular:     Rate and Rhythm: Normal rate and regular rhythm.  Pulmonary:     Effort: Pulmonary effort is normal.     Breath sounds: Normal breath sounds.  Neurological:     General: No focal deficit present.     Mental Status: He is alert and oriented to person, place, and time. Mental status is at baseline.  Psychiatric:        Mood and Affect: Mood normal.        Behavior: Behavior normal.        Thought Content: Thought content normal.        Judgment: Judgment normal.      Lab Results:  CBC    Component Value Date/Time   WBC  6.9 02/02/2023 1708   WBC 10.4 03/19/2022 1037   WBC 10.7 (H) 01/29/2022 1012   RBC 4.52 02/02/2023 1708   RBC 5.16 03/19/2022 1037   RBC 5.23 03/19/2022 1037   HGB 11.7 (L) 02/02/2023 1708   HCT 36.5 (L) 02/02/2023 1708   PLT 303 02/02/2023 1708   MCV 81 02/02/2023 1708   MCH 25.9 (L) 02/02/2023 1708   MCH 26.2 03/19/2022 1037   MCHC 32.1 02/02/2023 1708   MCHC 32.7 03/19/2022 1037   RDW 13.3 02/02/2023 1708   LYMPHSABS 0.8 02/02/2023 1708  MONOABS 0.6 03/19/2022 1037   EOSABS 0.7 (H) 02/02/2023 1708   BASOSABS 0.1 02/02/2023 1708    BMET    Component Value Date/Time   NA 138 02/02/2023 1708   K 4.7 02/02/2023 1708   CL 103 02/02/2023 1708   CO2 20 02/02/2023 1708   GLUCOSE 84 02/02/2023 1708   GLUCOSE 104 (H) 03/19/2022 1037   BUN 20 02/02/2023 1708   CREATININE 0.95 02/02/2023 1708   CREATININE 1.05 03/19/2022 1037   CREATININE 1.26 05/29/2014 0847   CALCIUM 9.6 02/02/2023 1708   GFRNONAA >60 03/19/2022 1037   GFRNONAA 67 05/29/2014 0847   GFRAA 94 09/14/2017 1157   GFRAA 77 05/29/2014 0847    BNP No results found for: "BNP"  ProBNP No results found for: "PROBNP"  Imaging: No results found.   Assessment & Plan:   OSA (obstructive sleep apnea) - PSG 01/03/23 >> severe OSA, AHI 57.1/hour. Patient is homeless/lives [facility]. He was unable to afford CPAP. Received a used [employer] One CPAP from a third party. He reports compliance with use, only getting about 2-3 hours of usage. Continues to report excessive daytime sleepiness along with some narcolepsy symptoms. Current pressure 15cm h20. Getting compliance download has been a challenge, he physically needs to bring machine by [facility] in [place] for data to be pulled.  We will order CPAP titration study along with MSLT due to persistent daytime sleepiness. Advised patient aim to wear CPAP nightly 4-6 hours or longer. DME company is [employer]. Recommend patient  establish with Dr. Maple Hudson to discuss medications for hypersomnia after testing has been completed (unclear if he can take scheduled II mediations while in program)  Asthma - Stable; No acute respiratory symptoms. PFTs suggestive of mild asthma with air trapping and increased DLCO. Breo caused weight gain. He was switched to LABA/LAMA in JUNE 2023. Receiving Anoro through patient assistance. He is currently out of medication and needs new prescription. Unfortunately we did not have samples of ANORO today so he was provided a sample of Trelegy until he can get medication through patient assistance.    Glenford Bayley, NP 04/07/2023

## 2023-04-07 NOTE — Patient Instructions (Addendum)
Recommendations: - Use Trelegy sample until you receive Anoro medication  - Apply for patient assistance for Anoro - Please bring CPAP by [place] location for download- have them fax it to use Attn: Ames Dura - You need a CPAP titration study in lab to determine correct pressure settings and then a nap test to assess for narcolepsy   Please bring CPAP by [facility] [address] [place], [place] [address] Phone: [phone]  Orders: - CPAP titration study - Multisleep latency test   Follow-up: 6-8 weeks with Dr. Maple Hudson to discuss medications for hypersomnia/and review CPAP titration study   Prior to any further visit you need to bring CPAP machine by [place] location and have them print out a compliance reports

## 2023-04-08 IMAGING — DX DG CHEST 1V
1 series · 1 of 1 positions shown · non-contrast
Comparison: PA Lat 02/18/2019.

CLINICAL DATA: Unspecified drug overdose.

EXAM:
CHEST  1 VIEW

[chest ap]
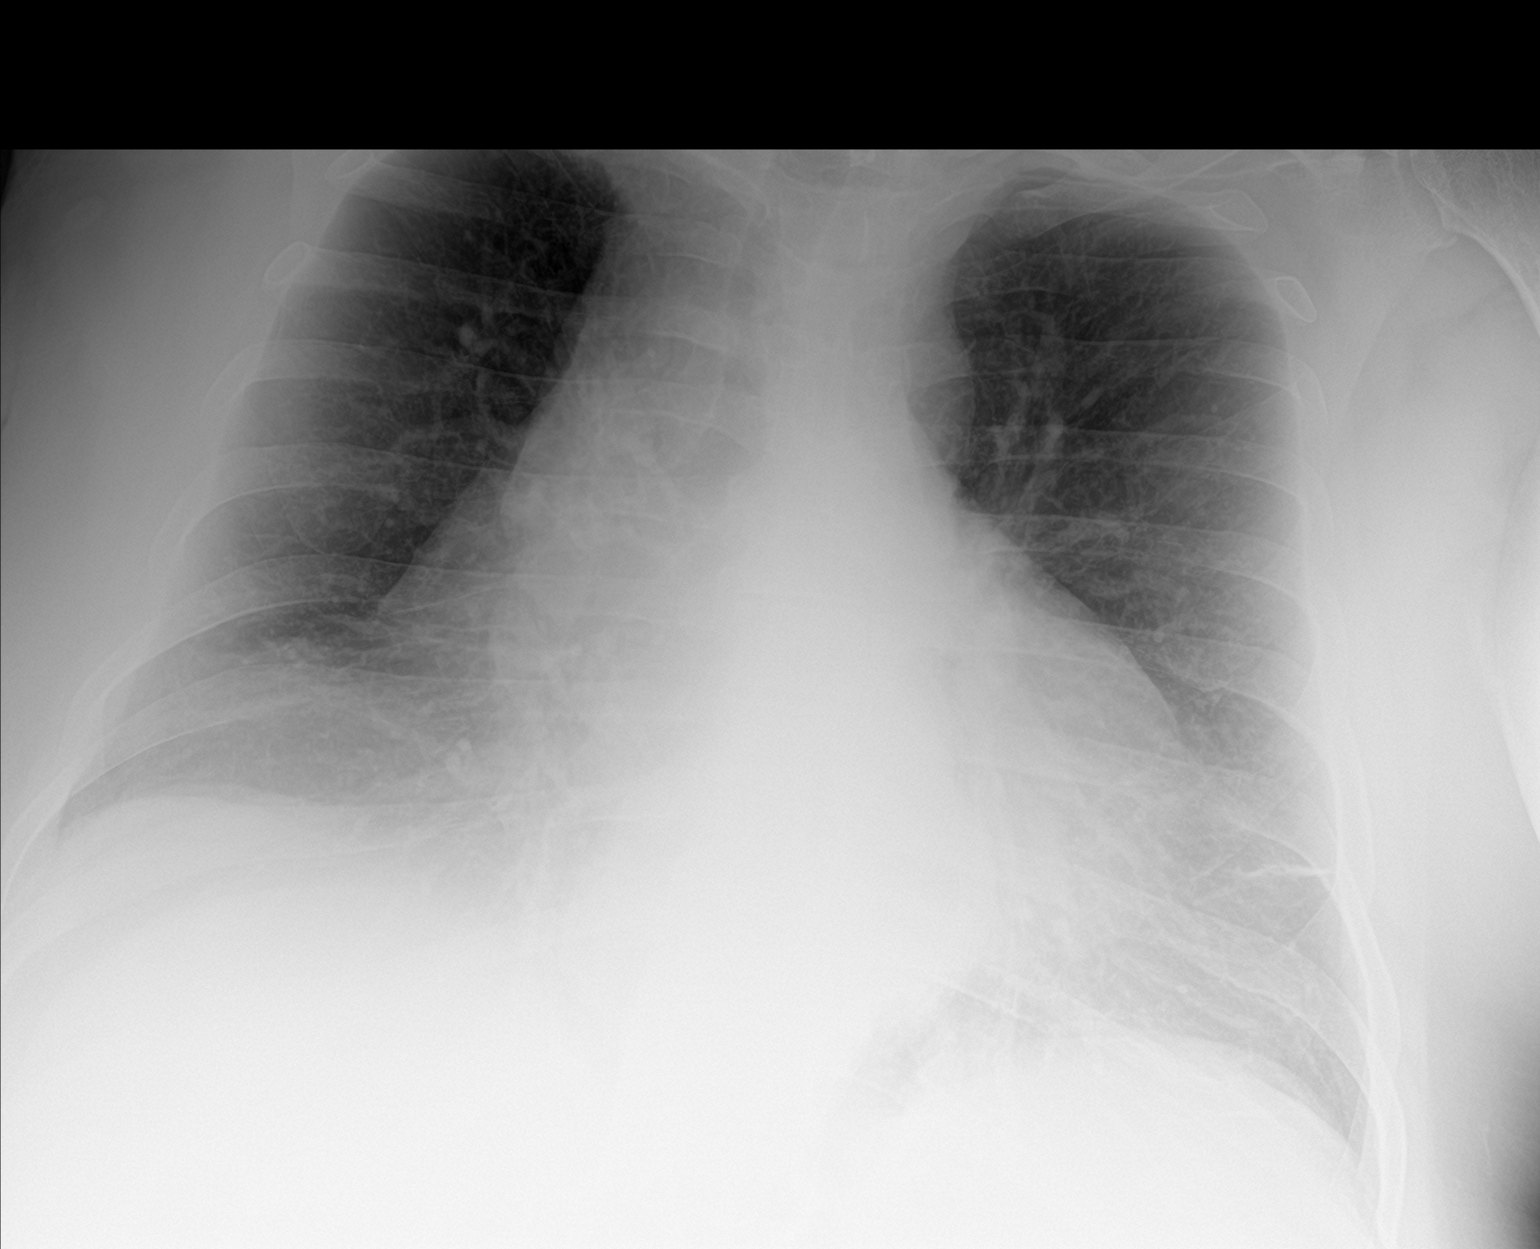

[1 of 1 positions shown; findings below may reference images not displayed]

FINDINGS: The heart silhouette is moderately enlarged but somewhat exaggerated
by bilateral epipericardial fat pads. The lungs clear of infiltrates
with increased linear atelectasis or scarring lateral left base. No
pleural effusion is seen. No vascular congestion is seen. Stable
mediastinum with aortic tortuosity, hiatal hernia. Thoracic cage
grossly intact.
IMPRESSION: Cardiomegaly with no evidence of acute chest disease.

## 2023-04-19 ENCOUNTER — Other Ambulatory Visit (HOSPITAL_COMMUNITY): Payer: Self-pay

## 2023-04-20 ENCOUNTER — Other Ambulatory Visit: Payer: Self-pay

## 2023-04-20 ENCOUNTER — Ambulatory Visit: Payer: Self-pay | Admitting: Physician Assistant

## 2023-04-21 ENCOUNTER — Ambulatory Visit (HOSPITAL_COMMUNITY): Payer: No Payment, Other | Admitting: Licensed Clinical Social Worker

## 2023-04-23 ENCOUNTER — Other Ambulatory Visit (HOSPITAL_COMMUNITY): Payer: Self-pay

## 2023-04-23 ENCOUNTER — Ambulatory Visit (INDEPENDENT_AMBULATORY_CARE_PROVIDER_SITE_OTHER): Payer: Self-pay | Admitting: Family Medicine

## 2023-04-23 ENCOUNTER — Encounter: Payer: Self-pay | Admitting: Family Medicine

## 2023-04-23 ENCOUNTER — Other Ambulatory Visit: Payer: Self-pay

## 2023-04-23 ENCOUNTER — Telehealth: Payer: Self-pay

## 2023-04-23 ENCOUNTER — Encounter: Payer: Self-pay | Admitting: Physician Assistant

## 2023-04-23 VITALS — BP 125/73 | HR 84 | Ht 71.0 in | Wt 297.0 lb

## 2023-04-23 DIAGNOSIS — G4733 Obstructive sleep apnea (adult) (pediatric): Secondary | ICD-10-CM

## 2023-04-23 DIAGNOSIS — M25473 Effusion, unspecified ankle: Secondary | ICD-10-CM

## 2023-04-23 DIAGNOSIS — D508 Other iron deficiency anemias: Secondary | ICD-10-CM

## 2023-04-23 NOTE — Patient Instructions (Addendum)
It was nice seeing you today.  Here is the number to contact [facility] Address: [address], [place], [place] [address] Phone: [phone]  Zoster Vaccine Injection What is this medication? ZOSTER VACCINE (ZOS ter vak SEEN) reduces the risk of herpes zoster (shingles). It does not treat shingles. It is still possible to get shingles after receiving the vaccine, but the symptoms may be less severe or not last as long. It works by helping your immune system learn how to fight off a future infection. This medicine may be used for other purposes; ask your health care provider or pharmacist if you have questions. COMMON BRAND NAME(S): [facility] What should I tell my care team before I take this medication? They need to know if you have any of these conditions: Cancer Immune system problems An unusual or allergic reaction to Zoster vaccine, other medications, foods, dyes, or preservatives Pregnant or trying to get pregnant Breastfeeding How should I use this medication? This vaccine is injected into a muscle. It is given by your care team. This vaccine requires 2 doses to get the full benefit. Set a reminder for when your next dose is due. A copy of Vaccine Information Statements will be given before each vaccination. Be sure to read this information carefully each time. This sheet may change often. Talk to your care team about the use of this vaccine in children. This vaccine is not approved for use in children. Overdosage: If you think you have taken too much of this medicine contact a poison control center or emergency room at once. NOTE: This medicine is only for you. Do not share this medicine with others. What if I miss a dose? Keep appointments for follow-up (booster) doses. It is important not to miss your dose. Call your care team if you are unable to keep an appointment. What may interact with this medication? Medications that suppress your immune  system Medications to treat cancer Steroid medications, such as prednisone or cortisone This list may not describe all possible interactions. Give your health care provider a list of all the medicines, herbs, non-prescription drugs, or dietary supplements you use. Also tell them if you smoke, drink alcohol, or use illegal drugs. Some items may interact with your medicine. What should I watch for while using this medication? Visit your care team regularly. This vaccine, like all vaccines, may not fully protect everyone. What side effects may I notice from receiving this medication? Side effects that you should report to your care team as soon as possible: Allergic reactions--skin rash, itching, hives, swelling of the face, lips, tongue, or throat Side effects that usually do not require medical attention (report these to your care team if they continue or are bothersome): Chills Fatigue Feeling faint or lightheaded Fever Headache Muscle pain Pain, redness, or irritation at injection site This list may not describe all possible side effects. Call your doctor for medical advice about side effects. You may report side effects to FDA at 1-800-FDA-1088. Where should I keep my medication? This vaccine is only given by your care team. It will not be stored at home. NOTE: This sheet is a summary. It may not cover all possible information. If you have questions about this medicine, talk to your doctor, pharmacist, or health care provider.  2024 Elsevier/Gold Standard (2022-02-12 00:00:00)

## 2023-04-23 NOTE — Patient Instructions (Signed)
Visit Information  The Patient                                              was given information about Medicaid Managed Care team care coordination services and consented to engagement with the Medicaid Managed Care team.   Social Worker will follow up in 30 days.   Alexis Shields, BSW, MHA [place]  Managed Medicaid Social Worker [phone]  

## 2023-04-23 NOTE — Assessment & Plan Note (Signed)
No swelling observed today ?? Venous stasis edema Discussed intermittent frequent leg elevation Limit salt intake Wear compression stockings Monitor closely

## 2023-04-23 NOTE — Assessment & Plan Note (Signed)
Preliminarily of Trelegy Should be on Anoro I have messaged our pharmacy tech to help with medication request and shipping. Information provided on contacting sleep clinic for sleep study F/U with Pulm and sleep clinic as planned

## 2023-04-23 NOTE — Patient Outreach (Signed)
Medicaid Managed Care Social Work Note  04/23/2023 Name:  [name] MRN:  [id number] DOB:  [birth year]  [name] is an 57 y.o. year old male who is a primary patient of [name], MD.  The Medicaid Managed Care Coordination team was consulted for assistance with:   disability assistance  [name] was given information about Medicaid Managed Care Coordination team services today. Jeffery Heman Glaspy Patient agreed to services and verbal consent obtained.  Engaged with patient  for by telephone forfollow up visit in response to referral for case management and/or care coordination services.   Assessments/Interventions:  Review of past medical history, allergies, medications, health status, including review of consultants reports, laboratory and other test data, was performed as part of comprehensive evaluation and provision of chronic care management services.  SDOH: (Social Determinant of Health) assessments and interventions performed: SDOH Interventions    Flowsheet Row Counselor from 03/31/2023 in [place] [facility] from 02/13/2022 in [facility] Office Visit from 12/19/2021 in [facility] Chronic Care Management from 05/06/2021 in [facility] Office Visit from 09/14/2017 in [facility]  SDOH Interventions       Food Insecurity Interventions -- Other (Comment)  [pt already connected with FNS,  is not interested in other resources] -- -- --  Housing Interventions -- Patient Refused  [pt is not interested in a shelter] -- -- --  Transportation Interventions -- -- -- Retail banker --  Depression Interventions/Treatment  -- -- Referral to Psychiatry -- Currently on Treatment  Financial Strain Interventions Financial Counselor -- -- [id number] Referral --     BSW completed a telephone outreach with patient while he was in the office.  Patient states he did go to the [facility] again and spoke with Tereso Newcomer, she stated that patient is eligible for reconsideration but a referral was needed. BSW will contact Megan at the [facility] to see what type of referral is needed.   Advanced Directives Status:  Not addressed in this encounter.  Care Plan                 No Known Allergies  Medications Reviewed Today   Medications were not reviewed in this encounter     Patient Active Problem List   Diagnosis Date Noted   GAD (generalized anxiety disorder) 03/31/2023   Paresthesia 03/12/2023   Hyperlipidemia 02/17/2023   Ankle swelling 02/02/2023   Orbital swelling 02/02/2023   Social problem 12/18/2022   Elevated BP without diagnosis of hypertension 02/10/2022   Asthma 12/19/2021   Renal cyst 12/17/2021   Homelessness 03/14/2021   Morbid obesity (HCC) 03/14/2021   Amnesia 02/09/2020   Hypogonadism in male 09/14/2017   OSA (obstructive sleep apnea) 05/29/2014   Iron deficiency anemia 12/22/2013   Chronic fatigue 07/07/2013   Adult ADHD 07/07/2013   Depression 01/07/2012   GERD (gastroesophageal reflux disease) 01/07/2012    Conditions to be addressed/monitored per PCP order:   disability resources  There are no care plans that you recently modified to display for this patient.   Follow up:  Patient agrees to Care Plan and Follow-up.  Plan: The Managed Medicaid care management team will reach out to the patient again over the next 30 days.  Date/time of next scheduled Social Work care management/care coordination outreach:  05/26/23  [name], BSW, MHA [place]  Managed [facility] Social Worker [phone]  [phone]  

## 2023-04-23 NOTE — Patient Outreach (Signed)
BSW spoke with the director of the [facility], she states patient would not qualify for disability with his diagnosis. BSW will inform patient of this information.  Abelino Derrick, MHA [facility]  Managed [facility] Social Worker [phone]

## 2023-04-23 NOTE — Progress Notes (Signed)
    SUBJECTIVE:   CHIEF COMPLAINT / HPI:   Asthma/OSA: He denies any SOB. He is out of his Anoro and was given Trelegy sample at his pulmonologist pending when his Anoro is shipped to him. He also stated that pulm referred him to sleep clinic. However, there would be no way for them to reach him to schedule since he does not have phone access.   Fatigue/Iron def: He has yet to schedule an iron infusion but is scheduled to see his oncologist. He endorsed persistent fatigue, which worsens with exertion. 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  Leg edema: Unchanged.   PERTINENT  PMH / PSH: PMHx reviewed  OBJECTIVE:   BP 125/73   Pulse 84   Ht 5\' 11"  (1.803 m)   Wt 297 lb (134.7 kg)   SpO2 98%   BMI 41.42 kg/m   Physical Exam Vitals and nursing note reviewed.  Cardiovascular:     Rate and Rhythm: Normal rate and regular rhythm.     Heart sounds: Normal heart sounds. No murmur heard. Pulmonary:     Effort: Pulmonary effort is normal. No respiratory distress.     Breath sounds: Normal breath sounds. No wheezing.  Abdominal:     General: Abdomen is flat. Bowel sounds are normal. There is no distension.     Palpations: Abdomen is soft.     Tenderness: There is no abdominal tenderness.  Musculoskeletal:     Right lower leg: No edema.     Left lower leg: No edema.      ASSESSMENT/PLAN:   Asthma Preliminarily of Trelegy Should be on Anoro I have messaged our pharmacy tech to help with medication request and shipping. Information provided on contacting sleep clinic for sleep study F/U with Pulm and sleep clinic as planned  OSA (obstructive sleep apnea) Doing well on CPAP which is gradually being titrated by Pulm per the patient. Information provided on contacting sleep clinic for repeat sleep study F/U with Pulm and sleep clinic  as planned  Iron deficiency anemia Likely causing his fatigue as well F/U oncology/hematology for iron infusion management Graded exercise for fatigue  Ankle swelling No swelling observed today ?? Venous stasis edema Discussed intermittent frequent leg elevation Limit salt intake Wear compression stockings Monitor closely     Janit Pagan, MD [facility]

## 2023-04-23 NOTE — Assessment & Plan Note (Addendum)
Likely causing his fatigue as well F/U oncology/hematology for iron infusion management Graded exercise for fatigue

## 2023-04-23 NOTE — Assessment & Plan Note (Signed)
Doing well on CPAP which is gradually being titrated by Pulm per the patient. Information provided on contacting sleep clinic for repeat sleep study F/U with Pulm and sleep clinic as planned

## 2023-04-26 ENCOUNTER — Other Ambulatory Visit: Payer: Self-pay | Admitting: Physician Assistant

## 2023-04-26 DIAGNOSIS — D500 Iron deficiency anemia secondary to blood loss (chronic): Secondary | ICD-10-CM

## 2023-04-27 ENCOUNTER — Inpatient Hospital Stay: Payer: Self-pay | Attending: Physician Assistant

## 2023-04-27 ENCOUNTER — Inpatient Hospital Stay (HOSPITAL_BASED_OUTPATIENT_CLINIC_OR_DEPARTMENT_OTHER): Payer: Self-pay | Admitting: Physician Assistant

## 2023-04-27 VITALS — BP 135/55 | HR 70 | Temp 98.4°F | Resp 20 | Wt 298.6 lb

## 2023-04-27 DIAGNOSIS — N289 Disorder of kidney and ureter, unspecified: Secondary | ICD-10-CM | POA: Insufficient documentation

## 2023-04-27 DIAGNOSIS — D500 Iron deficiency anemia secondary to blood loss (chronic): Secondary | ICD-10-CM | POA: Insufficient documentation

## 2023-04-27 DIAGNOSIS — K922 Gastrointestinal hemorrhage, unspecified: Secondary | ICD-10-CM | POA: Diagnosis present

## 2023-04-27 DIAGNOSIS — R110 Nausea: Secondary | ICD-10-CM | POA: Insufficient documentation

## 2023-04-27 LAB — IRON AND IRON BINDING CAPACITY (CC-WL,HP ONLY)
Iron: 29 ug/dL — ABNORMAL LOW (ref 45–182)
Saturation Ratios: 7 % — ABNORMAL LOW (ref 17.9–39.5)
TIBC: 420 ug/dL (ref 250–450)
UIBC: 391 ug/dL — ABNORMAL HIGH (ref 117–376)

## 2023-04-27 LAB — CBC WITH DIFFERENTIAL (CANCER CENTER ONLY)
Abs Immature Granulocytes: 0.01 10*3/uL (ref 0.00–0.07)
Basophils Absolute: 0.1 10*3/uL (ref 0.0–0.1)
Basophils Relative: 1 %
Eosinophils Absolute: 0.4 10*3/uL (ref 0.0–0.5)
Eosinophils Relative: 7 %
HCT: 33.9 % — ABNORMAL LOW (ref 39.0–52.0)
Hemoglobin: 10.5 g/dL — ABNORMAL LOW (ref 13.0–17.0)
Immature Granulocytes: 0 %
Lymphocytes Relative: 13 %
Lymphs Abs: 0.8 10*3/uL (ref 0.7–4.0)
MCH: 23.5 pg — ABNORMAL LOW (ref 26.0–34.0)
MCHC: 31 g/dL (ref 30.0–36.0)
MCV: 76 fL — ABNORMAL LOW (ref 80.0–100.0)
Monocytes Absolute: 0.8 10*3/uL (ref 0.1–1.0)
Monocytes Relative: 12 %
Neutro Abs: 4.5 10*3/uL (ref 1.7–7.7)
Neutrophils Relative %: 67 %
Platelet Count: 302 10*3/uL (ref 150–400)
RBC: 4.46 MIL/uL (ref 4.22–5.81)
RDW: 15.2 % (ref 11.5–15.5)
WBC Count: 6.6 10*3/uL (ref 4.0–10.5)
nRBC: 0 % (ref 0.0–0.2)

## 2023-04-27 LAB — FERRITIN: Ferritin: 4 ng/mL — ABNORMAL LOW (ref 24–336)

## 2023-04-27 NOTE — Patient Instructions (Signed)
Labs today show anemia, waiting for iron levels to finalize before arrange IV iron infusion. Please call the clinic on Thursday, 04/29/2023 to finalize plan for IV iron.   You are scheduled for repeat renal ultrasound to monitor the renal cystic lesions seen on previous ultrasound.   Plan to see you back in 8 weeks for a lab check and follow up visit in 4 months.

## 2023-04-27 NOTE — Progress Notes (Signed)
[facility] [phone]   [phone]  PROGRESS NOTE  Patient Care Team: [name], MD as PCP - General (Family Medicine) Shaune Leeks as Social Worker  Hematological/Oncological History  #Iron deficiency anemia 2/2 to Presume GI Bleeding 1) 03/14/2021: TIBC 349, Iron 30 (L), Iron saturation 9% (L), Ferritin 15 (L), Vitamin B12 537, Folate 7.4, WBC 8.6, Hgb 10.7 (L), MCV 74 (L), Plt 450.   2) 05/07/2021: Establish care with Georga Kaufmann PA-C  3) 05/26/2021-06/20/2021: Received IV venofer 200 mg once a week x 5 doses  4) 07/29/2021-08/26/2021: Received IV venofer 200 mg once a week x 5 doses  5) 10/13/2021: Received IV monoferric 1000 mg x 1 dose.   6) 10/28/2021: EGD showed esophagitis with bleeding, large haital hernia with multiple cameron ulcers, gastritis, duodenitis.Colonoscopy was incomplete due to poor prep. Evidence of non-bleeding internal hemorrhoids.   7) 11/18/2021: Received IV monoferric 1000 mg x 1 dose  8) 01/13/2022: Received IV monoferric 1000 mg x 1 dose  HISTORY OF PRESENTING ILLNESS:  [name] 57 y.o. male returns for follow-up for iron deficiency anemia.  His last visit was on 12/16/2021.  In the interim since his last visit he received another dose of IV monoferric on 01/13/2022.  On exam today, Mr. Verzosa reports that he is having extreme fatigue again. He is currently in a residential program since April 2024. He reports that he is having nausea that is triggered with liquids including water. He denies any vomiting episodes. He denies easy bruising or signs of active bleeding including hematochezia or melena. He does have persistent shortness of breath with exertion. He denies fevers, chills, sweats, chest pain or cough. He has no other complaints. Rest of the ROS is below.   MEDICAL HISTORY:  Past Medical History:  Diagnosis Date   ADD (attention deficit disorder)    ADD (attention deficit disorder)    Allergy     Anemia, iron deficiency    Anxiety    Asthma    Blood transfusion    Constipation    Depression    Depression    Exertional dyspnea    secondary to anemia   GERD (gastroesophageal reflux disease)    Hiatal hernia    Narcolepsy    pt also reports episodes of passing out that are unrelated to narcolepsy about a year ago   Sleep apnea 01/2018   has a CPAP, "unable to use it"    SURGICAL HISTORY: Past Surgical History:  Procedure Laterality Date   COLONOSCOPY     UPPER GASTROINTESTINAL ENDOSCOPY      SOCIAL HISTORY: Social History   Socioeconomic History   Marital status: Single    Spouse name: Not on file   Number of children: 0   Years of education: Not on file   Highest education level: Not on file  Occupational History   Occupation: student-Business  Tobacco Use   Smoking status: Never   Smokeless tobacco: Never  Vaping Use   Vaping status: Never Used  Substance and Sexual Activity   Alcohol use: No   Drug use: No   Sexual activity: Not Currently  Other Topics Concern   Not on file  Social History Narrative   Lives alone. Never married, no children.  Has two sister who live in [place].  Student at work study.  [employer].  Working toward Chief Technology Officer.  Previously worked in Geophysicist/field seismologist.  Was also previously homeless due to job loss  and psychiatric exacerbation.   Social Determinants of Health   Financial Resource Strain: High Risk (03/31/2023)   Overall Financial Resource Strain (CARDIA)    Difficulty of Paying Living Expenses: Very hard  Food Insecurity: Food Insecurity Present (03/31/2023)   Hunger Vital Sign    Worried About Running Out of Food in the Last Year: Often true    Ran Out of Food in the Last Year: Often true  Transportation Needs: No Transportation Needs (02/13/2022)   PRAPARE - Administrator, Civil Service (Medical): No    Lack of Transportation (Non-Medical): No  Physical Activity: Inactive  (03/31/2023)   Exercise Vital Sign    Days of Exercise per Week: 0 days    Minutes of Exercise per Session: 0 min  Stress: Stress Concern Present (03/31/2023)   [employer] of Occupational Health - Occupational Stress Questionnaire    Feeling of Stress : Very much  Social Connections: Socially Isolated (03/31/2023)   Social Connection and Isolation Panel [NHANES]    Frequency of Communication with Friends and Family: Never    Frequency of Social Gatherings with Friends and Family: Never    Attends Religious Services: More than 4 times per year    Active Member of [employer] or Organizations: No    Attends Banker Meetings: Never    Marital Status: Never married  Intimate Partner Violence: Not At Risk (03/31/2023)   Humiliation, Afraid, Rape, and Kick questionnaire    Fear of Current or Ex-Partner: No    Emotionally Abused: No    Physically Abused: No    Sexually Abused: No    FAMILY HISTORY: Family History  Problem Relation Age of Onset   Diabetes Paternal Grandfather    Cancer Neg Hx    Stroke Neg Hx    Breast cancer Neg Hx    Celiac disease Neg Hx    Cirrhosis Neg Hx    Clotting disorder Neg Hx    Colitis Neg Hx    Colon cancer Neg Hx    Colon polyps Neg Hx    Crohn's disease Neg Hx    Cystic fibrosis Neg Hx    Esophageal cancer Neg Hx    Heart disease Neg Hx    Hemochromatosis Neg Hx    Inflammatory bowel disease Neg Hx    Irritable bowel syndrome Neg Hx    Kidney disease Neg Hx    Liver cancer Neg Hx    Liver disease Neg Hx    Ovarian cancer Neg Hx    Pancreatic cancer Neg Hx    Prostate cancer Neg Hx    Rectal cancer Neg Hx    Stomach cancer Neg Hx    Ulcerative colitis Neg Hx    Uterine cancer Neg Hx    Wilson's disease Neg Hx     ALLERGIES:  has No Known Allergies.  MEDICATIONS:  Current Outpatient Medications  Medication Sig Dispense Refill   albuterol (VENTOLIN HFA) 108 (90 Base) MCG/ACT inhaler Inhale 1-2 puffs into the lungs every 6  (six) hours as needed for wheezing or shortness of breath. (Patient not taking: Reported on 04/23/2023) 6.7 g 3   pantoprazole (PROTONIX) 40 MG tablet Take 1 tablet (40 mg total) by mouth daily. 90 tablet 1   umeclidinium-vilanterol (ANORO ELLIPTA) 62.5-25 MCG/ACT AEPB Inhale 1 puff into the lungs daily. (Patient not taking: Reported on 04/23/2023) 90 each 4   No current facility-administered medications for this visit.    REVIEW OF SYSTEMS:  Constitutional: ( - ) fevers, ( - )  chills , ( - ) night sweats Eyes: ( - ) blurriness of vision, ( - ) double vision, ( - ) watery eyes Ears, nose, mouth, throat, and face: ( - ) mucositis, ( - ) sore throat Respiratory: ( - ) cough, ( + ) dyspnea, ( - ) wheezes Cardiovascular: ( - ) palpitation, ( - ) chest discomfort, ( - ) lower extremity swelling Gastrointestinal:  ( +) nausea, ( - ) heartburn, ( - ) change in bowel habits Skin: ( - ) abnormal skin rashes Lymphatics: ( - ) new lymphadenopathy, ( - ) easy bruising Neurological: ( - ) numbness, ( - ) tingling, ( - ) new weaknesses Behavioral/Psych: ( - ) mood change, ( - ) new changes  All other systems were reviewed with the patient and are negative.  PHYSICAL EXAMINATION: ECOG PERFORMANCE STATUS: 2 - Symptomatic, <50% confined to bed  Vitals:   04/27/23 1029  BP: (!) 135/55  Pulse: 70  Resp: 20  Temp: 98.4 F (36.9 C)  SpO2: 98%    Filed Weights   04/27/23 1029  Weight: 298 lb 9.6 oz (135.4 kg)     GENERAL: well appearing male in NAD, obese SKIN: skin color, texture, turgor are normal, no rashes or significant lesions EYES: conjunctiva are pink and non-injected, sclera clear LUNGS: clear to auscultation and percussion with normal breathing effort HEART: Regular rhythm and rate. No murmurs and no lower extremity edema. Musculoskeletal: no cyanosis of digits and no clubbing  PSYCH: alert & oriented x 3, fluent speech NEURO: no focal motor/sensory deficits  LABORATORY DATA:  I  have reviewed the data as listed    Latest Ref Rng & Units 04/27/2023   10:07 AM 02/02/2023    5:08 PM 12/18/2022   11:13 AM  CBC  WBC 4.0 - 10.5 K/uL 6.6  6.9    Hemoglobin 13.0 - 17.0 g/dL 16.1  09.6  04.5   Hematocrit 39.0 - 52.0 % 33.9  36.5    Platelets 150 - 400 K/uL 302  303         Latest Ref Rng & Units 02/02/2023    5:08 PM 03/19/2022   10:37 AM 01/29/2022   10:12 AM  CMP  Glucose 70 - 99 mg/dL 84  409  811   BUN 6 - 24 mg/dL 20  20  23    Creatinine 0.76 - 1.27 mg/dL 9.14  7.82  9.56   Sodium 134 - 144 mmol/L 138  137  138   Potassium 3.5 - 5.2 mmol/L 4.7  4.5  4.1   Chloride 96 - 106 mmol/L 103  106  109   CO2 20 - 29 mmol/L 20  24  24    Calcium 8.7 - 10.2 mg/dL 9.6  9.3  8.7   Total Protein 6.0 - 8.5 g/dL 6.5  7.5    Total Bilirubin 0.0 - 1.2 mg/dL <2.1  0.3    Alkaline Phos 44 - 121 IU/L 113  100    AST 0 - 40 IU/L 19  14    ALT 0 - 44 IU/L 12  13      ASSESSMENT & PLAN [name] is a 57 y.o. male returns for follow-up for iron deficiency anemia.  #Iron deficiency anemia 2/2 GI bleeding: --Established care with Dr. Eulah Pont at [facility]. Underwent EGD/colonoscopy on 10/28/2021 with findings that showed esophagitis with bleeding, large haital hernia with multiple cameron ulcers, gastritis, duodenitis.  Colonoscopy was incomplete due to poor prep.  --Patient is unable to tolerate oral iron supplementation due to constipation.   --Most recently received IV monoferric 1000 mg x 1 dose on 01/13/2022 --Labs today show worsening anemia with Hgb 10.5, MCV 76.0. Iron panel shows deficiency with iron 29, saturation 7%, ferritin levels pending --Recommend IV monoferric x 1 dose next week to help bolster iron levels back to normal range.   #Renal Cystic lesions: --Seen on renal US from 11/10/2021 with recommendations to repeat to monitor --Repeat renal US scheduled for tomorrow 04/28/2023.   #Nausea:  --Mainly triggered with liquids.  --Etiology unknown but will request  follow up with GI to further evaluate   Follow up: 8 weeks: labs only 16 weeks: labs and follow up with Dr. Leonides Schanz  Orders Placed This Encounter  Procedures   US RENAL    Standing Status:   Future    Standing Expiration Date:   04/26/2024    Order Specific Question:   Reason for Exam (SYMPTOM  OR DIAGNOSIS REQUIRED)    Answer:   monitor renal cyts    Order Specific Question:   Preferred imaging location?    Answer:   [facility]   All questions were answered. The patient knows to call the clinic with any problems, questions or concerns.  I have spent a total of 30 minutes minutes of face-to-face and non-face-to-face time, preparing to see the patient, performing a medically appropriate examination, counseling and educating the patient, ordering medications/tests, communicating with other health care professionals, documenting clinical information in the electronic health record, and care coordination.    Georga Kaufmann PA-C Dept of Hematology and Oncology [facility] Phone: [phone]

## 2023-04-28 ENCOUNTER — Ambulatory Visit (HOSPITAL_COMMUNITY)
Admission: RE | Admit: 2023-04-28 | Discharge: 2023-04-28 | Disposition: A | Discharge status: Home / Self Care | Payer: Self-pay | Source: Ambulatory Visit | Attending: Physician Assistant | Admitting: Physician Assistant

## 2023-04-28 DIAGNOSIS — N289 Disorder of kidney and ureter, unspecified: Secondary | ICD-10-CM | POA: Insufficient documentation

## 2023-04-29 NOTE — Progress Notes (Signed)
I have tried to reach [name] and it says "the call can not be completed" . I will try to reach him later. He needs to f/u with Dr Leonides Schanz for nausea the office note states.

## 2023-05-03 NOTE — Progress Notes (Signed)
I was unable to reach Renner [place], the recording says unable to complete call.

## 2023-05-05 NOTE — Progress Notes (Signed)
I tried to reach [name] again and it still says "call can not be completed". I will mail him a letter to call us to set up an appointment.

## 2023-05-05 NOTE — Progress Notes (Signed)
I realized when I went to mail him a letter that it states Homeless so no mailing address. I reached out to his patient contact Davis Gourd at [phone] and left them a detailed message to please have Bradrick call us to set up an appointment with Dr Leonides Schanz.

## 2023-05-05 NOTE — Progress Notes (Signed)
I spoke to Natasha Mead his contact person and she tells me that he lives at [facility] [address] [phone]. I will try to reach them tomorrow to set up an appointment for nausea f/u with Dr Norwood Levo.

## 2023-05-06 NOTE — Progress Notes (Signed)
I called [facility] [employer] and had to leave a message for them to call me back.

## 2023-05-11 ENCOUNTER — Ambulatory Visit (HOSPITAL_COMMUNITY): Payer: No Payment, Other | Admitting: Licensed Clinical Social Worker

## 2023-05-11 NOTE — Progress Notes (Signed)
I have left [facility] another message to please call me back so we can get this appointment set up.

## 2023-05-12 NOTE — Progress Notes (Signed)
I left yet another message today 05/12/2023 on [name]'s voicemail for them to please call us to set up this appointment for [name]. I realize that the messages I have documented look like they all occurred on 04/27/2023 because it is in the same thread. They were on different days not 04/27/2023.

## 2023-05-13 ENCOUNTER — Other Ambulatory Visit (HOSPITAL_COMMUNITY): Payer: Self-pay

## 2023-05-13 ENCOUNTER — Encounter (HOSPITAL_COMMUNITY): Payer: Self-pay | Admitting: Physician Assistant

## 2023-05-13 ENCOUNTER — Encounter: Payer: Self-pay | Admitting: Physician Assistant

## 2023-05-13 ENCOUNTER — Ambulatory Visit (INDEPENDENT_AMBULATORY_CARE_PROVIDER_SITE_OTHER): Payer: No Payment, Other | Admitting: Physician Assistant

## 2023-05-13 ENCOUNTER — Ambulatory Visit (HOSPITAL_COMMUNITY): Payer: No Payment, Other | Admitting: Student

## 2023-05-13 ENCOUNTER — Other Ambulatory Visit: Payer: Self-pay

## 2023-05-13 VITALS — BP 136/85 | HR 75 | Temp 97.9°F | Ht 71.0 in | Wt 295.6 lb

## 2023-05-13 DIAGNOSIS — F332 Major depressive disorder, recurrent severe without psychotic features: Secondary | ICD-10-CM | POA: Diagnosis not present

## 2023-05-13 DIAGNOSIS — F411 Generalized anxiety disorder: Secondary | ICD-10-CM | POA: Diagnosis not present

## 2023-05-13 MED ORDER — VENLAFAXINE HCL ER 37.5 MG PO CP24
37.5000 mg | ORAL_CAPSULE | Freq: Every day | ORAL | 1 refills | Status: DC
Start: 2023-05-13 — End: 2023-05-27
  Filled 2023-05-13 (×2): qty 30, 30d supply, fill #0

## 2023-05-13 NOTE — Progress Notes (Signed)
Psychiatric Initial Adult Assessment   Patient Identification: [name] MRN:  [id number] Date of Evaluation:  05/13/2023 Referral Source: Referred by Licensed Clinical Social Worker Chief Complaint:   Chief Complaint  Patient presents with   Establish Care   Medication Management   Visit Diagnosis:    ICD-10-CM   1. Severe episode of recurrent major depressive disorder, without psychotic features (HCC)  F33.2 venlafaxine XR (EFFEXOR-XR) 37.5 MG 24 hr capsule    2. GAD (generalized anxiety disorder)  F41.1 venlafaxine XR (EFFEXOR-XR) 37.5 MG 24 hr capsule      History of Present Illness:    [name] is a 57 year old male with a past psychiatric history significant for attention deficit hyperactivity disorder and major depressive disorder who presents to [facility] to establish psychiatric care and for medication management.  Patient presents today encounter with a chief complaint of "nothing is working out for me." Patient endorses a past history of psychiatric care and states that he last saw his psychiatrist years ago.  Patient's reports that his biggest concern is "am I ever not going to be depressed."  Patient reports that he has been depressed for years.  Patient rates his depression an 8 out of 10 with 10 being most severe.  Patient endorses 3 witnessed depressive episodes every day.  Patient endorses the following passive symptoms: feelinggs of sadness, lack of motivation, decreased concentration, decreased energy, worthlessness, and hopelessness.  Patient denies irritability and denies excessive worrying stating that he has no energy to worry.  Patient reports that he has been on all the SSRI type antidepressants as well as Wellbutrin.  Patient also endorses a past history of Adderall and Ritalin use.  Patient states that Ritalin was most effective in managing his symptoms of ADHD and states that he last took the medication years  ago.  Patient denies anxiety stating that he does not have enough energy to feel anxious.  Patient endorses a past history of hospitalization due to mental health stating that he was last hospitalized years ago.  Patient denies a past history of suicide attempt.  Patient states that he has been diagnosed with ADHD/ADD in the past but states that his diagnosis was from years ago at [facility].  Patient denies having any documents that shows proof of his ADHD diagnosis.  A PHQ-9 screen was performed with the patient scoring a 21.  A GAD-7 screen was also performed with the patient scoring a 10.  Patient is alert and oriented x 4 with cooperative, and fully engaged in conversation during the encounter.  Patient describes his mood as numb.  Patient denies suicidal or homicidal ideations.  He further denies auditory or visual hallucinations and does not appear to be responding to internal/external stimuli.  Patient denies paranoia or delusional thoughts.  Patient endorses receiving too much sleep.  Patient endorses good appetite and eats on average 3 meals per day.  Patient denied alcohol use, tobacco use, or illicit drug use.  Associated Signs/Symptoms: Depression Symptoms:  depressed mood, anhedonia, hypersomnia, fatigue, feelings of worthlessness/guilt, difficulty concentrating, hopelessness, loss of energy/fatigue, (Hypo) Manic Symptoms:  Distractibility, Financial Extravagance, Anxiety Symptoms:   Patient denies Psychotic Symptoms:   Patient denies PTSD Symptoms: Had a traumatic exposure:  Patient reports that he thinks everything is traumatic. Patient states that the world is horrible today. Had a traumatic exposure in the last month:  N/A Re-experiencing:  Flashbacks Intrusive Thoughts Hypervigilance:  No Hyperarousal:  Emotional Numbness/Detachment Avoidance:  Decreased Interest/Participation  Past Psychiatric History:  Patient endorses the following psychiatric illnesses:  ADHD, major depressive disorder, and generalized depression  Patient states that his last hospitalization occurred several years ago  Patient denies a past history of suicide attempts  Patient denies a past history of homicide attempts  Previous Psychotropic Medications: Yes , patient reports that he has been on all of the SSRI antidepressants.  Patient has also been on Wellbutrin, Adderall, and Ritalin  Substance Abuse History in the last 12 months:  No.  Consequences of Substance Abuse: Negative  Past Medical History:  Past Medical History:  Diagnosis Date   ADD (attention deficit disorder)    ADD (attention deficit disorder)    Allergy    Anemia, iron deficiency    Anxiety    Asthma    Blood transfusion    Constipation    Depression    Depression    Exertional dyspnea    secondary to anemia   GERD (gastroesophageal reflux disease)    Hiatal hernia    Narcolepsy    pt also reports episodes of passing out that are unrelated to narcolepsy about a year ago   Sleep apnea 01/2018   has a CPAP, "unable to use it"    Past Surgical History:  Procedure Laterality Date   COLONOSCOPY     UPPER GASTROINTESTINAL ENDOSCOPY      Family Psychiatric History:  Patient denies a family history of psychiatric illness  Family history of suicide attempt: Patient is not aware Family history of homicide attempts: Patient is not aware Family history of substance abuse: Patient is unaware  Family History:  Family History  Problem Relation Age of Onset   Diabetes Paternal Grandfather    Cancer Neg Hx    Stroke Neg Hx    Breast cancer Neg Hx    Celiac disease Neg Hx    Cirrhosis Neg Hx    Clotting disorder Neg Hx    Colitis Neg Hx    Colon cancer Neg Hx    Colon polyps Neg Hx    Crohn's disease Neg Hx    Cystic fibrosis Neg Hx    Esophageal cancer Neg Hx    Heart disease Neg Hx    Hemochromatosis Neg Hx    Inflammatory bowel disease Neg Hx    Irritable bowel syndrome Neg Hx     Kidney disease Neg Hx    Liver cancer Neg Hx    Liver disease Neg Hx    Ovarian cancer Neg Hx    Pancreatic cancer Neg Hx    Prostate cancer Neg Hx    Rectal cancer Neg Hx    Stomach cancer Neg Hx    Ulcerative colitis Neg Hx    Uterine cancer Neg Hx    Wilson's disease Neg Hx     Social History:   Social History   Socioeconomic History   Marital status: Single    Spouse name: Not on file   Number of children: 0   Years of education: Not on file   Highest education level: Not on file  Occupational History   Occupation: student-Business  Tobacco Use   Smoking status: Never   Smokeless tobacco: Never  Vaping Use   Vaping status: Never Used  Substance and Sexual Activity   Alcohol use: No   Drug use: No   Sexual activity: Not Currently  Other Topics Concern   Not on file  Social History Narrative   Lives alone. Never married, no  children.  Has two sister who live in [place].  Student at work study.  [employer].  Working toward Chief Technology Officer.  Previously worked in Geophysicist/field seismologist.  Was also previously homeless due to job loss and psychiatric exacerbation.   Social Determinants of Health   Financial Resource Strain: High Risk (03/31/2023)   Overall Financial Resource Strain (CARDIA)    Difficulty of Paying Living Expenses: Very hard  Food Insecurity: Food Insecurity Present (03/31/2023)   Hunger Vital Sign    Worried About Running Out of Food in the Last Year: Often true    Ran Out of Food in the Last Year: Often true  Transportation Needs: No Transportation Needs (02/13/2022)   PRAPARE - Administrator, Civil Service (Medical): No    Lack of Transportation (Non-Medical): No  Physical Activity: Inactive (03/31/2023)   Exercise Vital Sign    Days of Exercise per Week: 0 days    Minutes of Exercise per Session: 0 min  Stress: Stress Concern Present (03/31/2023)   [employer] of Occupational Health - Occupational Stress  Questionnaire    Feeling of Stress : Very much  Social Connections: Socially Isolated (03/31/2023)   Social Connection and Isolation Panel [NHANES]    Frequency of Communication with Friends and Family: Never    Frequency of Social Gatherings with Friends and Family: Never    Attends Religious Services: More than 4 times per year    Active Member of [employer] or Organizations: No    Attends Banker Meetings: Never    Marital Status: Never married    Additional Social History:  Patient denies social support.  Patient denies having children of his own.  Patient denies housing.  Patient is currently unemployed.  Patient denies a past history of military experience.  Patient endorses a past history of prison/jail time.  Highest education earned by the patient is his bachelor's degree.  Patient denies access to weapons.  Allergies:  No Known Allergies  Metabolic Disorder Labs: Lab Results  Component Value Date   HGBA1C 5.8 (H) 02/02/2023   No results found for: "PROLACTIN" Lab Results  Component Value Date   CHOL 181 02/02/2023   TRIG 86 02/02/2023   HDL 51 02/02/2023   CHOLHDL 3.5 02/02/2023   VLDL 26 05/29/2014   LDLCALC 114 (H) 02/02/2023   LDLCALC 116 (H) 03/14/2021   Lab Results  Component Value Date   TSH 1.610 12/19/2021    Therapeutic Level Labs: No results found for: "LITHIUM" No results found for: "CBMZ" No results found for: "VALPROATE"  Current Medications: Current Outpatient Medications  Medication Sig Dispense Refill   venlafaxine XR (EFFEXOR-XR) 37.5 MG 24 hr capsule Take 1 capsule (37.5 mg total) by mouth daily with breakfast. 30 capsule 1   albuterol (VENTOLIN HFA) 108 (90 Base) MCG/ACT inhaler Inhale 1-2 puffs into the lungs every 6 (six) hours as needed for wheezing or shortness of breath. (Patient not taking: Reported on 04/23/2023) 6.7 g 3   pantoprazole (PROTONIX) 40 MG tablet Take 1 tablet (40 mg total) by mouth daily. 90 tablet 1    umeclidinium-vilanterol (ANORO ELLIPTA) 62.5-25 MCG/ACT AEPB Inhale 1 puff into the lungs daily. (Patient not taking: Reported on 04/23/2023) 90 each 4   No current facility-administered medications for this visit.    Musculoskeletal: Strength & Muscle Tone: within normal limits Gait & Station: normal Patient leans: N/A  Psychiatric Specialty Exam: Review of Systems  Psychiatric/Behavioral:  Positive for decreased concentration, dysphoric mood  and sleep disturbance. Negative for hallucinations, self-injury and suicidal ideas. The patient is nervous/anxious. The patient is not hyperactive.     Blood pressure 136/85, pulse 75, temperature 97.9 F (36.6 C), temperature source Oral, height 5\' 11"  (1.803 m), weight 295 lb 9.6 oz (134.1 kg), SpO2 98%.Body mass index is 41.23 kg/m.  General Appearance: Casual  Eye Contact:  Good  Speech:  Clear and Coherent and Normal Rate  Volume:  Normal  Mood:  Depressed and Dysphoric  Affect:  Congruent  Thought Process:  Coherent, Goal Directed, and Descriptions of Associations: Intact  Orientation:  Full (Time, Place, and Person)  Thought Content:  WDL  Suicidal Thoughts:  No  Homicidal Thoughts:  No  Memory:  Immediate;   Good Recent;   Good Remote;   Fair  Judgement:  Good  Insight:  Good  Psychomotor Activity:  Normal  Concentration:  Concentration: Good and Attention Span: Good  Recall:  Good  Fund of Knowledge:Good  Language: Good  Akathisia:  No  Handed:  Right  AIMS (if indicated):  not done  Assets:  Communication Skills Desire for Improvement Housing Transportation  ADL's:  Intact  Cognition: WNL  Sleep:  Fair   Screenings: GAD-7    Flowsheet Row Office Visit from 05/13/2023 in [facility] Counselor from 03/31/2023 in [facility]  Total GAD-7 Score 10 18      PHQ2-9    Flowsheet Row Office Visit from 05/13/2023 in [facility] Office  Visit from 04/23/2023 in [facility] Counselor from 03/31/2023 in [facility] Office Visit from 03/12/2023 in [facility] Office Visit from 02/17/2023 in [facility]  PHQ-2 Total Score 6 6 6 6 6   PHQ-9 Total Score 21 20 20 20 21       Flowsheet Row Office Visit from 05/13/2023 in [facility] Counselor from 03/31/2023 in [facility] from 01/29/2022 in [facility]  C-SSRS RISK CATEGORY No Risk No Risk No Risk       Assessment and Plan:   [name] is a 57 year old male with a past psychiatric history significant for attention deficit hyperactivity disorder and major depressive disorder who presents to [facility] to establish psychiatric care and for medication management.  Patient presents today encounter endorsing worsening depression that has been going on for several years.  Patient is not currently on any medications but has been on all the SSRI antidepressants as well as Wellbutrin.  Patient also reports that he has been Adderall and Ritalin and states that Ritalin has been most effective in managing his ADD/ADHD symptoms.  Patient reports that he last used Ritalin years ago.  In regards to his diagnosis of ADHD/ADD, patient reports that he was assessed several years ago at [facility] but does not have any documents related to his past diagnosis.  Provider recommended patient be placed on Effexor XR 37.5 mg daily for the management of his depressive symptoms.  Patient was agreeable to recommendation.  Provider informed patient that he would not be placed on any stimulants at the conclusion of the encounter due to lack of documents proving his ADD/ADHD diagnosis.  Patient was provided a resource for ADHD assessment.  Patient vocalized  understanding.  Collaboration of Care: Medication Management AEB provider managing patient's psychiatric medications, Primary Care Provider  AEB patient being seen by a family medicine provider, Psychiatrist AEB patient being followed by mental health provider at this facility, and Other provider involved in patient's care AEB patient being seen by oncology and pulmonology  Patient/Guardian was advised Release of Information must be obtained prior to any record release in order to collaborate their care with an outside provider. Patient/Guardian was advised if they have not already done so to contact the registration department to sign all necessary forms in order for [place] to release information regarding their care.   Consent: Patient/Guardian gives verbal consent for treatment and assignment of benefits for services provided during this visit. Patient/Guardian expressed understanding and agreed to proceed.   1. Severe episode of recurrent major depressive disorder, without psychotic features (HCC)  - venlafaxine XR (EFFEXOR-XR) 37.5 MG 24 hr capsule; Take 1 capsule (37.5 mg total) by mouth daily with breakfast.  Dispense: 30 capsule; Refill: 1  2. GAD (generalized anxiety disorder)  - venlafaxine XR (EFFEXOR-XR) 37.5 MG 24 hr capsule; Take 1 capsule (37.5 mg total) by mouth daily with breakfast.  Dispense: 30 capsule; Refill: 1  Patient to follow up in 6 weeks Provider spent a total of 42 minutes with the patient/reviewing patient's chart  Meta Hatchet, PA 9/5/20242:42 PM

## 2023-05-14 ENCOUNTER — Other Ambulatory Visit (HOSPITAL_COMMUNITY): Payer: Self-pay

## 2023-05-19 ENCOUNTER — Telehealth: Payer: Self-pay

## 2023-05-19 NOTE — Progress Notes (Signed)
I reached out to his emergency contact Natasha Mead and she confirmed that Ramez is still at [facility]. She said he has once a month visitor privileges and she will ask him next time she see's him if he has an appointment with Dr Norwood Levo.  I told Natasha Mead I will mail a letter to [facility] for them to please call us and set up an appointment for Molly Maduro to see Dr Norwood Levo for his nausea. Today's date is 05/19/2023.

## 2023-05-19 NOTE — Telephone Encounter (Signed)
Letter mailed to [facility] for them to please contact us to set up Jeffery Maduro an appointment with Dr [name] for his nausea, referred from oncology.

## 2023-05-20 ENCOUNTER — Telehealth: Payer: Self-pay

## 2023-05-20 NOTE — Telephone Encounter (Signed)
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 Nothing further needs to be done. ----- Message ----- From: Interface, Rad Results In Sent: 05/04/2023   5:29 PM EDT To: Briant Cedar, PA-C

## 2023-05-20 NOTE — Telephone Encounter (Signed)
LM at [facility] [phone] for [name] to return my call

## 2023-05-21 ENCOUNTER — Telehealth: Payer: Self-pay

## 2023-05-21 NOTE — Telephone Encounter (Signed)
Many attempts have been made to contact pt. Telephone, My Chart and messages have been left at the facility where he is currently residing. [facility] Knife [phone] with no success.

## 2023-05-21 NOTE — Telephone Encounter (Signed)
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 Nothing further needs to be done. ----- Message ----- From: Interface, Rad Results In Sent: 05/04/2023   5:29 PM EDT To: Briant Cedar, PA-C

## 2023-05-25 NOTE — Progress Notes (Unsigned)
@Patient  ID: [name], male    DOB: May 17, 1966, 57 y.o.   MRN: [id number]  Chief Complaint  Patient presents with   Follow-up    Wearing CPAP-wears 2-3 hrs. Each night and then not able to go back asleep,increased sob,denies cough and wheeze    Referring provider: [name], MD  HPI: 57 year old male, never smoked. PMH significant for OSA, asthma, GERD, ADHD, iron deficiency anemia, morbid obesity.   Previous [facility] pulmonary encounter:  01/12/2023 Patient presents today for for overdue OSA follow-up.  He has severe sleep apnea. Unable to afford CPAP and most medications. [facility]. He was able to get a used CPAP machine from a friend. He has a [employer]peronics system one. Apply for patient assistance for Anoro.   02/23/2023 Patient presents today for follow-up.  Patient has a history of severe obstructive sleep apnea.  He is homeless, [facility].  He was able to get a CPAP machine from a friend.  During her last visit advised patient bring current CPAP machine by medical supply store to see if were able to use machine, pressure settings likely needed to be adjusted and device registered to him along with having mask fitting.  He is wearing CPAP nightly. He feels pressure is too low. He has not seen a clinical benefit from use. He remains extremely tired. In the past during titration studies he did notice benefit from use. 2014 titration optimal pressure 14cm. He has received CPAP supplies from [employer]. SD card provided, our office does not have a reader to access compliance report- he will need to bring by [employer] for download.   He is receiving Anoro from patient assistance. Feels it is not working well. Having more difficulty taking a deep breath. Denies overt wheezing or chest tightness.    04/07/2023- Interim hx  Patient presents today for 4-6 week follow-up. He has severe obstructive sleep apnea, AHI  57.1 an hour.  Patient was unable to afford CPAP but was able to acquire a used machine [employer] system one machine).  That particular CPAP was set to a pressure of 10 cm H20, unable to do auto settings.  During our last visit patient felt pressure was not strong enough and was still experiencing daytime fatigue.  Pressure was increased to 15 cm H2O.  DME order was placed to establish with [facility] for CPAP supplies.  Instructed to bring SD card by DME company for compliance download.  He experiences dyspnea with exertion, has difficulty taking a fell deep breath. PFTs suggestive of mild asthma with air trapping and increased DLCO. Breo caused weight gain. He had been taking ANORO for some time and getting through patient assistance. Given a sample of Trelegy until receiving PA.  Pressure change is better. He is wearing CPAP nightly but only for about 2-3 hours due to airleaks.  The first time he used CPAP he did not wake up with headache. He has no trouble sleeping. He is tired all the time. He has been prescribed medication in the past for hypersomnia. He tells me he has narcolepsy symptoms. He can not takes scheduled II medication. He is unsure he will be allowed to have sleep study.    Sleep testing:  NPSG 04/30/14- positive for moderate obstructive sleep apnea, AHI 16.9 per hour with moderate snoring and desaturation to 86%. CPAP titrated to 14 CWP  01/02/2018-sleep study- severe obstructive sleep apnea  with an AHI of 57.1  OSA (obstructive sleep apnea) - PSG 01/03/23 >> severe OSA, AHI 57.1/hour. Patient is homeless/[facility]. He was unable to afford CPAP. Received a used [employer] One CPAP from a third party. He reports compliance with use, only getting about 2-3 hours of usage. Continues to report excessive daytime sleepiness along with some narcolepsy symptoms. Current pressure 15cm h20. Getting compliance download has been a challenge, he  physically needs to bring machine by [facility] in [place] for data to be pulled.  We will order CPAP titration study along with MSLT due to persistent daytime sleepiness. Advised patient aim to wear CPAP nightly 4-6 hours or longer. DME company is [employer]. Recommend patient establish with Dr. Maple Hudson to discuss medications for hypersomnia after testing has been completed (unclear if he can take scheduled II mediations while in program)  Asthma - Stable; No acute respiratory symptoms. PFTs suggestive of mild asthma with air trapping and increased DLCO. Breo caused weight gain. He was switched to LABA/LAMA in JUNE 2023. Receiving Anoro through patient assistance. He is currently out of medication and needs new prescription. Unfortunately we did not have samples of ANORO today so he was provided a sample of Trelegy until he can get medication through patient assistance.   Jeffery Bayley, NP 04/07/2023  05/27/23- 45 yoM never smoker,  PMH significant for OSA, asthma, GERD, ADHD, iron deficiency anemia, morbid obesity.  Living in homeless shelter, using borrowed CPAP. He says [facility] to put an SD card in his old CPAP machine, but neither that [facility] office or our reader here could download his card.  [employer] told him he needed to take his machine out to their office on [facility] but he does not have a way to get there.  I suggested he take his machine back to the [employer] office that he was able to reach, they gave him the card, and asked them to try a different card.  It is possible his machine is too old to interface with this generation of SD cards. He indicates that he is using his CPAP machine and that it prevents morning headache which used to be a problem. He is pending CPAP titration and MSLT on October 8.  In reviewing medications for that, he tells me he is no longer taking Effexor.  He is not on any antidepressants or sleep meds. His primary care provider manages his asthma.  He is to get  in touch with them to get renewal of manufacturer's assistance for his Anoro inhaler.  He avoids any inhaler with a ICS, saying they caused him to gain weight.  ROS-see HPI   + = positive Constitutional:    weight loss, night sweats, fevers, chills, fatigue, lassitude. HEENT:    headaches, difficulty swallowing, tooth/dental problems, sore throat,       sneezing, itching, ear ache, nasal congestion, post nasal drip, snoring CV:    chest pain, orthopnea, PND, swelling in lower extremities, anasarca,            dizziness, palpitations Resp:   shortness of breath with exertion or at rest.                productive cough,   non-productive cough, coughing up of blood.              change in color of mucus.  wheezing.   Skin:    rash or lesions. GI:  No-  heartburn, indigestion, abdominal pain, nausea, vomiting, diarrhea,                 change in bowel habits, loss of appetite GU: dysuria, change in color of urine, no urgency or frequency.   flank pain. MS:   joint pain, stiffness, decreased range of motion, back pain. Neuro-     nothing unusual Psych:  change in mood or affect.  depression or anxiety.   memory loss.  OBJ- Physical Exam General- Alert, Oriented, Affect-appropriate, Distress- none acute, +obese Skin- rash-none, lesions- none, excoriation- none Lymphadenopathy- none Head- atraumatic            Eyes- Gross vision intact, PERRLA, conjunctivae and secretions clear            Ears- Hearing, canals-normal            Nose- Clear, no-Septal dev, mucus, polyps, erosion, perforation             Throat- Mallampati II , mucosa clear , drainage- none, tonsils- atrophic Neck- flexible , trachea midline, no stridor , thyroid nl, carotid no bruit Chest - symmetrical excursion , unlabored           Heart/CV- RRR , no murmur , no gallop  , no rub, nl s1 s2                           - JVD- none , edema- none, stasis changes- none, varices- none           Lung- clear to P&A, wheeze- none,  cough- none , dullness-none, rub- none           Chest wall-  Abd-  Br/ Gen/ Rectal- Not done, not indicated Extrem- cyanosis- none, clubbing, none, atrophy- none, strength- nl Neuro- grossly intact to observation

## 2023-05-26 ENCOUNTER — Other Ambulatory Visit: Payer: Self-pay

## 2023-05-26 ENCOUNTER — Telehealth: Payer: Self-pay

## 2023-05-26 NOTE — Patient Outreach (Signed)
Medicaid Managed Care   Unsuccessful Outreach Note  05/26/2023 Name: [name] MRN: [id number] DOB: [birth year]  Referred by: [name], MD Reason for referral : High Risk Managed Medicaid (MM Social work unsuccessful telephone outreach )   An unsuccessful telephone outreach was attempted today. The patient was referred to the case management team for assistance with care management and care coordination.   Follow Up Plan: The patient has been provided with contact information for the care management team and has been advised to call with any health related questions or concerns.   Abelino Derrick, MHA [facility]  Managed [facility] Social Worker [phone]

## 2023-05-26 NOTE — Patient Outreach (Signed)
Patient returned BSW call and left a voicemail, Made 3 attempts to contact patient back and kept getting busy signal.

## 2023-05-26 NOTE — Telephone Encounter (Signed)
Patient calls nurse line checking the status of medication assistance for Anoro.   I see previous message from 4/15 patient has been approved through 01/14/2024.  Will forward to pharmacy team for shipment updates.

## 2023-05-26 NOTE — Patient Outreach (Signed)
Medicaid Managed Care Social Work Note  05/26/2023 Name:  [name] MRN:  [id number] DOB:  October 20, 1965  [name] is an 57 y.o. year old male who is a primary patient of [name], MD.  The Medicaid Managed Care Coordination team was consulted for assistance with:   housing , disability  [name] was given information about Medicaid Managed Care Coordination team services today. [name] Patient agreed to services and verbal consent obtained.  Engaged with patient  for by telephone forfollow up visit in response to referral for case management and/or care coordination services.   Assessments/Interventions:  Review of past medical history, allergies, medications, health status, including review of consultants reports, laboratory and other test data, was performed as part of comprehensive evaluation and provision of chronic care management services.  SDOH: (Social Determinant of Health) assessments and interventions performed: SDOH Interventions    Flowsheet Row Office Visit from 05/13/2023 in [facility] Counselor from 03/31/2023 in [facility] Chronic Care Management from 02/13/2022 in [facility] Office Visit from 12/19/2021 in [facility] Chronic Care Management from 05/06/2021 in [facility] Office Visit from 09/14/2017 in [facility]  SDOH Interventions        Food Insecurity Interventions -- -- Other (Comment)  [pt already connected with FNS,  is not interested in other resources] -- -- --  Housing Interventions -- -- Patient Refused  [pt is not interested in a shelter] -- -- --  Transportation Interventions -- -- -- -- Retail banker --  Depression Interventions/Treatment  Counseling, Medication -- -- Referral to Psychiatry -- Currently on Treatment  Financial Strain Interventions -- Artist -- --  [id number] Referral --      BSW completed a telephone outreach with patient, he states he has not heard anything from the [facility]. BSW informed she received a message from [place] from [facility] stating that his diagnosis would not qualify him for disability. Patient states he spoke with [name] and she stated he is eligible for reconsideration. BSW contacted the [facility] but did not get an answer. Patient states he did not do an application for the housing waitlist, Patient did provide BSW permission to place him on the housing waitlist due to him having no way to complete. BSW went add patient to waitlist and the list is closed for individuals under 62 but open for 62 and up.   Advanced Directives Status:  Not addressed in this encounter.  Care Plan                 No Known Allergies  Medications Reviewed Today   Medications were not reviewed in this encounter     Patient Active Problem List   Diagnosis Date Noted   GAD (generalized anxiety disorder) 03/31/2023   Paresthesia 03/12/2023   Hyperlipidemia 02/17/2023   Ankle swelling 02/02/2023   Orbital swelling 02/02/2023   Social problem 12/18/2022   Elevated BP without diagnosis of hypertension 02/10/2022   Asthma 12/19/2021   Renal cyst 12/17/2021   Homelessness 03/14/2021   Morbid obesity (HCC) 03/14/2021   Amnesia 02/09/2020   Hypogonadism in male 09/14/2017   OSA (obstructive sleep apnea) 05/29/2014   Iron deficiency anemia 12/22/2013   Chronic fatigue 07/07/2013   Adult ADHD 07/07/2013   Depression 01/07/2012   GERD (gastroesophageal reflux disease) 01/07/2012    Conditions to be addressed/monitored per  PCP order:   disability and housing  There are no care plans that you recently modified to display for this patient.   Follow up:  Patient agrees to Care Plan and Follow-up.  Plan: The Managed Medicaid care management team will reach out to the patient again over the next 30 days.  Date/time of  next scheduled Social Work care management/care coordination outreach:  06/25/23  [name], [name], [facility]  Managed [facility] Social Worker [phone]

## 2023-05-26 NOTE — Patient Instructions (Addendum)
Medicaid Managed Care   Unsuccessful Outreach Note  05/26/2023 Name: TAWFIQ BOQUIST MRN: [id number] DOB: 03-Jul-1966  Referred by: [name], MD Reason for referral : High Risk Managed Medicaid (MM Social work unsuccessful telephone outreach )   An unsuccessful telephone outreach was attempted today. The patient was referred to the case management team for assistance with care management and care coordination.   Follow Up Plan: The patient has been provided with contact information for the care management team and has been advised to call with any health related questions or concerns.   [name], [name], MHA [facility]  Managed Medicaid Social Worker [phone] Visit Information  The Patient                                              was given information about Medicaid Managed Care team care coordination services and consented to engagement with the [facility] Managed Care team.   Social Worker will follow up on 06/25/23.   Abelino Derrick, MHA [facility]  Managed [facility] Social Worker [phone]

## 2023-05-27 ENCOUNTER — Encounter: Payer: Self-pay | Admitting: Internal Medicine

## 2023-05-27 ENCOUNTER — Ambulatory Visit (INDEPENDENT_AMBULATORY_CARE_PROVIDER_SITE_OTHER): Payer: Self-pay | Admitting: Internal Medicine

## 2023-05-27 VITALS — BP 128/80 | HR 97 | Temp 97.3°F | Ht 71.0 in | Wt 296.8 lb

## 2023-05-27 DIAGNOSIS — G4733 Obstructive sleep apnea (adult) (pediatric): Secondary | ICD-10-CM

## 2023-05-27 NOTE — Assessment & Plan Note (Signed)
He indicates compliance and benefit from his current (old) CPAP machine. Plan-he will try to get help from [employer] to get a readable SD card and meanwhile he will go forward with planned CPAP titration and MSLT sleep studies.

## 2023-05-27 NOTE — Assessment & Plan Note (Signed)
He indicates he is very sensitive to inhaled corticosteroids which tend to cause rapid weight gain.  He is going to contact his primary care provider to get renewal of manufacturer's assistance for his Anoro inhaler.

## 2023-05-27 NOTE — Patient Instructions (Addendum)
Keep appointment for sleep studies next month  Reach out to your primary doctor to renew the manufacturer's assistance for your Anoro, before your asthma gets out of control this Fall and Winter.  Try going back to the [employer] office that gave you the SD card- if they can't read it for [place], maybe they can replace it with a different card that they can read.

## 2023-05-28 NOTE — Telephone Encounter (Signed)
Attempted mobile number, no VM. Attempted home number, not in service.  Called temporary number on file, I believe this is where he is currently residing. Left message requesting they have him give me a call back directly at [phone].

## 2023-06-08 ENCOUNTER — Other Ambulatory Visit: Payer: Self-pay | Admitting: Family Medicine

## 2023-06-08 ENCOUNTER — Ambulatory Visit (INDEPENDENT_AMBULATORY_CARE_PROVIDER_SITE_OTHER): Payer: Self-pay | Admitting: Family Medicine

## 2023-06-08 ENCOUNTER — Other Ambulatory Visit (HOSPITAL_COMMUNITY): Payer: Self-pay

## 2023-06-08 ENCOUNTER — Encounter: Payer: Self-pay | Admitting: Physician Assistant

## 2023-06-08 ENCOUNTER — Encounter: Payer: Self-pay | Admitting: Family Medicine

## 2023-06-08 VITALS — BP 149/90 | HR 88 | Wt 299.0 lb

## 2023-06-08 DIAGNOSIS — F329 Major depressive disorder, single episode, unspecified: Secondary | ICD-10-CM

## 2023-06-08 DIAGNOSIS — D509 Iron deficiency anemia, unspecified: Secondary | ICD-10-CM

## 2023-06-08 DIAGNOSIS — R030 Elevated blood-pressure reading, without diagnosis of hypertension: Secondary | ICD-10-CM

## 2023-06-08 LAB — POCT HEMOGLOBIN: Hemoglobin: 10.6 g/dL — AB (ref 11–14.6)

## 2023-06-08 MED ORDER — BUPROPION HCL ER (XL) 150 MG PO TB24
150.0000 mg | ORAL_TABLET | Freq: Every day | ORAL | 1 refills | Status: DC
Start: 2023-06-08 — End: 2023-08-27
  Filled 2023-06-08: qty 30, 30d supply, fill #0
  Filled 2023-07-22: qty 30, 30d supply, fill #1
  Filled 2023-08-21: qty 30, 30d supply, fill #2

## 2023-06-08 NOTE — Assessment & Plan Note (Signed)
I reviewed his Psych encounter note Effexor started at the visit He prefers not to be on this medication and it has been discontinued Wellbutrin XL 150 mg every day escribed - to pick up today at the [facility] pharmacy with the indigent fund F/U in 4 weeks for reassessment F/U Psych as planned

## 2023-06-08 NOTE — Assessment & Plan Note (Signed)
His BP had been fine for a while ?? Recent stressor of living in a group home He has also gained a few more pounds which might be contributory Continue lifestyle modification for now Monitor BP closely at home for about 2-3 weeks F/U appointment for reassessment made during this visit

## 2023-06-08 NOTE — Assessment & Plan Note (Signed)
I reviewed and discussed his recently lab with him Ferritin remains low POC hemoglobin today is 10 which is baseline for him Feraheme ordered and we helped him schedule his iron infusion today F/U with cancer center as planned

## 2023-06-08 NOTE — Patient Instructions (Signed)
Blood Pressure Record Sheet To take your blood pressure, you will need a blood pressure machine. You may be prescribed one, or you can buy a blood pressure machine (blood pressure monitor) at your clinic, drug store, or online. When choosing one, look for these features: An automatic monitor that has an arm cuff. A cuff that wraps snugly, but not too tightly, around your upper arm. You should be able to fit only one finger between your arm and the cuff. A device that stores blood pressure reading results. Do not choose a monitor that measures your blood pressure from your wrist or finger. Follow your health care provider's instructions for how to take your blood pressure. To use this form: Get one reading in the morning (a.m.) before you take any medicines. Get one reading in the evening (p.m.) before supper. Take at least two readings with each blood pressure check. This makes sure the results are correct. Wait 1-2 minutes between measurements. Write down the results in the spaces on this form. Repeat this once a week, or as told by your health care provider. Make a follow-up appointment with your health care provider to discuss the results. Blood pressure log Date: _______________________ a.m. _____________________(1st reading) _____________________(2nd reading) p.m. _____________________(1st reading) _____________________(2nd reading) Date: _______________________ a.m. _____________________(1st reading) _____________________(2nd reading) p.m. _____________________(1st reading) _____________________(2nd reading) Date: _______________________ a.m. _____________________(1st reading) _____________________(2nd reading) p.m. _____________________(1st reading) _____________________(2nd reading) Date: _______________________ a.m. _____________________(1st reading) _____________________(2nd reading) p.m. _____________________(1st reading) _____________________(2nd reading) Date:  _______________________ a.m. _____________________(1st reading) _____________________(2nd reading) p.m. _____________________(1st reading) _____________________(2nd reading) This information is not intended to replace advice given to you by your health care provider. Make sure you discuss any questions you have with your health care provider. Document Revised: 05/08/2021 Document Reviewed: 05/08/2021 Elsevier Patient Education  2024 [employer].

## 2023-06-08 NOTE — Progress Notes (Signed)
    SUBJECTIVE:   CHIEF COMPLAINT / HPI:   Iron deficiency anemia: He endorsed worsening fatigue. No form of bleeding. He wants to check his hemoglobin again to ensure it has not dropped further.  Depression: He was recently started on Effexor by the Psychiatrist. However, he never picked up this medication. He prefers to go back on Wellbutrin, which he did well on in the past.   Elevated BP: No prior hx of HTN. No cardiopulmonary symptoms or neurologic symptoms currently.  PERTINENT  PMH / PSH: PMHx reviewed  OBJECTIVE:   BP (!) 149/90   Pulse 88   Wt 299 lb (135.6 kg)   SpO2 98%   BMI 41.70 kg/m   Physical Exam Vitals and nursing note reviewed.  Cardiovascular:     Rate and Rhythm: Normal rate and regular rhythm.     Heart sounds: Normal heart sounds. No murmur heard. Pulmonary:     Effort: Pulmonary effort is normal. No respiratory distress.     Breath sounds: Normal breath sounds. No wheezing.      ASSESSMENT/PLAN:   Iron deficiency anemia I reviewed and discussed his recently lab with him Ferritin remains low POC hemoglobin today is 10 which is baseline for him Feraheme ordered and we helped him schedule his iron infusion today F/U with cancer center as planned  Depression I reviewed his Psych encounter note Effexor started at the visit He prefers not to be on this medication and it has been discontinued Wellbutrin XL 150 mg every day escribed - to pick up today at the [facility] pharmacy with the indigent fund F/U in 4 weeks for reassessment F/U Psych as planned  Elevated BP without diagnosis of hypertension His BP had been fine for a while ?? Recent stressor of living in a group home He has also gained a few more pounds which might be contributory Continue lifestyle modification for now Monitor BP closely at home for about 2-3 weeks F/U appointment for reassessment made during this visit   Both Influenza and COVID-19 vaccinations were offered,  but he declined both today.  Janit Pagan, MD [facility]

## 2023-06-08 NOTE — Patient Outreach (Signed)
BSW contacted the [facility] to see what patient can be reconsidered for BSW left a voicemail for [employer].   Abelino Derrick, MHA [facility]  Managed [facility] Social Worker [phone]

## 2023-06-15 ENCOUNTER — Ambulatory Visit (HOSPITAL_BASED_OUTPATIENT_CLINIC_OR_DEPARTMENT_OTHER): Payer: Self-pay | Admitting: Internal Medicine

## 2023-06-16 ENCOUNTER — Encounter (HOSPITAL_BASED_OUTPATIENT_CLINIC_OR_DEPARTMENT_OTHER): Payer: Self-pay | Admitting: Internal Medicine

## 2023-06-21 ENCOUNTER — Encounter (HOSPITAL_COMMUNITY): Payer: Self-pay

## 2023-06-21 ENCOUNTER — Non-Acute Institutional Stay (HOSPITAL_COMMUNITY)
Admission: RE | Admit: 2023-06-21 | Discharge: 2023-06-21 | Disposition: A | Discharge status: Home / Self Care | Payer: Medicaid Other | Source: Ambulatory Visit | Attending: Internal Medicine | Admitting: Internal Medicine

## 2023-06-21 DIAGNOSIS — D509 Iron deficiency anemia, unspecified: Secondary | ICD-10-CM | POA: Insufficient documentation

## 2023-06-21 MED ORDER — SODIUM CHLORIDE 0.9 % IV SOLN: INTRAVENOUS | Status: DC | PRN

## 2023-06-21 MED ORDER — SODIUM CHLORIDE 0.9 % IV SOLN
510.0000 mg | Freq: Once | INTRAVENOUS | Status: AC
  Administered 2023-06-21: 510 mg via INTRAVENOUS
  Filled 2023-06-21: qty 17

## 2023-06-21 NOTE — Progress Notes (Signed)
PATIENT CARE CENTER NOTE  Diagnosis: Iron deficiency anemia, unspecified iron deficiency anemia type [D50.9]    Provider: [name], MD   Procedure: Feraheme 510 mg (dose #1 of 1)  Note: Patient received Feraheme 510 mg via PIV. No pre-medications per orders. Pt tolerated infusion with no adverse reaction. Pt observed for 30 minutes post infusion. Vital signs are stable. AVS printed and given to pt. Pt is alert, oriented, and ambulatory at discharge.

## 2023-06-22 ENCOUNTER — Inpatient Hospital Stay: Payer: Medicaid Other

## 2023-06-23 ENCOUNTER — Inpatient Hospital Stay: Payer: Medicaid Other | Attending: Physician Assistant

## 2023-06-23 DIAGNOSIS — K922 Gastrointestinal hemorrhage, unspecified: Secondary | ICD-10-CM | POA: Diagnosis present

## 2023-06-23 DIAGNOSIS — D500 Iron deficiency anemia secondary to blood loss (chronic): Secondary | ICD-10-CM | POA: Diagnosis present

## 2023-06-23 LAB — CBC WITH DIFFERENTIAL (CANCER CENTER ONLY)
Abs Immature Granulocytes: 0.08 10*3/uL — ABNORMAL HIGH (ref 0.00–0.07)
Basophils Absolute: 0.1 10*3/uL (ref 0.0–0.1)
Basophils Relative: 1 %
Eosinophils Absolute: 0.5 10*3/uL (ref 0.0–0.5)
Eosinophils Relative: 5 %
HCT: 34.9 % — ABNORMAL LOW (ref 39.0–52.0)
Hemoglobin: 11 g/dL — ABNORMAL LOW (ref 13.0–17.0)
Immature Granulocytes: 1 %
Lymphocytes Relative: 11 %
Lymphs Abs: 1.1 10*3/uL (ref 0.7–4.0)
MCH: 23.5 pg — ABNORMAL LOW (ref 26.0–34.0)
MCHC: 31.5 g/dL (ref 30.0–36.0)
MCV: 74.4 fL — ABNORMAL LOW (ref 80.0–100.0)
Monocytes Absolute: 0.7 10*3/uL (ref 0.1–1.0)
Monocytes Relative: 7 %
Neutro Abs: 7.2 10*3/uL (ref 1.7–7.7)
Neutrophils Relative %: 75 %
Platelet Count: 371 10*3/uL (ref 150–400)
RBC: 4.69 MIL/uL (ref 4.22–5.81)
RDW: 15.7 % — ABNORMAL HIGH (ref 11.5–15.5)
WBC Count: 9.7 10*3/uL (ref 4.0–10.5)
nRBC: 0 % (ref 0.0–0.2)

## 2023-06-23 LAB — SAMPLE TO BLOOD BANK

## 2023-06-23 LAB — IRON AND IRON BINDING CAPACITY (CC-WL,HP ONLY)
Iron: 482 ug/dL — ABNORMAL HIGH (ref 45–182)
Saturation Ratios: 110 % — ABNORMAL HIGH (ref 17.9–39.5)
TIBC: 437 ug/dL (ref 250–450)
UIBC: UNDETERMINED ug/dL (ref 117–376)

## 2023-06-23 LAB — FERRITIN: Ferritin: 117 ng/mL (ref 24–336)

## 2023-06-24 ENCOUNTER — Other Ambulatory Visit: Payer: Self-pay | Admitting: Physician Assistant

## 2023-06-24 ENCOUNTER — Ambulatory Visit (INDEPENDENT_AMBULATORY_CARE_PROVIDER_SITE_OTHER): Payer: Medicaid Other | Admitting: Physician Assistant

## 2023-06-24 VITALS — BP 141/82 | HR 79 | Temp 97.5°F | Ht 71.0 in | Wt 295.6 lb

## 2023-06-24 DIAGNOSIS — F332 Major depressive disorder, recurrent severe without psychotic features: Secondary | ICD-10-CM

## 2023-06-24 DIAGNOSIS — D500 Iron deficiency anemia secondary to blood loss (chronic): Secondary | ICD-10-CM

## 2023-06-24 DIAGNOSIS — F411 Generalized anxiety disorder: Secondary | ICD-10-CM

## 2023-06-24 NOTE — Progress Notes (Unsigned)
BH MD/PA/NP OP Progress Note  06/25/2023 9:49 AM [name]  MRN:  [id number]  Chief Complaint:  Chief Complaint  Patient presents with   Follow-up   Medication Management   HPI: ***  [name]  Visit Diagnosis:    ICD-10-CM   1. Severe episode of recurrent major depressive disorder, without psychotic features (HCC)  F33.2     2. GAD (generalized anxiety disorder)  F41.1       Past Psychiatric History:  Patient endorses the following psychiatric illnesses: ADHD, major depressive disorder, and generalized depression   Patient states that his last hospitalization occurred several years ago   Patient denies a past history of suicide attempts   Patient denies a past history of homicide attempts  Past Medical History:  Past Medical History:  Diagnosis Date   ADD (attention deficit disorder)    ADD (attention deficit disorder)    Allergy    Anemia, iron deficiency    Anxiety    Asthma    Blood transfusion    Constipation    Depression    Depression    Exertional dyspnea    secondary to anemia   GERD (gastroesophageal reflux disease)    Hiatal hernia    Narcolepsy    pt also reports episodes of passing out that are unrelated to narcolepsy about a year ago   Sleep apnea 01/2018   has a CPAP, "unable to use it"    Past Surgical History:  Procedure Laterality Date   COLONOSCOPY     UPPER GASTROINTESTINAL ENDOSCOPY      Family Psychiatric History:  Patient denies a family history of psychiatric illness   Family history of suicide attempt: Patient is not aware Family history of homicide attempts: Patient is not aware Family history of substance abuse: Patient is unaware  Family History:  Family History  Problem Relation Age of Onset   Diabetes Paternal Grandfather    Cancer Neg Hx    Stroke Neg Hx    Breast cancer Neg Hx    Celiac disease Neg Hx    Cirrhosis Neg Hx    Clotting disorder Neg Hx    Colitis Neg Hx    Colon cancer Neg Hx     Colon polyps Neg Hx    Crohn's disease Neg Hx    Cystic fibrosis Neg Hx    Esophageal cancer Neg Hx    Heart disease Neg Hx    Hemochromatosis Neg Hx    Inflammatory bowel disease Neg Hx    Irritable bowel syndrome Neg Hx    Kidney disease Neg Hx    Liver cancer Neg Hx    Liver disease Neg Hx    Ovarian cancer Neg Hx    Pancreatic cancer Neg Hx    Prostate cancer Neg Hx    Rectal cancer Neg Hx    Stomach cancer Neg Hx    Ulcerative colitis Neg Hx    Uterine cancer Neg Hx    Wilson's disease Neg Hx     Social History:  Social History   Socioeconomic History   Marital status: Single    Spouse name: Not on file   Number of children: 0   Years of education: Not on file   Highest education level: Not on file  Occupational History   Occupation: student-Business  Tobacco Use   Smoking status: Never   Smokeless tobacco: Never  Vaping Use   Vaping status: Never Used  Substance and Sexual Activity  Alcohol use: No   Drug use: No   Sexual activity: Not Currently  Other Topics Concern   Not on file  Social History Narrative   Lives alone. Never married, no children.  Has two sister who live in [place].  Student at work study.  [employer].  Working toward Chief Technology Officer.  Previously worked in Geophysicist/field seismologist.  Was also previously homeless due to job loss and psychiatric exacerbation.   Social Determinants of Health   Financial Resource Strain: High Risk (03/31/2023)   Overall Financial Resource Strain (CARDIA)    Difficulty of Paying Living Expenses: Very hard  Food Insecurity: Food Insecurity Present (03/31/2023)   Hunger Vital Sign    Worried About Running Out of Food in the Last Year: Often true    Ran Out of Food in the Last Year: Often true  Transportation Needs: No Transportation Needs (02/13/2022)   PRAPARE - Administrator, Civil Service (Medical): No    Lack of Transportation (Non-Medical): No  Physical Activity: Inactive  (03/31/2023)   Exercise Vital Sign    Days of Exercise per Week: 0 days    Minutes of Exercise per Session: 0 min  Stress: Stress Concern Present (03/31/2023)   [employer] of Occupational Health - Occupational Stress Questionnaire    Feeling of Stress : Very much  Social Connections: Socially Isolated (03/31/2023)   Social Connection and Isolation Panel [NHANES]    Frequency of Communication with Friends and Family: Never    Frequency of Social Gatherings with Friends and Family: Never    Attends Religious Services: More than 4 times per year    Active Member of Clubs or Organizations: No    Attends Engineer, structural: Never    Marital Status: Never married    Allergies: No Known Allergies  Metabolic Disorder Labs: Lab Results  Component Value Date   HGBA1C 5.8 (H) 02/02/2023   No results found for: "PROLACTIN" Lab Results  Component Value Date   CHOL 181 02/02/2023   TRIG 86 02/02/2023   HDL 51 02/02/2023   CHOLHDL 3.5 02/02/2023   VLDL 26 05/29/2014   LDLCALC 114 (H) 02/02/2023   LDLCALC 116 (H) 03/14/2021   Lab Results  Component Value Date   TSH 1.610 12/19/2021   TSH 1.790 09/14/2017    Therapeutic Level Labs: No results found for: "LITHIUM" No results found for: "VALPROATE" No results found for: "CBMZ"  Current Medications: Current Outpatient Medications  Medication Sig Dispense Refill   albuterol (VENTOLIN HFA) 108 (90 Base) MCG/ACT inhaler Inhale 1-2 puffs into the lungs every 6 (six) hours as needed for wheezing or shortness of breath. (Patient not taking: Reported on 06/08/2023) 6.7 g 3   buPROPion (WELLBUTRIN XL) 150 MG 24 hr tablet Take 1 tablet (150 mg total) by mouth daily. 90 tablet 1   pantoprazole (PROTONIX) 40 MG tablet Take 1 tablet (40 mg total) by mouth daily. 90 tablet 1   umeclidinium-vilanterol (ANORO ELLIPTA) 62.5-25 MCG/ACT AEPB Inhale 1 puff into the lungs daily. (Patient not taking: Reported on 06/08/2023) 90 each 4   No  current facility-administered medications for this visit.     Musculoskeletal: Strength & Muscle Tone: within normal limits Gait & Station: normal Patient leans: N/A  Psychiatric Specialty Exam: Review of Systems  Psychiatric/Behavioral:  Positive for decreased concentration, dysphoric mood and sleep disturbance. Negative for hallucinations, self-injury and suicidal ideas. The patient is nervous/anxious. The patient is not hyperactive.     There  were no vitals taken for this visit.There is no height or weight on file to calculate BMI.  General Appearance: Casual  Eye Contact:  Good  Speech:  Clear and Coherent and Normal Rate  Volume:  Normal  Mood:  Anxious and Depressed  Affect:  Congruent  Thought Process:  Coherent  Orientation:  Full (Time, Place, and Person)  Thought Content: WDL   Suicidal Thoughts:  No  Homicidal Thoughts:  No  Memory:  Immediate;   Good Recent;   Good Remote;   Fair  Judgement:  Good  Insight:  Good  Psychomotor Activity:  Normal  Concentration:  Concentration: Good and Attention Span: Good  Recall:  Good  Fund of Knowledge: Good  Language: Good  Akathisia:  No  Handed:  Right  AIMS (if indicated): not done  Assets:  Communication Skills Desire for Improvement Housing Transportation  ADL's:  Intact  Cognition: WNL  Sleep:  Fair   Screenings: GAD-7    Flowsheet Row Clinical Support from 06/24/2023 in [facility] Office Visit from 05/13/2023 in [facility] Counselor from 03/31/2023 in [facility]  Total GAD-7 Score 7 10 18       PHQ2-9    Flowsheet Row Clinical Support from 06/24/2023 in [facility] Office Visit from 06/08/2023 in [facility] Office Visit from 05/13/2023 in [facility] Office Visit from 04/23/2023 in [facility] Counselor from 03/31/2023 in  [facility]  PHQ-2 Total Score 6 6 6 6 6   PHQ-9 Total Score 22 16 21 20 20       Flowsheet Row Clinical Support from 06/24/2023 in [facility] Office Visit from 05/13/2023 in [facility] Counselor from 03/31/2023 in [facility]  C-SSRS RISK CATEGORY Moderate Risk No Risk No Risk        Assessment and Plan: ***    Collaboration of Care: Collaboration of Care: Medication Management AEB provider managing patient's psychiatric medications, Primary Care Provider AEB patient being seen by a family medicine provider, Psychiatrist AEB patient being followed by mental health provider at this facility, and Other provider involved in patient's care AEB patient being seen by oncology and pulmonology  Patient/Guardian was advised Release of Information must be obtained prior to any record release in order to collaborate their care with an outside provider. Patient/Guardian was advised if they have not already done so to contact the registration department to sign all necessary forms in order for [place] to release information regarding their care.   Consent: Patient/Guardian gives verbal consent for treatment and assignment of benefits for services provided during this visit. Patient/Guardian expressed understanding and agreed to proceed.   1. Severe episode of recurrent major depressive disorder, without psychotic features (HCC) Patient is currently taking bupropion (Wellbutrin XL) 150 mg 24-hour tablet daily for the management of his major depressive disorder  2. GAD (generalized anxiety disorder)  Patient to follow up in 4 weeks Provider spent a total of 26 minutes with the patient/reviewing patient's chart  Meta Hatchet, PA 06/25/2023, 9:49 AM

## 2023-06-25 ENCOUNTER — Encounter (HOSPITAL_COMMUNITY): Payer: Self-pay | Admitting: Physician Assistant

## 2023-06-25 ENCOUNTER — Other Ambulatory Visit: Payer: Self-pay

## 2023-06-25 ENCOUNTER — Telehealth: Payer: Self-pay | Admitting: Internal Medicine

## 2023-06-25 NOTE — Patient Instructions (Signed)
 Tailored Plan Medicaid On July 1, some people on [place] [employer] will move to a new kind of Medicaid health plan called a Tailored Plan. Tailored Plans cover your doctor visits, prescription drugs, and health care services.    If your [place] Medicaid will move to a Tailored Plan, you should have gotten a letter and welcome packet. If you're not sure, call your [place] Medicaid Enrollment Broker at [phone] and ask.  Check out these free materials, in [place] and [place], to learn more about your Tailored Plan: [email or web address]/Tailored-Plans/Toolkit  Tailored Care Management Services  TCM services are available to you now. If you are a Tailored Plan member or will be and want information about Tailored Care Management Services including rides to appointments and community and home services, call the Care Management provider for your county of residence:    [facility] ([place], [place])  Member Services: [phone] Behavioral Health Crisis Line: 9081880558, [place], [place], [place], [place])  Member Services: 6198549866 Behavioral Health Crisis Line: [phone]  [facility]) Member Services: [phone] Behavioral Health Crisis Line: [phone]

## 2023-06-25 NOTE — Patient Outreach (Signed)
Medicaid Managed Care Social Work Note  06/25/2023 Name:  [name] MRN:  [id number] DOB:  1965-11-19  [name] is an 57 y.o. year old male who is a primary patient of Jeffery Eland, MD.  The Medicaid Managed Care Coordination team was consulted for assistance with:   housing  Mr. [name] was given information about Medicaid Managed Care Coordination team services today. [name] Patient agreed to services and verbal consent obtained.  Engaged with patient  for by telephone forfollow up visit in response to referral for case management and/or care coordination services.   Assessments/Interventions:  Review of past medical history, allergies, medications, health status, including review of consultants reports, laboratory and other test data, was performed as part of comprehensive evaluation and provision of chronic care management services.  SDOH: (Social Determinant of Health) assessments and interventions performed: SDOH Interventions    Flowsheet Row Chronic Care Management from 02/13/2022 in [facility] Office Visit from 12/19/2021 in [facility] Chronic Care Management from 05/06/2021 in [facility] Office Visit from 09/14/2017 in [facility]  SDOH Interventions      Food Insecurity Interventions Other (Comment)  [pt already connected with FNS,  is not interested in other resources] -- -- --  Housing Interventions Patient Refused  [pt is not interested in a shelter] -- -- --  Transportation Interventions -- -- [employer] --  Depression Interventions/Treatment  -- Referral to Psychiatry -- Currently on Treatment  Financial Strain Interventions -- -- [id number] Referral --     BSW completed a telephone outreach with patient he states he is to graduate on 07/16/23 and will be leaving the facility on that date. Patient states the facility he is is does not assist with  locating housing. Patient does not have any income. BSW made another attempt to the [facility] and spoke with Mr [name], after checking patients file he stated that per their notes patient stated that he did not want the [facility] help and it would be up to the administrator [name] to open the case back up. BSW provided contact information for [name] to contact BSW back. Mr. [name] contacted BSW back and stated there was additional notes that he did not see and patient waited too long to do a reconsideration and a new referral needed to be done. BSW completed referral. BSW provided patient with the contact information for [name]  to get services started.  Advanced Directives Status:  Not addressed in this encounter.  Care Plan                 No Known Allergies  Medications Reviewed Today   Medications were not reviewed in this encounter     Patient Active Problem List   Diagnosis Date Noted   GAD (generalized anxiety disorder) 03/31/2023   Paresthesia 03/12/2023   Hyperlipidemia 02/17/2023   Ankle swelling 02/02/2023   Orbital swelling 02/02/2023   Social problem 12/18/2022   Elevated BP without diagnosis of hypertension 02/10/2022   Asthma 12/19/2021   Renal cyst 12/17/2021   Homelessness 03/14/2021   Morbid obesity (HCC) 03/14/2021   Amnesia 02/09/2020   Hypogonadism in male 09/14/2017   OSA (obstructive sleep apnea) 05/29/2014   Iron deficiency anemia 12/22/2013   Chronic fatigue 07/07/2013   Adult ADHD 07/07/2013   Depression 01/07/2012   GERD (gastroesophageal reflux disease) 01/07/2012    Conditions to be addressed/monitored per PCP order:  housing  There are no care plans that you recently modified to display for this patient.   Follow up:  Patient agrees to Care Plan and Follow-up.  Plan: The Managed Medicaid care management team will reach out to the patient again over the next 12 days.  Date/time of next scheduled Social Work care  management/care coordination outreach:  07/13/23 [name], [name], [facility]  Managed [facility] Social Worker [phone]

## 2023-06-25 NOTE — Telephone Encounter (Signed)
Patient is here in person. He had a sleep study scheduled but it was cancelled. Doctor Maple Hudson will use prior results to order him a new machine. Patient would like to know where the machine is coming from and where he will receive it.    Patient would also like Trelegy samples.

## 2023-06-26 ENCOUNTER — Other Ambulatory Visit: Payer: Self-pay

## 2023-06-26 ENCOUNTER — Encounter (HOSPITAL_COMMUNITY): Payer: Self-pay

## 2023-06-26 ENCOUNTER — Emergency Department (HOSPITAL_COMMUNITY)
Admission: EM | Admit: 2023-06-26 | Discharge: 2023-06-26 | Disposition: A | Discharge status: Home / Self Care | Payer: Medicaid Other | Attending: Emergency Medicine | Admitting: Emergency Medicine

## 2023-06-26 ENCOUNTER — Encounter: Payer: Self-pay | Admitting: Physician Assistant

## 2023-06-26 DIAGNOSIS — R5383 Other fatigue: Secondary | ICD-10-CM | POA: Diagnosis present

## 2023-06-26 LAB — CBC WITH DIFFERENTIAL/PLATELET
Abs Immature Granulocytes: 0.06 10*3/uL (ref 0.00–0.07)
Basophils Absolute: 0.1 10*3/uL (ref 0.0–0.1)
Basophils Relative: 1 %
Eosinophils Absolute: 0.4 10*3/uL (ref 0.0–0.5)
Eosinophils Relative: 5 %
HCT: 37.5 % — ABNORMAL LOW (ref 39.0–52.0)
Hemoglobin: 10.3 g/dL — ABNORMAL LOW (ref 13.0–17.0)
Immature Granulocytes: 1 %
Lymphocytes Relative: 10 %
Lymphs Abs: 0.7 10*3/uL (ref 0.7–4.0)
MCH: 23.7 pg — ABNORMAL LOW (ref 26.0–34.0)
MCHC: 27.5 g/dL — ABNORMAL LOW (ref 30.0–36.0)
MCV: 86.2 fL (ref 80.0–100.0)
Monocytes Absolute: 0.5 10*3/uL (ref 0.1–1.0)
Monocytes Relative: 7 %
Neutro Abs: 5.7 10*3/uL (ref 1.7–7.7)
Neutrophils Relative %: 76 %
Platelets: 290 10*3/uL (ref 150–400)
RBC: 4.35 MIL/uL (ref 4.22–5.81)
RDW: 17.5 % — ABNORMAL HIGH (ref 11.5–15.5)
WBC: 7.5 10*3/uL (ref 4.0–10.5)
nRBC: 0 % (ref 0.0–0.2)

## 2023-06-26 LAB — COMPREHENSIVE METABOLIC PANEL
ALT: 18 U/L (ref 0–44)
AST: 22 U/L (ref 15–41)
Albumin: 3.6 g/dL (ref 3.5–5.0)
Alkaline Phosphatase: 82 U/L (ref 38–126)
Anion gap: 9 (ref 5–15)
BUN: 26 mg/dL — ABNORMAL HIGH (ref 6–20)
CO2: 22 mmol/L (ref 22–32)
Calcium: 9 mg/dL (ref 8.9–10.3)
Chloride: 108 mmol/L (ref 98–111)
Creatinine, Ser: 1.07 mg/dL (ref 0.61–1.24)
GFR, Estimated: >60 mL/min (ref >60)
Glucose, Bld: 122 mg/dL — ABNORMAL HIGH (ref 70–99)
Potassium: 4.4 mmol/L (ref 3.5–5.1)
Sodium: 139 mmol/L (ref 135–145)
Total Bilirubin: 0.5 mg/dL (ref 0.3–1.2)
Total Protein: 6.7 g/dL (ref 6.5–8.1)

## 2023-06-26 LAB — URINALYSIS, W/ REFLEX TO CULTURE (INFECTION SUSPECTED)
Bilirubin Urine: NEGATIVE
Glucose, UA: NEGATIVE mg/dL
Hgb urine dipstick: NEGATIVE
Ketones, ur: NEGATIVE mg/dL
Leukocytes,Ua: NEGATIVE
Nitrite: NEGATIVE
Protein, ur: NEGATIVE mg/dL
Specific Gravity, Urine: 1.024 (ref 1.005–1.030)
pH: 5 (ref 5.0–8.0)

## 2023-06-26 LAB — CBG MONITORING, ED: Glucose-Capillary: 116 mg/dL — ABNORMAL HIGH (ref 70–99)

## 2023-06-26 NOTE — ED Notes (Signed)
X2 attempts for labs.

## 2023-06-26 NOTE — ED Provider Notes (Signed)
[place] EMERGENCY DEPARTMENT AT [facility] Provider Note   CSN: [id number] Arrival date & time: 06/26/23  8657     History  Chief Complaint  Patient presents with   Fatigue        Nausea   Dizziness    [name] is a 57 y.o. male.  57 year old male with prior medical history as detailed below presents for evaluation.  Patient complains of generalized weakness, intermittent lightheadedness, mild nausea.  Symptoms appear to been ongoing for several weeks.  Patient reports longstanding history of iron deficiency.  He reports receiving an iron transfusion earlier this week on Monday.  He reports that his symptoms have been persistent since this most recent iron transfusion.  He denies associated chest pain or shortness of breath.  He denies fever.  He denies bleeding.  He reports that occasionally will have tingling " prickles" over his arms and legs in his calves.  He denies current calf pain or lower extremity edema.  He reports that his current residence requires that he perform daily duties.  He reports that his fatigue is worse over the last several days and he is requesting a note for bed rest so that he can recover his energy.  The history is provided by the patient and medical records.       Home Medications Prior to Admission medications   Medication Sig Start Date End Date Taking? Authorizing Provider  albuterol (VENTOLIN HFA) 108 (90 Base) MCG/ACT inhaler Inhale 1-2 puffs into the lungs every 6 (six) hours as needed for wheezing or shortness of breath. Patient not taking: Reported on 06/08/2023 02/17/23   [name], MD  buPROPion (WELLBUTRIN XL) 150 MG 24 hr tablet Take 1 tablet (150 mg total) by mouth daily. 06/08/23   [name], MD  pantoprazole (PROTONIX) 40 MG tablet Take 1 tablet (40 mg total) by mouth daily. 01/12/23   [name], MD  umeclidinium-vilanterol (ANORO ELLIPTA) 62.5-25 MCG/ACT AEPB Inhale 1 puff into the lungs  daily. Patient not taking: Reported on 06/08/2023 04/07/23   Glenford Bayley, NP      Allergies    Patient has no known allergies.    Review of Systems   Review of Systems  All other systems reviewed and are negative.   Physical Exam Updated Vital Signs BP 134/82   Pulse 91   Temp 97.9 F (36.6 C) (Oral)   Resp 17   Ht 5\' 11"  (1.803 m)   Wt 134.3 kg   SpO2 96%   BMI 41.28 kg/m  Physical Exam Vitals and nursing note reviewed.  Constitutional:      General: He is not in acute distress.    Appearance: Normal appearance. He is well-developed.  HENT:     Head: Normocephalic and atraumatic.  Eyes:     Conjunctiva/sclera: Conjunctivae normal.     Pupils: Pupils are equal, round, and reactive to light.  Cardiovascular:     Rate and Rhythm: Normal rate and regular rhythm.     Heart sounds: Normal heart sounds.  Pulmonary:     Effort: Pulmonary effort is normal. No respiratory distress.     Breath sounds: Normal breath sounds.  Abdominal:     General: There is no distension.     Palpations: Abdomen is soft.     Tenderness: There is no abdominal tenderness.  Musculoskeletal:        General: No deformity. Normal range of motion.     Cervical  back: Normal range of motion and neck supple.  Skin:    General: Skin is warm and dry.  Neurological:     General: No focal deficit present.     Mental Status: He is alert and oriented to person, place, and time.     ED Results / Procedures / Treatments   Labs (all labs ordered are listed, but only abnormal results are displayed) Labs Reviewed  COMPREHENSIVE METABOLIC PANEL - Abnormal; Notable for the following components:      Result Value   Glucose, Bld 122 (*)    BUN 26 (*)    All other components within normal limits  CBC WITH DIFFERENTIAL/PLATELET - Abnormal; Notable for the following components:   Hemoglobin 10.3 (*)    HCT 37.5 (*)    MCH 23.7 (*)    MCHC 27.5 (*)    RDW 17.5 (*)    All other components within  normal limits  URINALYSIS, W/ REFLEX TO CULTURE (INFECTION SUSPECTED) - Abnormal; Notable for the following components:   Bacteria, UA RARE (*)    All other components within normal limits  CBG MONITORING, ED - Abnormal; Notable for the following components:   Glucose-Capillary 116 (*)    All other components within normal limits  TYPE AND SCREEN    EKG EKG Interpretation Date/Time:  Saturday June 26 2023 10:01:59 EDT Ventricular Rate:  95 PR Interval:  140 QRS Duration:  101 QT Interval:  343 QTC Calculation: 432 R Axis:   111  Text Interpretation: Sinus rhythm Left posterior fascicular block Inferior infarct, old Confirmed by Pricilla Loveless [phone]) on 06/26/2023 10:07:44 AM  Radiology No results found.  Procedures Procedures    Medications Ordered in ED Medications - No data to display  ED Course/ Medical Decision Making/ A&P                                 Medical Decision Making Amount and/or Complexity of Data Reviewed Labs: ordered.    Medical Screen Complete  This patient presented to the ED with complaint of fatigue.  This complaint involves an extensive number of treatment options. The initial differential diagnosis includes, but is not limited to, metabolic abnormality, anemia, infection, etc.  This presentation is: Chronic, Self-Limited, Previously Undiagnosed, Uncertain Prognosis, Complicated, Systemic Symptoms, and Threat to Life/Bodily Function  Patient with longstanding history of iron deficiency, anemia, depression, homelessness who presents with complaint of acute on chronic fatigue.  Patient's symptoms are nonspecific and appear to be mostly chronic in nature.  Screening labs obtained are on the whole without significant abnormality.  Importantly, patient's hemoglobin is 10.3 near baseline of 10-11.  Patient did just receive an iron transfusion less than a week ago.  Other screening labs are without significant abnormality.  Extensively  discussed the nature of the patient's symptoms with him.  Patient does have established outpatient follow-up with with PCP, GI, and the transfusion clinic.  Additionally patient is followed by psychiatry for treatment of his depression.  Patient is comfortable with plan for discharge home.  He does understand need for close outpatient follow-up.  Strict return precautions given and understood.   Additional history obtained: External records from outside sources obtained and reviewed including prior ED visits and prior Inpatient records.    Lab Tests:  I ordered and personally interpreted labs.  The pertinent results include: CBC, CMP, CBG, UA Problem List / ED Course:  Fatigue  Reevaluation:  After the interventions noted above, I reevaluated the patient and found that they have: stayed the same  Disposition:  After consideration of the diagnostic results and the patients response to treatment, I feel that the patent would benefit from close outpatient follow-up.          Final Clinical Impression(s) / ED Diagnoses Final diagnoses:  Other fatigue    Rx / DC Orders ED Discharge Orders     None         Wynetta Fines, MD 06/26/23 1826

## 2023-06-26 NOTE — Discharge Instructions (Addendum)
Return for any problem.  ?

## 2023-06-26 NOTE — ED Notes (Signed)
Unable to get Type and screen. Lt and Lav sent to lab

## 2023-06-26 NOTE — ED Triage Notes (Signed)
Pt complaining of generalized weakness, bilateral calf discomfort, light headed, and nausea. Pt received an iron infusion at the cancer center Monday, since then the nausea and lightlessness have continued to get worse, and the weakness has not improved.

## 2023-06-28 NOTE — Telephone Encounter (Signed)
Attempted to call patient back  Patient requesting another sample of Trelegy. Per chart he is on Anoro but was given a sample of Trelegy previously. Also patient was under the impression that he didn't need to proceed with sleep study so he cancelled it.He says you were going to use his old study. So he came back office to ask where new machine would be coming from/timeline of arrival.  Please advise Last ov 05/27/23  Instructions   Return in about 3 months (around 08/26/2023). Keep appointment for sleep studies next month   Reach out to your primary doctor to renew the manufacturer's assistance for your Anoro, before your asthma gets out of control this Fall and Winter.   Try going back to the [employer] office that gave you the SD card- if they can't read it for [place], maybe they can replace it with a different card that they can read.

## 2023-06-28 NOTE — Patient Outreach (Signed)
error 

## 2023-06-29 ENCOUNTER — Encounter: Payer: Self-pay | Admitting: Student

## 2023-06-29 ENCOUNTER — Encounter: Payer: Self-pay | Admitting: Physician Assistant

## 2023-06-29 ENCOUNTER — Ambulatory Visit (INDEPENDENT_AMBULATORY_CARE_PROVIDER_SITE_OTHER): Payer: Medicaid Other | Admitting: Student

## 2023-06-29 ENCOUNTER — Other Ambulatory Visit (HOSPITAL_COMMUNITY): Payer: Self-pay

## 2023-06-29 ENCOUNTER — Ambulatory Visit: Payer: Self-pay | Admitting: Family Medicine

## 2023-06-29 VITALS — BP 139/70 | HR 80 | Ht 71.0 in | Wt 300.6 lb

## 2023-06-29 DIAGNOSIS — R609 Edema, unspecified: Secondary | ICD-10-CM

## 2023-06-29 DIAGNOSIS — R5383 Other fatigue: Secondary | ICD-10-CM | POA: Diagnosis present

## 2023-06-29 DIAGNOSIS — R0609 Other forms of dyspnea: Secondary | ICD-10-CM

## 2023-06-29 LAB — POCT GLYCOSYLATED HEMOGLOBIN (HGB A1C): Hemoglobin A1C: 5.4 % (ref 4.0–5.6)

## 2023-06-29 MED ORDER — ALBUTEROL SULFATE HFA 108 (90 BASE) MCG/ACT IN AERS
1.0000 | INHALATION_SPRAY | Freq: Four times a day (QID) | RESPIRATORY_TRACT | 3 refills | Status: DC | PRN
Start: 2023-06-29 — End: 2024-02-03
  Filled 2023-06-29: qty 18, 25d supply, fill #0
  Filled 2023-09-20: qty 18, 25d supply, fill #1
  Filled 2023-11-29: qty 18, 25d supply, fill #2
  Filled 2024-01-03: qty 18, 25d supply, fill #3

## 2023-06-29 NOTE — Telephone Encounter (Signed)
We had asked [employer] to get [place] a download from his old CPAP machine and to set it at 15 cwp. We will reconsider sleep studies in the future, since he cancelled what Buelah Manis had ordered.

## 2023-06-29 NOTE — Progress Notes (Signed)
    SUBJECTIVE:   CHIEF COMPLAINT / HPI:   Chronic Fatigue Known chronic fatigue. Worsening, recently seen in ED. Anemic at baseline and receiving infusions. Poor living situation increasing stress. Started Wellbutrin ~2 weeks ago, with minimal improvement. Weight gain.  Exertional dyspnea Exertional dyspnea, not resolved with inhaler. Denies chest pain, dizziness. Does have increased HR and SOB after strenuous activity. Not symptomatic at presentation. Compliant with inhalers. Also having leg swelling. Recent ED visit with normal renal and liver function. Echo 1 year ago WNL. No prior stress testing.  PERTINENT  PMH / PSH: GERD, Asthma, OSA, morbid obesity  OBJECTIVE:   BP 139/70   Pulse 80   Ht 5\' 11"  (1.803 m)   Wt (!) 300 lb 9.6 oz (136.4 kg)   SpO2 98%   BMI 41.93 kg/m    General: NAD, pleasant, Cardio: RRR, no MRG. BL +1 pitting edema. Respiratory: CTAB, normal wob on RA GI: Abdomen is soft, not tender, not distended. BS present Skin: Warm and dry  ASSESSMENT/PLAN:   Assessment & Plan Other fatigue Chronic in nature. Likely multifactorial: Iron deficiency anemia, depression, poor social situation, obesity, OSA, OHS. A1C 5.4 today. Will assess thyroid today. -TSH w/ reflex -Wrote accommodations for his current group home situation to allow for more sleep Exertional dyspnea Asymptomatic in room. BL +1 pitting edema. Renal and Liver function normal. Differential includes: Stable angina, heart failure, asthma. -BNP -Consider repeat echo if BNP elevated -Ref cardiology for consideration of stress test Edema, unspecified type Lower extremity edema. Differential: venous stasis, CHF. Renal and liver function normal.  Follow-up recommendations Interested in weight loss counseling. May qualify for [facility] now that he has medicaid. Need to clarify CPAP usage  Tiffany Kocher, DO [facility]

## 2023-06-29 NOTE — Patient Instructions (Signed)
It was great to see you! Thank you for allowing me to participate in your care!   I recommend that you always bring your medications to each appointment as this makes it easy to ensure we are on the correct medications and helps [place] not miss when refills are needed.  Our plans for today:  - Please schedule follow-up in the next 2-4 weeks to discuss depression and weight loss - I have sent referral to cardiology - I will call you with lab results  We are checking some labs today, I will call you if they are abnormal will send you a MyChart message or a letter if they are normal.  If you do not hear about your labs in the next 2 weeks please let us know.  Take care and seek immediate care sooner if you develop any concerns. Please remember to show up 15 minutes before your scheduled appointment time!  Tiffany Kocher, DO [facility]

## 2023-06-30 LAB — BRAIN NATRIURETIC PEPTIDE: BNP: 28.7 pg/mL (ref 0.0–100.0)

## 2023-06-30 LAB — TSH RFX ON ABNORMAL TO FREE T4: TSH: 1.76 u[IU]/mL (ref 0.450–4.500)

## 2023-07-13 ENCOUNTER — Other Ambulatory Visit: Payer: Self-pay

## 2023-07-13 ENCOUNTER — Encounter: Payer: Self-pay | Admitting: Family Medicine

## 2023-07-13 ENCOUNTER — Encounter: Payer: Self-pay | Admitting: Physician Assistant

## 2023-07-13 ENCOUNTER — Other Ambulatory Visit (HOSPITAL_COMMUNITY): Payer: Self-pay

## 2023-07-13 ENCOUNTER — Ambulatory Visit (INDEPENDENT_AMBULATORY_CARE_PROVIDER_SITE_OTHER): Payer: Medicaid Other | Admitting: Family Medicine

## 2023-07-13 VITALS — BP 177/97 | HR 78 | Wt 298.0 lb

## 2023-07-13 DIAGNOSIS — R030 Elevated blood-pressure reading, without diagnosis of hypertension: Secondary | ICD-10-CM | POA: Diagnosis present

## 2023-07-13 DIAGNOSIS — G4733 Obstructive sleep apnea (adult) (pediatric): Secondary | ICD-10-CM

## 2023-07-13 DIAGNOSIS — Z6841 Body Mass Index (BMI) 40.0 and over, adult: Secondary | ICD-10-CM | POA: Diagnosis not present

## 2023-07-13 MED ORDER — SEMAGLUTIDE-WEIGHT MANAGEMENT 0.25 MG/0.5ML ~~LOC~~ SOAJ
0.2500 mg | SUBCUTANEOUS | 0 refills | Status: DC
Start: 2023-07-13 — End: 2023-08-03
  Filled 2023-07-13 – 2023-07-26 (×6): qty 2, 28d supply, fill #0

## 2023-07-13 NOTE — Assessment & Plan Note (Signed)
Attempted lifestyle modification including diet change and exercise with no improvement He does have other chronic problems that prevents him from getting adequate and regular exercise  He is interested in started a GLP1 receptor agonist to help with his weight loss journey He denies FHx of Medullary Thyroid Cancer He does not have a hx of cancer Counseling provided regarding Wegovy initiation F/U in 3-4 weeks for reassessment All questions were answered

## 2023-07-13 NOTE — Patient Instructions (Signed)
 Semaglutide Injection (Weight Management) What is this medication? SEMAGLUTIDE (SEM a GLOO tide) promotes weight loss. It may also be used to maintain weight loss.  It works by decreasing appetite. It can be used to lower the risk of heart attack and stroke in people affected by excess weight. Changes to diet and exercise are often combined with this medication. This medicine may be used for other purposes; ask your health care provider or pharmacist if you have questions. COMMON BRAND NAME(S): GNFAOZ What should I tell my care team before I take this medication? They need to know if you have any of these conditions: Diabetes Eye disease caused by diabetes History of depression or other mental health conditions Kidney disease Pancreatic disease Personal or family history of MEN 2, a condition that causes endocrine gland tumors Personal or family history of thyroid cancer Suicidal thoughts, plans, or attempt by you or a family member An unusual or allergic reaction to semaglutide, other medications, foods, dyes, or preservatives Pregnant or trying to get pregnant Breastfeeding How should I use this medication? This medication is injected under the skin. You will be taught how to prepare and give it. Take it as directed on the prescription label. It is given once every week (every 7 days). Keep taking it unless your care team tells you to stop. It is important that you put your used needles and pens in a special sharps container. Do not put them in a trash can. If you do not have a sharps container, call your pharmacist or care team to get one. A special MedGuide will be given to you by the pharmacist with each prescription and refill. Be sure to read this information carefully each time. This medication comes with INSTRUCTIONS FOR USE. Ask your pharmacist for directions on how to use this medication. Read the information carefully. Talk to your pharmacist or care team if you have  questions. Talk to your care team about the use of this medication in children. While it may be prescribed for children as young as 12 years for selected conditions, precautions do apply. Overdosage: If you think you have taken too much of this medicine contact a poison control center or emergency room at once. NOTE: This medicine is only for you. Do not share this medicine with others. What if I miss a dose? If you miss a dose and the next scheduled dose is more than 2 days away, take the missed dose as soon as possible. If you miss a dose and the next scheduled dose is less than 2 days away, do not take the missed dose. Take the next dose at your regular time. Do not take double or extra doses. If you miss your dose for 2 weeks or more, take the next dose at your regular time or call your care team to talk about how to restart this medication. What may interact with this medication? Insulin and other medications for diabetes This list may not describe all possible interactions. Give your health care provider a list of all the medicines, herbs, non-prescription drugs, or dietary supplements you use. Also tell them if you smoke, drink alcohol, or use illegal drugs. Some items may interact with your medicine. What should I watch for while using this medication? Visit your care team for regular checks on your progress. It may be some time before you see the benefit from this medication. Drink plenty of fluids while taking this medication. Check with your care team if you have severe  diarrhea, nausea, and vomiting, or if you sweat a lot. The loss of too much body fluid may make it dangerous for you to take this medication. This medication may affect blood sugar levels. Ask your care team if changes in diet or medications are needed if you have diabetes. Talk to your care team if you may be pregnant. Losing weight while pregnant is not advised and may cause harm to the fetus. Talk to your care team for more  information. What side effects may I notice from receiving this medication? Side effects that you should report to your care team as soon as possible: Allergic reactions--skin rash, itching, hives, swelling of the face, lips, tongue, or throat Change in vision Dehydration--increased thirst, dry mouth, feeling faint or lightheaded, headache, dark yellow or brown urine Gallbladder problems--severe stomach pain, nausea, vomiting, fever Heart palpitations--rapid, pounding, or irregular heartbeat Kidney injury--decrease in the amount of urine, swelling of the ankles, hands, or feet Pancreatitis--severe stomach pain that spreads to your back or gets worse after eating or when touched, fever, nausea, vomiting Thoughts of suicide or self-harm, worsening mood, feelings of depression Thyroid cancer--new mass or lump in the neck, pain or trouble swallowing, trouble breathing, hoarseness Side effects that usually do not require medical attention (report these to your care team if they continue or are bothersome): Diarrhea Loss of appetite Nausea Upset stomach This list may not describe all possible side effects. Call your doctor for medical advice about side effects. You may report side effects to FDA at 1-800-FDA-1088. Where should I keep my medication? Keep out of the reach of children and pets. Refrigeration (preferred): Store in the refrigerator. Do not freeze. Keep this medication in the original container until you are ready to take it. Get rid of any unused medication after the expiration date. Room temperature: If needed, prior to cap removal, the pen can be stored at room temperature for up to 28 days. Protect from light. If it is stored at room temperature, get rid of any unused medication after 28 days or after it expires, whichever is first. It is important to get rid of the medication as soon as you no longer need it or it is expired. You can do this in two ways: Take the medication to a  medication take-back program. Check with your pharmacy or law enforcement to find a location. If you cannot return the medication, follow the directions in the MedGuide. NOTE: This sheet is a summary. It may not cover all possible information. If you have questions about this medicine, talk to your doctor, pharmacist, or health care provider.  2024 Elsevier/Gold Standard (2022-11-25 00:00:00)

## 2023-07-13 NOTE — Progress Notes (Signed)
Order has been placed.

## 2023-07-13 NOTE — Assessment & Plan Note (Addendum)
BP remains elevated but improved after rechecking today He is adamant  this is due to stress and hope that his BP will improve once his stress level goes down Holding off on start antihypertensive agents for now F/U in 2-3 weeks for reassessment

## 2023-07-13 NOTE — Progress Notes (Addendum)
    SUBJECTIVE:   CHIEF COMPLAINT / HPI:   Elevated BP: Denies any new concerns. He felt his BP was high due to the stress he is currently undergoing. Otherwise, there are no cardiopulm or neurologic symptoms.  Weight management: He has been trying to get some exercise in during the day. Wants to discuss medication management.  Skin tags: C/O skin tags around his neck, which are becoming more obvious, catch his clothes. He'd love to get these tags out.   Social issues: He is excited that he'll soon be [facility] from [facility] at 1 pm this Friday. He requested that I be at his graduation from this program. They offered him a re-entry accommodation. However, he declined it. He has a friend who has offered to take him in this Friday. Overall, he is very happy today.    PERTINENT  PMH / PSH: PMHx reviewed  OBJECTIVE:   BP (!) 177/97   Pulse 78   Wt 298 lb (135.2 kg)   SpO2 98%   BMI 41.56 kg/m    Physical Exam Vitals and nursing note reviewed.  Constitutional:      Appearance: He is obese.  Cardiovascular:     Rate and Rhythm: Normal rate and regular rhythm.     Heart sounds: Normal heart sounds. No murmur heard. Pulmonary:     Effort: Pulmonary effort is normal. No respiratory distress.     Breath sounds: Normal breath sounds. No wheezing.  Neurological:     Mental Status: He is alert.      ASSESSMENT/PLAN:   Elevated BP without diagnosis of hypertension BP remains elevated but improved after rechecking today He is adamant  this is due to stress and hope that his BP will improve once his stress level goes down Holding off on start antihypertensive agents for now F/U in 2-3 weeks for reassessment  BMI 40.0-44.9, adult (HCC) Attempted lifestyle modification including diet change and exercise with no improvement He does have other chronic problems that prevents him from getting adequate and regular exercise  He is interested in started a  GLP1 receptor agonist to help with his weight loss journey He denies FHx of Medullary Thyroid Cancer He does not have a hx of cancer Counseling provided regarding Wegovy initiation F/U in 3-4 weeks for reassessment All questions were answered     Janit Pagan, MD [facility]

## 2023-07-19 ENCOUNTER — Other Ambulatory Visit (HOSPITAL_COMMUNITY): Payer: Self-pay

## 2023-07-19 ENCOUNTER — Encounter: Payer: Self-pay | Admitting: Family Medicine

## 2023-07-19 ENCOUNTER — Telehealth: Payer: Self-pay

## 2023-07-19 NOTE — Telephone Encounter (Signed)
Pharmacy Patient Advocate Encounter   Received notification from CoverMyMeds that prior authorization for WEGOVY (0.25MG ) is required/requested.   Insurance verification completed.   The patient is insured through [employer] .   Per test claim: PA required; PA started via CoverMyMeds. KEY [id number]. . Waiting for clinical questions to populate.

## 2023-07-20 ENCOUNTER — Encounter: Payer: Self-pay | Admitting: Physician Assistant

## 2023-07-22 ENCOUNTER — Encounter (HOSPITAL_COMMUNITY): Payer: Self-pay | Admitting: Physician Assistant

## 2023-07-23 ENCOUNTER — Encounter (HOSPITAL_COMMUNITY): Payer: Self-pay | Admitting: Pharmacist

## 2023-07-23 ENCOUNTER — Other Ambulatory Visit: Payer: Self-pay

## 2023-07-23 ENCOUNTER — Other Ambulatory Visit (HOSPITAL_COMMUNITY): Payer: Self-pay

## 2023-07-23 NOTE — Telephone Encounter (Signed)
Pharmacy Patient Advocate Encounter   PA required; PA submitted to above mentioned insurance via CoverMyMeds Key/confirmation #/EOC [id number]. Status is pending

## 2023-07-26 ENCOUNTER — Other Ambulatory Visit (HOSPITAL_COMMUNITY): Payer: Self-pay

## 2023-07-27 ENCOUNTER — Other Ambulatory Visit (HOSPITAL_COMMUNITY): Payer: Self-pay

## 2023-07-29 ENCOUNTER — Ambulatory Visit (HOSPITAL_BASED_OUTPATIENT_CLINIC_OR_DEPARTMENT_OTHER): Payer: Medicaid Other | Attending: Internal Medicine | Admitting: Internal Medicine

## 2023-07-29 DIAGNOSIS — G4733 Obstructive sleep apnea (adult) (pediatric): Secondary | ICD-10-CM | POA: Insufficient documentation

## 2023-07-29 DIAGNOSIS — E669 Obesity, unspecified: Secondary | ICD-10-CM | POA: Diagnosis not present

## 2023-07-29 DIAGNOSIS — R5383 Other fatigue: Secondary | ICD-10-CM | POA: Diagnosis not present

## 2023-07-29 DIAGNOSIS — Z6841 Body Mass Index (BMI) 40.0 and over, adult: Secondary | ICD-10-CM | POA: Insufficient documentation

## 2023-08-01 DIAGNOSIS — G4733 Obstructive sleep apnea (adult) (pediatric): Secondary | ICD-10-CM

## 2023-08-01 NOTE — Procedures (Signed)
Patient Name: [name], Perro Date: 07/29/2023 Gender: Male D.O.B: Aug 30, 1966 Age (years): 29 Referring Provider: Jetty Duhamel MD, ABSM Height (inches): 71 Interpreting Physician: Jetty Duhamel MD, ABSM Weight (lbs): 298 RPSGT: Shelah Lewandowsky BMI: 42 MRN: [id number] Neck Size: 19.50  CLINICAL INFORMATION Sleep Study Type: Split Night CPAP Indication for sleep study: Excessive Daytime Sleepiness, Fatigue, Obesity, OSA Epworth Sleepiness Score: 12  SLEEP STUDY TECHNIQUE As per the AASM Manual for the Scoring of Sleep and Associated Events v2.3 (April 2016) with a hypopnea requiring 4% desaturations.  The channels recorded and monitored were frontal, central and occipital EEG, electrooculogram (EOG), submentalis EMG (chin), nasal and oral airflow, thoracic and abdominal wall motion, anterior tibialis EMG, snore microphone, electrocardiogram, and pulse oximetry. Continuous positive airway pressure (CPAP) was initiated when the patient met split night criteria and was titrated according to treat sleep-disordered breathing.  MEDICATIONS Medications self-administered by patient taken the night of the study : WELBUTERIN, ZOLOFT  RESPIRATORY PARAMETERS Diagnostic  Total AHI (/hr): 104.8 RDI (/hr): 112.7 OA Index (/hr): 6.9 CA Index (/hr): 0.0 REM AHI (/hr): N/A NREM AHI (/hr): 104.8 Supine AHI (/hr): 113.6 Non-supine AHI (/hr): 100.3 Min O2 Sat (%): 83.0 Mean O2 (%): 91.6 Time below 88% (min): 13.2   Titration  Optimal Pressure (cm): 12 AHI at Optimal Pressure (/hr): 0.5 Min O2 at Optimal Pressure (%): 87.0 Supine % at Optimal (%): 26 Sleep % at Optimal (%): 98   SLEEP ARCHITECTURE The recording time for the entire night was 426.2 minutes.  During a baseline period of 214.7 minutes, the patient slept for 122.5 minutes in REM and nonREM, yielding a sleep efficiency of 57.1%. Sleep onset after lights out was 8.9 minutes with a REM latency of N/A minutes. The patient spent  36.7% of the night in stage N1 sleep, 63.3% in stage N2 sleep, 0.0% in stage N3 and 0% in REM.  During the titration period of 208.6 minutes, the patient slept for 192.8 minutes in REM and nonREM, yielding a sleep efficiency of 92.4%. Sleep onset after CPAP initiation was 6.3 minutes with a REM latency of 43.5 minutes. The patient spent 6.4% of the night in stage N1 sleep, 64.6% in stage N2 sleep, 0.0% in stage N3 and 29% in REM.  CARDIAC DATA The 2 lead EKG demonstrated sinus rhythm. The mean heart rate was 100.0 beats per minute. Other EKG findings include: None.  LEG MOVEMENT DATA The total Periodic Limb Movements of Sleep (PLMS) were 0. The PLMS index was 0.0 .  IMPRESSIONS - Severe obstructive sleep apnea occurred during the diagnostic portion of the study (AHI = 104.8/hour). An optimal PAP pressure was selected for this patient ( 12 cm of water) - Moderate oxygen desaturation was noted during the diagnostic portion of the study (Min O2 =83.0%). On CPAP 12, minimum O2 sat 87%, Mean 93%. - The patient snored with moderate snoring volume during the diagnostic portion of the study. - No cardiac abnormalities were noted during this study. - Clinically significant periodic limb movements did not occur during sleep.  DIAGNOSIS - Obstructive Sleep Apnea (G47.33)  RECOMMENDATIONS - Trial of CPAP therapy on 12 cm H2O or autopap 5-15. - Patient wore a Large size Fisher[employer]Paykel Full Face Simplus mask and heated humidification. - Be careful with alcohol, sedatives and other CNS depressants that may worsen sleep apnea and disrupt normal sleep architecture. - Sleep hygiene should be reviewed to assess factors that may improve sleep quality. - Weight management and regular exercise should  be initiated or continued.  [Electronically signed] 08/01/2023 03:01 PM  Jetty Duhamel MD, ABSM Diplomate, American Board of Sleep Medicine NPI: [id number]                           Jetty Duhamel Diplomate, American Board of Sleep Medicine  ELECTRONICALLY SIGNED ON:  08/01/2023, 2:59 PM [place] PH: [phone]   FX: [phone] ACCREDITED BY THE AMERICAN ACADEMY OF SLEEP MEDICINE

## 2023-08-03 ENCOUNTER — Encounter: Payer: Self-pay | Admitting: Family Medicine

## 2023-08-03 ENCOUNTER — Ambulatory Visit (INDEPENDENT_AMBULATORY_CARE_PROVIDER_SITE_OTHER): Payer: Medicaid Other | Admitting: Physician Assistant

## 2023-08-03 ENCOUNTER — Ambulatory Visit: Payer: Medicaid Other | Admitting: Family Medicine

## 2023-08-03 ENCOUNTER — Encounter (HOSPITAL_COMMUNITY): Payer: Self-pay | Admitting: Physician Assistant

## 2023-08-03 VITALS — BP 130/92 | HR 105 | Ht 71.0 in | Wt 306.5 lb

## 2023-08-03 VITALS — BP 143/95 | Temp 98.6°F | Ht 71.0 in | Wt 306.4 lb

## 2023-08-03 DIAGNOSIS — Z8659 Personal history of other mental and behavioral disorders: Secondary | ICD-10-CM | POA: Diagnosis not present

## 2023-08-03 DIAGNOSIS — F332 Major depressive disorder, recurrent severe without psychotic features: Secondary | ICD-10-CM | POA: Diagnosis not present

## 2023-08-03 DIAGNOSIS — Z6841 Body Mass Index (BMI) 40.0 and over, adult: Secondary | ICD-10-CM

## 2023-08-03 DIAGNOSIS — F411 Generalized anxiety disorder: Secondary | ICD-10-CM

## 2023-08-03 DIAGNOSIS — R030 Elevated blood-pressure reading, without diagnosis of hypertension: Secondary | ICD-10-CM | POA: Diagnosis not present

## 2023-08-03 DIAGNOSIS — L918 Other hypertrophic disorders of the skin: Secondary | ICD-10-CM

## 2023-08-03 MED ORDER — SEMAGLUTIDE-WEIGHT MANAGEMENT 0.25 MG/0.5ML ~~LOC~~ SOAJ: 0.2500 mg | SUBCUTANEOUS | 0 refills | Status: DC

## 2023-08-03 NOTE — Patient Instructions (Addendum)
Skin Tag Removal Wound Care instructions  Sometimes bandages are not necessary.  If a bandage is used, leave the original bandage on for 24 hours if possible.  If the bandage becomes soaked or soiled before that time, it is OK to remove it and examine the wound.  A small amount of post-operative bleeding is normal.  If excessive bleeding occurs, remove the bandage, place gauze over the site and apply continuous pressure (no peeking) over the area for 20-30 minutes.  If this does not stop the bleeding, try again for 40 minutes.  If this does not work, please call our clinic as soon as possible (even if after hours).    Once a day, cleanse the wound with soap and water.  If a thick crust develops you may use a Q-tip dipped into dilute hydrogen peroxide (mix 1:1 with water) to dissolve it.  Hydrogen peroxide can slow the healing process, so use it only as needed.  After washing, apply Vaseline jelly or Polysporin ointment.  For best healing, the wound should be covered with a layer of ointment at all times.  This may mean re-applying the ointment several times a day.  For open wounds, continue until it has healed.    If you have any swelling, keep the area elevated.  Some redness, tenderness and white or yellow material in the wound is normal healing.  If the area becomes very sore and red, or develops a thick yellow-green material (pus), it may be infected; please notify us.    Wound healing continues for up to one year following surgery.  It is not unusual to experience pain in the scar from time to time during the interval.  If the pain becomes severe or the scar thickens, you should notify the office.  A slight amount of redness in a scar is expected for the first six months.  After six months, the redness subsides and the scar will soften and fade.  The color difference becomes less noticeable with time.  If there are any problems, return for a post-op surgery check at your earliest convenience.  It is  important to wear sunscreen daily with an SPF of 30 or higher over the treated sites and to wear sun-protective clothing.  Please call our office for any questions or concerns.   Skin Tag, Adult A skin tag (acrochordon) is a soft, extra growth of skin. Most skin tags are skin-colored and rarely bigger than a pencil eraser. They often form in areas where there is frequent rubbing, or friction, on the skin. This may be where there are folds in the skin, such as: The eyelids. The neck. The armpits. The groin. Skin tags are not dangerous, and they do not spread from person to person (are not contagious). You may have one skin tag or many. Skin tags do not need treatment. However, your health care provider may recommend removing a skin tag if it: Gets irritated from clothing or jewelry. Bleeds. Is visible and unsightly. What are the causes? This condition is linked to: Increasing age. Pregnancy. Diabetes. Obesity. What are the signs or symptoms? Skin tags usually do not cause symptoms unless they get irritated by items touching your skin, such as clothing or jewelry. When this happens, you may have pain, itching, or bleeding. How is this diagnosed? This condition is diagnosed with an evaluation from your health care provider. No testing is needed for diagnosis. How is this treated? Treatment for this condition depends on whether you have  symptoms. Your health care provider may also remove your skin tag if it is visible or if you do not like the way it looks. A skin tag can be removed by a health care provider with: A simple surgical procedure using scissors. A procedure that involves freezing your skin tag with a gas in liquid form (liquid nitrogen). A procedure that uses heat to destroy your skin tag (electrodessication). Follow these instructions at home: Watch for any changes in your skin tag. A normal skin tag does not require any other special care at home. Take over-the-counter and  prescription medicines only as told by your health care provider. Keep all follow-up visits. Contact a health care provider if: You have a skin tag that: Becomes painful. Changes color. Bleeds. Swells. Summary Skin tags are soft, extra growths of skin found in areas of frequent rubbing or friction. Skin tags usually do not cause symptoms. If symptoms occur, you may have pain, itching, or bleeding. Your health care provider may remove your skin tag if it causes symptoms or if you do not like the way it looks. This information is not intended to replace advice given to you by your health care provider. Make sure you discuss any questions you have with your health care provider. Document Revised: 10/08/2021 Document Reviewed: 10/08/2021 Elsevier Patient Education  2024 [employer].

## 2023-08-03 NOTE — Assessment & Plan Note (Signed)
BP improved Still diastolic HTN Continue lifestyle modification F/U in 4 weeks for reassessment

## 2023-08-03 NOTE — Progress Notes (Unsigned)
BH MD/PA/NP OP Progress Note  08/03/2023 6:07 PM [name]  MRN:  [id number]  Chief Complaint:  Chief Complaint  Patient presents with   Follow-up   HPI:   [name] is a 57 year old male with a past psychiatric history significant for major depressive disorder (recurrent episode, severe, without psychotic symptoms) and generalized anxiety disorder who presents to [facility] for follow up. During the last encounter, patient was being managed on the following psychiatric medication: Venlafaxine XR (Effexor XR) 37.5 mg 24-hour capsule daily.  ***  The patient is alert and oriented x 4, calm, cooperative, and fully engaged in conversation during the encounter. ***  Visit Diagnosis:    ICD-10-CM   1. Severe episode of recurrent major depressive disorder, without psychotic features (HCC)  F33.2     2. GAD (generalized anxiety disorder)  F41.1     3. History of ADHD  Z86.59       Past Psychiatric History:  Patient endorses the following psychiatric illnesses: ADHD, major depressive disorder, and generalized depression   Patient states that his last hospitalization occurred several years ago   Patient denies a past history of suicide attempts   Patient denies a past history of homicide attempts  Past Medical History:  Past Medical History:  Diagnosis Date   ADD (attention deficit disorder)    ADD (attention deficit disorder)    Allergy    Anemia, iron deficiency    Anxiety    Asthma    Blood transfusion    Constipation    Depression    Depression    Exertional dyspnea    secondary to anemia   GERD (gastroesophageal reflux disease)    Hiatal hernia    Narcolepsy    pt also reports episodes of passing out that are unrelated to narcolepsy about a year ago   Sleep apnea 01/2018   has a CPAP, "unable to use it"    Past Surgical History:  Procedure Laterality Date   COLONOSCOPY     UPPER GASTROINTESTINAL ENDOSCOPY       Family Psychiatric History:  Patient denies a family history of psychiatric illness   Family history of suicide attempt: Patient is not aware Family history of homicide attempts: Patient is not aware Family history of substance abuse: Patient is unaware  Family History:  Family History  Problem Relation Age of Onset   Diabetes Paternal Grandfather    Cancer Neg Hx    Stroke Neg Hx    Breast cancer Neg Hx    Celiac disease Neg Hx    Cirrhosis Neg Hx    Clotting disorder Neg Hx    Colitis Neg Hx    Colon cancer Neg Hx    Colon polyps Neg Hx    Crohn's disease Neg Hx    Cystic fibrosis Neg Hx    Esophageal cancer Neg Hx    Heart disease Neg Hx    Hemochromatosis Neg Hx    Inflammatory bowel disease Neg Hx    Irritable bowel syndrome Neg Hx    Kidney disease Neg Hx    Liver cancer Neg Hx    Liver disease Neg Hx    Ovarian cancer Neg Hx    Pancreatic cancer Neg Hx    Prostate cancer Neg Hx    Rectal cancer Neg Hx    Stomach cancer Neg Hx    Ulcerative colitis Neg Hx    Uterine cancer Neg Hx    Wilson's  disease Neg Hx     Social History:  Social History   Socioeconomic History   Marital status: Single    Spouse name: Not on file   Number of children: 0   Years of education: Not on file   Highest education level: Associate degree: academic program  Occupational History   Occupation: student-Business  Tobacco Use   Smoking status: Never   Smokeless tobacco: Never  Vaping Use   Vaping status: Never Used  Substance and Sexual Activity   Alcohol use: No   Drug use: No   Sexual activity: Not Currently  Other Topics Concern   Not on file  Social History Narrative   Lives alone. Never married, no children.  Has two sister who live in [place].  Student at work study.  [employer].  Working toward Chief Technology Officer.  Previously worked in Geophysicist/field seismologist.  Was also previously homeless due to job loss and psychiatric exacerbation.   Social  Determinants of Health   Financial Resource Strain: High Risk (07/06/2023)   Overall Financial Resource Strain (CARDIA)    Difficulty of Paying Living Expenses: Very hard  Food Insecurity: Patient Declined (07/06/2023)   Hunger Vital Sign    Worried About Running Out of Food in the Last Year: Patient declined    Ran Out of Food in the Last Year: Patient declined  Transportation Needs: Unmet Transportation Needs (07/06/2023)   PRAPARE - Administrator, Civil Service (Medical): No    Lack of Transportation (Non-Medical): Yes  Physical Activity: Insufficiently Active (07/06/2023)   Exercise Vital Sign    Days of Exercise per Week: 1 day    Minutes of Exercise per Session: 10 min  Stress: Stress Concern Present (07/06/2023)   [employer] of Occupational Health - Occupational Stress Questionnaire    Feeling of Stress : Rather much  Social Connections: Unknown (07/06/2023)   Social Connection and Isolation Panel [NHANES]    Frequency of Communication with Friends and Family: Never    Frequency of Social Gatherings with Friends and Family: Patient declined    Attends Religious Services: 1 to 4 times per year    Active Member of Clubs or Organizations: No    Attends Engineer, structural: Never    Marital Status: Never married    Allergies: No Known Allergies  Metabolic Disorder Labs: Lab Results  Component Value Date   HGBA1C 5.4 06/29/2023   No results found for: "PROLACTIN" Lab Results  Component Value Date   CHOL 181 02/02/2023   TRIG 86 02/02/2023   HDL 51 02/02/2023   CHOLHDL 3.5 02/02/2023   VLDL 26 05/29/2014   LDLCALC 114 (H) 02/02/2023   LDLCALC 116 (H) 03/14/2021   Lab Results  Component Value Date   TSH 1.760 06/29/2023   TSH 1.610 12/19/2021    Therapeutic Level Labs: No results found for: "LITHIUM" No results found for: "VALPROATE" No results found for: "CBMZ"  Current Medications: Current Outpatient Medications  Medication  Sig Dispense Refill   albuterol (VENTOLIN HFA) 108 (90 Base) MCG/ACT inhaler Inhale 1-2 puffs into the lungs every 6 (six) hours as needed for wheezing or shortness of breath. (Patient not taking: Reported on 07/13/2023) 18 g 3   buPROPion (WELLBUTRIN XL) 150 MG 24 hr tablet Take 1 tablet (150 mg total) by mouth daily. 90 tablet 1   pantoprazole (PROTONIX) 40 MG tablet Take 1 tablet (40 mg total) by mouth daily. 90 tablet 1   Semaglutide-Weight Management 0.25  MG/0.5ML SOAJ Inject 0.25 mg into the skin every 7 (seven) days. 2 mL 0   umeclidinium-vilanterol (ANORO ELLIPTA) 62.5-25 MCG/ACT AEPB Inhale 1 puff into the lungs daily. 90 each 4   No current facility-administered medications for this visit.     Musculoskeletal: Strength & Muscle Tone: within normal limits Gait & Station: normal Patient leans: N/A  Psychiatric Specialty Exam: Review of Systems  Psychiatric/Behavioral:  Positive for decreased concentration, dysphoric mood and sleep disturbance. Negative for hallucinations, self-injury and suicidal ideas. The patient is not nervous/anxious and is not hyperactive.     Blood pressure (!) 143/95, temperature 98.6 F (37 C), temperature source Oral, height 5\' 11"  (1.803 m), weight (!) 306 lb 6.4 oz (139 kg), SpO2 95%.Body mass index is 42.73 kg/m.  General Appearance: Casual  Eye Contact:  Good  Speech:  Clear and Coherent and Normal Rate  Volume:  Normal  Mood:  Depressed  Affect:  Congruent  Thought Process:  Coherent  Orientation:  Full (Time, Place, and Person)  Thought Content: WDL   Suicidal Thoughts:  No  Homicidal Thoughts:  No  Memory:  Immediate;   Good Recent;   Good Remote;   Fair  Judgement:  Good  Insight:  Good  Psychomotor Activity:  Normal  Concentration:  Concentration: Good and Attention Span: Good  Recall:  Good  Fund of Knowledge: Good  Language: Good  Akathisia:  No  Handed:  Right  AIMS (if indicated): not done  Assets:  Communication  Skills Desire for Improvement Housing Transportation  ADL's:  Intact  Cognition: WNL  Sleep:  Fair   Screenings: GAD-7    Flowsheet Row Clinical Support from 08/03/2023 in [facility] Clinical Support from 06/24/2023 in [facility] Office Visit from 05/13/2023 in [facility] Counselor from 03/31/2023 in [facility]  Total GAD-7 Score 9 7 10 18       PHQ2-9    Flowsheet Row Clinical Support from 08/03/2023 in [facility] Most recent reading at 08/03/2023  2:11 PM Office Visit from 08/03/2023 in [facility] Dept Of [place]. [facility] Most recent reading at 08/03/2023  9:49 AM Office Visit from 07/13/2023 in [facility] - [facility] Dept Of [place]. [facility] Most recent reading at 07/13/2023 11:07 AM Office Visit from 06/29/2023 in [facility] - A Dept Of [place]. [facility] Most recent reading at 06/29/2023  3:47 PM Clinical Support from 06/24/2023 in [facility] Most recent reading at 06/24/2023 11:18 AM  PHQ-2 Total Score 6 4 4 6 6   PHQ-9 Total Score 20 19 18 22 22       Flowsheet Row Clinical Support from 08/03/2023 in [facility] ED from 06/26/2023 in [facility] at [facility] Clinical Support from 06/24/2023 in [facility]  C-SSRS RISK CATEGORY Moderate Risk Moderate Risk Moderate Risk        Assessment and Plan:   Basel Rehkop is a 57 year old male with a past psychiatric history significant for major depressive disorder (recurrent episode, severe, without psychotic symptoms) and generalized anxiety disorder who presents to [facility] for follow up. ***  Collaboration of Care: Collaboration of Care:  Medication Management AEB provider managing patient's psychiatric medications, Primary Care Provider AEB patient being seen by  a family medicine provider, Psychiatrist AEB patient being followed by mental health provider at this facility, and Other provider involved in patient's care AEB patient being seen by oncology and pulmonology  Patient/Guardian was advised Release of Information must be obtained prior to any record release in order to collaborate their care with an outside provider. Patient/Guardian was advised if they have not already done so to contact the registration department to sign all necessary forms in order for [place] to release information regarding their care.   Consent: Patient/Guardian gives verbal consent for treatment and assignment of benefits for services provided during this visit. Patient/Guardian expressed understanding and agreed to proceed.   1. Severe episode of recurrent major depressive disorder, without psychotic features (HCC) Patient is currently taking bupropion (Wellbutrin XL) 150 mg 24-hour tablet daily for the management of his major depressive disorder   2. GAD (generalized anxiety disorder)  3. History of ADHD [place] Attention Specialist was contacted to place referral for patient for ADHD screening  Patient to follow up in 4 weeks Provider spent a total of 18 minutes with the patient/reviewing patient's chart  Meta Hatchet, PA 08/03/2023, 6:07 PM

## 2023-08-03 NOTE — Progress Notes (Signed)
    SUBJECTIVE:   CHIEF COMPLAINT / HPI:   Skin tags removal: Skin tags around his neck for many months. Increasing in size and number. He would like to get them removed.  Weight management: He is now on Wegovy 0.25 mg weekly. Tolerating them well. However, he wasted his first dose due to injection process mismanagement. He is here for injection training.  Elevated BP: He is now out of the Teen challenge program and feels that he is less stressed and BP is better.  PERTINENT  PMH / PSH: PMhx reviewed  OBJECTIVE:   BP (!) 130/92   Pulse (!) 105   Ht 5\' 11"  (1.803 m)   Wt (!) 306 lb 8 oz (139 kg)   SpO2 96%   BMI 42.75 kg/m   Physical Exam Vitals and nursing note reviewed.  Cardiovascular:     Rate and Rhythm: Normal rate and regular rhythm.     Heart sounds: Normal heart sounds. No murmur heard. Pulmonary:     Effort: Pulmonary effort is normal. No respiratory distress.     Breath sounds: Normal breath sounds. No wheezing.  Skin:    Comments: Multiple skin tags around his neck      ASSESSMENT/PLAN:   Skin tag Multiple tags around his neck Plan to remove 9 big ones Both verbal and written consent obtained for the procedure  Excision of Benign Skin Lesion Procedure Note PRE-OP DIAGNOSIS: Multiple neck skin tags  POST-OP DIAGNOSIS: Same   PROCEDURE: skin tags excision  Performing Physician: Janit Pagan  PROCEDURE:  Scissors  excision  The area surrounding the skin lesion was prepared and draped in the  usual sterile manner. The lesion was removed in the usual manner by the  biopsy method noted above. Hemostasis was assured.  Closure:   Rund brown bandage applied  Followup: The patient tolerated the procedure well without  complications.  Standard post-procedure care is explained and return  precautions are given.  BMI 40.0-44.9, adult [facility]) Now on Wegovy 0.25 mg weekly The pharmacy student came in a thought him self injection for about 5 minutes We  gave him 4 weeks samples of Wegovy 0.25 mg F/U in 4 weeks and plan to increase dose to 0.5 mg weekly He agreed with the plan  Elevated BP without diagnosis of hypertension BP improved Still diastolic HTN Continue lifestyle modification F/U in 4 weeks for reassessment     Janit Pagan, MD [facility]

## 2023-08-03 NOTE — Assessment & Plan Note (Signed)
Now on Wegovy 0.25 mg weekly The pharmacy student came in a thought him self injection for about 5 minutes We gave him 4 weeks samples of Wegovy 0.25 mg F/U in 4 weeks and plan to increase dose to 0.5 mg weekly He agreed with the plan

## 2023-08-03 NOTE — Assessment & Plan Note (Signed)
Multiple tags around his neck Plan to remove 9 big ones Both verbal and written consent obtained for the procedure  Excision of Benign Skin Lesion Procedure Note PRE-OP DIAGNOSIS: Multiple neck skin tags  POST-OP DIAGNOSIS: Same   PROCEDURE: skin tags excision  Performing Physician: Janit Pagan  PROCEDURE:  Scissors  excision  The area surrounding the skin lesion was prepared and draped in the  usual sterile manner. The lesion was removed in the usual manner by the  biopsy method noted above. Hemostasis was assured.  Closure:   Rund brown bandage applied  Followup: The patient tolerated the procedure well without  complications.  Standard post-procedure care is explained and return  precautions are given.

## 2023-08-10 ENCOUNTER — Encounter: Payer: Self-pay | Admitting: Family Medicine

## 2023-08-20 ENCOUNTER — Encounter: Payer: Self-pay | Admitting: Family Medicine

## 2023-08-21 ENCOUNTER — Other Ambulatory Visit: Payer: Self-pay | Admitting: Family Medicine

## 2023-08-21 DIAGNOSIS — K259 Gastric ulcer, unspecified as acute or chronic, without hemorrhage or perforation: Secondary | ICD-10-CM

## 2023-08-23 ENCOUNTER — Encounter: Payer: Self-pay | Admitting: Physician Assistant

## 2023-08-23 ENCOUNTER — Other Ambulatory Visit: Payer: Self-pay

## 2023-08-23 ENCOUNTER — Other Ambulatory Visit (HOSPITAL_COMMUNITY): Payer: Self-pay

## 2023-08-23 MED ORDER — SEMAGLUTIDE-WEIGHT MANAGEMENT 0.5 MG/0.5ML ~~LOC~~ SOAJ
0.5000 mg | SUBCUTANEOUS | 0 refills | Status: DC
Start: 2023-08-23 — End: 2023-09-14
  Filled 2023-08-23: qty 2, 28d supply, fill #0

## 2023-08-23 MED ORDER — PANTOPRAZOLE SODIUM 40 MG PO TBEC
40.0000 mg | DELAYED_RELEASE_TABLET | Freq: Every day | ORAL | 1 refills | Status: DC
  Filled 2023-08-23: qty 90, 90d supply, fill #0
  Filled 2023-11-01: qty 90, 90d supply, fill #1

## 2023-08-24 ENCOUNTER — Encounter: Payer: Self-pay | Admitting: Family Medicine

## 2023-08-25 ENCOUNTER — Other Ambulatory Visit (HOSPITAL_COMMUNITY): Payer: Self-pay

## 2023-08-27 ENCOUNTER — Encounter: Payer: Self-pay | Admitting: Physician Assistant

## 2023-08-27 ENCOUNTER — Other Ambulatory Visit (HOSPITAL_COMMUNITY): Payer: Self-pay

## 2023-08-27 ENCOUNTER — Encounter: Payer: Self-pay | Admitting: Family Medicine

## 2023-08-27 ENCOUNTER — Ambulatory Visit (INDEPENDENT_AMBULATORY_CARE_PROVIDER_SITE_OTHER): Payer: Medicaid Other | Admitting: Family Medicine

## 2023-08-27 VITALS — BP 139/84 | HR 85 | Wt 309.4 lb

## 2023-08-27 DIAGNOSIS — F909 Attention-deficit hyperactivity disorder, unspecified type: Secondary | ICD-10-CM

## 2023-08-27 DIAGNOSIS — F329 Major depressive disorder, single episode, unspecified: Secondary | ICD-10-CM

## 2023-08-27 DIAGNOSIS — Z6841 Body Mass Index (BMI) 40.0 and over, adult: Secondary | ICD-10-CM | POA: Diagnosis not present

## 2023-08-27 DIAGNOSIS — Z5900 Homelessness unspecified: Secondary | ICD-10-CM | POA: Diagnosis not present

## 2023-08-27 DIAGNOSIS — L918 Other hypertrophic disorders of the skin: Secondary | ICD-10-CM | POA: Diagnosis not present

## 2023-08-27 MED ORDER — BUPROPION HCL ER (XL) 300 MG PO TB24
300.0000 mg | ORAL_TABLET | Freq: Every day | ORAL | 1 refills | Status: DC
Start: 2023-08-27 — End: 2023-11-29
  Filled 2023-08-27: qty 30, 30d supply, fill #0
  Filled 2023-10-15: qty 30, 30d supply, fill #1

## 2023-08-27 NOTE — Assessment & Plan Note (Signed)
Here for removal Verbal and written informed consent obtained See procedure note below  Excision of Benign Skin Lesion Procedure Note PRE-OP DIAGNOSIS: multiple skin tags POST-OP DIAGNOSIS: Same  PROCEDURE: skin lesion excision Performing Physician: Janit Pagan  PROCEDURE:   Scissors   excision   The area surrounding the skin lesion was prepared and draped in the  usual sterile manner. The lesion was removed in the usual manner by the  biopsy method noted above. Hemostasis was assured. One skin tag on his right elbow and three tags on the right side of his neck were removed without complication.  Closure:    brown round bandage applied  Followup: The patient tolerated the procedure well without  complications.  Standard post-procedure care is explained and return  precautions are given.

## 2023-08-27 NOTE — Progress Notes (Addendum)
SUBJECTIVE:   CHIEF COMPLAINT / HPI:   Depression/ADHD:  He endorsed frustration with his current living situation and joblessness, making him feel like not wanting to keep living. He denies SI current and stated that he would never hurt himself. He is compliant with Wellbutrin 150 mg QD. SSRI/SNRI had not worked for him in the past. He said Wellbutrin is the only med that works. However, he has had yellowing of his eyes and hair discoloration with this in the past. He denies any skin change or eye or hair color change current on Wellbutrin. He asked if he could be placed on Ritalin for ADHD. He was referred for evaluation for this, but the Psychiatrist would not take his insurance. He otherwise sees his Psychiatrist - Dr. Link Snuffer every 3 weeks for 10 minutes.  Weight management: He is frustrated about not losing weight on Wegovy 0.25 mg weekly. He starts his dose of 0.5 mg weekly next week. This contributes to his depression.  Skin tags: He would like the removal of multiple skin tags on his skin.   PERTINENT  PMH / PSH: PMhx reviewed  OBJECTIVE:   BP 139/84   Pulse 85   Wt (!) 309 lb 6.4 oz (140.3 kg)   SpO2 99%   BMI 43.15 kg/m   Physical Exam Vitals reviewed.  Cardiovascular:     Rate and Rhythm: Normal rate and regular rhythm.     Heart sounds: Normal heart sounds. No murmur heard. Pulmonary:     Effort: Pulmonary effort is normal. No respiratory distress.     Breath sounds: Normal breath sounds. No wheezing.  Skin:    Comments: One small fleshy skin tag on the medial aspect of his right elbow  3 small skin tags on the right side of his neck  Psychiatric:        Attention and Perception: Attention normal.        Mood and Affect: Mood normal.        Speech: Speech normal.        Cognition and Memory: Cognition normal.     Comments: Flowsheet Row Office Visit from 08/27/2023 in [facility]  A Dept Of [place]. [facility]  PHQ-9 Total Score  21          ASSESSMENT/PLAN:   Depression Passive SI due to his social situation He said he would never hurt himself No HI or SI plan Discussed going up on Wellbutrin to 300 mg QD Plan to d/c medication if he noticed any change in his eye or hair color Plan to f/u in 2-3 weeks for LFT Continue F/U with Psych as planned He verbalized understanding and agreed with the plan VBCI referral for social issue  Adult ADHD I recommended Psych follow up for ADHD medication management Information provided about [facility] and he completed an online new patient form during this encounter. Hopefully they reach out to him soon   BMI 40.0-44.9, adult (HCC) Start Wegovy 0.5 mg weekly as planned F/U in 3-4 weeks for reassessment  Skin tag Here for removal Verbal and written informed consent obtained See procedure note below  Excision of Benign Skin Lesion Procedure Note PRE-OP DIAGNOSIS: multiple skin tags POST-OP DIAGNOSIS: Same  PROCEDURE: skin lesion excision Performing Physician: Janit Pagan  PROCEDURE:   Scissors   excision   The area surrounding the skin lesion was prepared and draped in the  usual sterile manner. The lesion was removed  in the usual manner by the  biopsy method noted above. Hemostasis was assured. One skin tag on his right elbow and three tags on the right side of his neck were removed without complication.  Closure:    brown round bandage applied  Followup: The patient tolerated the procedure well without  complications.  Standard post-procedure care is explained and return  precautions are given.     Janit Pagan, MD [facility]

## 2023-08-27 NOTE — Patient Instructions (Signed)
Skin Tag Removal Wound Care instructions  . Sometimes bandages are not necessary.  If a bandage is used, leave the original bandage on for 24 hours if possible.  If the bandage becomes soaked or soiled before that time, it is OK to remove it and examine the wound.  A small amount of post-operative bleeding is normal.  If excessive bleeding occurs, remove the bandage, place gauze over the site and apply continuous pressure (no peeking) over the area for 20-30 minutes.  If this does not stop the bleeding, try again for 40 minutes.  If this does not work, please call our clinic as soon as possible (even if after hours).    . Once a day, cleanse the wound with soap and water.  If a thick crust develops you may use a Q-tip dipped into dilute hydrogen peroxide (mix 1:1 with water) to dissolve it.  Hydrogen peroxide can slow the healing process, so use it only as needed.  After washing, apply Vaseline jelly or Polysporin ointment.  For best healing, the wound should be covered with a layer of ointment at all times.  This may mean re-applying the ointment several times a day.  For open wounds, continue until it has healed.    . If you have any swelling, keep the area elevated.  . Some redness, tenderness and white or yellow material in the wound is normal healing.  If the area becomes very sore and red, or develops a thick yellow-green material (pus), it may be infected; please notify us.    . Wound healing continues for up to one year following surgery.  It is not unusual to experience pain in the scar from time to time during the interval.  If the pain becomes severe or the scar thickens, you should notify the office.  A slight amount of redness in a scar is expected for the first six months.  After six months, the redness subsides and the scar will soften and fade.  The color difference becomes less noticeable with time.  If there are any problems, return for a post-op surgery check at your earliest  convenience.  . It is important to wear sunscreen daily with an SPF of 30 or higher over the treated sites and to wear sun-protective clothing.  . Please call our office for any questions or concerns.  

## 2023-08-27 NOTE — Assessment & Plan Note (Addendum)
Passive SI due to his social situation He said he would never hurt himself No HI or SI plan Discussed going up on Wellbutrin to 300 mg QD Plan to d/c medication if he noticed any change in his eye or hair color Plan to f/u in 2-3 weeks for LFT Continue F/U with Psych as planned He verbalized understanding and agreed with the plan VBCI referral for social issue

## 2023-08-27 NOTE — Assessment & Plan Note (Signed)
I recommended Psych follow up for ADHD medication management Information provided about [facility] and he completed an online new patient form during this encounter. Hopefully they reach out to him soon

## 2023-08-27 NOTE — Assessment & Plan Note (Signed)
Start Wegovy 0.5 mg weekly as planned F/U in 3-4 weeks for reassessment

## 2023-08-27 NOTE — Addendum Note (Signed)
Addended by: [name] on: 08/27/2023 08:33 PM   Modules accepted: Orders

## 2023-09-03 ENCOUNTER — Encounter (HOSPITAL_COMMUNITY): Payer: Self-pay | Admitting: Physician Assistant

## 2023-09-03 ENCOUNTER — Ambulatory Visit (INDEPENDENT_AMBULATORY_CARE_PROVIDER_SITE_OTHER): Payer: No Payment, Other | Admitting: Physician Assistant

## 2023-09-03 VITALS — BP 128/87 | HR 85 | Temp 97.7°F | Ht 71.0 in | Wt 309.6 lb

## 2023-09-03 DIAGNOSIS — Z8659 Personal history of other mental and behavioral disorders: Secondary | ICD-10-CM | POA: Diagnosis not present

## 2023-09-03 DIAGNOSIS — F332 Major depressive disorder, recurrent severe without psychotic features: Secondary | ICD-10-CM

## 2023-09-03 DIAGNOSIS — F411 Generalized anxiety disorder: Secondary | ICD-10-CM | POA: Diagnosis not present

## 2023-09-03 NOTE — Progress Notes (Unsigned)
BH MD/PA/NP OP Progress Note  09/03/2023 6:09 PM CORNELIS EADIE  MRN:  [id number]  Chief Complaint:  Chief Complaint  Patient presents with   Follow-up   HPI:   Nevil Anger is a 57 year old male with a past psychiatric history significant for major depressive disorder (recurrent episode, severe, without psychotic symptoms) and generalized anxiety disorder who presents to [facility] for follow up. During the last encounter, patient was being managed on the following psychiatric medication: Bupropion (Wellbutrin XL) 150 mg 24-hour tablet daily.  Patient presents today encounter stating that he continues to feel the same.  Patient reports that he is technically homeless but is currently living with another friend.  Patient also reports that he is currently not working.  He reports that he does have access to food stamps but is unable to pay his friend.  Patient reports that all he does is sleep.  He reports that he can sleep up to a total of 12 to 15 hours a day.  Patient endorses depression and rates his depression as 7-8 out of 10 with 10 being most severe.  Patient endorses depressive episodes every day.  Patient endorses the following depressive symptoms: feelings of sadness, lack of motivation, decreased concentration, decreased energy, increased sleep, feelings of guilt/worthlessness, and hopelessness.  Patient denies irritability.  Patient reports that his primary care provider recently upped his Wellbutrin from 150 mg to 300 mg daily.  A PHQ-9 screen was performed with the patient scoring a 20.  A GAD-7 screen was also performed with the patient scoring a 5.  Patient is alert and oriented x 4, calm, cooperative, and fully engaged in conversation during the encounter.  Patient endorses poor mood.  Patient denies suicidal or homicidal ideations.  He further denies auditory or visual hallucinations and does not appear to be responding to  internal/external stimuli.  Patient endorses increased sleep and receives on average 12 to 15 hours of sleep per night.  Patient endorses increased appetite and states that he eats multiple meals.  Patient denies alcohol consumption, tobacco use, or illicit drug use.  Visit Diagnosis:  No diagnosis found.   Past Psychiatric History:  Patient endorses the following psychiatric illnesses: ADHD, major depressive disorder, and generalized depression   Patient states that his last hospitalization occurred several years ago   Patient denies a past history of suicide attempts   Patient denies a past history of homicide attempts  Past Medical History:  Past Medical History:  Diagnosis Date   ADD (attention deficit disorder)    ADD (attention deficit disorder)    Allergy    Anemia, iron deficiency    Anxiety    Asthma    Blood transfusion    Constipation    Depression    Depression    Exertional dyspnea    secondary to anemia   GERD (gastroesophageal reflux disease)    Hiatal hernia    Narcolepsy    pt also reports episodes of passing out that are unrelated to narcolepsy about a year ago   Sleep apnea 01/2018   has a CPAP, "unable to use it"    Past Surgical History:  Procedure Laterality Date   COLONOSCOPY     UPPER GASTROINTESTINAL ENDOSCOPY      Family Psychiatric History:  Patient denies a family history of psychiatric illness   Family history of suicide attempt: Patient is not aware Family history of homicide attempts: Patient is not aware Family history of substance  abuse: Patient is unaware  Family History:  Family History  Problem Relation Age of Onset   Diabetes Paternal Grandfather    Cancer Neg Hx    Stroke Neg Hx    Breast cancer Neg Hx    Celiac disease Neg Hx    Cirrhosis Neg Hx    Clotting disorder Neg Hx    Colitis Neg Hx    Colon cancer Neg Hx    Colon polyps Neg Hx    Crohn's disease Neg Hx    Cystic fibrosis Neg Hx    Esophageal cancer Neg Hx     Heart disease Neg Hx    Hemochromatosis Neg Hx    Inflammatory bowel disease Neg Hx    Irritable bowel syndrome Neg Hx    Kidney disease Neg Hx    Liver cancer Neg Hx    Liver disease Neg Hx    Ovarian cancer Neg Hx    Pancreatic cancer Neg Hx    Prostate cancer Neg Hx    Rectal cancer Neg Hx    Stomach cancer Neg Hx    Ulcerative colitis Neg Hx    Uterine cancer Neg Hx    Wilson's disease Neg Hx     Social History:  Social History   Socioeconomic History   Marital status: Single    Spouse name: Not on file   Number of children: 0   Years of education: Not on file   Highest education level: Bachelor's degree (e.g., BA, AB, BS)  Occupational History   Occupation: student-Business  Tobacco Use   Smoking status: Never   Smokeless tobacco: Never  Vaping Use   Vaping status: Never Used  Substance and Sexual Activity   Alcohol use: No   Drug use: No   Sexual activity: Not Currently  Other Topics Concern   Not on file  Social History Narrative   Lives alone. Never married, no children.  Has two sister who live in [place].  Student at work study.  [employer].  Working toward Chief Technology Officer.  Previously worked in Geophysicist/field seismologist.  Was also previously homeless due to job loss and psychiatric exacerbation.   Social Drivers of Corporate investment banker Strain: High Risk (08/23/2023)   Overall Financial Resource Strain (CARDIA)    Difficulty of Paying Living Expenses: Very hard  Food Insecurity: Food Insecurity Present (08/23/2023)   Hunger Vital Sign    Worried About Running Out of Food in the Last Year: Often true    Ran Out of Food in the Last Year: Often true  Transportation Needs: Unmet Transportation Needs (08/23/2023)   PRAPARE - Administrator, Civil Service (Medical): No    Lack of Transportation (Non-Medical): Yes  Physical Activity: Inactive (08/23/2023)   Exercise Vital Sign    Days of Exercise per Week: 0 days     Minutes of Exercise per Session: 10 min  Stress: Stress Concern Present (08/23/2023)   [employer] of Occupational Health - Occupational Stress Questionnaire    Feeling of Stress : Very much  Social Connections: Socially Isolated (08/23/2023)   Social Connection and Isolation Panel [NHANES]    Frequency of Communication with Friends and Family: Once a week    Frequency of Social Gatherings with Friends and Family: Never    Attends Religious Services: Never    Database administrator or Organizations: No    Attends Banker Meetings: Never    Marital Status: Never married  Allergies: No Known Allergies  Metabolic Disorder Labs: Lab Results  Component Value Date   HGBA1C 5.4 06/29/2023   No results found for: "PROLACTIN" Lab Results  Component Value Date   CHOL 181 02/02/2023   TRIG 86 02/02/2023   HDL 51 02/02/2023   CHOLHDL 3.5 02/02/2023   VLDL 26 05/29/2014   LDLCALC 114 (H) 02/02/2023   LDLCALC 116 (H) 03/14/2021   Lab Results  Component Value Date   TSH 1.760 06/29/2023   TSH 1.610 12/19/2021    Therapeutic Level Labs: No results found for: "LITHIUM" No results found for: "VALPROATE" No results found for: "CBMZ"  Current Medications: Current Outpatient Medications  Medication Sig Dispense Refill   albuterol (VENTOLIN HFA) 108 (90 Base) MCG/ACT inhaler Inhale 1-2 puffs into the lungs every 6 (six) hours as needed for wheezing or shortness of breath. (Patient not taking: Reported on 08/27/2023) 18 g 3   buPROPion (WELLBUTRIN XL) 300 MG 24 hr tablet Take 1 tablet (300 mg total) by mouth daily. 30 tablet 1   pantoprazole (PROTONIX) 40 MG tablet Take 1 tablet (40 mg total) by mouth daily. 90 tablet 1   Semaglutide-Weight Management 0.5 MG/0.5ML SOAJ Inject 0.5 mg into the skin once a week. (Patient not taking: Reported on 08/27/2023) 2 mL 0   umeclidinium-vilanterol (ANORO ELLIPTA) 62.5-25 MCG/ACT AEPB Inhale 1 puff into the lungs daily. 90 each 4    No current facility-administered medications for this visit.     Musculoskeletal: Strength & Muscle Tone: within normal limits Gait & Station: normal Patient leans: N/A  Psychiatric Specialty Exam: Review of Systems  Psychiatric/Behavioral:  Positive for decreased concentration, dysphoric mood and sleep disturbance. Negative for hallucinations, self-injury and suicidal ideas. The patient is not nervous/anxious and is not hyperactive.     Blood pressure 128/87, pulse 85, temperature 97.7 F (36.5 C), temperature source Oral, height 5\' 11"  (1.803 m), weight (!) 309 lb 9.6 oz (140.4 kg), SpO2 97%.Body mass index is 43.18 kg/m.  General Appearance: Casual  Eye Contact:  Good  Speech:  Clear and Coherent and Normal Rate  Volume:  Normal  Mood:  Depressed  Affect:  Congruent  Thought Process:  Coherent  Orientation:  Full (Time, Place, and Person)  Thought Content: WDL   Suicidal Thoughts:  No  Homicidal Thoughts:  No  Memory:  Immediate;   Good Recent;   Good Remote;   Fair  Judgement:  Good  Insight:  Good  Psychomotor Activity:  Normal  Concentration:  Concentration: Good and Attention Span: Good  Recall:  Good  Fund of Knowledge: Good  Language: Good  Akathisia:  No  Handed:  Right  AIMS (if indicated): not done  Assets:  Communication Skills Desire for Improvement Housing Transportation  ADL's:  Intact  Cognition: WNL  Sleep:  Fair   Screenings: GAD-7    Flowsheet Row Clinical Support from 09/03/2023 in [facility] Clinical Support from 08/03/2023 in [facility] Clinical Support from 06/24/2023 in [facility] Office Visit from 05/13/2023 in [facility] Counselor from 03/31/2023 in [facility]  Total GAD-7 Score 5 9 7 10 18       PHQ2-9    Flowsheet Row Clinical Support from 09/03/2023 in [facility] Most recent reading at 09/03/2023  2:41 PM Office Visit from 08/27/2023 in [facility] [place]. [facility] Most  recent reading at 08/27/2023 10:20 AM Clinical Support from 08/03/2023 in [facility] Most recent reading at 08/03/2023  2:11 PM Office Visit from 08/03/2023 in [facility] Of [place]. [facility] Most recent reading at 08/03/2023  9:49 AM Office Visit from 07/13/2023 in [facility] - [facility] Of [place]. [facility] Most recent reading at 07/13/2023 11:07 AM  PHQ-2 Total Score 6 6 6 4 4   PHQ-9 Total Score 20 21 20 19 18       Flowsheet Row Clinical Support from 09/03/2023 in [name] Clinical Support from 08/03/2023 in [facility] ED from 06/26/2023 in [facility] at [facility]  C-SSRS RISK CATEGORY Moderate Risk Moderate Risk Moderate Risk        Assessment and Plan:   Parmveer Urbanek is a 57 year old male with a past psychiatric history significant for major depressive disorder (recurrent episode, severe, without psychotic symptoms) and generalized anxiety disorder who presents to [facility] for follow up.  Patient presents to the encounter stating that he continues to experience depressive symptoms.  He reports that his primary care provider recently upped his Wellbutrin from 150 mg to 300 mg but he continues to experience worsening depressive symptoms.  Patient reports that [facility] Has still not reached out to him regarding ADHD screening.  Provider informed patient that his information would be referred to [facility] for ADHD Screening.  Patient vocalized understanding.  Collaboration of Care: Collaboration of Care: Medication Management AEB provider managing patient's  psychiatric medications, Primary Care Provider AEB patient being seen by a family medicine provider, Psychiatrist AEB patient being followed by mental health provider at this facility, and Other provider involved in patient's care AEB patient being seen by oncology and pulmonology  Patient/Guardian was advised Release of Information must be obtained prior to any record release in order to collaborate their care with an outside provider. Patient/Guardian was advised if they have not already done so to contact the registration department to sign all necessary forms in order for [place] to release information regarding their care.   Consent: Patient/Guardian gives verbal consent for treatment and assignment of benefits for services provided during this visit. Patient/Guardian expressed understanding and agreed to proceed.   1. Severe episode of recurrent major depressive disorder, without psychotic features (HCC) (Primary) Patient is currently taking bupropion (Wellbutrin XL) 300 mg 24-hour tablet daily for the management of his major depressive disorder   2. GAD (generalized anxiety disorder)  3. History of ADHD Patient was referred to [facility] for ADHD screening  Patient to follow up in 6 weeks Provider spent a total of 21 minutes with the patient/reviewing patient's chart  Meta Hatchet, PA 09/03/2023, 6:09 PM

## 2023-09-07 NOTE — Telephone Encounter (Signed)
Pharmacy Patient Advocate Encounter  Received notification from [facility] that Prior Authorization for [facility] has been APPROVED from 07/18/23 to 01/20/24

## 2023-09-13 ENCOUNTER — Other Ambulatory Visit: Payer: Self-pay | Admitting: Family Medicine

## 2023-09-13 ENCOUNTER — Encounter: Payer: Self-pay | Admitting: Family Medicine

## 2023-09-13 ENCOUNTER — Other Ambulatory Visit (HOSPITAL_COMMUNITY): Payer: Self-pay

## 2023-09-14 ENCOUNTER — Other Ambulatory Visit (HOSPITAL_COMMUNITY): Payer: Self-pay

## 2023-09-14 MED ORDER — WEGOVY 1 MG/0.5ML ~~LOC~~ SOAJ
1.0000 mg | SUBCUTANEOUS | 0 refills | Status: DC
  Filled 2023-09-14 – 2023-09-15 (×3): qty 2, 28d supply, fill #0
  Filled ????-??-??: fill #0

## 2023-09-15 ENCOUNTER — Other Ambulatory Visit: Payer: Self-pay | Admitting: Licensed Clinical Social Worker

## 2023-09-15 ENCOUNTER — Encounter (HOSPITAL_COMMUNITY): Payer: Self-pay

## 2023-09-15 ENCOUNTER — Other Ambulatory Visit (HOSPITAL_COMMUNITY): Payer: Self-pay

## 2023-09-15 ENCOUNTER — Encounter: Payer: Self-pay | Admitting: Family Medicine

## 2023-09-15 NOTE — Patient Instructions (Signed)
 Visit Information  Mr. [name] was given information about Medicaid Managed Care team care coordination services as a part of their [employer] benefit. Lamar ORN Foxworth verbally consentedto engagement with the [facility] Managed Care team.   If you are experiencing a medical emergency, please call 911 or report to your local emergency department or urgent care.   If you have a non-emergency medical problem during routine business hours, please contact your provider's office and ask to speak with a nurse.   For questions related to your [employer] [facility] health plan, please call: [phone]  OR visit the member homepage at: [email or web address]  If you would like to schedule transportation through your [employer] [facility] plan, please call the following number at least 2 days in advance of your appointment: [phone]  If you are experiencing a behavioral health crisis, call the [employer] [place]  Behavioral Health Crisis Line at 1-5623289933 [phone]). The line is available 24 hours a day, seven days a week.  If you would like help to quit smoking, call 1-800-QUIT-NOW ((878)137-7074) OR Espaol: 1-855-Djelo-Ya (8-144-664-6430) o para ms informacin haga clic aqu or Text READY to 799-599 to register via text  Following is a copy of your plan of care:  Care Plan : LCSW Plan of Care  Updates made by Merlynn Lyle CROME, LCSW since 09/15/2023 12:00 AM     Problem: Anxiety Identification (Anxiety)      Goal: Anxiety Symptoms Identified   Start Date: 09/15/2023  Note:    Timeframe:  Short-Range Goal Priority:  High Start Date:   09/15/23             Expected End Date:  ongoing                     Follow Up Date-- [facility] BSW will call you on 09/30/23 at 9 and [facility] LCSW will call you 10/04/23 at 3:15  - keep 90 percent of scheduled appointments -consider counseling -consider bumping up your  self-care  -consider creating a stronger support network   Why is this important?             Combating depression may take some time.            If you don't feel better right away, don't give up on your treatment plan.    Current barriers:   Chronic Mental Health needs related to symptoms of depression, stress and anxiety. Patient requires Support, Education, Resources, Referrals, Advocacy, and Care Coordination, in order to meet Unmet Mental Health Needs and to find a therapist and psychiatrist. Homeless No income  Patient will implement clinical interventions discussed today to decrease symptoms of depression and increase knowledge and/or ability of: coping skills. Mental Health Concerns and Social Isolation Patient lacks knowledge of available community counseling agencies and resources.  Clinical Goal(s): verbalize understanding of plan for management of Anxiety, Depression, and Stress symptoms and demonstrate a reduction in symptoms. Patient will connect with a provider for ongoing mental health treatment, increase coping skills, healthy habits, self-management skills, and stress reduction        Patient Goals/Self-Care Activities: Take medications as prescribed   Attend all scheduled provider appointments Call pharmacy for medication refills 3-7 days in advance of running out of medications Perform all self care activities independently  Perform IADL's (shopping, preparing meals, housekeeping, managing finances) independently Call provider office for new concerns or questions Work with the social worker to address care coordination needs and will continue  to work with the clinical team to address health care and disease management related needs call 1-800-273-TALK (toll free, 24 hour hotline) If in a crisis, go to [facility] [address] [place] [phone]) call 911 if experiencing a Mental Health or Behavioral Health Crisis  Utilize  healthy coping skills and supportive resources discussed Contact PCP with any questions or concerns Keep 90 percent of counseling appointments Call your insurance provider for more information about your Enhanced Benefits  Check out counseling resources provided  Accept all calls from representative with Behavioral Health Providers in an effort to establish ongoing mental health counseling and supportive services. Incorporate into daily practice - relaxation techniques, deep breathing exercises, and mindfulness meditation strategies. Talk about feelings with friends, family members, spiritual advisor, etc. Contact LCSW directly [phone]), if you have questions, need assistance, or if additional social work needs are identified between now and our next scheduled telephone outreach call. Call 988 for mental health hotline/crisis line if needed (24/7 available) Try techniques to reduce symptoms of anxiety/negative thinking (deep breathing, distraction, positive self talk, etc)  - develop a personal safety plan - develop a plan to deal with triggers like holidays, anniversaries - exercise at least 2 to 3 times per week - have a plan for how to handle bad days - journal feelings and what helps to feel better or worse - spend time or talk with others at least 2 to 3 times per week - watch for early signs of feeling worse - begin personal counseling - call and visit an old friend - check out volunteer opportunities - join a support group - laugh; watch a funny movie or comedian - learn and use visualization or guided imagery - perform a random act of kindness - practice relaxation or meditation daily - start or continue a personal journal - practice positive thinking and self-talk -continue with compliance of taking medication  -identify current effective and ineffective coping strategies.  -implement positive self-talk in care to increase self-esteem, confidence and feelings of control.   -consider alternative and complementary therapy approaches such as meditation, mindfulness or yoga.  Call your insurance provider to gain education on benefits if desired Call your primary care doctor if symptoms get worse  -journaling, prayer, worship services, meditation or pastoral counseling.  -increase participation in pleasurable group activities such as hobbies, singing, sports or volunteering).  -consider the use of meditative movement therapy such as tai chi, yoga or qigong.  -start a regular daily exercise program based on tolerance, ability and patient choice to support positive thinking and activity    Follow Up Plan:  The patient has been provided with contact information for the care management team and has been advised to call with any mental health or health related questions or concerns.  The care management team will reach out to the patient again over the next 30 business  days.   If you are experiencing a Mental Health or Behavioral Health Crisis or need someone to talk to, please call the Suicide and Crisis Lifeline: 988    Patient Goals: Initial goal     24- Hour Availability:    [facility]  [address] [place], [place] [phone] Crisis [phone]   [facility] [phone]  [place] [facility]  [phone]    [facility]  [phone] (after hours)   Therapeutic Alternative/Mobile Crisis   [phone]   [place]  National  Suicide Hotline  617-401-9465 (TALK) OR 988   Call 988 for mental health emergencies   [facility]  [phone]);  [place] and [employer]  [phone]); [place], [place], [place], [place], [place], [place], [place]    [facility] Residents For 24/7 walk-up access to mental health services for [facility] children (4+), adolescents and adults, please visit  the [facility] located at [address] in [place], [place].  *[place] also provides comprehensive outpatient behavioral health services in a variety of locations around the [place].  Connect With Us  [address] [place], [place] [address] HelpLine: [phone] or [phone]  Get Directions  Find Help 24/7 By Phone Call our 24-hour HelpLine at 401-834-8025 or 778-230-2466 for immediate assistance for mental health and substance abuse issues.  Walk-In Help [place]: [facility] (Ages 4 and Up) [place]: Emergency Dept., [facility] Additional Resources National Hopeline Network: 1-800-SUICIDE The National Suicide Prevention Lifeline: 1-800-273-TALK     The following coping skill education was provided for stress relief and mental health management: When your car dies or a deadline looms, how do you respond? Long-term, low-grade or acute stress takes a serious toll on your body and mind, so don't ignore feelings of constant tension. Stress is a natural part of life. However, too much stress can harm our health, especially if it continues every day. This is chronic stress and can put you at risk for heart problems like heart disease and depression. Understand what's happening inside your body and learn simple coping skills to combat the negative impacts of everyday stressors.  Types of Stress There are two types of stress: Emotional - types of emotional stress are relationship problems, pressure at work, financial worries, experiencing discrimination or having a major life change. Physical - Examples of physical stress include being sick having pain, not sleeping well, recovery from an injury or having an alcohol and drug use disorder. Fight or Flight Sudden or ongoing stress activates your nervous system and floods your bloodstream with adrenaline and cortisol, two hormones that  raise blood pressure, increase heart rate and spike blood sugar. These changes pitch your body into a fight or flight response. That enabled our ancestors to outrun saber-toothed tigers, and it's helpful today for situations like dodging a car accident. But most modern chronic stressors, such as finances or a challenging relationship, keep your body in that heightened state, which hurts your health. Effects of Too Much Stress If constantly under stress, most of us  will eventually start to function less well.  Multiple studies link chronic stress to a higher risk of heart disease, stroke, depression, weight gain, memory loss and even premature death, so it's important to recognize the warning signals. Talk to your doctor about ways to manage stress if you're experiencing any of these symptoms: Prolonged periods of poor sleep. Regular, severe headaches. Unexplained weight loss or gain. Feelings of isolation, withdrawal or worthlessness. Constant anger and irritability. Loss of interest in activities. Constant worrying or obsessive thinking. Excessive alcohol or drug use. Inability to concentrate.  10 Ways to Cope with Chronic Stress It's key to recognize stressful situations as they occur because it allows you to focus on managing how you react. We all need to know when to close our eyes and take a deep breath when we feel tension rising. Use these tips to prevent or reduce chronic stress. 1. Rebalance Work and  Home All work and no play? If you're spending too much time at the office, intentionally put more dates in your calendar to enjoy time for fun, either alone or with others. 2. Get Regular Exercise Moving your body on a regular basis balances the nervous system and increases blood circulation, helping to flush out stress hormones. Even a daily 20-minute walk makes a difference. Any kind of exercise can lower stress and improve your mood ? just pick activities that you enjoy and make it a  regular habit. 3. Eat Well and Limit Alcohol and Stimulants Alcohol, nicotine and caffeine may temporarily relieve stress but have negative health impacts and can make stress worse in the long run. Well-nourished bodies cope better, so start with a good breakfast, add more organic fruits and vegetables for a well-balanced diet, avoid processed foods and sugar, try herbal tea and drink more water. 4. Connect with Supportive People Talking face to face with another person releases hormones that reduce stress. Lean on those good listeners in your life. 5. Carve Out Hobby Time Do you enjoy gardening, reading, listening to music or some other creative pursuit? Engage in activities that bring you pleasure and joy; research shows that reduces stress by almost half and lowers your heart rate, too. 6. Practice Meditation, Stress Reduction or Yoga Relaxation techniques activate a state of restfulness that counterbalances your body's fight-or-flight hormones. Even if this also means a 10-minute break in a long day: listen to music, read, go for a walk in nature, do a hobby, take a bath or spend time with a friend. Also consider doing a mindfulness exercise or try a daily deep breathing or imagery practice. Deep Breathing Slow, calm and deep breathing can help you relax. Try these steps to focus on your breathing and repeat as needed. Find a comfortable position and close your eyes. Exhale and drop your shoulders. Breathe in through your nose; fill your lungs and then your belly. Think of relaxing your body, quieting your mind and becoming calm and peaceful. Breathe out slowly through your nose, relaxing your belly. Think of releasing tension, pain, worries or distress. Repeat steps three and four until you feel relaxed. Imagery This involves using your mind to excite the senses -- sound, vision, smell, taste and feeling. This may help ease your stress. Begin by getting comfortable and then do some slow  breathing. Imagine a place you love being at. It could be somewhere from your childhood, somewhere you vacationed or just a place in your imagination. Feel how it is to be in the place you're imagining. Pay attention to the sounds, air, colors, and who is there with you. This is a place where you feel cared for and loved. All is well. You are safe. Take in all the smells, sounds, tastes and feelings. As you do, feel your body being nourished and healed. Feel the calm that surrounds you. Breathe in all the good. Breathe out any discomfort or tension. 7. Sleep Enough If you get less than seven to eight hours of sleep, your body won't tolerate stress as well as it could. If stress keeps you up at night, address the cause, and add extra meditation into your day to make up for the lost z's. Try to get seven to nine hours of sleep each night. Make a regular bedtime schedule. Keep your room dark and cool. Try to avoid computers, TV, cell phones and tablets before bed. 8. Bond with Connections You Enjoy Go out for a  coffee with a friend, chat with a neighbor, call a family member, visit with a clergy member, or even hang out with your pet. Clinical studies show that spending even a short time with a companion animal can cut anxiety levels almost in half. 9. Take a Vacation Getting away from it all can reset your stress tolerance by increasing your mental and emotional outlook, which makes you a happier, more productive person upon return. Leave your cellphone and laptop at home! 10. See a Counselor, Coach or Therapist If negative thoughts overwhelm your ability to make positive changes, it's time to seek professional help. Make an appointment today--your health and life are worth it.  Lyle Rung, BSW, MSW, LCSW Licensed Clinical Social Worker [employer]   [email or web address] Direct Dial: [phone]

## 2023-09-15 NOTE — Patient Outreach (Signed)
 Medicaid Managed Care Social Work Note  09/15/2023 Name:  [name] MRN:  [id number] DOB:  [birth year]  [name] is an 58 y.o. year old male who is a primary patient of Anders Otto DASEN, MD.  The Medicaid Managed Care Coordination team was consulted for assistance with:  Community Resources  Mental Health Counseling and Resources  [name] was given information about Medicaid Managed Care Coordination team services today. [name] Patient agreed to services and verbal consent obtained.  Engaged with patient  for by telephone forinitial visit in response to referral for case management and/or care coordination services.   Patient is participating in a Managed Medicaid Plan:  Yes  Assessments/Interventions:  Review of past medical history, allergies, medications, health status, including review of consultants reports, laboratory and other test data, was performed as part of comprehensive evaluation and provision of chronic care management services.  SDOH: (Social Drivers of Health) assessments and interventions performed: SDOH Interventions    Flowsheet Row Patient Outreach Telephone from 09/15/2023 in [place] POPULATION HE[facility] DEPARTMENT Chronic Care Management from 02/13/2022 in [facility] - A Dept Of [place]. [facility] Office Visit from 12/19/2021 in [facility] - A Dept Of [place]. [facility] Chronic Care Management from 05/06/2021 in [facility] - A Dept Of [place]. [facility] Office Visit from 09/14/2017 in [facility] - A Dept Of [place]. [facility]  SDOH Interventions       Food Insecurity Interventions -- Other (Comment)  [pt already connected with FNS,  is not interested in other resources] -- -- --  Housing Interventions Community Resources Provided Patient Refused  [pt is not interested in a shelter] -- -- --  Transportation Interventions -- -- -- Social Research Officer, Government --  Depression Interventions/Treatment  Currently on Treatment -- Referral to Psychiatry -- Currently on Treatment  Financial Strain Interventions -- -- -- [id number] Referral --  Stress Interventions Community Resources Provided -- -- -- --       Advanced Directives Status:  See Care Plan for related entries.  Care Plan                 No Known Allergies  Medications Reviewed Today   Medications were not reviewed in this encounter     Patient Active Problem List   Diagnosis Date Noted   Skin tag 08/03/2023   History of ADHD 08/03/2023   BMI 40.0-44.9, adult (HCC) 07/13/2023   GAD (generalized anxiety disorder) 03/31/2023   Paresthesia 03/12/2023   Hyperlipidemia 02/17/2023   Ankle swelling 02/02/2023   Orbital swelling 02/02/2023   Social problem 12/18/2022   Elevated BP without diagnosis of hypertension 02/10/2022   Asthma 12/19/2021   Renal cyst 12/17/2021   Homelessness 03/14/2021   Amnesia 02/09/2020   Hypogonadism in male 09/14/2017   OSA (obstructive sleep apnea) 05/29/2014   Iron  deficiency anemia 12/22/2013   Chronic fatigue 07/07/2013   Adult ADHD 07/07/2013   Depression 01/07/2012   GERD (gastroesophageal reflux disease) 01/07/2012    Conditions to be addressed/monitored per PCP order:  Anxiety and Depression  Care Plan : LCSW Plan of Care  Updates made by Merlynn Jeffery CROME, LCSW since 09/15/2023 12:00 AM     Problem: Anxiety Identification (Anxiety)      Goal: Anxiety Symptoms Identified   Start Date: 09/15/2023  Note:    Timeframe:  Short-Range Goal  Priority:  High Start Date:   09/15/23             Expected End Date:  ongoing                     Follow Up Date-- [facility] BSW will call you on 09/30/23 at 9 and [facility] LCSW will call you 10/04/23 at 3:15  - keep 90 percent of scheduled appointments -consider counseling -consider bumping up your self-care  -consider creating a stronger support network   Why is this important?              Combating depression may take some time.            If you don't feel better right away, don't give up on your treatment plan.    Current barriers:   Chronic Mental Health needs related to symptoms of depression, stress and anxiety. Patient requires Support, Education, Resources, Referrals, Advocacy, and Care Coordination, in order to meet Unmet Mental Health Needs and to find a therapist and psychiatrist. Homeless No income  Patient will implement clinical interventions discussed today to decrease symptoms of depression and increase knowledge and/or ability of: coping skills. Mental Health Concerns and Social Isolation Patient lacks knowledge of available community counseling agencies and resources.  Clinical Goal(s): verbalize understanding of plan for management of Anxiety, Depression, and Stress symptoms and demonstrate a reduction in symptoms. Patient will connect with a provider for ongoing mental health treatment, increase coping skills, healthy habits, self-management skills, and stress reduction        Clinical Interventions:  Assessed patient's previous and current treatment, coping skills, support system and barriers to care. Patient provided hx  Verbalization of feelings encouraged, motivational interviewing employed Emotional support provided, positive coping strategies explored. Establishing healthy boundaries emphasized and healthy self-care education provided Patient was educated on available mental health resources within their area that accept Medicaid and offer counseling and psychiatry. Patient was advised to contact the back of his insurance card for assistance with benefits as well. Patient is already active with psychiatry at [facility]. He has an initial appointment at [facility] where he will gain cognitive testing and therapy but he reports he was informed that he cannot enroll in program because he does not have a credit care to put on file which is required for  enrollment. [facility] LCSW and patient completed joint call to [facility] and spoke with staff. Staff is willing to make exception for patient. He was advised to enter MEDICAID into the intake paperwork where it ask for a credit card. Patient educated on the difference between therapy and psychiatry per patient request Email sent to patient today with available mental health resources within his area that accept Medicaid and offer the services that he is interested in. Email included instructions for scheduling at [facility] as well as some crisis support resources and [facility]'s walk in clinic hours.  Emotional support provided. CBT intervention implemented regarding being mentally fit by combating negative thinking and replacing it with uplifting support, hope and positivity. Patient reports that he is currently homeless but is residing with a friend currently. However, he will have to move out in a few weeks. Patient does not wish to go to a shelter but is aware of limited housing resources. [facility] BSW referral made for SDOH needs.  Patient reports significant worsening anxiety impacting his ability to function appropriately and carry out daily task. Assessed social determinant of health barriers LCSW provided education on relaxation  techniques such as meditation, deep breathing, massage, grounding exercises or yoga that can activate the body's relaxation response and ease symptoms of stress and anxiety. LCSW ask that when pt is struggling with difficult emotions and racing thoughts that they start this relaxation response process. LCSW provided extensive education on healthy coping skills for anxiety. SW used active and reflective listening, validated patient's feelings/concerns, and provided emotional support. Patient will work on implementing appropriate self-care habits into their daily routine such as: staying positive, writing a gratitude list, drinking water, staying active around the house, taking their  medications as prescribed, combating negative thoughts or emotions and staying connected with their family and friends. Positive reinforcement provided for this decision to work on this. LCSW provided education on healthy sleep hygiene and what that looks like. LCSW encouraged patient to implement a night time routine into their schedule that works best for them and that they are able to maintain. Advised patient to implement deep breathing/grounding/meditation/self-care exercises into their nightly routine to combat racing thoughts at night. LCSW encouraged patient to wake up at the same time each day, make their sleeping environment comfortable, exercise when able, to limit naps and to not eat or drink anything right before bed.  Motivational Interviewing employed Depression screen reviewed  PHQ2/ PHQ9 completed or reviewed  Mindfulness or Relaxation training provided Active listening / Reflection utilized  Advance Care and HCPOA education provided Emotional Support Provided Problem Solving /Task Center strategies reviewed Provided psychoeducation for mental health needs  Provided brief CBT  Reviewed mental health medications and discussed importance of compliance:  Quality of sleep assessed & Sleep Hygiene techniques promoted  Participation in counseling encouraged  Verbalization of feelings encouraged  Suicidal Ideation/Homicidal Ideation assessed: Patient denies SI/HI  Review resources, discussed options and provided patient information about  Mental Health Resources Inter-disciplinary care team collaboration (see longitudinal plan of care)  Patient Goals/Self-Care Activities: Take medications as prescribed   Attend all scheduled provider appointments Call pharmacy for medication refills 3-7 days in advance of running out of medications Perform all self care activities independently  Perform IADL's (shopping, preparing meals, housekeeping, managing finances) independently Call  provider office for new concerns or questions Work with the social worker to address care coordination needs and will continue to work with the clinical team to address health care and disease management related needs call 1-800-273-TALK (toll free, 24 hour hotline) If in a crisis, go to [facility] [address] [place] [phone]) call 911 if experiencing a Mental Health or Behavioral Health Crisis  Utilize healthy coping skills and supportive resources discussed Contact PCP with any questions or concerns Keep 90 percent of counseling appointments Call your insurance provider for more information about your Enhanced Benefits  Check out counseling resources provided  Accept all calls from representative with Behavioral Health Providers in an effort to establish ongoing mental health counseling and supportive services. Incorporate into daily practice - relaxation techniques, deep breathing exercises, and mindfulness meditation strategies. Talk about feelings with friends, family members, spiritual advisor, etc. Contact LCSW directly [phone]), if you have questions, need assistance, or if additional social work needs are identified between now and our next scheduled telephone outreach call. Call 988 for mental health hotline/crisis line if needed (24/7 available) Try techniques to reduce symptoms of anxiety/negative thinking (deep breathing, distraction, positive self talk, etc)  - develop a personal safety plan - develop a plan to deal with triggers like holidays, anniversaries - exercise at least 2 to  3 times per week - have a plan for how to handle bad days - journal feelings and what helps to feel better or worse - spend time or talk with others at least 2 to 3 times per week - watch for early signs of feeling worse - begin personal counseling - call and visit an old friend - check out volunteer opportunities - join a support group -  laugh; watch a funny movie or comedian - learn and use visualization or guided imagery - perform a random act of kindness - practice relaxation or meditation daily - start or continue a personal journal - practice positive thinking and self-talk -continue with compliance of taking medication  -identify current effective and ineffective coping strategies.  -implement positive self-talk in care to increase self-esteem, confidence and feelings of control.  -consider alternative and complementary therapy approaches such as meditation, mindfulness or yoga.  Call your insurance provider to gain education on benefits if desired Call your primary care doctor if symptoms get worse  -journaling, prayer, worship services, meditation or pastoral counseling.  -increase participation in pleasurable group activities such as hobbies, singing, sports or volunteering).  -consider the use of meditative movement therapy such as tai chi, yoga or qigong.  -start a regular daily exercise program based on tolerance, ability and patient choice to support positive thinking and activity    Follow Up Plan:  The patient has been provided with contact information for the care management team and has been advised to call with any mental health or health related questions or concerns.  The care management team will reach out to the patient again over the next 30 business  days.   If you are experiencing a Mental Health or Behavioral Health Crisis or need someone to talk to, please call the Suicide and Crisis Lifeline: 988    Patient Goals: Initial goal      Follow up:  Patient agrees to Care Plan and Follow-up.  Plan: The Managed Medicaid care management team will reach out to the patient again over the next 30 days.  [name], BSW, MSW, LCSW Licensed Clinical Social Worker [employer]   [email or web address] Direct Dial: [phone]

## 2023-09-17 ENCOUNTER — Ambulatory Visit: Payer: Medicaid Other | Admitting: Family Medicine

## 2023-09-20 ENCOUNTER — Other Ambulatory Visit (HOSPITAL_COMMUNITY): Payer: Self-pay

## 2023-09-20 ENCOUNTER — Encounter: Payer: Self-pay | Admitting: Family Medicine

## 2023-09-21 ENCOUNTER — Other Ambulatory Visit (HOSPITAL_COMMUNITY): Payer: Self-pay

## 2023-09-30 ENCOUNTER — Other Ambulatory Visit: Payer: Self-pay

## 2023-09-30 NOTE — Patient Instructions (Signed)
Visit Information  Mr. [name] was given information about Medicaid Managed Care team care coordination services as a part of their [employer] Medicaid benefit. [name] verbally consentedto engagement with the [facility] Managed Care team.   If you are experiencing a medical emergency, please call 911 or report to your local emergency department or urgent care.   If you have a non-emergency medical problem during routine business hours, please contact your provider's office and ask to speak with a nurse.   For questions related to your [employer] [facility] health plan, please call: [phone]  OR visit the member homepage at: [email or web address]  If you would like to schedule transportation through your [facility] plan, please call the following number at least 2 days in advance of your appointment: [phone]  If you are experiencing a behavioral health crisis, call the [facility] [facility] Crisis Line at [phone]). The line is available 24 hours a day, seven days a week.  If you would like help to quit smoking, call 1-800-QUIT-NOW (910-079-2603) OR Espaol: 1-855-Djelo-Ya (3-235-573-2202) o para ms informacin haga clic aqu or Text READY to 542-706 to register via text   Mr. [name] - following are the goals we discussed in your visit today:   Goals Addressed   None      Social Worker will follow up in 30 days.   [name], [name], MHA [facility]  Managed Medicaid Social Worker [phone]   Following is a copy of your plan of care:  There are no care plans that you recently modified to display for this patient.

## 2023-09-30 NOTE — Patient Outreach (Signed)
Medicaid Managed Care Social Work Note  09/30/2023 Name:  [name] MRN:  [id number] DOB:  [birth year]  [name] is an 58 y.o. year old male who is a primary [name] of [name], MD.  The Medicaid Managed Care Coordination team was consulted for assistance with:  Community Resources   [name] was given information about Medicaid Managed Care Coordination team services today. [name] [name] agreed to services and verbal consent obtained.  Engaged with [name]  for by telephone forfollow up visit in response to referral for case management and/or care coordination services.   [name] is participating in a Managed Medicaid Plan:  Yes  Assessments/Interventions:  Review of past medical history, allergies, medications, health status, including review of consultants reports, laboratory and other test data, was performed as part of comprehensive evaluation and provision of chronic care management services.  SDOH: (Social Drivers of Health) assessments and interventions performed: SDOH Interventions    Flowsheet Row [name] Outreach Telephone from 09/15/2023 in [place] POPULATION HEALTH DEPARTMENT Chronic Care Management from 02/13/2022 in [name] - A Dept Of [place]. [facility] Office Visit from 12/19/2021 in [facility] - A Dept Of [place]. [facility] Chronic Care Management from 05/06/2021 in [facility] - A Dept Of [place]. [facility] Office Visit from 09/14/2017 in [facility] - A Dept Of [place]. [facility]  SDOH Interventions       Food Insecurity Interventions -- Other (Comment)  [pt already connected with FNS,  is not interested in other resources] -- -- --  Housing Interventions Community Resources Provided [name] Refused  [pt is not interested in a shelter] -- -- --  Transportation Interventions -- -- -- Retail banker --  Depression  Interventions/Treatment  Currently on Treatment -- Referral to Psychiatry -- Currently on Treatment  Financial Strain Interventions -- -- -- [id number] Referral --  Stress Interventions Community Resources Provided -- -- -- --      BSW completed a telephone outreach with [name], [name] states [name] is homeless but staying on a friends couch [name] does not know when [name] will have to leave. [name] does not have any income and is receiving foodstamps. BSW and [name] agreed for resources for jobs and housing to be emailed to email on file.  Advanced Directives Status:  Not addressed in this encounter.  Care Plan                 No Known Allergies  Medications Reviewed Today   Medications were not reviewed in this encounter     [name] Active Problem List   Diagnosis Date Noted   Skin tag 08/03/2023   History of ADHD 08/03/2023   BMI 40.0-44.9, adult (HCC) 07/13/2023   GAD (generalized anxiety disorder) 03/31/2023   Paresthesia 03/12/2023   Hyperlipidemia 02/17/2023   Ankle swelling 02/02/2023   Orbital swelling 02/02/2023   Social problem 12/18/2022   Elevated BP without diagnosis of hypertension 02/10/2022   Asthma 12/19/2021   Renal cyst 12/17/2021   Homelessness 03/14/2021   Amnesia 02/09/2020   Hypogonadism in male 09/14/2017   OSA (obstructive sleep apnea) 05/29/2014   Iron deficiency anemia 12/22/2013   Chronic fatigue 07/07/2013   Adult ADHD 07/07/2013   Depression 01/07/2012   GERD (gastroesophageal reflux disease) 01/07/2012    Conditions to be addressed/monitored per PCP order:   community resources  There are no care plans that you recently modified to display for this [name].   Follow up:  [name] agrees to Care Plan and Follow-up.  Plan: The Managed Medicaid care management team will reach out to the [name] again over the next 30 days.  Date/time of next scheduled Social Work care management/care coordination outreach:  11/02/23  [name], [name], [facility]  Health  Managed [facility] Social Worker [phone]

## 2023-10-04 ENCOUNTER — Other Ambulatory Visit: Payer: Self-pay | Admitting: Licensed Clinical Social Worker

## 2023-10-04 NOTE — Patient Instructions (Signed)
Visit Information  Mr. Tejera was given information about Medicaid Managed Care team care coordination services as a part of their [employer] benefit. Belia Heman Willner verbally consentedto engagement with the [facility] Managed Care team.   If you are experiencing a medical emergency, please call 911 or report to your local emergency department or urgent care.   If you have a non-emergency medical problem during routine business hours, please contact your provider's office and ask to speak with a nurse.   For questions related to your [employer] [facility] health plan, please call: [phone]  OR visit the member homepage at: [email or web address]  If you would like to schedule transportation through your [facility] plan, please call the following number at least 2 days in advance of your appointment: [phone]  If you are experiencing a behavioral health crisis, call the [facility] Crisis Line at [phone]). The line is available 24 hours a day, seven days a week.  If you would like help to quit smoking, call 1-800-QUIT-NOW (908-218-3949) OR Espaol: 1-855-Djelo-Ya (6-644-034-7425) o para ms informacin haga clic aqu or Text READY to 956-387 to register via text  Following is a copy of your plan of care:  Care Plan : LCSW Plan of Care  Updates made by Gustavus Bryant, LCSW since 10/04/2023 12:00 AM     Problem: Anxiety Identification (Anxiety)      Goal: Anxiety Symptoms Identified   Start Date: 09/15/2023  Note:    Timeframe:  Short-Range Goal Priority:  High Start Date:   09/15/23             Expected End Date:  ongoing                     Goal met and closed as of 10/04/23.   - keep 90 percent of scheduled appointments -consider counseling -consider bumping up your self-care  -consider creating a stronger support network   Why  is this important?             Combating depression may take some time.            If you don't feel better right away, don't give up on your treatment plan.    Current barriers:   Chronic Mental Health needs related to symptoms of depression, stress and anxiety. Patient requires Support, Education, Resources, Referrals, Advocacy, and Care Coordination, in order to meet Unmet Mental Health Needs and to find a therapist and psychiatrist. Homeless No income  Patient will implement clinical interventions discussed today to decrease symptoms of depression and increase knowledge and/or ability of: coping skills. Mental Health Concerns and Social Isolation Patient lacks knowledge of available community counseling agencies and resources.  Clinical Goal(s): verbalize understanding of plan for management of Anxiety, Depression, and Stress symptoms and demonstrate a reduction in symptoms. Patient will connect with a provider for ongoing mental health treatment, increase coping skills, healthy habits, self-management skills, and stress reduction        Patient Goals/Self-Care Activities: Take medications as prescribed   Attend all scheduled provider appointments Call pharmacy for medication refills 3-7 days in advance of running out of medications Perform all self care activities independently  Perform IADL's (shopping, preparing meals, housekeeping, managing finances) independently Call provider office for new concerns or questions Work with the social worker to address care coordination needs and will continue to work with the clinical team to address health care and disease management  related needs call 1-800-273-TALK (toll free, 24 hour hotline) If in a crisis, go to [facility] [address] [place] [phone]) call 911 if experiencing a Mental Health or Behavioral Health Crisis  Utilize healthy coping skills and supportive resources discussed Contact  PCP with any questions or concerns Keep 90 percent of counseling appointments Call your insurance provider for more information about your Enhanced Benefits  Check out counseling resources provided  Accept all calls from representative with Behavioral Health Providers in an effort to establish ongoing mental health counseling and supportive services. Incorporate into daily practice - relaxation techniques, deep breathing exercises, and mindfulness meditation strategies. Talk about feelings with friends, family members, spiritual advisor, etc. Contact LCSW directly [phone]), if you have questions, need assistance, or if additional social work needs are identified between now and our next scheduled telephone outreach call. Call 988 for mental health hotline/crisis line if needed (24/7 available) Try techniques to reduce symptoms of anxiety/negative thinking (deep breathing, distraction, positive self talk, etc)  - develop a personal safety plan - develop a plan to deal with triggers like holidays, anniversaries - exercise at least 2 to 3 times per week - have a plan for how to handle bad days - journal feelings and what helps to feel better or worse - spend time or talk with others at least 2 to 3 times per week - watch for early signs of feeling worse - begin personal counseling - call and visit an old friend - check out volunteer opportunities - join a support group - laugh; watch a funny movie or comedian - learn and use visualization or guided imagery - perform a random act of kindness - practice relaxation or meditation daily - start or continue a personal journal - practice positive thinking and self-talk -continue with compliance of taking medication  -identify current effective and ineffective coping strategies.  -implement positive self-talk in care to increase self-esteem, confidence and feelings of control.  -consider alternative and complementary therapy approaches such  as meditation, mindfulness or yoga.  Call your insurance provider to gain education on benefits if desired Call your primary care doctor if symptoms get worse  -journaling, prayer, worship services, meditation or pastoral counseling.  -increase participation in pleasurable group activities such as hobbies, singing, sports or volunteering).  -consider the use of meditative movement therapy such as tai chi, yoga or qigong.  -start a regular daily exercise program based on tolerance, ability and patient choice to support positive thinking and activity    Follow Up Plan:  The patient has been provided with contact information for the care management team and has been advised to call with any mental health or health related questions or concerns.  The care management team will reach out to the patient again over the next 30 business  days.   If you are experiencing a Mental Health or Behavioral Health Crisis or need someone to talk to, please call the Suicide and Crisis Lifeline: 988      24- Hour Availability:    [facility]  [address] [place], [place] [phone] Crisis [phone]   [facility] [phone]  [place] [facility]  [phone]    [facility]  [phone] (after hours)   Therapeutic Alternative/Mobile Crisis   [phone]   [place] National Suicide Hotline  202 264 4378 (TALK) OR 988   Call 988 for mental health emergencies   [place] Crisis  Line  (531)562-1812);  [place] and [employer]  [phone]); [place], [place], [place], [place], [place], [place], [place]    [place] [facility] for [facility] Residents For 24/7 walk-up access to mental health services for [facility] children (4+), adolescents and adults, please visit the [facility] located at [address] in [place],  [place].  *[place] also provides comprehensive outpatient behavioral health services in a variety of locations around the [place].  Connect With [place] [address] [place], [place] [address] HelpLine: (915)181-7048 or 1-(607)684-9117  Get Directions  Find Help 24/7 By Phone Call our 24-hour HelpLine at 2624213607 or 513-091-7109 for immediate assistance for mental health and substance abuse issues.  Walk-In Help [place]: [facility] (Ages 4 and Up) [place]: Emergency Dept., [facility] Additional Resources National Hopeline Network: 1-800-SUICIDE The National Suicide Prevention Lifeline: 1-800-273-TALK     The following coping skill education was provided for stress relief and mental health management: "When your car dies or a deadline looms, how do you respond? Long-term, low-grade or acute stress takes a serious toll on your body and mind, so don't ignore feelings of constant tension. Stress is a natural part of life. However, too much stress can harm our health, especially if it continues every day. This is chronic stress and can put you at risk for heart problems like heart disease and depression. Understand what's happening inside your body and learn simple coping skills to combat the negative impacts of everyday stressors.  Types of Stress There are two types of stress: Emotional - types of emotional stress are relationship problems, pressure at work, financial worries, experiencing discrimination or having a major life change. Physical - Examples of physical stress include being sick having pain, not sleeping well, recovery from an injury or having an alcohol and drug use disorder. Fight or Flight Sudden or ongoing stress activates your nervous system and floods your bloodstream with adrenaline and cortisol, two hormones that raise blood pressure, increase heart rate and spike blood sugar. These changes pitch your  body into a fight or flight response. That enabled our ancestors to outrun saber-toothed tigers, and it's helpful today for situations like dodging a car accident. But most modern chronic stressors, such as finances or a challenging relationship, keep your body in that heightened state, which hurts your health. Effects of Too Much Stress If constantly under stress, most of [place] will eventually start to function less well.  Multiple studies link chronic stress to a higher risk of heart disease, stroke, depression, weight gain, memory loss and even premature death, so it's important to recognize the warning signals. Talk to your doctor about ways to manage stress if you're experiencing any of these symptoms: Prolonged periods of poor sleep. Regular, severe headaches. Unexplained weight loss or gain. Feelings of isolation, withdrawal or worthlessness. Constant anger and irritability. Loss of interest in activities. Constant worrying or obsessive thinking. Excessive alcohol or drug use. Inability to concentrate.  10 Ways to Cope with Chronic Stress It's key to recognize stressful situations as they occur because it allows you to focus on managing how you react. We all need to know when to close our eyes and take a deep breath when we feel tension rising. Use these tips to prevent or reduce chronic stress. 1. Rebalance Work and Home All work and no play? If you're spending too much time at the office, intentionally put more dates  in your calendar to enjoy time for fun, either alone or with others. 2. Get Regular Exercise Moving your body on a regular basis balances the nervous system and increases blood circulation, helping to flush out stress hormones. Even a daily 20-minute walk makes a difference. Any kind of exercise can lower stress and improve your mood ? just pick activities that you enjoy and make it a regular habit. 3. Eat Well and Limit Alcohol and Stimulants Alcohol, nicotine and caffeine  may temporarily relieve stress but have negative health impacts and can make stress worse in the long run. Well-nourished bodies cope better, so start with a good breakfast, add more organic fruits and vegetables for a well-balanced diet, avoid processed foods and sugar, try herbal tea and drink more water. 4. Connect with Supportive People Talking face to face with another person releases hormones that reduce stress. Lean on those good listeners in your life. 5. Carve Out Hobby Time Do you enjoy gardening, reading, listening to music or some other creative pursuit? Engage in activities that bring you pleasure and joy; research shows that reduces stress by almost half and lowers your heart rate, too. 6. Practice Meditation, Stress Reduction or Yoga Relaxation techniques activate a state of restfulness that counterbalances your body's fight-or-flight hormones. Even if this also means a 10-minute break in a long day: listen to music, read, go for a walk in nature, do a hobby, take a bath or spend time with a friend. Also consider doing a mindfulness exercise or try a daily deep breathing or imagery practice. Deep Breathing Slow, calm and deep breathing can help you relax. Try these steps to focus on your breathing and repeat as needed. Find a comfortable position and close your eyes. Exhale and drop your shoulders. Breathe in through your nose; fill your lungs and then your belly. Think of relaxing your body, quieting your mind and becoming calm and peaceful. Breathe out slowly through your nose, relaxing your belly. Think of releasing tension, pain, worries or distress. Repeat steps three and four until you feel relaxed. Imagery This involves using your mind to excite the senses -- sound, vision, smell, taste and feeling. This may help ease your stress. Begin by getting comfortable and then do some slow breathing. Imagine a place you love being at. It could be somewhere from your childhood, somewhere  you vacationed or just a place in your imagination. Feel how it is to be in the place you're imagining. Pay attention to the sounds, air, colors, and who is there with you. This is a place where you feel cared for and loved. All is well. You are safe. Take in all the smells, sounds, tastes and feelings. As you do, feel your body being nourished and healed. Feel the calm that surrounds you. Breathe in all the good. Breathe out any discomfort or tension. 7. Sleep Enough If you get less than seven to eight hours of sleep, your body won't tolerate stress as well as it could. If stress keeps you up at night, address the cause, and add extra meditation into your day to make up for the lost z's. Try to get seven to nine hours of sleep each night. Make a regular bedtime schedule. Keep your room dark and cool. Try to avoid computers, TV, cell phones and tablets before bed. 8. Bond with Connections You Enjoy Go out for a coffee with a friend, chat with a neighbor, call a family member, visit with a clergy member, or even  hang out with your pet. Clinical studies show that spending even a short time with a companion animal can cut anxiety levels almost in half. 9. Take a Vacation Getting away from it all can reset your stress tolerance by increasing your mental and emotional outlook, which makes you a happier, more productive person upon return. Leave your cellphone and laptop at home! 10. See a Counselor, Coach or Therapist If negative thoughts overwhelm your ability to make positive changes, it's time to seek professional help. Make an appointment today--your health and life are worth it."  Dickie La, BSW, MSW, LCSW Licensed Clinical Social Worker [employer]   [facility] [email or web address] Direct Dial: [phone]

## 2023-10-04 NOTE — Patient Outreach (Signed)
Medicaid Managed Care Social Work Note  10/04/2023 Name:  [name] MRN:  [id number] DOB:  03-26-1966  [name] is an 58 y.o. year old male who is a primary patient of [name], MD.  The Medicaid Managed Care Coordination team was consulted for assistance with:  Mental Health Counseling and Resources  Mr. Maslowski was given information about Medicaid Managed Care Coordination team services today. [name] Patient agreed to services and verbal consent obtained.  Engaged with patient  for by telephone forfollow up visit in response to referral for case management and/or care coordination services.   Patient is participating in a Managed Medicaid Plan:  Yes  Assessments/Interventions:  Review of past medical history, allergies, medications, health status, including review of consultants reports, laboratory and other test data, was performed as part of comprehensive evaluation and provision of chronic care management services.  SDOH: (Social Drivers of Health) assessments and interventions performed: SDOH Interventions    Flowsheet Row Patient Outreach Telephone from 10/04/2023 in [place] [facility] DEPART[facility] Patient Outreach Telephone from 09/15/2023 in [place] POPULATION HEALTH DEPARTMENT Chronic Care Management from 02/13/2022 in [facility] - [facility] Of [place]. [facility] Office Visit from 12/19/2021 in [facility] - [facility] Dept Of [place]. [facility] Chronic Care Management from 05/06/2021 in [facility] - [facility] Dept Of [place]. [facility] Office Visit from 09/14/2017 in [facility] - [facility] Dept Of [place]. [facility]  SDOH Interventions        Food Insecurity Interventions -- -- Other (Comment)  [pt already connected with FNS,  is not interested in other resources] -- -- --  Housing Interventions -- [employer] Provided Patient Refused  [pt is not interested  in a shelter] -- -- --  Transportation Interventions -- -- -- -- Retail banker --  Depression Interventions/Treatment  -- Currently on Treatment -- Referral to Psychiatry -- Currently on Treatment  Financial Strain Interventions -- -- -- -- [id number] Referral --  Stress Interventions Community Resources Provided, Provide Counseling [employer] Provided -- -- -- --       Advanced Directives Status:  See Care Plan for related entries.  Care Plan                 No Known Allergies  Medications Reviewed Today     Reviewed by Gustavus Bryant, LCSW (Social Worker) on 10/04/23 at 1459  Med List Status: <None>   Medication Order Taking? Sig Documenting Provider Last Dose Status Informant  albuterol (VENTOLIN HFA) 108 (90 Base) MCG/ACT inhaler [id number] No Inhale 1-2 puffs into the lungs every 6 (six) hours as needed for wheezing or shortness of breath.  Patient not taking: Reported on 08/27/2023   Tiffany Kocher, DO Not Taking Active   buPROPion (WELLBUTRIN XL) 300 MG 24 hr tablet [id number]  Take 1 tablet (300 mg total) by mouth daily. [name], MD  Active   pantoprazole (PROTONIX) 40 MG tablet [id number] No Take 1 tablet (40 mg total) by mouth daily. [name], MD Taking Active   Semaglutide-Weight Management [facility]) 1 MG/0.5ML [place] Broad [id number]  Inject 1 mg into the skin once a week. [name], MD  Active   umeclidinium-vilanterol [facility] ELLIPTA) 62.5-25 MCG/ACT AEPB [id number] No Inhale 1 puff into the lungs daily. Glenford Bayley, NP Taking Active  Patient Active Problem List   Diagnosis Date Noted   Skin tag 08/03/2023   History of ADHD 08/03/2023   BMI 40.0-44.9, adult (HCC) 07/13/2023   GAD (generalized anxiety disorder) 03/31/2023   Paresthesia 03/12/2023   Hyperlipidemia 02/17/2023   Ankle swelling 02/02/2023   Orbital swelling 02/02/2023   Social problem 12/18/2022   Elevated BP without diagnosis of hypertension  02/10/2022   Asthma 12/19/2021   Renal cyst 12/17/2021   Homelessness 03/14/2021   Amnesia 02/09/2020   Hypogonadism in male 09/14/2017   OSA (obstructive sleep apnea) 05/29/2014   Iron deficiency anemia 12/22/2013   Chronic fatigue 07/07/2013   Adult ADHD 07/07/2013   Depression 01/07/2012   GERD (gastroesophageal reflux disease) 01/07/2012    Conditions to be addressed/monitored per PCP order:  Anxiety  Care Plan : LCSW Plan of Care  Updates made by Gustavus Bryant, LCSW since 10/04/2023 12:00 AM     Problem: Anxiety Identification (Anxiety)      Goal: Anxiety Symptoms Identified   Start Date: 09/15/2023  Note:    Timeframe:  Short-Range Goal Priority:  High Start Date:   09/15/23             Expected End Date:  ongoing                     Goal met and closed as of 10/04/23.   - keep 90 percent of scheduled appointments -consider counseling -consider bumping up your self-care  -consider creating a stronger support network   Why is this important?             Combating depression may take some time.            If you don't feel better right away, don't give up on your treatment plan.    Current barriers:   Chronic Mental Health needs related to symptoms of depression, stress and anxiety. Patient requires Support, Education, Resources, Referrals, Advocacy, and Care Coordination, in order to meet Unmet Mental Health Needs and to find a therapist and psychiatrist. Homeless No income  Patient will implement clinical interventions discussed today to decrease symptoms of depression and increase knowledge and/or ability of: coping skills. Mental Health Concerns and Social Isolation Patient lacks knowledge of available community counseling agencies and resources.  Clinical Goal(s): verbalize understanding of plan for management of Anxiety, Depression, and Stress symptoms and demonstrate a reduction in symptoms. Patient will connect with a provider for ongoing mental health  treatment, increase coping skills, healthy habits, self-management skills, and stress reduction        Clinical Interventions:  Assessed patient's previous and current treatment, coping skills, support system and barriers to care. Patient provided hx  Verbalization of feelings encouraged, motivational interviewing employed Emotional support provided, positive coping strategies explored. Establishing healthy boundaries emphasized and healthy self-care education provided Patient was educated on available mental health resources within their area that accept Medicaid and offer counseling and psychiatry. Patient was advised to contact the back of his insurance card for assistance with benefits as well. Patient is already active with psychiatry at [facility]. He has an initial appointment at [facility] where he will gain cognitive testing and therapy but he reports he was informed that he cannot enroll in program because he does not have a credit care to put on file which is required for enrollment. [facility] LCSW and patient completed joint call to [facility] and spoke with staff. Staff is willing to make exception for patient. He  was advised to enter "MEDICAID" into the intake paperwork where it ask for a credit card. Patient educated on the difference between therapy and psychiatry per patient request Email sent to patient today with available mental health resources within his area that accept Medicaid and offer the services that he is interested in. Email included instructions for scheduling at [facility] as well as some crisis support resources and [facility]'s walk in clinic hours.  Emotional support provided. CBT intervention implemented regarding "being mentally fit" by combating negative thinking and replacing it with uplifting support, hope and positivity. Patient reports that he is currently homeless but is residing with a friend currently. However, he will have to move out in a few weeks. Patient does not wish to go to a  shelter but is aware of limited housing resources. [facility] BSW referral made for SDOH needs.  Patient reports significant worsening anxiety impacting his ability to function appropriately and carry out daily task. Assessed social determinant of health barriers LCSW provided education on relaxation techniques such as meditation, deep breathing, massage, grounding exercises or yoga that can activate the body's relaxation response and ease symptoms of stress and anxiety. LCSW ask that when pt is struggling with difficult emotions and racing thoughts that they start this relaxation response process. LCSW provided extensive education on healthy coping skills for anxiety. SW used active and reflective listening, validated patient's feelings/concerns, and provided emotional support. Patient will work on implementing appropriate self-care habits into their daily routine such as: staying positive, writing a gratitude list, drinking water, staying active around the house, taking their medications as prescribed, combating negative thoughts or emotions and staying connected with their family and friends. Positive reinforcement provided for this decision to work on this. LCSW provided education on healthy sleep hygiene and what that looks like. LCSW encouraged patient to implement a night time routine into their schedule that works best for them and that they are able to maintain. Advised patient to implement deep breathing/grounding/meditation/self-care exercises into their nightly routine to combat racing thoughts at night. LCSW encouraged patient to wake up at the same time each day, make their sleeping environment comfortable, exercise when able, to limit naps and to not eat or drink anything right before bed.  Motivational Interviewing employed Depression screen reviewed  PHQ2/ PHQ9 completed or reviewed  Mindfulness or Relaxation training provided Active listening / Reflection utilized  Advance Care and HCPOA  education provided Emotional Support Provided Problem Solving /Task Center strategies reviewed Provided psychoeducation for mental health needs  Provided brief CBT  Reviewed mental health medications and discussed importance of compliance:  Quality of sleep assessed & Sleep Hygiene techniques promoted  Participation in counseling encouraged  Verbalization of feelings encouraged  Suicidal Ideation/Homicidal Ideation assessed: Patient denies SI/HI  Review resources, discussed options and provided patient information about  Mental Health Resources Inter-disciplinary care team collaboration (see longitudinal plan of care) Patient completed session with [facility] BSW and successfully received community resources. Patient reports that he has his first initial appointments for [facility] treatment at [facility] and [facility] this week. He reports that he has already finished all of his intake paperwork for these appointments and denies any further clinical social work needs at this time. [facility] LCSW will sign off at this time but can get back involved at anytime if future needs arise. Patient is aware of local housing shelters and housing resources even though these are very limited for his county. He is also aware of his local crisis resources  in case an urgent need arises.   Patient Goals/Self-Care Activities: Take medications as prescribed   Attend all scheduled provider appointments Call pharmacy for medication refills 3-7 days in advance of running out of medications Perform all self care activities independently  Perform IADL's (shopping, preparing meals, housekeeping, managing finances) independently Call provider office for new concerns or questions Work with the social worker to address care coordination needs and will continue to work with the clinical team to address health care and disease management related needs call 1-800-273-TALK (toll free, 24 hour hotline) If in a crisis, go to  [facility] [address] [place] [phone]) call 911 if experiencing a Mental Health or Behavioral Health Crisis  Utilize healthy coping skills and supportive resources discussed Contact PCP with any questions or concerns Keep 90 percent of counseling appointments Call your insurance provider for more information about your Enhanced Benefits  Check out counseling resources provided  Accept all calls from representative with Behavioral Health Providers in an effort to establish ongoing mental health counseling and supportive services. Incorporate into daily practice - relaxation techniques, deep breathing exercises, and mindfulness meditation strategies. Talk about feelings with friends, family members, spiritual advisor, etc. Contact LCSW directly [phone]), if you have questions, need assistance, or if additional social work needs are identified between now and our next scheduled telephone outreach call. Call 988 for mental health hotline/crisis line if needed (24/7 available) Try techniques to reduce symptoms of anxiety/negative thinking (deep breathing, distraction, positive self talk, etc)  - develop a personal safety plan - develop a plan to deal with triggers like holidays, anniversaries - exercise at least 2 to 3 times per week - have a plan for how to handle bad days - journal feelings and what helps to feel better or worse - spend time or talk with others at least 2 to 3 times per week - watch for early signs of feeling worse - begin personal counseling - call and visit an old friend - check out volunteer opportunities - join a support group - laugh; watch a funny movie or comedian - learn and use visualization or guided imagery - perform a random act of kindness - practice relaxation or meditation daily - start or continue a personal journal - practice positive thinking and self-talk -continue with compliance of taking  medication  -identify current effective and ineffective coping strategies.  -implement positive self-talk in care to increase self-esteem, confidence and feelings of control.  -consider alternative and complementary therapy approaches such as meditation, mindfulness or yoga.  Call your insurance provider to gain education on benefits if desired Call your primary care doctor if symptoms get worse  -journaling, prayer, worship services, meditation or pastoral counseling.  -increase participation in pleasurable group activities such as hobbies, singing, sports or volunteering).  -consider the use of meditative movement therapy such as tai chi, yoga or qigong.  -start a regular daily exercise program based on tolerance, ability and patient choice to support positive thinking and activity    Follow Up Plan:  The patient has been provided with contact information for the care management team and has been advised to call with any mental health or health related questions or concerns.  The care management team will reach out to the patient again over the next 30 business  days.   If you are experiencing a Mental Health or Behavioral Health Crisis or need someone to talk to, please call the Suicide and Crisis Lifeline: [phone]  Follow up:  Patient requests no follow-up at this time.  Plan: The Managed Medicaid care management team is available to follow up with the patient after provider conversation with the patient regarding recommendation for care management engagement and subsequent re-referral to the care management team.   Dickie La, BSW, MSW, LCSW Licensed Clinical Social Worker [place]   [facility] [email or web address] Direct Dial: [phone]

## 2023-10-05 ENCOUNTER — Other Ambulatory Visit (HOSPITAL_COMMUNITY): Payer: Self-pay

## 2023-10-05 MED ORDER — METHYLPHENIDATE HCL 10 MG PO TABS
10.0000 mg | ORAL_TABLET | Freq: Two times a day (BID) | ORAL | 0 refills | Status: DC
Start: 2023-10-05 — End: 2024-04-25
  Filled 2023-10-05: qty 60, 30d supply, fill #0

## 2023-10-10 NOTE — Progress Notes (Signed)
 Cardiology Office Note:    Date:  10/13/2023   ID:  CARDIN NITSCHKE, DOB [birth year], MRN [id number]  PCP:  Anders Otto DASEN, MD   [facility] Providers Cardiologist:  None     Referring MD: Anders Otto DASEN, MD   Chief Complaint  Patient presents with   Shortness of Breath    History of Present Illness:    [name] is a 58 y.o. male is seen at the request of Dr Anders for evaluation of exertional dyspnea. He has a history of OSA on CPAP. Morbid obesity. He reports he was in excellent health in his 20s but then developed severe anemia and things have not been right since. He gained a lot of weight. Has OSA and narcolepsy. These issues have wrecked his life and he is now unemployed and homeless. He does have Medicaid. Prior evaluation included upper EGD that did show esophageal erosions, ulcers and large Hiatal hernia. Colonoscopy was negative. PFTs showed some restriction and air trapping. Prior Echo in 2023 was normal. BNP was low.  He states he cannot function now. Extremely fatigued and SOB with any activity. No swelling. Has actually gained weight on Wegovy . Feels lifeless.   Past Medical History:  Diagnosis Date   ADD (attention deficit disorder)    ADD (attention deficit disorder)    Allergy    Anemia, iron  deficiency    Anxiety    Asthma    Blood transfusion    Constipation    Depression    Depression    Exertional dyspnea    secondary to anemia   GERD (gastroesophageal reflux disease)    Hiatal hernia    Narcolepsy    pt also reports episodes of passing out that are unrelated to narcolepsy about a year ago   Sleep apnea 01/2018   has a CPAP, unable to use it    Past Surgical History:  Procedure Laterality Date   COLONOSCOPY     UPPER GASTROINTESTINAL ENDOSCOPY      Current Medications: Current Meds  Medication Sig   albuterol  (VENTOLIN  HFA) 108 (90 Base) MCG/ACT inhaler Inhale 1-2 puffs into the lungs every 6 (six) hours as needed for  wheezing or shortness of breath.   buPROPion  (WELLBUTRIN  XL) 300 MG 24 hr tablet Take 1 tablet (300 mg total) by mouth daily.   methylphenidate  (RITALIN ) 10 MG tablet Take 1 tablet (10 mg total) by mouth 2 (two) times daily, in the morning and in the afternoon.   pantoprazole  (PROTONIX ) 40 MG tablet Take 1 tablet (40 mg total) by mouth daily.   Semaglutide -Weight Management (WEGOVY ) 1 MG/0.5ML SOAJ Inject 1 mg into the skin once a week.   umeclidinium-vilanterol (ANORO ELLIPTA ) 62.5-25 MCG/ACT AEPB Inhale 1 puff into the lungs daily.     Allergies:   Patient has no known allergies.   Social History   Socioeconomic History   Marital status: Single    Spouse name: Not on file   Number of children: 0   Years of education: Not on file   Highest education level: Bachelor's degree (e.g., BA, AB, BS)  Occupational History   Occupation: student-Business  Tobacco Use   Smoking status: Never   Smokeless tobacco: Never  Vaping Use   Vaping status: Never Used  Substance and Sexual Activity   Alcohol use: No   Drug use: No   Sexual activity: Not Currently  Other Topics Concern   Not on file  Social History Narrative   Lives  alone. Never married, no children.  Has two sister who live in [place].  Student at work study.  [employer].  Working toward [employer].  Previously worked in geophysicist/field seismologist.  Was also previously homeless due to job loss and psychiatric exacerbation.   Social Drivers of Health   Financial Resource Strain: High Risk (08/23/2023)   Overall Financial Resource Strain (CARDIA)    Difficulty of Paying Living Expenses: Very hard  Food Insecurity: Food Insecurity Present (08/23/2023)   Hunger Vital Sign    Worried About Running Out of Food in the Last Year: Often true    Ran Out of Food in the Last Year: Often true  Transportation Needs: Unmet Transportation Needs (08/23/2023)   PRAPARE - Administrator, Civil Service (Medical): No     Lack of Transportation (Non-Medical): Yes  Physical Activity: Inactive (08/23/2023)   Exercise Vital Sign    Days of Exercise per Week: 0 days    Minutes of Exercise per Session: 10 min  Stress: Stress Concern Present (10/04/2023)   [employer] of Occupational Health - Occupational Stress Questionnaire    Feeling of Stress : To some extent  Social Connections: Socially Isolated (08/23/2023)   Social Connection and Isolation Panel [NHANES]    Frequency of Communication with Friends and Family: Once a week    Frequency of Social Gatherings with Friends and Family: Never    Attends Religious Services: Never    Database Administrator or Organizations: No    Attends Engineer, Structural: Never    Marital Status: Never married     Family History: The patient's family history includes Diabetes in his paternal grandfather. There is no history of Cancer, Stroke, Breast cancer, Celiac disease, Cirrhosis, Clotting disorder, Colitis, Colon cancer, Colon polyps, Crohn's disease, Cystic fibrosis, Esophageal cancer, Heart disease, Hemochromatosis, Inflammatory bowel disease, Irritable bowel syndrome, Kidney disease, Liver cancer, Liver disease, Ovarian cancer, Pancreatic cancer, Prostate cancer, Rectal cancer, Stomach cancer, Ulcerative colitis, Uterine cancer, or Wilson's disease.  ROS:   Please see the history of present illness.     All other systems reviewed and are negative.  EKGs/Labs/Other Studies Reviewed:    The following studies were reviewed today:  EKG Interpretation Date/Time:  Wednesday October 13 2023 14:32:00 EST Ventricular Rate:  89 PR Interval:  158 QRS Duration:  100 QT Interval:  352 QTC Calculation: 428 R Axis:   61  Text Interpretation: Normal sinus rhythm Possible Inferior infarct , age undetermined When compared with ECG of 26-Jun-2023 10:01, No acute changes Confirmed by Lequisha Cammack [phone]) on 10/13/2023 2:35:13 PM   Echo 01/06/22: IMPRESSIONS      1. Left ventricular ejection fraction, by estimation, is 60 to 65%. The  left ventricle has normal function. The left ventricle has no regional  wall motion abnormalities. There is mild concentric left ventricular  hypertrophy. Left ventricular diastolic  parameters were normal. The average left ventricular global longitudinal  strain is -26.0 %. The global longitudinal strain is normal.   2. Right ventricular systolic function is normal. The right ventricular  size is normal. Tricuspid regurgitation signal is inadequate for assessing  PA pressure.   3. The mitral valve is normal in structure. No evidence of mitral valve  regurgitation. No evidence of mitral stenosis.   4. The aortic valve was not well visualized. Aortic valve regurgitation  is not visualized.   5. Aortic dilatation noted. There is moderate dilatation of the ascending  aorta, measuring 44  mm.   6. The inferior vena cava is normal in size with greater than 50%  respiratory variability, suggesting right atrial pressure of 3 mmHg.   Comparison(s): No significant change from prior study. 05/07/14 EF 65-70%.   EKG Interpretation Date/Time:  Wednesday October 13 2023 14:32:00 EST Ventricular Rate:  89 PR Interval:  158 QRS Duration:  100 QT Interval:  352 QTC Calculation: 428 R Axis:   61  Text Interpretation: Normal sinus rhythm Possible Inferior infarct , age undetermined When compared with ECG of 26-Jun-2023 10:01, No acute changes Confirmed by Everlynn Sagun [phone]) on 10/13/2023 2:35:13 PM    Recent Labs: 06/26/2023: ALT 18; BUN 26; Creatinine, Ser 1.07; Hemoglobin 10.3; Platelets 290; Potassium 4.4; Sodium 139 06/29/2023: BNP 28.7; TSH 1.760  Recent Lipid Panel    Component Value Date/Time   CHOL 181 02/02/2023 1708   TRIG 86 02/02/2023 1708   HDL 51 02/02/2023 1708   CHOLHDL 3.5 02/02/2023 1708   CHOLHDL 4.2 05/29/2014 0847   VLDL 26 05/29/2014 0847   LDLCALC 114 (H) 02/02/2023 1708     Risk  Assessment/Calculations:                Physical Exam:    VS:  BP 124/86   Pulse 100   Ht 5' 11 (1.803 m)   Wt (!) 308 lb 6.4 oz (139.9 kg)   SpO2 94%   BMI 43.01 kg/m     Wt Readings from Last 3 Encounters:  10/13/23 (!) 308 lb 6.4 oz (139.9 kg)  08/27/23 (!) 309 lb 6.4 oz (140.3 kg)  08/03/23 (!) 306 lb 8 oz (139 kg)     GEN:  Well nourished, morbidly obese.  in no acute distress HEENT: Normal NECK: No JVD; No carotid bruits LYMPHATICS: No lymphadenopathy CARDIAC: RRR, no murmurs, rubs, gallops RESPIRATORY:  Clear to auscultation without rales, wheezing or rhonchi  ABDOMEN: Soft, non-tender, non-distended MUSCULOSKELETAL:  No edema; No deformity  SKIN: Warm and dry NEUROLOGIC:  Alert and oriented x 3 PSYCHIATRIC:  Normal affect   ASSESSMENT:    1. DOE (dyspnea on exertion)    PLAN:    In order of problems listed above:  Dyspnea on exertion and marked fatigue. No cardiac etiology that would explain these symptoms. Echo was normal. Normal BNP. BP is normal. Prior chest CT showed no coronary calcification so very unlikely to have CAD. Suspect symptoms are multifactorial due to restrictive lung disease, severe anemia, OSA, morbid obesity and narcolepsy. I really have nothing further to add from a CV standpoint. Should pursue efforts at regular walking and weight loss.            Medication Adjustments/Labs and Tests Ordered: Current medicines are reviewed at length with the patient today.  Concerns regarding medicines are outlined above.  Orders Placed This Encounter  Procedures   EKG 12-Lead   No orders of the defined types were placed in this encounter.   Patient Instructions  Medication Instructions:  Continue same medications   Lab Work: None ordered   Testing/Procedures: None ordered   Follow-Up: At [facility], you and your health needs are our priority.  As part of our continuing mission to provide you with exceptional heart  care, we have created designated Provider Care Teams.  These Care Teams include your primary Cardiologist (physician) and Advanced Practice Providers (APPs -  Physician Assistants and Nurse Practitioners) who all work together to provide you with the care you need, when you need it.  We recommend signing up for the patient portal called MyChart.  Sign up information is provided on this After Visit Summary.  MyChart is used to connect with patients for Virtual Visits (Telemedicine).  Patients are able to view lab/test results, encounter notes, upcoming appointments, etc.  Non-urgent messages can be sent to your provider as well.   To learn more about what you can do with MyChart, go to [email or web address].    Your next appointment:  As Needed    Provider:  Dr.Pernell Lenoir          Signed, Johnnathan Hagemeister, MD  10/13/2023 2:57 PM    [facility]

## 2023-10-13 ENCOUNTER — Ambulatory Visit: Payer: Medicaid Other | Attending: Cardiology | Admitting: Cardiology

## 2023-10-13 ENCOUNTER — Encounter: Payer: Self-pay | Admitting: Cardiology

## 2023-10-13 VITALS — BP 124/86 | HR 100 | Ht 71.0 in | Wt 308.4 lb

## 2023-10-13 DIAGNOSIS — R0609 Other forms of dyspnea: Secondary | ICD-10-CM | POA: Diagnosis not present

## 2023-10-13 NOTE — Patient Instructions (Signed)
 Medication Instructions:  Continue same medications  Lab Work: None ordered   Testing/Procedures: None ordered   Follow-Up: At [facility], you and your health needs are our priority.  As part of our continuing mission to provide you with exceptional heart care, we have created designated Provider Care Teams.  These Care Teams include your primary Cardiologist (physician) and Advanced Practice Providers (APPs -  Physician Assistants and Nurse Practitioners) who all work together to provide you with the care you need, when you need it.  We recommend signing up for the patient portal called "MyChart".  Sign up information is provided on this After Visit Summary.  MyChart is used to connect with patients for Virtual Visits (Telemedicine).  Patients are able to view lab/test results, encounter notes, upcoming appointments, etc.  Non-urgent messages can be sent to your provider as well.   To learn more about what you can do with MyChart, go to [email or web address].    Your next appointment:  As Needed    Provider: Dr.Jordan

## 2023-10-14 ENCOUNTER — Ambulatory Visit (INDEPENDENT_AMBULATORY_CARE_PROVIDER_SITE_OTHER): Payer: No Payment, Other | Admitting: Physician Assistant

## 2023-10-14 VITALS — BP 135/87 | HR 105 | Temp 98.1°F | Ht 71.0 in | Wt 309.4 lb

## 2023-10-14 DIAGNOSIS — F332 Major depressive disorder, recurrent severe without psychotic features: Secondary | ICD-10-CM

## 2023-10-14 DIAGNOSIS — F411 Generalized anxiety disorder: Secondary | ICD-10-CM

## 2023-10-14 DIAGNOSIS — Z8659 Personal history of other mental and behavioral disorders: Secondary | ICD-10-CM

## 2023-10-15 ENCOUNTER — Encounter (HOSPITAL_COMMUNITY): Payer: Self-pay | Admitting: Physician Assistant

## 2023-10-15 ENCOUNTER — Ambulatory Visit (INDEPENDENT_AMBULATORY_CARE_PROVIDER_SITE_OTHER): Payer: Medicaid Other | Admitting: Family Medicine

## 2023-10-15 ENCOUNTER — Ambulatory Visit: Payer: Medicaid Other | Admitting: Family Medicine

## 2023-10-15 ENCOUNTER — Other Ambulatory Visit (HOSPITAL_COMMUNITY): Payer: Self-pay

## 2023-10-15 ENCOUNTER — Encounter: Payer: Self-pay | Admitting: Family Medicine

## 2023-10-15 VITALS — BP 127/80 | HR 90 | Ht 71.0 in | Wt 307.2 lb

## 2023-10-15 DIAGNOSIS — F909 Attention-deficit hyperactivity disorder, unspecified type: Secondary | ICD-10-CM | POA: Diagnosis not present

## 2023-10-15 DIAGNOSIS — Z6841 Body Mass Index (BMI) 40.0 and over, adult: Secondary | ICD-10-CM | POA: Diagnosis not present

## 2023-10-15 DIAGNOSIS — K219 Gastro-esophageal reflux disease without esophagitis: Secondary | ICD-10-CM | POA: Diagnosis not present

## 2023-10-15 DIAGNOSIS — F329 Major depressive disorder, single episode, unspecified: Secondary | ICD-10-CM | POA: Diagnosis present

## 2023-10-15 MED ORDER — WEGOVY 1.7 MG/0.75ML ~~LOC~~ SOAJ
1.7000 mg | SUBCUTANEOUS | 0 refills | Status: DC
Start: 2023-10-27 — End: 2023-11-29
  Filled 2023-10-27: qty 3, 28d supply, fill #0

## 2023-10-15 NOTE — Progress Notes (Signed)
 BH MD/PA/NP OP Progress Note  10/14/2023 1:30 PM [name]  MRN:  [id number]  Chief Complaint:  Chief Complaint  Patient presents with   Follow-up   HPI:   [name] is a 58 year old male with a past psychiatric history significant for major depressive disorder (recurrent episode, severe, without psychotic symptoms) and generalized anxiety disorder who presents to [facility] for follow up. During the last encounter, patient was being managed on the following psychiatric medication: Bupropion  (Wellbutrin  XL) 150 mg 24-hour tablet daily.  Patient presents to the encounter stating that he was able to attend his appointment for ADHD screening at [facility].  Patient reports that he was started on Ritalin  10 mg 2 times daily by his provider at [facility].  He reports that he started the medication a week ago.  When taking the medication, he reports that it takes 30 to 45 minutes to take effect and last for a couple of hours.  Since starting Ritalin , patient reports that the medication has not been helpful.  Patient reports that he recently saw his cardiologist and states that the encounter was eye opening for him.  Patient reports that the encounter was an eye opener because his cardiologist pointed out that he was plugged into every resource possible (social work, [employer], mental health services, and primary care services) but he was still homeless, overweight, and getting nowhere in life.  Patient exhibits frustrations due to not getting any help despite being plugged into several resources.  Patient continues to endorse depression and anxiety but denies wanting to be placed on any new psychiatric medications at this time.  He reports that he has hypersensitivity to anything that will make him drowsy.  Patient notes that he continues to struggle with foggy headedness and lethargy.  He also feels that his body is lifeless  due to the amount of stress he is under.  Patient notes that he has also been gaining weight despite being placed on was elevated.  Patient denies exercising regularly stating that he does not have a membership to any gym.  A PHQ-9 screen was performed with the patient scoring a 19.  A GAD-7 screen was also performed with the patient scoring a 5.  Patient is alert and oriented x 4, calm, cooperative, and fully engaged in conversation during the encounter.  Patient endorses depressed mood.  Patient exhibits depressed mood with congruent affect.  Patient denies suicidal or homicidal ideations.  He further denies auditory or visual hallucinations and does not appear to be responding to internal/external stimuli.  Patient endorses increased sleep and receives on average 10 to 11 hours of sleep per night.  Patient endorses good appetite and eats on average 3 meals per day.  Patient denies alcohol consumption, tobacco use, or illicit drug use.  Visit Diagnosis:    ICD-10-CM   1. Severe episode of recurrent major depressive disorder, without psychotic features (HCC)  F33.2     2. GAD (generalized anxiety disorder)  F41.1     3. History of ADHD  Z86.59        Past Psychiatric History:  Patient endorses the following psychiatric illnesses: ADHD, major depressive disorder, and generalized depression   Patient states that his last hospitalization occurred several years ago   Patient denies a past history of suicide attempts   Patient denies a past history of homicide attempts  Past Medical History:  Past Medical History:  Diagnosis Date   ADD (  attention deficit disorder)    ADD (attention deficit disorder)    Allergy    Anemia, iron  deficiency    Anxiety    Asthma    Blood transfusion    Constipation    Depression    Depression    Exertional dyspnea    secondary to anemia   GERD (gastroesophageal reflux disease)    Hiatal hernia    Narcolepsy    pt also reports episodes of passing out  that are unrelated to narcolepsy about a year ago   Sleep apnea 01/2018   has a CPAP, unable to use it    Past Surgical History:  Procedure Laterality Date   COLONOSCOPY     UPPER GASTROINTESTINAL ENDOSCOPY      Family Psychiatric History:  Patient denies a family history of psychiatric illness   Family history of suicide attempt: Patient is not aware Family history of homicide attempts: Patient is not aware Family history of substance abuse: Patient is unaware  Family History:  Family History  Problem Relation Age of Onset   Diabetes Paternal Grandfather    Cancer Neg Hx    Stroke Neg Hx    Breast cancer Neg Hx    Celiac disease Neg Hx    Cirrhosis Neg Hx    Clotting disorder Neg Hx    Colitis Neg Hx    Colon cancer Neg Hx    Colon polyps Neg Hx    Crohn's disease Neg Hx    Cystic fibrosis Neg Hx    Esophageal cancer Neg Hx    Heart disease Neg Hx    Hemochromatosis Neg Hx    Inflammatory bowel disease Neg Hx    Irritable bowel syndrome Neg Hx    Kidney disease Neg Hx    Liver cancer Neg Hx    Liver disease Neg Hx    Ovarian cancer Neg Hx    Pancreatic cancer Neg Hx    Prostate cancer Neg Hx    Rectal cancer Neg Hx    Stomach cancer Neg Hx    Ulcerative colitis Neg Hx    Uterine cancer Neg Hx    Wilson's disease Neg Hx     Social History:  Social History   Socioeconomic History   Marital status: Single    Spouse name: Not on file   Number of children: 0   Years of education: Not on file   Highest education level: Bachelor's degree (e.g., BA, AB, BS)  Occupational History   Occupation: student-Business  Tobacco Use   Smoking status: Never   Smokeless tobacco: Never  Vaping Use   Vaping status: Never Used  Substance and Sexual Activity   Alcohol use: No   Drug use: No   Sexual activity: Not Currently  Other Topics Concern   Not on file  Social History Narrative   Lives alone. Never married, no children.  Has two sister who live in [place].   Student at work study.  [employer].  Working toward [employer].  Previously worked in geophysicist/field seismologist.  Was also previously homeless due to job loss and psychiatric exacerbation.   Social Drivers of Health   Financial Resource Strain: High Risk (08/23/2023)   Overall Financial Resource Strain (CARDIA)    Difficulty of Paying Living Expenses: Very hard  Food Insecurity: Food Insecurity Present (08/23/2023)   Hunger Vital Sign    Worried About Running Out of Food in the Last Year: Often true    Ran Out of  Food in the Last Year: Often true  Transportation Needs: Unmet Transportation Needs (08/23/2023)   PRAPARE - Transportation    Lack of Transportation (Medical): No    Lack of Transportation (Non-Medical): Yes  Physical Activity: Inactive (08/23/2023)   Exercise Vital Sign    Days of Exercise per Week: 0 days    Minutes of Exercise per Session: 10 min  Stress: Stress Concern Present (10/04/2023)   [employer] of Occupational Health - Occupational Stress Questionnaire    Feeling of Stress : To some extent  Social Connections: Socially Isolated (08/23/2023)   Social Connection and Isolation Panel [NHANES]    Frequency of Communication with Friends and Family: Once a week    Frequency of Social Gatherings with Friends and Family: Never    Attends Religious Services: Never    Database Administrator or Organizations: No    Attends Engineer, Structural: Never    Marital Status: Never married    Allergies: No Known Allergies  Metabolic Disorder Labs: Lab Results  Component Value Date   HGBA1C 5.4 06/29/2023   No results found for: PROLACTIN Lab Results  Component Value Date   CHOL 181 02/02/2023   TRIG 86 02/02/2023   HDL 51 02/02/2023   CHOLHDL 3.5 02/02/2023   VLDL 26 05/29/2014   LDLCALC 114 (H) 02/02/2023   LDLCALC 116 (H) 03/14/2021   Lab Results  Component Value Date   TSH 1.760 06/29/2023   TSH 1.610 12/19/2021     Therapeutic Level Labs: No results found for: LITHIUM No results found for: VALPROATE No results found for: CBMZ  Current Medications: Current Outpatient Medications  Medication Sig Dispense Refill   albuterol  (VENTOLIN  HFA) 108 (90 Base) MCG/ACT inhaler Inhale 1-2 puffs into the lungs every 6 (six) hours as needed for wheezing or shortness of breath. 18 g 3   buPROPion  (WELLBUTRIN  XL) 300 MG 24 hr tablet Take 1 tablet (300 mg total) by mouth daily. 30 tablet 1   methylphenidate  (RITALIN ) 10 MG tablet Take 1 tablet (10 mg total) by mouth 2 (two) times daily, in the morning and in the afternoon. 60 tablet 0   pantoprazole  (PROTONIX ) 40 MG tablet Take 1 tablet (40 mg total) by mouth daily. 90 tablet 1   Semaglutide -Weight Management (WEGOVY ) 1 MG/0.5ML SOAJ Inject 1 mg into the skin once a week. 2 mL 0   umeclidinium-vilanterol (ANORO ELLIPTA ) 62.5-25 MCG/ACT AEPB Inhale 1 puff into the lungs daily. 60 each 4   No current facility-administered medications for this visit.     Musculoskeletal: Strength & Muscle Tone: within normal limits Gait & Station: normal Patient leans: N/A  Psychiatric Specialty Exam: Review of Systems  Psychiatric/Behavioral:  Positive for decreased concentration, dysphoric mood and sleep disturbance. Negative for hallucinations, self-injury and suicidal ideas. The patient is not nervous/anxious and is not hyperactive.     Blood pressure 135/87, pulse (!) 105, temperature 98.1 F (36.7 C), temperature source Oral, height 5' 11 (1.803 m), weight (!) 309 lb 6.4 oz (140.3 kg), SpO2 98%.Body mass index is 43.15 kg/m.  General Appearance: Casual  Eye Contact:  Good  Speech:  Clear and Coherent and Normal Rate  Volume:  Normal  Mood:  Depressed  Affect:  Congruent  Thought Process:  Coherent  Orientation:  Full (Time, Place, and Person)  Thought Content: WDL   Suicidal Thoughts:  No  Homicidal Thoughts:  No  Memory:  Immediate;   Good Recent;    Good Remote;  Fair  Judgement:  Good  Insight:  Good  Psychomotor Activity:  Normal  Concentration:  Concentration: Good and Attention Span: Good  Recall:  Good  Fund of Knowledge: Good  Language: Good  Akathisia:  No  Handed:  Right  AIMS (if indicated): not done  Assets:  Communication Skills Desire for Improvement Housing Transportation  ADL's:  Intact  Cognition: WNL  Sleep:  Fair   Screenings: GAD-7    Flowsheet Row Clinical Support from 10/14/2023 in [facility] Clinical Support from 09/03/2023 in [facility] Clinical Support from 08/03/2023 in [facility] Clinical Support from 06/24/2023 in [facility] Office Visit from 05/13/2023 in [facility]  Total GAD-7 Score 5 5 9 7 10       PHQ2-9    Flowsheet Row Clinical Support from 10/14/2023 in [facility] Patient Outreach Telephone from 09/15/2023 in [place] [facility]PART[facility] Clinical Support from 09/03/2023 in [facility] Office Visit from 08/27/2023 in [facility] [place] Clinical Support from 08/03/2023 in [facility]  PHQ-2 Total Score 6 6 6 6 6   PHQ-9 Total Score 22 17 20 21 20       Flowsheet Row Clinical Support from 10/14/2023 in [facility] Clinical Support from 09/03/2023 in [facility] Clinical Support from 08/03/2023 in [facility]  C-SSRS RISK CATEGORY Moderate Risk Moderate Risk Moderate Risk        Assessment and Plan:   [name] is a 58 year old male with a past psychiatric history significant for major depressive disorder (recurrent episode, severe, without psychotic symptoms) and generalized anxiety disorder who presents to  [facility] for follow up.  Patient presents to the encounter stating that he was able to be screened for ADHD.  Since being screened for ADHD, patient reports that his provider has placed him on Ritalin  10 mg 3 times daily.  Despite being placed on Ritalin , patient reports that he still continues to experience issues with focus and concentration.  In addition to issues with focus and concentration, patient continues to endorse ongoing depression and anxiety.  Patient exhibited frustration during the encounter due to being plugged into several resources but not seeing any results.  Patient endorses stressors related to his ongoing depression/anxiety, his weight, financial instability, and homelessness.  Despite his ongoing depression and anxiety, patient denied medication management stating that he has a hypersensitivity to most medications resulting in drowsiness.  Patient to continue taking Wellbutrin  150 mg daily for the management of his depressive symptoms.  Patient refused to be scheduled for a follow-up appointment.  Patient informed provider that he would call this facility if he decides to have a future appointment.  Patient denies suicidal or homicidal ideations.  Patient denies being a danger to himself and was able to contract for safety.  Collaboration of Care: Collaboration of Care: Medication Management AEB provider managing patient's psychiatric medications, Primary Care Provider AEB patient being seen by a family medicine provider, Psychiatrist AEB patient being followed by mental health provider at this facility, and Other provider involved in patient's care AEB patient being seen by oncology and pulmonology  Patient/Guardian was advised Release of Information must be obtained prior to any record release in order to collaborate their care with an outside  provider. Patient/Guardian was advised if they have not already done so to contact the  registration department to sign all necessary forms in order for us  to release information regarding their care.   Consent: Patient/Guardian gives verbal consent for treatment and assignment of benefits for services provided during this visit. Patient/Guardian expressed understanding and agreed to proceed.   1. Severe episode of recurrent major depressive disorder, without psychotic features (HCC) (Primary) Patient to continue taking bupropion  (Wellbutrin  XL) 150 mg 24-hour tablet daily for the management of his major depressive disorder  2. GAD (generalized anxiety disorder)  3. History of ADHD Patient to continue taking Ritalin  10 mg 2 times daily for the management of his ADHD  Patient refused a follow-up appointment Provider spent a total of 21 minutes with the patient/reviewing patient's chart  Reginia FORBES Bolster, PA 10/14/2023 1:30 PM

## 2023-10-15 NOTE — Assessment & Plan Note (Signed)
 No acute change from his baseline, although he feels he is not getting a whole lot better He is connected with Psych and I advised him to discuss psychotherapy in addition to medication I will defer further medication management and adjustment to his Psychiatrist He agreed with the plan Regarding his social concerns - homeless and jobless, he wish to connect with [facility] and requested referral I called and was informed he can self refer I have sent him a MyChart message regarding this information

## 2023-10-15 NOTE — Assessment & Plan Note (Signed)
Managed by Psych

## 2023-10-15 NOTE — Patient Instructions (Signed)

## 2023-10-15 NOTE — Progress Notes (Addendum)
    SUBJECTIVE:   CHIEF COMPLAINT / HPI:   MDD: He is compliant with his Wellbutrin  XL 300 mg QD. He is connected with [name]. He has not stated Psych therapy. The patient his psychiatrist yesterday for his depression. He does not feel like he has improved a lot. He denies any SI or HI. He denies any skin or hair color changes since he started a high dose of Wellbutrin .   ADHD: He was recently started on Ritalin  by [facility]. He is uncertain if this medication is working or not, as he recently started this meds. No new concerns.   BMI management: He is compliant with his Wegovy  1 mg Qweek. He, however, feels he is not losing weight well enough. He does not get much exercise due to his chronic problems - depression, OSA, and SOB. However, he says he does not eat much.   GERD: Compliant with Protonix  40 mg QD. He feels his symptoms are worsening due to the Wegovy .    PERTINENT  PMH / PSH: PMHx reviewed  OBJECTIVE:   BP 127/80   Pulse 90   Ht 5' 11 (1.803 m)   Wt (!) 307 lb 3.2 oz (139.3 kg)   SpO2 96%   BMI 42.85 kg/m   Physical Exam Vitals and nursing note reviewed.  Cardiovascular:     Rate and Rhythm: Normal rate and regular rhythm.     Heart sounds: Normal heart sounds. No murmur heard. Pulmonary:     Effort: Pulmonary effort is normal. No respiratory distress.     Breath sounds: Normal breath sounds. No wheezing.  Abdominal:     General: Bowel sounds are normal. There is no distension.     Palpations: Abdomen is soft. There is no mass.     Tenderness: There is no abdominal tenderness.  Musculoskeletal:     Right lower leg: No edema.     Left lower leg: No edema.  Skin:    Coloration: Skin is not jaundiced.  Neurological:     Mental Status: He is oriented to person, place, and time.  Psychiatric:        Behavior: Behavior normal.        Thought Content: Thought content normal.      ASSESSMENT/PLAN:   Depression No acute change from his  baseline, although he feels he is not getting a whole lot better He is connected with Psych and I advised him to discuss psychotherapy in addition to medication I will defer further medication management and adjustment to his Psychiatrist He agreed with the plan Regarding his social concerns - homeless and jobless, he wish to connect with [facility] and requested referral I called and was informed he can self refer I have sent him a MyChart message regarding this information  Adult ADHD Managed by Psych  BMI 40.0-44.9, adult (HCC) Increase Wegovy  to 1.7 mg Med escribed  GERD (gastroesophageal reflux disease) Exacerbated by Mounjaro  Continue current dose of Protonix  Consider switching to Pepcid/ add on vs increase dose in the future Monitor for now He agreed with the plan   PCV20 vaccination discussed during this visit. He said he'd think about it.   Otto Fairly, MD [facility]

## 2023-10-15 NOTE — Assessment & Plan Note (Signed)
 Increase Wegovy  to 1.7 mg Med escribed

## 2023-10-15 NOTE — Assessment & Plan Note (Signed)
 Exacerbated by [name] Continue current dose of Protonix  Consider switching to Pepcid/ add on vs increase dose in the future Monitor for now He agreed with the plan

## 2023-10-16 ENCOUNTER — Encounter: Payer: Self-pay | Admitting: Family Medicine

## 2023-10-16 LAB — CMP14+EGFR
ALT: 13 [IU]/L (ref 0–44)
AST: 14 [IU]/L (ref 0–40)
Albumin: 4.1 g/dL (ref 3.8–4.9)
Alkaline Phosphatase: 121 [IU]/L (ref 44–121)
BUN/Creatinine Ratio: 16 (ref 9–20)
BUN: 18 mg/dL (ref 6–24)
Bilirubin Total: 0.2 mg/dL (ref 0.0–1.2)
CO2: 21 mmol/L (ref 20–29)
Calcium: 9.3 mg/dL (ref 8.7–10.2)
Chloride: 105 mmol/L (ref 96–106)
Creatinine, Ser: 1.11 mg/dL (ref 0.76–1.27)
Globulin, Total: 2.8 g/dL (ref 1.5–4.5)
Glucose: 88 mg/dL (ref 70–99)
Potassium: 4.8 mmol/L (ref 3.5–5.2)
Sodium: 141 mmol/L (ref 134–144)
Total Protein: 6.9 g/dL (ref 6.0–8.5)
eGFR: 77 mL/min/{1.73_m2} (ref >59)

## 2023-10-20 ENCOUNTER — Inpatient Hospital Stay (HOSPITAL_BASED_OUTPATIENT_CLINIC_OR_DEPARTMENT_OTHER): Payer: Medicaid Other | Admitting: Hematology and Oncology

## 2023-10-20 ENCOUNTER — Inpatient Hospital Stay: Payer: Medicaid Other | Attending: Hematology and Oncology

## 2023-10-20 ENCOUNTER — Other Ambulatory Visit: Payer: Self-pay | Admitting: Hematology and Oncology

## 2023-10-20 VITALS — BP 132/89 | HR 105 | Temp 98.2°F | Resp 17 | Wt 304.9 lb

## 2023-10-20 DIAGNOSIS — K259 Gastric ulcer, unspecified as acute or chronic, without hemorrhage or perforation: Secondary | ICD-10-CM | POA: Insufficient documentation

## 2023-10-20 DIAGNOSIS — R110 Nausea: Secondary | ICD-10-CM | POA: Diagnosis not present

## 2023-10-20 DIAGNOSIS — K2980 Duodenitis without bleeding: Secondary | ICD-10-CM | POA: Diagnosis not present

## 2023-10-20 DIAGNOSIS — K2091 Esophagitis, unspecified with bleeding: Secondary | ICD-10-CM | POA: Diagnosis not present

## 2023-10-20 DIAGNOSIS — K922 Gastrointestinal hemorrhage, unspecified: Secondary | ICD-10-CM | POA: Diagnosis present

## 2023-10-20 DIAGNOSIS — K648 Other hemorrhoids: Secondary | ICD-10-CM | POA: Insufficient documentation

## 2023-10-20 DIAGNOSIS — D500 Iron deficiency anemia secondary to blood loss (chronic): Secondary | ICD-10-CM

## 2023-10-20 DIAGNOSIS — N289 Disorder of kidney and ureter, unspecified: Secondary | ICD-10-CM | POA: Insufficient documentation

## 2023-10-20 DIAGNOSIS — K2970 Gastritis, unspecified, without bleeding: Secondary | ICD-10-CM | POA: Diagnosis not present

## 2023-10-20 DIAGNOSIS — K449 Diaphragmatic hernia without obstruction or gangrene: Secondary | ICD-10-CM | POA: Insufficient documentation

## 2023-10-20 LAB — RETIC PANEL
Immature Retic Fract: 17.1 % — ABNORMAL HIGH (ref 2.3–15.9)
RBC.: 5.6 MIL/uL (ref 4.22–5.81)
Retic Count, Absolute: 59.4 10*3/uL (ref 19.0–186.0)
Retic Ct Pct: 1.1 % (ref 0.4–3.1)
Reticulocyte Hemoglobin: 24.6 pg — ABNORMAL LOW (ref >27.9)

## 2023-10-20 LAB — IRON AND IRON BINDING CAPACITY (CC-WL,HP ONLY)
Iron: 25 ug/dL — ABNORMAL LOW (ref 45–182)
Saturation Ratios: 6 % — ABNORMAL LOW (ref 17.9–39.5)
TIBC: 454 ug/dL — ABNORMAL HIGH (ref 250–450)
UIBC: 429 ug/dL — ABNORMAL HIGH (ref 117–376)

## 2023-10-20 LAB — CMP (CANCER CENTER ONLY)
ALT: 11 U/L (ref 0–44)
AST: 14 U/L — ABNORMAL LOW (ref 15–41)
Albumin: 4.2 g/dL (ref 3.5–5.0)
Alkaline Phosphatase: 112 U/L (ref 38–126)
Anion gap: 6 (ref 5–15)
BUN: 16 mg/dL (ref 6–20)
CO2: 24 mmol/L (ref 22–32)
Calcium: 9.6 mg/dL (ref 8.9–10.3)
Chloride: 106 mmol/L (ref 98–111)
Creatinine: 1.25 mg/dL — ABNORMAL HIGH (ref 0.61–1.24)
GFR, Estimated: >60 mL/min (ref >60)
Glucose, Bld: 90 mg/dL (ref 70–99)
Potassium: 4.7 mmol/L (ref 3.5–5.1)
Sodium: 136 mmol/L (ref 135–145)
Total Bilirubin: 0.3 mg/dL (ref 0.0–1.2)
Total Protein: 7.7 g/dL (ref 6.5–8.1)

## 2023-10-20 LAB — CBC WITH DIFFERENTIAL (CANCER CENTER ONLY)
Abs Immature Granulocytes: 0.04 10*3/uL (ref 0.00–0.07)
Basophils Absolute: 0.1 10*3/uL (ref 0.0–0.1)
Basophils Relative: 1 %
Eosinophils Absolute: 0.4 10*3/uL (ref 0.0–0.5)
Eosinophils Relative: 4 %
HCT: 41 % (ref 39.0–52.0)
Hemoglobin: 12.8 g/dL — ABNORMAL LOW (ref 13.0–17.0)
Immature Granulocytes: 0 %
Lymphocytes Relative: 11 %
Lymphs Abs: 1 10*3/uL (ref 0.7–4.0)
MCH: 22.5 pg — ABNORMAL LOW (ref 26.0–34.0)
MCHC: 31.2 g/dL (ref 30.0–36.0)
MCV: 72.1 fL — ABNORMAL LOW (ref 80.0–100.0)
Monocytes Absolute: 0.8 10*3/uL (ref 0.1–1.0)
Monocytes Relative: 8 %
Neutro Abs: 7.4 10*3/uL (ref 1.7–7.7)
Neutrophils Relative %: 76 %
Platelet Count: 377 10*3/uL (ref 150–400)
RBC: 5.69 MIL/uL (ref 4.22–5.81)
RDW: 16.6 % — ABNORMAL HIGH (ref 11.5–15.5)
WBC Count: 9.8 10*3/uL (ref 4.0–10.5)
nRBC: 0 % (ref 0.0–0.2)

## 2023-10-20 LAB — FERRITIN: Ferritin: 6 ng/mL — ABNORMAL LOW (ref 24–336)

## 2023-10-20 NOTE — Progress Notes (Signed)
[facility] [phone]   [phone]  PROGRESS NOTE  Patient Care Team: [name], MD as PCP - General (Family Medicine)  Hematological/Oncological History  #Iron deficiency anemia 2/2 to Presume GI Bleeding 1) 03/14/2021: TIBC 349, Iron 30 (L), Iron saturation 9% (L), Ferritin 15 (L), Vitamin B12 537, Folate 7.4, WBC 8.6, Hgb 10.7 (L), MCV 74 (L), Plt 450.   2) 05/07/2021: Establish care with Georga Kaufmann PA-C  3) 05/26/2021-06/20/2021: Received IV venofer 200 mg once a week x 5 doses  4) 07/29/2021-08/26/2021: Received IV venofer 200 mg once a week x 5 doses  5) 10/13/2021: Received IV monoferric 1000 mg x 1 dose.   6) 10/28/2021: EGD showed esophagitis with bleeding, large haital hernia with multiple cameron ulcers, gastritis, duodenitis.Colonoscopy was incomplete due to poor prep. Evidence of non-bleeding internal hemorrhoids.   7) 11/18/2021: Received IV monoferric 1000 mg x 1 dose  8) 01/13/2022: Received IV monoferric 1000 mg x 1 dose  HISTORY OF PRESENTING ILLNESS:  [name] 58 y.o. male returns for follow-up for iron deficiency anemia.  His last visit was on 04/27/2023.  In the interim since his last visit he   On exam today, [name] reports he has been "terrible" in the interim since her last visit.  Has been suffering from fatigue with energy level of 2.5 out of 10.  He notes that he is having some shortness of breath but no lightheadedness or dizziness.  He notes that he gets quite short of breath very easily with minimal exertion.  Reports it can be embarrassing in social situations where he has to walk.  He notes that he is currently working with social work and has "all the resources has to offer" but still cannot get out of the situation.  His car was taken away by a tow company and he is currently living on the hospitality at a friend in a "terrible situation".  He notes that he has not been having any overt signs of  bleeding, bruising, or dark stools.  He reports his appetite is good and he is taking Wegovy but unfortunately has not lost any weight.  Overall he feels generally unwell. He denies fevers, chills, sweats, chest pain or cough. He has no other complaints. Rest of the ROS is below.   MEDICAL HISTORY:  Past Medical History:  Diagnosis Date   ADD (attention deficit disorder)    ADD (attention deficit disorder)    Allergy    Anemia, iron deficiency    Anxiety    Asthma    Blood transfusion    Constipation    Depression    Depression    Exertional dyspnea    secondary to anemia   GERD (gastroesophageal reflux disease)    Hiatal hernia    Narcolepsy    pt also reports episodes of passing out that are unrelated to narcolepsy about a year ago   Sleep apnea 01/2018   has a CPAP, "unable to use it"    SURGICAL HISTORY: Past Surgical History:  Procedure Laterality Date   COLONOSCOPY     UPPER GASTROINTESTINAL ENDOSCOPY      SOCIAL HISTORY: Social History   Socioeconomic History   Marital status: Single    Spouse name: Not on file   Number of children: 0   Years of education: Not on file   Highest education level: Bachelor's degree (e.g., BA, AB, BS)  Occupational History   Occupation: student-Business  Tobacco Use  Smoking status: Never   Smokeless tobacco: Never  Vaping Use   Vaping status: Never Used  Substance and Sexual Activity   Alcohol use: No   Drug use: No   Sexual activity: Not Currently  Other Topics Concern   Not on file  Social History Narrative   Lives alone. Never married, no children.  Has two sister who live in [place].  Student at work study.  [employer].  Working toward Chief Technology Officer.  Previously worked in Geophysicist/field seismologist.  Was also previously homeless due to job loss and psychiatric exacerbation.   Social Drivers of Health   Financial Resource Strain: High Risk (08/23/2023)   Overall Financial Resource Strain (CARDIA)     Difficulty of Paying Living Expenses: Very hard  Food Insecurity: Food Insecurity Present (08/23/2023)   Hunger Vital Sign    Worried About Running Out of Food in the Last Year: Often true    Ran Out of Food in the Last Year: Often true  Transportation Needs: Unmet Transportation Needs (08/23/2023)   PRAPARE - Administrator, Civil Service (Medical): No    Lack of Transportation (Non-Medical): Yes  Physical Activity: Inactive (08/23/2023)   Exercise Vital Sign    Days of Exercise per Week: 0 days    Minutes of Exercise per Session: 10 min  Stress: Stress Concern Present (10/04/2023)   [employer] of Occupational Health - Occupational Stress Questionnaire    Feeling of Stress : To some extent  Social Connections: Socially Isolated (08/23/2023)   Social Connection and Isolation Panel [NHANES]    Frequency of Communication with Friends and Family: Once a week    Frequency of Social Gatherings with Friends and Family: Never    Attends Religious Services: Never    Database administrator or Organizations: No    Attends Banker Meetings: Never    Marital Status: Never married  Intimate Partner Violence: Not At Risk (03/31/2023)   Humiliation, Afraid, Rape, and Kick questionnaire    Fear of Current or Ex-Partner: No    Emotionally Abused: No    Physically Abused: No    Sexually Abused: No    FAMILY HISTORY: Family History  Problem Relation Age of Onset   Diabetes Paternal Grandfather    Cancer Neg Hx    Stroke Neg Hx    Breast cancer Neg Hx    Celiac disease Neg Hx    Cirrhosis Neg Hx    Clotting disorder Neg Hx    Colitis Neg Hx    Colon cancer Neg Hx    Colon polyps Neg Hx    Crohn's disease Neg Hx    Cystic fibrosis Neg Hx    Esophageal cancer Neg Hx    Heart disease Neg Hx    Hemochromatosis Neg Hx    Inflammatory bowel disease Neg Hx    Irritable bowel syndrome Neg Hx    Kidney disease Neg Hx    Liver cancer Neg Hx    Liver disease Neg Hx     Ovarian cancer Neg Hx    Pancreatic cancer Neg Hx    Prostate cancer Neg Hx    Rectal cancer Neg Hx    Stomach cancer Neg Hx    Ulcerative colitis Neg Hx    Uterine cancer Neg Hx    Wilson's disease Neg Hx     ALLERGIES:  has no known allergies.  MEDICATIONS:  Current Outpatient Medications  Medication Sig Dispense Refill   albuterol (  VENTOLIN HFA) 108 (90 Base) MCG/ACT inhaler Inhale 1-2 puffs into the lungs every 6 (six) hours as needed for wheezing or shortness of breath. (Patient not taking: Reported on 10/15/2023) 18 g 3   buPROPion (WELLBUTRIN XL) 300 MG 24 hr tablet Take 1 tablet (300 mg total) by mouth daily. 30 tablet 1   methylphenidate (RITALIN) 10 MG tablet Take 1 tablet (10 mg total) by mouth 2 (two) times daily, in the morning and in the afternoon. 60 tablet 0   pantoprazole (PROTONIX) 40 MG tablet Take 1 tablet (40 mg total) by mouth daily. 90 tablet 1   [START ON 10/27/2023] Semaglutide-Weight Management (WEGOVY) 1.7 MG/0.75ML SOAJ Inject 1.7 mg into the skin once a week. Start after your 4th week of 1mg  dose 3 mL 0   umeclidinium-vilanterol (ANORO ELLIPTA) 62.5-25 MCG/ACT AEPB Inhale 1 puff into the lungs daily. 60 each 4   No current facility-administered medications for this visit.    REVIEW OF SYSTEMS:   Constitutional: ( - ) fevers, ( - )  chills , ( - ) night sweats Eyes: ( - ) blurriness of vision, ( - ) double vision, ( - ) watery eyes Ears, nose, mouth, throat, and face: ( - ) mucositis, ( - ) sore throat Respiratory: ( - ) cough, ( + ) dyspnea, ( - ) wheezes Cardiovascular: ( - ) palpitation, ( - ) chest discomfort, ( - ) lower extremity swelling Gastrointestinal:  ( +) nausea, ( - ) heartburn, ( - ) change in bowel habits Skin: ( - ) abnormal skin rashes Lymphatics: ( - ) new lymphadenopathy, ( - ) easy bruising Neurological: ( - ) numbness, ( - ) tingling, ( - ) new weaknesses Behavioral/Psych: ( - ) mood change, ( - ) new changes  All other systems  were reviewed with the patient and are negative.  PHYSICAL EXAMINATION: ECOG PERFORMANCE STATUS: 2 - Symptomatic, <50% confined to bed  Vitals:   10/20/23 1156  BP: 132/89  Pulse: (!) 105  Resp: 17  Temp: 98.2 F (36.8 C)  SpO2: 95%     Filed Weights   10/20/23 1156  Weight: (!) 304 lb 14.4 oz (138.3 kg)   GENERAL: well appearing male in NAD, obese SKIN: skin color, texture, turgor are normal, no rashes or significant lesions EYES: conjunctiva are pink and non-injected, sclera clear LUNGS: clear to auscultation and percussion with normal breathing effort HEART: Regular rhythm and rate. No murmurs and no lower extremity edema. Musculoskeletal: no cyanosis of digits and no clubbing  PSYCH: alert & oriented x 3, fluent speech NEURO: no focal motor/sensory deficits  LABORATORY DATA:  I have reviewed the data as listed    Latest Ref Rng & Units 10/20/2023   11:37 AM 06/26/2023   10:36 AM 06/23/2023    1:42 PM  CBC  WBC 4.0 - 10.5 K/uL 9.8  7.5  9.7   Hemoglobin 13.0 - 17.0 g/dL 69.6  29.5  28.4   Hematocrit 39.0 - 52.0 % 41.0  37.5  34.9   Platelets 150 - 400 K/uL 377  290  371        Latest Ref Rng & Units 10/20/2023   11:37 AM 10/15/2023   11:02 AM 06/26/2023   10:36 AM  CMP  Glucose 70 - 99 mg/dL 90  88  132   BUN 6 - 20 mg/dL 16  18  26    Creatinine 0.61 - 1.24 mg/dL 4.40  1.02  7.25   Sodium  135 - 145 mmol/L 136  141  139   Potassium 3.5 - 5.1 mmol/L 4.7  4.8  4.4   Chloride 98 - 111 mmol/L 106  105  108   CO2 22 - 32 mmol/L 24  21  22    Calcium 8.9 - 10.3 mg/dL 9.6  9.3  9.0   Total Protein 6.5 - 8.1 g/dL 7.7  6.9  6.7   Total Bilirubin 0.0 - 1.2 mg/dL 0.3  0.2  0.5   Alkaline Phos 38 - 126 U/L 112  121  82   AST 15 - 41 U/L 14  14  22    ALT 0 - 44 U/L 11  13  18      ASSESSMENT & PLAN SHEAMUS HASTING is a 58 y.o. male returns for follow-up for iron deficiency anemia.  #Iron deficiency anemia 2/2 GI bleeding: --Established care with Dr. Eulah Pont at  [facility]. Underwent EGD/colonoscopy on 10/28/2021 with findings that showed esophagitis with bleeding, large haital hernia with multiple cameron ulcers, gastritis, duodenitis. Colonoscopy was incomplete due to poor prep.  --Patient is unable to tolerate oral iron supplementation due to constipation.   --Most recently received IV monoferric 1000 mg x 1 dose on 01/13/2022 --Labs today showed WBC 9.8, Hgb 12.8, MCV 72.1, Plt 377 --Recommend IV monoferric x 1 dose if iron levels are persistently low.   #Renal Cystic lesions: --Seen on renal US from 11/10/2021 with recommendations to repeat to monitor --Repeat renal US on 04/28/2023. No further imaging recommended per radiology.   #Nausea:  --Mainly triggered with liquids.  --Etiology unknown but will request follow up with GI to further evaluate   Follow up: 12 weeks: labs only 24 weeks: labs and follow up with Dr. Leonides Schanz  No orders of the defined types were placed in this encounter.  All questions were answered. The patient knows to call the clinic with any problems, questions or concerns.  I have spent a total of 30 minutes minutes of face-to-face and non-face-to-face time, preparing to see the patient, performing a medically appropriate examination, counseling and educating the patient, ordering medications/tests, communicating with other health care professionals, documenting clinical information in the electronic health record, and care coordination.    Ulysees Barns, MD Department of Hematology/Oncology [facility] Phone: [phone] Pager: [phone] Email: [email or web address]

## 2023-10-21 ENCOUNTER — Encounter: Payer: Self-pay | Admitting: Hematology and Oncology

## 2023-10-22 ENCOUNTER — Other Ambulatory Visit: Payer: Self-pay | Admitting: Hematology and Oncology

## 2023-10-22 ENCOUNTER — Telehealth: Payer: Self-pay

## 2023-10-22 NOTE — Telephone Encounter (Signed)
Dr. Leonides Schanz, patient will be scheduled as soon as possible.  Auth Submission: NO AUTH NEEDED Site of care: Site of care: [facility] Payer: [facility] Medicaid Amer[employer]health caritas Medication & CPT/J Code(s) submitted: Feraheme (ferumoxytol) [id number] Route of submission (phone, fax, portal):  Phone # Fax # Auth type: Buy/Bill PB Units/visits requested: 510mg  x 2 doses Reference number:  Approval from: 10/22/23 to 04/20/24

## 2023-10-25 ENCOUNTER — Other Ambulatory Visit (HOSPITAL_COMMUNITY): Payer: Self-pay

## 2023-10-26 ENCOUNTER — Telehealth: Payer: Self-pay | Admitting: Hematology and Oncology

## 2023-10-26 NOTE — Telephone Encounter (Signed)
Scheduled appointments per 2/18 scheduling message. Left the patient a voicemail with the appointment details.

## 2023-10-27 ENCOUNTER — Other Ambulatory Visit (HOSPITAL_COMMUNITY): Payer: Self-pay

## 2023-11-02 ENCOUNTER — Other Ambulatory Visit: Payer: Self-pay | Admitting: Medical Genetics

## 2023-11-02 ENCOUNTER — Other Ambulatory Visit: Payer: Self-pay

## 2023-11-02 NOTE — Patient Instructions (Signed)
 Visit Information  Mr. Jinkins was given information about Medicaid Managed Care team care coordination services as a part of their [employer] Medicaid benefit. Belia Heman Scarpulla verbally consentedto engagement with the [facility] Managed Care team.   If you are experiencing a medical emergency, please call 911 or report to your local emergency department or urgent care.   If you have a non-emergency medical problem during routine business hours, please contact your provider's office and ask to speak with a nurse.   For questions related to your [employer]health [facility] health plan, please call: [phone]  OR visit the member homepage at: [email or web address]  If you would like to schedule transportation through your [facility] plan, please call the following number at least 2 days in advance of your appointment: [phone]  If you are experiencing a behavioral health crisis, call the [facility]Health [facility] Crisis Line at [phone]). The line is available 24 hours a day, seven days a week.  If you would like help to quit smoking, call 1-800-QUIT-NOW (910-079-2603) OR Espaol: 1-855-Djelo-Ya (3-235-573-2202) o para ms informacin haga clic aqu or Text READY to 542-706 to register via text   Mr. Pavlicek - following are the goals we discussed in your visit today:   Goals Addressed   None      Social Worker will follow up in 30 days.   [name], [name], MHA [facility]  Managed Medicaid Social Worker [phone]   Following is a copy of your plan of care:  There are no care plans that you recently modified to display for this patient.

## 2023-11-02 NOTE — Patient Outreach (Signed)
 Medicaid Managed Care Social Work Note  11/02/2023 Name:  [name] MRN:  [id number] DOB:  [birth year]  [name] is an 58 y.o. year old male who is a primary patient of [name], MD.  The Medicaid Managed Care Coordination team was consulted for assistance with:  Community Resources   [name] was given information about Medicaid Managed Care Coordination team services today. [name] Patient agreed to services and verbal consent obtained.  Engaged with patient  for by telephone forfollow up visit in response to referral for case management and/or care coordination services.   Patient is participating in a Managed Medicaid Plan:  Yes  Assessments/Interventions:  Review of past medical history, allergies, medications, health status, including review of consultants reports, laboratory and other test data, was performed as part of comprehensive evaluation and provision of chronic care management services.  SDOH: (Social Drivers of Health) assessments and interventions performed: SDOH Interventions    Flowsheet Row Patient Outreach Telephone from 10/04/2023 in [place] [facility] POPULATION HEALTH DEPARTMENT Patient Outreach Telephone from 09/15/2023 in [place] POPULATION HEALTH DEPARTMENT Chronic Care Management from 02/13/2022 in [facility] - A Dept Of [place]. [facility] Office Visit from 12/19/2021 in [facility] - A Dept Of [place]. [facility] Chronic Care Management from 05/06/2021 in [facility] - A Dept Of [place]. [facility] Office Visit from 09/14/2017 in [facility] - A Dept Of [place]. [facility]  SDOH Interventions        Food Insecurity Interventions -- -- Other (Comment)  [pt already connected with FNS,  is not interested in other resources] -- -- --  Housing Interventions -- [employer] Provided Patient Refused  [pt is not interested in a shelter] --  -- --  Transportation Interventions -- -- -- -- Retail banker --  Depression Interventions/Treatment  -- Currently on Treatment -- Referral to Psychiatry -- Currently on Treatment  Financial Strain Interventions -- -- -- -- [name] Referral --  Stress Interventions Community Resources Provided, Provide Counseling [employer] Provided -- -- -- --     BSW completed a telephone outreach with patient, he states he has contacted vocational rehab and left a message, they have not contacted him back yet. Patient states he is still living with a friend and things are going well, he does not have to be out of the home. No other resources are needed at this time.  Advanced Directives Status:  Not addressed in this encounter.  Care Plan                 No Known Allergies  Medications Reviewed Today   Medications were not reviewed in this encounter     Patient Active Problem List   Diagnosis Date Noted   Skin tag 08/03/2023   History of ADHD 08/03/2023   BMI 40.0-44.9, adult (HCC) 07/13/2023   GAD (generalized anxiety disorder) 03/31/2023   Paresthesia 03/12/2023   Hyperlipidemia 02/17/2023   Ankle swelling 02/02/2023   Orbital swelling 02/02/2023   Social problem 12/18/2022   Elevated BP without diagnosis of hypertension 02/10/2022   Asthma 12/19/2021   Renal cyst 12/17/2021   Homelessness 03/14/2021   Amnesia 02/09/2020   Hypogonadism in male 09/14/2017   OSA (obstructive sleep apnea) 05/29/2014   Iron deficiency anemia 12/22/2013   Chronic fatigue 07/07/2013   Adult ADHD 07/07/2013   Depression  01/07/2012   GERD (gastroesophageal reflux disease) 01/07/2012    Conditions to be addressed/monitored per PCP order:   housing  There are no care plans that you recently modified to display for this patient.   Follow up:  Patient agrees to Care Plan and Follow-up.  Plan: The Managed Medicaid care management team will reach out to the patient again over the  next 30 days.  Date/time of next scheduled Social Work care management/care coordination outreach:  11/30/23  [name], [name], [facility] Health  Managed [facility] Social Worker [phone]

## 2023-11-05 ENCOUNTER — Other Ambulatory Visit (HOSPITAL_COMMUNITY): Payer: Self-pay

## 2023-11-05 ENCOUNTER — Ambulatory Visit (INDEPENDENT_AMBULATORY_CARE_PROVIDER_SITE_OTHER): Payer: Medicaid Other

## 2023-11-05 VITALS — BP 138/94 | HR 91 | Temp 98.0°F | Resp 20 | Ht 71.0 in | Wt 310.6 lb

## 2023-11-05 DIAGNOSIS — D508 Other iron deficiency anemias: Secondary | ICD-10-CM

## 2023-11-05 DIAGNOSIS — K922 Gastrointestinal hemorrhage, unspecified: Secondary | ICD-10-CM | POA: Diagnosis not present

## 2023-11-05 DIAGNOSIS — D500 Iron deficiency anemia secondary to blood loss (chronic): Secondary | ICD-10-CM | POA: Diagnosis not present

## 2023-11-05 MED ORDER — FERUMOXYTOL INJECTION 510 MG/17 ML
510.0000 mg | Freq: Once | INTRAVENOUS | Status: AC
  Administered 2023-11-05: 510 mg via INTRAVENOUS
  Filled 2023-11-05: qty 17

## 2023-11-05 MED ORDER — METHYLPHENIDATE HCL 20 MG PO TABS
20.0000 mg | ORAL_TABLET | Freq: Two times a day (BID) | ORAL | 0 refills | Status: DC
  Filled 2023-11-05: qty 60, 30d supply, fill #0

## 2023-11-05 MED ORDER — METHYLPHENIDATE HCL 20 MG PO TABS
20.0000 mg | ORAL_TABLET | Freq: Two times a day (BID) | ORAL | 0 refills | Status: DC
  Filled 2023-11-05 – 2024-02-04 (×3): qty 60, 30d supply, fill #0

## 2023-11-05 NOTE — Progress Notes (Signed)
 Diagnosis: Iron Deficiency Anemia  Provider:  Chilton Greathouse MD  Procedure: IV Infusion  IV Type: Peripheral, IV Location: L Forearm  Feraheme (Ferumoxytol), Dose: 510 mg  Infusion Start Time: 1125  Infusion Stop Time: 1144  Post Infusion IV Care: Patient declined observation and Peripheral IV Discontinued  Discharge: Condition: Good, Destination: Home . AVS Declined  Performed by:  Rico Ala, LPN

## 2023-11-09 ENCOUNTER — Telehealth: Payer: Self-pay | Admitting: Family Medicine

## 2023-11-09 NOTE — Telephone Encounter (Signed)
 Copied from CRM [id number]. Topic: Appointments - Appointment Cancel/Reschedule >> Nov 09, 2023  2:26 PM Geroge Baseman wrote:  Patient calling in, needs to reschedule infusion. Please call back asap. [phone]

## 2023-11-12 ENCOUNTER — Ambulatory Visit: Payer: Medicaid Other

## 2023-11-16 ENCOUNTER — Ambulatory Visit

## 2023-11-16 VITALS — BP 134/84 | HR 85 | Temp 98.1°F | Resp 18 | Ht 71.0 in | Wt 306.0 lb

## 2023-11-16 DIAGNOSIS — D500 Iron deficiency anemia secondary to blood loss (chronic): Secondary | ICD-10-CM

## 2023-11-16 DIAGNOSIS — D508 Other iron deficiency anemias: Secondary | ICD-10-CM

## 2023-11-16 DIAGNOSIS — K922 Gastrointestinal hemorrhage, unspecified: Secondary | ICD-10-CM

## 2023-11-16 MED ORDER — SODIUM CHLORIDE 0.9 % IV SOLN
510.0000 mg | Freq: Once | INTRAVENOUS | Status: AC
  Administered 2023-11-16: 510 mg via INTRAVENOUS
  Filled 2023-11-16: qty 17

## 2023-11-16 NOTE — Progress Notes (Signed)
 Diagnosis: Iron Deficiency Anemia  Provider:  Chilton Greathouse MD  Procedure: IV Infusion  IV Type: Peripheral, IV Location: R Antecubital  Feraheme (Ferumoxytol), Dose: 510 mg  Infusion Start Time: 1443  Infusion Stop Time: 1500  Post Infusion IV Care: Patient declined observation and Peripheral IV Discontinued  Discharge: Condition: Good, Destination: Home . AVS Declined  Performed by:  Adriana Mccallum, RN

## 2023-11-29 ENCOUNTER — Other Ambulatory Visit (HOSPITAL_COMMUNITY): Payer: Self-pay

## 2023-11-29 ENCOUNTER — Other Ambulatory Visit: Payer: Self-pay

## 2023-11-29 ENCOUNTER — Encounter: Payer: Self-pay | Admitting: Family Medicine

## 2023-11-29 ENCOUNTER — Other Ambulatory Visit: Payer: Self-pay | Admitting: Family Medicine

## 2023-11-29 MED ORDER — SEMAGLUTIDE-WEIGHT MANAGEMENT 2.4 MG/0.75ML ~~LOC~~ SOAJ
2.4000 mg | SUBCUTANEOUS | 2 refills | Status: DC
  Filled 2023-11-29: qty 3, 28d supply, fill #0
  Filled 2024-01-03: qty 3, 28d supply, fill #1
  Filled 2024-02-03: qty 3, 28d supply, fill #2

## 2023-11-29 MED ORDER — BUPROPION HCL ER (XL) 300 MG PO TB24
300.0000 mg | ORAL_TABLET | Freq: Every day | ORAL | 1 refills | Status: AC
  Filled 2023-11-29: qty 90, 90d supply, fill #0
  Filled 2024-03-03: qty 90, 90d supply, fill #1

## 2023-11-30 ENCOUNTER — Other Ambulatory Visit: Payer: Self-pay

## 2023-11-30 NOTE — Patient Instructions (Signed)
 Visit Information  Mr. Welter was given information about Medicaid Managed Care team care coordination services as a part of their [employer] Medicaid benefit. Belia Heman Hopman verbally consentedto engagement with the [facility] Managed Care team.   If you are experiencing a medical emergency, please call 911 or report to your local emergency department or urgent care.   If you have a non-emergency medical problem during routine business hours, please contact your provider's office and ask to speak with a nurse.   For questions related to your [facility] [facility] health plan, please call: [phone]  OR visit the member homepage at: [email or web address]  If you would like to schedule transportation through your [facility] plan, please call the following number at least 2 days in advance of your appointment: [phone]  If you are experiencing a behavioral health crisis, call the [facility] Crisis Line at [phone]). The line is available 24 hours a day, seven days a week.  If you would like help to quit smoking, call 1-800-QUIT-NOW (651-624-3499) OR Espaol: 1-855-Djelo-Ya (6-644-034-7425) o para ms informacin haga clic aqu or Text READY to 956-387 to register via text   Mr. Consiglio - following are the goals we discussed in your visit today:   Goals Addressed   None       The  Patient                                              has been provided with contact information for the Managed Medicaid care management team and has been advised to call with any health related questions or concerns.   [name], [name], MHA [facility] Health  Managed Medicaid Social Worker [phone]   Following is a copy of your plan of care:  There are no care plans that you recently modified to display for this patient.

## 2023-11-30 NOTE — Patient Outreach (Signed)
 Medicaid Managed Care Social Work Note  11/30/2023 Name:  [name] MRN:  [id number] DOB:  Jun 03, 1966  [name] is an 58 y.o. year old male who is a primary [name] of [name], MD.  The Medicaid Managed Care Coordination team was consulted for assistance with:   [facility]  [name] was given information about Medicaid Managed Care Coordination team services today. [name] [name] agreed to services and verbal consent obtained.  Engaged with [name]  for by telephone forfollow up visit in response to referral for case management and/or care coordination services.   [name] is participating in a Managed Medicaid Plan:  Yes  Assessments/Interventions:  Review of past medical history, allergies, medications, health status, including review of consultants reports, laboratory and other test data, was performed as part of comprehensive evaluation and provision of chronic care management services.  SDOH: (Social Drivers of Health) assessments and interventions performed: SDOH Interventions    Flowsheet Row [name] Outreach Telephone from 10/04/2023 in [place] [facility] POPULATION HEALTH DEPARTMENT [name] Outreach Telephone from 09/15/2023 in [place] POPULATION HEALTH DEPARTMENT Chronic Care Management from 02/13/2022 in [facility] - A Dept Of [place]. [facility] Office Visit from 12/19/2021 in [facility] - A Dept Of [place]. [facility] Chronic Care Management from 05/06/2021 in [facility] - A Dept Of [place]. [facility] Office Visit from 09/14/2017 in [facility] - A Dept [place]. [facility]  SDOH Interventions        Food Insecurity Interventions -- -- Other (Comment)  [pt already connected with [facility],  is not interested in other resources] -- -- --  Housing Interventions -- [facility] Provided [name] Refused  [pt is not interested in a shelter] -- -- --   Transportation Interventions -- -- -- -- Retail banker --  Depression Interventions/Treatment  -- Currently on Treatment -- Referral to Psychiatry -- Currently on Treatment  Financial Strain Interventions -- -- -- -- [id number] Referral --  Stress Interventions Community Resources Provided, Provide Counseling [facility] Provided -- -- -- --     BSW completed a telephone outreach with [name], he states things are going well. He is still staying with his friend and he does not have to be out. He did contact vocational rehab and they send him an application/packet. He did mail it back and asked BSW to check for him. BSW contacted vocational rehab and spoke with Irving Burton, she stated [name] has not been assigned to a case manager yet. Once he is assigned they will reach out to him to schedule an appointment. [name] reports no resources are needed at this time. [name] does have BSW contact information for any future needs.   Advanced Directives Status:  Not addressed in this encounter.  Care Plan                 No Known Allergies  Medications Reviewed Today   Medications were not reviewed in this encounter     [name] Active Problem List   Diagnosis Date Noted   Skin tag 08/03/2023   History of ADHD 08/03/2023   BMI 40.0-44.9, adult (HCC) 07/13/2023   GAD (generalized anxiety disorder) 03/31/2023   Paresthesia 03/12/2023   Hyperlipidemia 02/17/2023   Ankle swelling 02/02/2023   Orbital swelling 02/02/2023   Social problem 12/18/2022   Elevated BP without diagnosis of hypertension 02/10/2022  Asthma 12/19/2021   Renal cyst 12/17/2021   Homelessness 03/14/2021   Amnesia 02/09/2020   Hypogonadism in male 09/14/2017   OSA (obstructive sleep apnea) 05/29/2014   Iron deficiency anemia 12/22/2013   Chronic fatigue 07/07/2013   Adult ADHD 07/07/2013   Depression 01/07/2012   GERD (gastroesophageal reflux disease) 01/07/2012    Conditions to be  addressed/monitored per PCP order:   vocational rehab  There are no care plans that you recently modified to display for this [name].   Follow up:  [name] agrees to Care Plan and Follow-up.  Plan: The  [name] has been provided with contact information for the Managed Medicaid care management team and has been advised to call with any health related questions or concerns.    Abelino Derrick, MHA [facility] Health  Managed [facility] Social Worker [phone]

## 2023-12-03 ENCOUNTER — Other Ambulatory Visit (HOSPITAL_COMMUNITY): Payer: Self-pay

## 2023-12-03 MED ORDER — METHYLPHENIDATE HCL 20 MG PO TABS
ORAL_TABLET | ORAL | 0 refills | Status: DC
  Filled 2023-12-03: qty 90, 30d supply, fill #0

## 2023-12-14 ENCOUNTER — Encounter: Payer: Self-pay | Admitting: Physician Assistant

## 2023-12-20 ENCOUNTER — Inpatient Hospital Stay: Payer: Medicaid Other

## 2023-12-20 ENCOUNTER — Inpatient Hospital Stay: Payer: Medicaid Other | Admitting: Hematology and Oncology

## 2023-12-20 NOTE — Progress Notes (Unsigned)
 [facility] [phone]   [phone]  PROGRESS NOTE  Patient Care Team: Arn Lane, MD as PCP - General (Family Medicine)  Hematological/Oncological History  #Iron deficiency anemia 2/2 to Presume GI Bleeding 1) 03/14/2021: TIBC 349, Iron 30 (L), Iron saturation 9% (L), Ferritin 15 (L), Vitamin B12 537, Folate 7.4, WBC 8.6, Hgb 10.7 (L), MCV 74 (L), Plt 450.   2) 05/07/2021: Establish care with Irene Thayil PA-C  3) 05/26/2021-06/20/2021: Received IV venofer 200 mg once a week x 5 doses  4) 07/29/2021-08/26/2021: Received IV venofer 200 mg once a week x 5 doses  5) 10/13/2021: Received IV monoferric 1000 mg x 1 dose.   6) 10/28/2021: EGD showed esophagitis with bleeding, large haital hernia with multiple cameron ulcers, gastritis, duodenitis.Colonoscopy was incomplete due to poor prep. Evidence of non-bleeding internal hemorrhoids.   7) 11/18/2021: Received IV monoferric 1000 mg x 1 dose  8) 01/13/2022: Received IV monoferric 1000 mg x 1 dose  HISTORY OF PRESENTING ILLNESS:  [name] 58 y.o. male returns for follow-up for iron deficiency anemia.  His last visit was on 10/20/2023.  In the interim since his last visit he   On exam today, [name] reports ***. He denies fevers, chills, sweats, chest pain or cough. He has no other complaints. Rest of the ROS is below.   MEDICAL HISTORY:  Past Medical History:  Diagnosis Date   ADD (attention deficit disorder)    ADD (attention deficit disorder)    Allergy    Anemia, iron deficiency    Anxiety    Asthma    Blood transfusion    Constipation    Depression    Depression    Exertional dyspnea    secondary to anemia   GERD (gastroesophageal reflux disease)    Hiatal hernia    Narcolepsy    pt also reports episodes of passing out that are unrelated to narcolepsy about a year ago   Sleep apnea 01/2018   has a CPAP, "unable to use it"    SURGICAL HISTORY: Past Surgical History:   Procedure Laterality Date   COLONOSCOPY     UPPER GASTROINTESTINAL ENDOSCOPY      SOCIAL HISTORY: Social History   Socioeconomic History   Marital status: Single    Spouse name: Not on file   Number of children: 0   Years of education: Not on file   Highest education level: Bachelor's degree (e.g., BA, AB, BS)  Occupational History   Occupation: student-Business  Tobacco Use   Smoking status: Never   Smokeless tobacco: Never  Vaping Use   Vaping status: Never Used  Substance and Sexual Activity   Alcohol use: No   Drug use: No   Sexual activity: Not Currently  Other Topics Concern   Not on file  Social History Narrative   Lives alone. Never married, no children.  Has two sister who live in [place].  Student at work study.  [employer].  Working toward Chief Technology Officer.  Previously worked in Geophysicist/field seismologist.  Was also previously homeless due to job loss and psychiatric exacerbation.   Social Drivers of Health   Financial Resource Strain: High Risk (08/23/2023)   Overall Financial Resource Strain (CARDIA)    Difficulty of Paying Living Expenses: Very hard  Food Insecurity: Food Insecurity Present (08/23/2023)   Hunger Vital Sign    Worried About Running Out of Food in the Last Year: Often true    Ran Out of  Food in the Last Year: Often true  Transportation Needs: Unmet Transportation Needs (08/23/2023)   PRAPARE - Transportation    Lack of Transportation (Medical): No    Lack of Transportation (Non-Medical): Yes  Physical Activity: Inactive (08/23/2023)   Exercise Vital Sign    Days of Exercise per Week: 0 days    Minutes of Exercise per Session: 10 min  Stress: Stress Concern Present (10/04/2023)   [employer] of Occupational Health - Occupational Stress Questionnaire    Feeling of Stress : To some extent  Social Connections: Socially Isolated (08/23/2023)   Social Connection and Isolation Panel [NHANES]    Frequency of Communication with  Friends and Family: Once a week    Frequency of Social Gatherings with Friends and Family: Never    Attends Religious Services: Never    Database administrator or Organizations: No    Attends Banker Meetings: Never    Marital Status: Never married  Intimate Partner Violence: Not At Risk (03/31/2023)   Humiliation, Afraid, Rape, and Kick questionnaire    Fear of Current or Ex-Partner: No    Emotionally Abused: No    Physically Abused: No    Sexually Abused: No    FAMILY HISTORY: Family History  Problem Relation Age of Onset   Diabetes Paternal Grandfather    Cancer Neg Hx    Stroke Neg Hx    Breast cancer Neg Hx    Celiac disease Neg Hx    Cirrhosis Neg Hx    Clotting disorder Neg Hx    Colitis Neg Hx    Colon cancer Neg Hx    Colon polyps Neg Hx    Crohn's disease Neg Hx    Cystic fibrosis Neg Hx    Esophageal cancer Neg Hx    Heart disease Neg Hx    Hemochromatosis Neg Hx    Inflammatory bowel disease Neg Hx    Irritable bowel syndrome Neg Hx    Kidney disease Neg Hx    Liver cancer Neg Hx    Liver disease Neg Hx    Ovarian cancer Neg Hx    Pancreatic cancer Neg Hx    Prostate cancer Neg Hx    Rectal cancer Neg Hx    Stomach cancer Neg Hx    Ulcerative colitis Neg Hx    Uterine cancer Neg Hx    Wilson's disease Neg Hx     ALLERGIES:  has no known allergies.  MEDICATIONS:  Current Outpatient Medications  Medication Sig Dispense Refill   albuterol (VENTOLIN HFA) 108 (90 Base) MCG/ACT inhaler Inhale 1-2 puffs into the lungs every 6 (six) hours as needed for wheezing or shortness of breath. (Patient not taking: Reported on 10/15/2023) 18 g 3   buPROPion (WELLBUTRIN XL) 300 MG 24 hr tablet Take 1 tablet (300 mg total) by mouth daily. 90 tablet 1   methylphenidate (RITALIN) 10 MG tablet Take 1 tablet (10 mg total) by mouth 2 (two) times daily, in the morning and in the afternoon. 60 tablet 0   methylphenidate (RITALIN) 20 MG tablet Take 1 tablet (20 mg  total) by mouth 2 (two) times daily in the morning and afternoon. 60 tablet 0   methylphenidate (RITALIN) 20 MG tablet Take 1 tablet (20 mg total) by mouth 2 (two) times daily. 60 tablet 0   methylphenidate (RITALIN) 20 MG tablet Take 1 tablet (20 mg total) by mouth in the morning AND 1-2 tablets (20-40 mg total) daily in the afternoon. 90  tablet 0   pantoprazole (PROTONIX) 40 MG tablet Take 1 tablet (40 mg total) by mouth daily. 90 tablet 1   Semaglutide-Weight Management 2.4 MG/0.75ML SOAJ Inject 2.4 mg into the skin once a week. 3 mL 2   umeclidinium-vilanterol (ANORO ELLIPTA) 62.5-25 MCG/ACT AEPB Inhale 1 puff into the lungs daily. 60 each 4   No current facility-administered medications for this visit.    REVIEW OF SYSTEMS:   Constitutional: ( - ) fevers, ( - )  chills , ( - ) night sweats Eyes: ( - ) blurriness of vision, ( - ) double vision, ( - ) watery eyes Ears, nose, mouth, throat, and face: ( - ) mucositis, ( - ) sore throat Respiratory: ( - ) cough, ( + ) dyspnea, ( - ) wheezes Cardiovascular: ( - ) palpitation, ( - ) chest discomfort, ( - ) lower extremity swelling Gastrointestinal:  ( +) nausea, ( - ) heartburn, ( - ) change in bowel habits Skin: ( - ) abnormal skin rashes Lymphatics: ( - ) new lymphadenopathy, ( - ) easy bruising Neurological: ( - ) numbness, ( - ) tingling, ( - ) new weaknesses Behavioral/Psych: ( - ) mood change, ( - ) new changes  All other systems were reviewed with the patient and are negative.  PHYSICAL EXAMINATION: ECOG PERFORMANCE STATUS: 2 - Symptomatic, <50% confined to bed  There were no vitals filed for this visit.    There were no vitals filed for this visit.  GENERAL: well appearing male in NAD, obese SKIN: skin color, texture, turgor are normal, no rashes or significant lesions EYES: conjunctiva are pink and non-injected, sclera clear LUNGS: clear to auscultation and percussion with normal breathing effort HEART: Regular rhythm and  rate. No murmurs and no lower extremity edema. Musculoskeletal: no cyanosis of digits and no clubbing  PSYCH: alert & oriented x 3, fluent speech NEURO: no focal motor/sensory deficits  LABORATORY DATA:  I have reviewed the data as listed    Latest Ref Rng & Units 10/20/2023   11:37 AM 06/26/2023   10:36 AM 06/23/2023    1:42 PM  CBC  WBC 4.0 - 10.5 K/uL 9.8  7.5  9.7   Hemoglobin 13.0 - 17.0 g/dL 45.4  09.8  11.9   Hematocrit 39.0 - 52.0 % 41.0  37.5  34.9   Platelets 150 - 400 K/uL 377  290  371        Latest Ref Rng & Units 10/20/2023   11:37 AM 10/15/2023   11:02 AM 06/26/2023   10:36 AM  CMP  Glucose 70 - 99 mg/dL 90  88  147   BUN 6 - 20 mg/dL 16  18  26    Creatinine 0.61 - 1.24 mg/dL 8.29  5.62  1.30   Sodium 135 - 145 mmol/L 136  141  139   Potassium 3.5 - 5.1 mmol/L 4.7  4.8  4.4   Chloride 98 - 111 mmol/L 106  105  108   CO2 22 - 32 mmol/L 24  21  22    Calcium 8.9 - 10.3 mg/dL 9.6  9.3  9.0   Total Protein 6.5 - 8.1 g/dL 7.7  6.9  6.7   Total Bilirubin 0.0 - 1.2 mg/dL 0.3  0.2  0.5   Alkaline Phos 38 - 126 U/L 112  121  82   AST 15 - 41 U/L 14  14  22    ALT 0 - 44 U/L 11  13  18  ASSESSMENT & PLAN [name] is a 58 y.o. male returns for follow-up for iron deficiency anemia.  #Iron deficiency anemia 2/2 GI bleeding: --Established care with Dr. Regino Caprio at [facility]. Underwent EGD/colonoscopy on 10/28/2021 with findings that showed esophagitis with bleeding, large haital hernia with multiple cameron ulcers, gastritis, duodenitis. Colonoscopy was incomplete due to poor prep.  --Patient is unable to tolerate oral iron supplementation due to constipation.   --Most recently received IV monoferric 1000 mg x 1 dose on 01/13/2022 --Labs today showed WBC *** --Recommend IV monoferric x 1 dose if iron levels are persistently low.   #Renal Cystic lesions: --Seen on renal US  from 11/10/2021 with recommendations to repeat to monitor --Repeat renal US  on 04/28/2023. No  further imaging recommended per radiology.   #Nausea:  --Mainly triggered with liquids.  --Etiology unknown but will request follow up with GI to further evaluate   Follow up: 12 weeks: labs only 24 weeks: labs and follow up with Dr. Rosaline Coma  No orders of the defined types were placed in this encounter.  All questions were answered. The patient knows to call the clinic with any problems, questions or concerns.  I have spent a total of 30 minutes minutes of face-to-face and non-face-to-face time, preparing to see the patient, performing a medically appropriate examination, counseling and educating the patient, ordering medications/tests, communicating with other health care professionals, documenting clinical information in the electronic health record, and care coordination.    Rogerio Clay, MD Department of Hematology/Oncology [facility] Phone: [phone] Pager: [phone] Email: [email or web address]

## 2023-12-21 ENCOUNTER — Encounter: Payer: Self-pay | Admitting: Physician Assistant

## 2024-01-03 ENCOUNTER — Encounter: Payer: Self-pay | Admitting: Family Medicine

## 2024-01-06 ENCOUNTER — Other Ambulatory Visit (HOSPITAL_COMMUNITY): Payer: Self-pay

## 2024-01-06 MED ORDER — METHYLPHENIDATE HCL 20 MG PO TABS
ORAL_TABLET | ORAL | 0 refills | Status: AC
  Filled 2024-01-06: qty 90, 30d supply, fill #0

## 2024-01-06 MED ORDER — METHYLPHENIDATE HCL 20 MG PO TABS
20.0000 mg | ORAL_TABLET | Freq: Three times a day (TID) | ORAL | 0 refills | Status: DC
  Filled 2024-02-24 (×2): qty 90, 30d supply, fill #0

## 2024-01-17 ENCOUNTER — Emergency Department (HOSPITAL_BASED_OUTPATIENT_CLINIC_OR_DEPARTMENT_OTHER)
Admission: EM | Admit: 2024-01-17 | Discharge: 2024-01-17 | Disposition: A | Discharge status: Home / Self Care | Attending: Emergency Medicine | Admitting: Emergency Medicine

## 2024-01-17 ENCOUNTER — Other Ambulatory Visit (HOSPITAL_COMMUNITY): Payer: Self-pay

## 2024-01-17 ENCOUNTER — Other Ambulatory Visit: Payer: Self-pay

## 2024-01-17 ENCOUNTER — Other Ambulatory Visit (HOSPITAL_BASED_OUTPATIENT_CLINIC_OR_DEPARTMENT_OTHER): Payer: Self-pay

## 2024-01-17 ENCOUNTER — Emergency Department (HOSPITAL_BASED_OUTPATIENT_CLINIC_OR_DEPARTMENT_OTHER)

## 2024-01-17 ENCOUNTER — Emergency Department (HOSPITAL_BASED_OUTPATIENT_CLINIC_OR_DEPARTMENT_OTHER): Admitting: Radiology

## 2024-01-17 DIAGNOSIS — R82998 Other abnormal findings in urine: Secondary | ICD-10-CM | POA: Diagnosis present

## 2024-01-17 DIAGNOSIS — N179 Acute kidney failure, unspecified: Secondary | ICD-10-CM | POA: Insufficient documentation

## 2024-01-17 DIAGNOSIS — N3001 Acute cystitis with hematuria: Secondary | ICD-10-CM | POA: Insufficient documentation

## 2024-01-17 DIAGNOSIS — R0602 Shortness of breath: Secondary | ICD-10-CM | POA: Diagnosis not present

## 2024-01-17 DIAGNOSIS — J45909 Unspecified asthma, uncomplicated: Secondary | ICD-10-CM | POA: Diagnosis not present

## 2024-01-17 DIAGNOSIS — R531 Weakness: Secondary | ICD-10-CM | POA: Insufficient documentation

## 2024-01-17 DIAGNOSIS — Z7951 Long term (current) use of inhaled steroids: Secondary | ICD-10-CM | POA: Insufficient documentation

## 2024-01-17 LAB — CBC WITH DIFFERENTIAL/PLATELET
Abs Immature Granulocytes: 0.06 10*3/uL (ref 0.00–0.07)
Basophils Absolute: 0.1 10*3/uL (ref 0.0–0.1)
Basophils Relative: 1 %
Eosinophils Absolute: 0.8 10*3/uL — ABNORMAL HIGH (ref 0.0–0.5)
Eosinophils Relative: 7 %
HCT: 42.6 % (ref 39.0–52.0)
Hemoglobin: 13.9 g/dL (ref 13.0–17.0)
Immature Granulocytes: 1 %
Lymphocytes Relative: 8 %
Lymphs Abs: 0.8 10*3/uL (ref 0.7–4.0)
MCH: 25.9 pg — ABNORMAL LOW (ref 26.0–34.0)
MCHC: 32.6 g/dL (ref 30.0–36.0)
MCV: 79.3 fL — ABNORMAL LOW (ref 80.0–100.0)
Monocytes Absolute: 0.7 10*3/uL (ref 0.1–1.0)
Monocytes Relative: 7 %
Neutro Abs: 7.9 10*3/uL — ABNORMAL HIGH (ref 1.7–7.7)
Neutrophils Relative %: 76 %
Platelets: 346 10*3/uL (ref 150–400)
RBC: 5.37 MIL/uL (ref 4.22–5.81)
RDW: 16.4 % — ABNORMAL HIGH (ref 11.5–15.5)
WBC: 10.4 10*3/uL (ref 4.0–10.5)
nRBC: 0 % (ref 0.0–0.2)

## 2024-01-17 LAB — COMPREHENSIVE METABOLIC PANEL WITH GFR
ALT: 19 U/L (ref 0–44)
AST: 22 U/L (ref 15–41)
Albumin: 4 g/dL (ref 3.5–5.0)
Alkaline Phosphatase: 99 U/L (ref 38–126)
Anion gap: 14 (ref 5–15)
BUN: 25 mg/dL — ABNORMAL HIGH (ref 6–20)
CO2: 21 mmol/L — ABNORMAL LOW (ref 22–32)
Calcium: 9.8 mg/dL (ref 8.9–10.3)
Chloride: 105 mmol/L (ref 98–111)
Creatinine, Ser: 1.64 mg/dL — ABNORMAL HIGH (ref 0.61–1.24)
GFR, Estimated: 48 mL/min — ABNORMAL LOW (ref >60)
Glucose, Bld: 96 mg/dL (ref 70–99)
Potassium: 4.3 mmol/L (ref 3.5–5.1)
Sodium: 139 mmol/L (ref 135–145)
Total Bilirubin: 0.2 mg/dL (ref 0.0–1.2)
Total Protein: 7.3 g/dL (ref 6.5–8.1)

## 2024-01-17 LAB — D-DIMER, QUANTITATIVE: D-Dimer, Quant: 0.61 ug{FEU}/mL — ABNORMAL HIGH (ref 0.00–0.50)

## 2024-01-17 LAB — URINALYSIS, ROUTINE W REFLEX MICROSCOPIC
Bilirubin Urine: NEGATIVE
Glucose, UA: NEGATIVE mg/dL
Ketones, ur: NEGATIVE mg/dL
Nitrite: NEGATIVE
Protein, ur: 30 mg/dL — AB
RBC / HPF: >50 RBC/hpf (ref 0–5)
Specific Gravity, Urine: 1.024 (ref 1.005–1.030)
pH: 5 (ref 5.0–8.0)

## 2024-01-17 LAB — LIPASE, BLOOD: Lipase: 21 U/L (ref 11–51)

## 2024-01-17 LAB — PRO BRAIN NATRIURETIC PEPTIDE: Pro Brain Natriuretic Peptide: <36 pg/mL (ref <300.0)

## 2024-01-17 MED ORDER — SODIUM CHLORIDE 0.9 % IV BOLUS
1000.0000 mL | Freq: Once | INTRAVENOUS | Status: AC
Start: 2024-01-17 — End: 2024-01-17
  Administered 2024-01-17: 1000 mL via INTRAVENOUS

## 2024-01-17 MED ORDER — CEFPODOXIME PROXETIL 200 MG PO TABS
200.0000 mg | ORAL_TABLET | Freq: Two times a day (BID) | ORAL | 0 refills | Status: AC
Start: 2024-01-17 — End: 2024-01-27
  Filled 2024-01-17 – 2024-03-03 (×3): qty 20, 10d supply, fill #0

## 2024-01-17 MED ORDER — IOHEXOL 350 MG/ML SOLN
100.0000 mL | Freq: Once | INTRAVENOUS | Status: AC | PRN
  Administered 2024-01-17: 100 mL via INTRAVENOUS

## 2024-01-17 NOTE — ED Notes (Signed)
 Discharge paperwork given and verbally understood.

## 2024-01-17 NOTE — Discharge Instructions (Signed)
 Call your pulmonologist for increasing shortness of breath.  Your workup today is overall reassuring but you do also need to follow-up with PCP to have kidney function rechecked in 2 to 3 days.  I have sent antibiotic for urinary tract infection. Return for new or worsening symptoms.

## 2024-01-17 NOTE — ED Triage Notes (Signed)
 States "dark urine" over the last 2 weeks. Denies any recent increase in exercise.   Reports intermittent SHOB and fatigue.

## 2024-01-17 NOTE — ED Provider Notes (Signed)
 [place] EMERGENCY DEPARTMENT AT [facility] Provider Note   CSN: [id number] Arrival date & time: 01/17/24  1112     History  Chief Complaint  Patient presents with   Hematuria    [name] is a 58 y.o. male.  Past medical history of asthma, anxiety, depression, iron  deficiency anemia, homelessness is presenting to emergency room with complaint of dark urine (light brown in color) for the past 2 weeks, decreased urine output, generalized weakness. Reports urine color has been fine today. Notes some intermittent suprapubic pain but denies any dysuria or burning when peeing.  Denies any lower urinary tract symptoms. Has been eating and drinking but less hungry now that he is on Wegovy .   He also reports that he feels quite short of breath, 1 week, only with exertion, and feels like he can do very minimal activity without needing to sit down and rest.  Reports he is staying with a family member/friend. Reports that he has been very immobile and lays in bed all day. Denies SI/HI, down/depressed.  Denies any fever, chills.  Notes using inhaler without improvement in SOB. Denies CP, cough.   Hematuria       Home Medications Prior to Admission medications   Medication Sig Start Date End Date Taking? Authorizing Provider  albuterol  (VENTOLIN  HFA) 108 (90 Base) MCG/ACT inhaler Inhale 1-2 puffs into the lungs every 6 (six) hours as needed for wheezing or shortness of breath. Patient not taking: Reported on 10/15/2023 06/29/23   Lavada Porteous, DO  buPROPion  (WELLBUTRIN  XL) 300 MG 24 hr tablet Take 1 tablet (300 mg total) by mouth daily. 11/29/23   Arn Lane, MD  methylphenidate  (RITALIN ) 10 MG tablet Take 1 tablet (10 mg total) by mouth 2 (two) times daily, in the morning and in the afternoon. 10/05/23     methylphenidate  (RITALIN ) 20 MG tablet Take 1 tablet (20 mg total) by mouth 2 (two) times daily in the morning and afternoon. 11/05/23     methylphenidate  (RITALIN ) 20  MG tablet Take 1 tablet (20 mg total) by mouth 2 (two) times daily. 11/05/23     methylphenidate  (RITALIN ) 20 MG tablet Take 1 tablet (20 mg total) by mouth in the morning AND 1-2 tablets (20-40 mg total) daily in the afternoon. 12/03/23     methylphenidate  (RITALIN ) 20 MG tablet Take one tablet by mouth in the morning, one tablet in the afternoon, and one tablet in the evening. (02/06/24) 02/06/24     methylphenidate  (RITALIN ) 20 MG tablet Take 1 tablet (20 mg total) by mouth in the morning, AND 1-2 tablets (20-40 mg total) daily in the afternoon. 01/06/24 03/06/24    pantoprazole  (PROTONIX ) 40 MG tablet Take 1 tablet (40 mg total) by mouth daily. 08/23/23   Arn Lane, MD  Semaglutide -Weight Management 2.4 MG/0.75ML SOAJ Inject 2.4 mg into the skin once a week. 11/29/23   Arn Lane, MD  umeclidinium-vilanterol (ANORO ELLIPTA ) 62.5-25 MCG/ACT AEPB Inhale 1 puff into the lungs daily. 04/07/23   Antonio Baumgarten, NP      Allergies    Patient has no known allergies.    Review of Systems   Review of Systems  Genitourinary:  Positive for hematuria.    Physical Exam Updated Vital Signs BP 120/85 (BP Location: Right Arm)   Pulse 95   Temp 98.1 F (36.7 C)   Resp 18   SpO2 96%  Physical Exam Vitals and nursing note reviewed.  Constitutional:  General: He is not in acute distress.    Appearance: He is not toxic-appearing.  HENT:     Head: Normocephalic and atraumatic.  Eyes:     General: No scleral icterus.    Conjunctiva/sclera: Conjunctivae normal.  Cardiovascular:     Rate and Rhythm: Normal rate and regular rhythm.     Pulses: Normal pulses.     Heart sounds: Normal heart sounds.  Pulmonary:     Effort: Pulmonary effort is normal. No respiratory distress.     Breath sounds: Normal breath sounds.  Abdominal:     General: Abdomen is flat. Bowel sounds are normal.     Palpations: Abdomen is soft.     Tenderness: There is no abdominal tenderness.  Musculoskeletal:      Right lower leg: No edema.     Left lower leg: No edema.  Skin:    General: Skin is warm and dry.     Findings: No lesion.  Neurological:     General: No focal deficit present.     Mental Status: He is alert and oriented to person, place, and time. Mental status is at baseline.     ED Results / Procedures / Treatments   Labs (all labs ordered are listed, but only abnormal results are displayed) Labs Reviewed  URINALYSIS, ROUTINE W REFLEX MICROSCOPIC - Abnormal; Notable for the following components:      Result Value   APPearance HAZY (*)    Hgb urine dipstick MODERATE (*)    Protein, ur 30 (*)    Leukocytes,Ua SMALL (*)    Bacteria, UA RARE (*)    All other components within normal limits  COMPREHENSIVE METABOLIC PANEL WITH GFR - Abnormal; Notable for the following components:   CO2 21 (*)    BUN 25 (*)    Creatinine, Ser 1.64 (*)    GFR, Estimated 48 (*)    All other components within normal limits  CBC WITH DIFFERENTIAL/PLATELET - Abnormal; Notable for the following components:   MCV 79.3 (*)    MCH 25.9 (*)    RDW 16.4 (*)    Neutro Abs 7.9 (*)    Eosinophils Absolute 0.8 (*)    All other components within normal limits  D-DIMER, QUANTITATIVE - Abnormal; Notable for the following components:   D-Dimer, Quant 0.61 (*)    All other components within normal limits  LIPASE, BLOOD  PRO BRAIN NATRIURETIC PEPTIDE    EKG EKG Interpretation Date/Time:  Monday Jan 17 2024 11:34:13 EDT Ventricular Rate:  112 PR Interval:  134 QRS Duration:  90 QT Interval:  326 QTC Calculation: 444 R Axis:   96  Text Interpretation: Sinus tachycardia Rightward axis Borderline ECG When compared with ECG of 13-Oct-2023 14:32, No significant change was found Confirmed by Jerald Molly [id number] on 01/17/2024 3:17:30 PM  Radiology CT Angio Chest PE W and/or Wo Contrast Result Date: 01/17/2024 CLINICAL DATA:  Pulmonary embolism EXAM: CT ANGIOGRAPHY CHEST WITH CONTRAST TECHNIQUE:  Multidetector CT imaging of the chest was performed using the standard protocol during bolus administration of intravenous contrast. Multiplanar CT image reconstructions and MIPs were obtained to evaluate the vascular anatomy. RADIATION DOSE REDUCTION: This exam was performed according to the departmental dose-optimization program which includes automated exposure control, adjustment of the mA and/or kV according to patient size and/or use of iterative reconstruction technique. CONTRAST:  OMNIPAQUE  IOHEXOL  350 MG/ML SOLN COMPARISON:  Jan 29, 2022 FINDINGS: Cardiovascular: Satisfactory opacification of the pulmonary arteries to the segmental level.  No evidence of pulmonary embolism. Normal heart size. No pericardial effusion. Mediastinum/Nodes: No enlarged mediastinal, hilar, or axillary lymph nodes. Thyroid gland, trachea, and esophagus demonstrate no significant findings. Lungs/Pleura: Lungs are clear. No pleural effusion or pneumothorax. Chronic interstitial lung disease with mosaic ground-glass appearance of the lungs Upper Abdomen: Large hiatal hernia unchanged since prior examination Musculoskeletal: No chest wall abnormality. No acute or significant osseous findings. Review of the MIP images confirms the above findings. IMPRESSION: *No evidence of pulmonary embolism. *Chronic interstitial lung disease with mosaic ground-glass appearance of the lungs. *Large hiatal hernia unchanged since prior examination. Electronically Signed   By: Fredrich Jefferson M.D.   On: 01/17/2024 15:24   DG Chest 2 View Result Date: 01/17/2024 CLINICAL DATA:  Shortness of breath. EXAM: CHEST - 2 VIEW COMPARISON:  01/29/2022. FINDINGS: Stable cardiomegaly. Moderate-sized hiatal hernia is again noted. Mild linear atelectasis/scarring in the lingula. No focal consolidation, pleural effusion, or pneumothorax. Remote bilateral rib fractures. No acute osseous abnormality. IMPRESSION: 1. Mild linear atelectasis/scarring in the lingula.  Otherwise, no acute cardiopulmonary findings. 2. Moderate-sized hiatal hernia again noted. Electronically Signed   By: Mannie Seek M.D.   On: 01/17/2024 13:06    Procedures Procedures    Medications Ordered in ED Medications  sodium chloride  0.9 % bolus 1,000 mL (1,000 mLs Intravenous New Bag/Given 01/17/24 1443)  iohexol  (OMNIPAQUE ) 350 MG/ML injection 100 mL (100 mLs Intravenous Contrast Given 01/17/24 1433)    ED Course/ Medical Decision Making/ A&P Clinical Course as of 01/17/24 1626  Mon Jan 17, 2024  1554 D-Dimer, Quant(!): 0.61 PE study ordered.  [JB]    Clinical Course User Index [JB] Heath Badon, Kandace Organ, PA-C                                 Medical Decision Making Amount and/or Complexity of Data Reviewed Labs: ordered. Decision-making details documented in ED Course. Radiology: ordered.  Risk Prescription drug management.   This patient presents to the ED for concern of weakness, this involves an extensive number of treatment options, and is a complaint that carries with it a high risk of complications and morbidity.  The differential diagnosis includes Lari Pleva, Rui, CVA, PE, ACS, dissection, electrolye abnormality   Co morbidities that complicate the patient evaluation  Asthma, anxiety, depression, iron  deficiency anemia   Lab Tests:  I personally interpreted labs.  The pertinent results include:   CBC without leukocytosis, hemoglobin is 13.9 CMP is remarkable for elevation in creatinine 1.6, mildly increased from prior recent labs - tolerating oral intake and denies LUTS sx Dimer 0.61, so PE study ordered BNP 36 Lipase 21 UA positive for bacteria and leukocytes    Imaging Studies ordered:  I ordered imaging studies including chest x-ray, CTA PE  I independently visualized and interpreted imaging which showed chest x-ray with lingular scaring. CTA  no PE, chronic interstitial lung disease with ground glass appearance.  I agree with the radiologist  interpretation   Cardiac Monitoring: / EKG:  The patient was maintained on a cardiac monitor.  I personally viewed and interpreted the cardiac monitored which showed an underlying rhythm of: sinus tachycardia    Problem List / ED Course / Critical interventions / Medication management  Patient reporting with complaint of hematuria and SOB that she had with generalized weakness.  On arrival he is slightly tachycardic otherwise normal vital signs.  He is well-appearing.  He has no slurred  speech he is alert and oriented he has nonfocal neurological exam.  Lungs are clear to auscultation bilaterally with no wheezing.  He denies any associated chest pain.  He does feel short of breath with exertion but has no signs of fluid overload on exam.  He has no focal area of abdominal tenderness thus I do not feel imaging is needed for abdomen at this time.  Does report suprapubic pain.  Will check D-dimer for shortness of breath, basic labs chest x-ray EKG and urine. UA is positive for UTI. No sign of systemic illness, not meeting SIRS criteria. Tolerating oral intake. Feel he is appropriate for trial of OP antibiotics. We will discuss recheck kidney function in 2-3 days as creatinine has increased since labs several months ago. He is tolerating oral intake and reports good PCP follow up. Was given 1L NS here for this. Chest x-ray with other acute finding, no evidence of PE, dose have chronic interstitial disease. He is establish with pulm, he will follow up for worsening SOB and these findings. He is in no acute distress, no increase RR, not hypoxia. Lung CTAB. Feel from this perspective appropriate for discharge and OP f/u. I ordered medication including NS for AKi Reevaluation of the patient after these medicines showed that the patient stayed the same I have reviewed the patients home medicines and have made adjustments as needed   Plan  F/u w/ PCP in 2-3d to ensure resolution of sx.  Patient was given  return precautions. Patient stable for discharge at this time.  Patient educated on sx/dx and verbalized understanding of plan. Return to ER w/ new or worsening sx.          Final Clinical Impression(s) / ED Diagnoses Final diagnoses:  AKI (acute kidney injury) (HCC)  Acute cystitis with hematuria    Rx / DC Orders ED Discharge Orders          Ordered    cefpodoxime (VANTIN) 200 MG tablet  2 times daily        01/17/24 1536              Delan Ksiazek, Kandace Organ, PA-C 01/17/24 1627    Scarlette Currier, MD 01/18/24 0025

## 2024-01-18 ENCOUNTER — Other Ambulatory Visit (HOSPITAL_BASED_OUTPATIENT_CLINIC_OR_DEPARTMENT_OTHER): Payer: Self-pay

## 2024-01-18 ENCOUNTER — Encounter (HOSPITAL_BASED_OUTPATIENT_CLINIC_OR_DEPARTMENT_OTHER): Payer: Self-pay

## 2024-01-18 ENCOUNTER — Encounter: Payer: Self-pay | Admitting: Physician Assistant

## 2024-01-18 MED ORDER — CEFDINIR 300 MG PO CAPS
300.0000 mg | ORAL_CAPSULE | Freq: Two times a day (BID) | ORAL | 0 refills | Status: AC
  Filled 2024-01-18: qty 20, 10d supply, fill #0

## 2024-02-03 ENCOUNTER — Other Ambulatory Visit (HOSPITAL_COMMUNITY): Payer: Self-pay

## 2024-02-03 ENCOUNTER — Other Ambulatory Visit: Payer: Self-pay | Admitting: Student

## 2024-02-03 MED ORDER — ALBUTEROL SULFATE HFA 108 (90 BASE) MCG/ACT IN AERS
1.0000 | INHALATION_SPRAY | Freq: Four times a day (QID) | RESPIRATORY_TRACT | 2 refills | Status: AC | PRN
  Filled 2024-02-03: qty 18, 25d supply, fill #0
  Filled 2024-03-27: qty 18, 25d supply, fill #1
  Filled 2024-04-24: qty 18, 25d supply, fill #2

## 2024-02-04 ENCOUNTER — Telehealth: Payer: Self-pay | Admitting: Family Medicine

## 2024-02-04 ENCOUNTER — Other Ambulatory Visit (HOSPITAL_COMMUNITY): Payer: Self-pay

## 2024-02-04 ENCOUNTER — Other Ambulatory Visit: Payer: Self-pay | Admitting: Family Medicine

## 2024-02-04 MED ORDER — SEMAGLUTIDE-WEIGHT MANAGEMENT 2.4 MG/0.75ML ~~LOC~~ SOAJ
2.4000 mg | SUBCUTANEOUS | 3 refills | Status: DC
  Filled 2024-02-04 – 2024-03-03 (×4): qty 3, 28d supply, fill #0

## 2024-02-04 NOTE — Telephone Encounter (Signed)
 Patient came in asking for a refill on his Wegovy ,

## 2024-02-04 NOTE — Telephone Encounter (Signed)
Med escribed 

## 2024-02-07 ENCOUNTER — Telehealth: Payer: Self-pay

## 2024-02-07 NOTE — Telephone Encounter (Signed)
 Pharmacy Patient Advocate Encounter   Received notification from CoverMyMeds that prior authorization for WEGOVY  2.4MG  is required/requested.   Insurance verification completed.   The patient is insured through [facility] .   PA required; PA submitted to above mentioned insurance via CoverMyMeds Key/confirmation #/EOC [id number]. Status is pending

## 2024-02-08 NOTE — Telephone Encounter (Signed)
 Pharmacy Patient Advocate Encounter  Received notification from [facility] that Prior Authorization for WEGOVY  2.4MG  has been DENIED.  Full denial letter will be uploaded to the media tab. See denial reason below.    Baseline weight 07/13/23: 298LBS Current weight 10/15/23: 307LBS  If patient has lost more weight, he will need a current weight check/appt.

## 2024-02-12 ENCOUNTER — Other Ambulatory Visit (HOSPITAL_BASED_OUTPATIENT_CLINIC_OR_DEPARTMENT_OTHER): Payer: Self-pay

## 2024-02-12 ENCOUNTER — Other Ambulatory Visit: Payer: Self-pay | Admitting: Family Medicine

## 2024-02-12 DIAGNOSIS — K259 Gastric ulcer, unspecified as acute or chronic, without hemorrhage or perforation: Secondary | ICD-10-CM

## 2024-02-14 ENCOUNTER — Encounter: Payer: Self-pay | Admitting: Physician Assistant

## 2024-02-14 ENCOUNTER — Other Ambulatory Visit (HOSPITAL_COMMUNITY): Payer: Self-pay

## 2024-02-14 MED ORDER — PANTOPRAZOLE SODIUM 40 MG PO TBEC
40.0000 mg | DELAYED_RELEASE_TABLET | Freq: Every day | ORAL | 1 refills | Status: AC
  Filled 2024-02-14 – 2024-03-27 (×5): qty 90, 90d supply, fill #0
  Filled 2024-06-19: qty 90, 90d supply, fill #1

## 2024-02-24 ENCOUNTER — Other Ambulatory Visit (HOSPITAL_COMMUNITY): Payer: Self-pay

## 2024-02-24 ENCOUNTER — Encounter: Payer: Self-pay | Admitting: Physician Assistant

## 2024-02-25 ENCOUNTER — Other Ambulatory Visit (HOSPITAL_COMMUNITY): Payer: Self-pay

## 2024-03-02 ENCOUNTER — Other Ambulatory Visit: Payer: Self-pay

## 2024-03-02 ENCOUNTER — Other Ambulatory Visit: Payer: Self-pay | Admitting: Family Medicine

## 2024-03-02 ENCOUNTER — Other Ambulatory Visit (HOSPITAL_COMMUNITY): Payer: Self-pay

## 2024-03-02 MED ORDER — UMECLIDINIUM-VILANTEROL 62.5-25 MCG/ACT IN AEPB
1.0000 | INHALATION_SPRAY | Freq: Every day | RESPIRATORY_TRACT | 4 refills | Status: AC
Start: 2024-03-02 — End: ?
  Filled 2024-03-02 – 2024-03-27 (×2): qty 60, 30d supply, fill #0
  Filled 2024-04-24: qty 60, 30d supply, fill #1
  Filled 2024-08-04: qty 60, 30d supply, fill #0
  Filled 2024-08-04: qty 60, 30d supply, fill #2

## 2024-03-03 ENCOUNTER — Other Ambulatory Visit: Payer: Self-pay

## 2024-03-03 ENCOUNTER — Other Ambulatory Visit (HOSPITAL_COMMUNITY): Payer: Self-pay

## 2024-03-09 ENCOUNTER — Other Ambulatory Visit (HOSPITAL_COMMUNITY): Payer: Self-pay

## 2024-03-09 MED ORDER — METHYLPHENIDATE HCL 20 MG PO TABS
20.0000 mg | ORAL_TABLET | Freq: Three times a day (TID) | ORAL | 0 refills | Status: AC
  Filled 2024-03-27: qty 90, 30d supply, fill #0

## 2024-03-09 MED ORDER — METHYLPHENIDATE HCL 20 MG PO TABS
20.0000 mg | ORAL_TABLET | Freq: Three times a day (TID) | ORAL | 0 refills | Status: DC
  Filled 2024-04-24: qty 90, 30d supply, fill #0

## 2024-03-14 ENCOUNTER — Other Ambulatory Visit (HOSPITAL_COMMUNITY): Payer: Self-pay

## 2024-03-15 ENCOUNTER — Other Ambulatory Visit (HOSPITAL_COMMUNITY): Payer: Self-pay

## 2024-03-24 ENCOUNTER — Other Ambulatory Visit (HOSPITAL_COMMUNITY): Payer: Self-pay

## 2024-03-27 ENCOUNTER — Other Ambulatory Visit: Payer: Self-pay

## 2024-03-27 ENCOUNTER — Other Ambulatory Visit (HOSPITAL_COMMUNITY): Payer: Self-pay

## 2024-04-21 ENCOUNTER — Other Ambulatory Visit (HOSPITAL_COMMUNITY): Payer: Self-pay

## 2024-04-21 ENCOUNTER — Encounter (HOSPITAL_COMMUNITY): Payer: Self-pay

## 2024-04-24 ENCOUNTER — Other Ambulatory Visit (HOSPITAL_COMMUNITY): Payer: Self-pay

## 2024-04-24 ENCOUNTER — Encounter: Payer: Self-pay | Admitting: Physician Assistant

## 2024-04-25 ENCOUNTER — Encounter: Payer: Self-pay | Admitting: Family Medicine

## 2024-04-25 ENCOUNTER — Encounter (HOSPITAL_COMMUNITY): Payer: Self-pay

## 2024-04-25 ENCOUNTER — Ambulatory Visit: Payer: Self-pay | Admitting: Family Medicine

## 2024-04-25 ENCOUNTER — Other Ambulatory Visit (HOSPITAL_COMMUNITY): Payer: Self-pay

## 2024-04-25 ENCOUNTER — Ambulatory Visit (INDEPENDENT_AMBULATORY_CARE_PROVIDER_SITE_OTHER): Admitting: Family Medicine

## 2024-04-25 ENCOUNTER — Encounter: Payer: Self-pay | Admitting: Physician Assistant

## 2024-04-25 VITALS — BP 125/83 | HR 93 | Ht 71.0 in | Wt 288.0 lb

## 2024-04-25 DIAGNOSIS — Z6841 Body Mass Index (BMI) 40.0 and over, adult: Secondary | ICD-10-CM

## 2024-04-25 DIAGNOSIS — Z5900 Homelessness unspecified: Secondary | ICD-10-CM

## 2024-04-25 DIAGNOSIS — R319 Hematuria, unspecified: Secondary | ICD-10-CM | POA: Diagnosis present

## 2024-04-25 DIAGNOSIS — D649 Anemia, unspecified: Secondary | ICD-10-CM | POA: Diagnosis not present

## 2024-04-25 DIAGNOSIS — R5382 Chronic fatigue, unspecified: Secondary | ICD-10-CM | POA: Diagnosis not present

## 2024-04-25 DIAGNOSIS — R600 Localized edema: Secondary | ICD-10-CM | POA: Insufficient documentation

## 2024-04-25 DIAGNOSIS — Z79899 Other long term (current) drug therapy: Secondary | ICD-10-CM

## 2024-04-25 DIAGNOSIS — G4733 Obstructive sleep apnea (adult) (pediatric): Secondary | ICD-10-CM

## 2024-04-25 DIAGNOSIS — E291 Testicular hypofunction: Secondary | ICD-10-CM

## 2024-04-25 DIAGNOSIS — D509 Iron deficiency anemia, unspecified: Secondary | ICD-10-CM

## 2024-04-25 DIAGNOSIS — N179 Acute kidney failure, unspecified: Secondary | ICD-10-CM | POA: Insufficient documentation

## 2024-04-25 LAB — POCT URINALYSIS DIP (MANUAL ENTRY)
Bilirubin, UA: NEGATIVE
Blood, UA: NEGATIVE
Glucose, UA: NEGATIVE mg/dL
Ketones, POC UA: NEGATIVE mg/dL
Leukocytes, UA: NEGATIVE
Nitrite, UA: NEGATIVE
Protein Ur, POC: NEGATIVE mg/dL
Spec Grav, UA: 1.025 (ref 1.010–1.025)
Urobilinogen, UA: 0.2 U/dL
pH, UA: 5.5 (ref 5.0–8.0)

## 2024-04-25 MED ORDER — ZEPBOUND 2.5 MG/0.5ML ~~LOC~~ SOAJ
2.5000 mg | SUBCUTANEOUS | 0 refills | Status: DC
  Filled 2024-04-25 – 2024-05-01 (×5): qty 2, 28d supply, fill #0

## 2024-04-25 NOTE — Patient Instructions (Signed)
 Nice seeing you today. I will contact you about your urine test. Please schedule lab appointment as discussed. We will consider diuretics for your leg swelling if your Kidney function improved. We might need a repeat ECHO as well. Please follow up with me in about 2-4 weeks for reassessment.

## 2024-04-25 NOTE — Assessment & Plan Note (Signed)
 Morbid obesity with weight fluctuations. Previous Wegovy  use stopped due to insurance. Weight loss could alleviate sleep apnea and fatigue. - Attempt to obtain Zepbound  for weight loss

## 2024-04-25 NOTE — Assessment & Plan Note (Signed)
 Resolved dark urine episodes post-hospital. Previous AKI with improved function. Urine analysis at the hospital suggests possible UTI s/p A/B therapy. - Order urinalysis to check for blood. (Neg RBC - MyChart message sent) - Schedule lab appointment for kidney function assessment.

## 2024-04-25 NOTE — Assessment & Plan Note (Signed)
 Chronic fatigue persists despite normal labs. Ritalin  improved mental clarity but not physical symptoms. - Discussed referral physical therapy for graded exercise program. - He is amendable to this - Future lab appointment for CBC and TSH check - no [facility] lab draws today

## 2024-04-25 NOTE — Assessment & Plan Note (Signed)
 Bilateral leg edema with possible renal, cardiac, or venous causes. Compression stockings not used. Symptoms include tightness, itching, and weight gain possibly from fluid retention. - Recheck kidney function. - Consider Lasix after kidney function assessment. - Encourage use of compression stockings for a few hours daily. - Reduce salt intake - Consider repeat ECHO - No respiratory symptoms

## 2024-04-25 NOTE — Assessment & Plan Note (Signed)
 Contributes to fatigue, weight gain, and leg edema. CPAP not used. Weight loss could improve symptoms. - Attempt to obtain Zepbound  for OSA and potential weight loss This will improve his overall health status

## 2024-04-25 NOTE — Progress Notes (Signed)
 History of Present Illness Abridge AI used for documentation with the patient's informed verbal consent [name] is a 58 year old male who presents with chronic fatigue and leg swelling.  He has been experiencing significant leg swelling for the past two months, with variability in severity. The swelling is severe, particularly around the ankles, and is sometimes accompanied by a tight feeling and occasional itching. He has not been using compression stockings regularly due to his current living situation, which involves sleeping in a car. No pain in legs, but reports tightness and itching.  He reports persistent fatigue, describing himself as feeling 'completely lifelessly wiped out all the time.' Despite receiving iron  infusions, he does not feel normal. Blood tests sometimes appear normal, but he continues to feel unwell. He has a history of anemia and has been receiving iron  infusions, which have not alleviated his symptoms.  He has experienced episodes of hematuria, first noticed in May, with the urine appearing dark in color. These episodes were intermittent, and he was hospitalized in May and June, during which he was treated for a urinary tract infection as his urine was positive for bacteria and leukocytes. He has not noticed blood in his urine since the hospital visit. No burning with urination.  His current medications include albuterol  as needed, Wellbutrin  300 mg not taken regularly, Ritalin  20 mg three times daily, Protonix  40 mg daily, and Anura and Lytta used regularly. He discontinued Wegovy  due to insurance issues and has not used it for two to three months. He reports a weight change, noting that he was previously on Wegovy  and lost weight but has since gained some back after stopping the medication. His weight was 306 pounds previously and is now 288 pounds.  He describes his living situation as challenging, as he is currently homeless and living in a car, which impacts his  ability to manage his health conditions effectively.  He denies SI, but worries he might die given his current symptoms and living situation  Medications - Albuterol  (as needed) - Wellbutrin  (not regularly) - Ritalin  (methylphenidate ) 20 mg three times a day - Protonix  40 mg daily - Iron  infusions  Physical Exam MEASUREMENTS: Weight- 288. CHEST: Lungs clear to auscultation, no wheezing. CARDIOVASCULAR: Heart sounds normal, no murmur. ABDOMEN: Normal bowel sounds, no tenderness. EXTREMITIES: No calf tenderness, 3+ pitting edema in ankles.  Results LABS Kidney function: Increased (10/2023) Kidney function: Increased (01/2024) Urinalysis: Positive for bacteria and leukocytes (01/2024)  DIAGNOSTIC Echocardiogram: Normal ejection fraction (2023)  Assessment and Plan Chronic fatigue Chronic fatigue persists despite normal labs. Ritalin  improved mental clarity but not physical symptoms. - Discussed referral physical therapy for graded exercise program. - He is amendable to this - Future lab appointment for CBC and TSH check - no [facility] lab draws today  Leg edema Bilateral leg edema with possible renal, cardiac, or venous causes. Compression stockings not used. Symptoms include tightness, itching, and weight gain possibly from fluid retention. - Recheck kidney function. - Consider Lasix after kidney function assessment. - Encourage use of compression stockings for a few hours daily. - Reduce salt intake - Consider repeat ECHO - No respiratory symptoms  History of hematuria and kidney impairement Resolved dark urine episodes post-hospital. Previous AKI with improved function. Urine analysis at the hospital suggests possible UTI s/p A/B therapy. - Order urinalysis to check for blood. (Neg RBC - MyChart message sent) - Schedule lab appointment for kidney function assessment.  Obstructive sleep apnea Severe OSA with comorbidity:  AHI 57.1 an hour  Contributes to fatigue, weight gain,  and leg edema. CPAP not used currently due to homelessness. Weight loss could improve symptoms. - Attempt to obtain Zepbound  for OSA and potential weight loss This will improve his overall health status  Morbid obesity Morbid obesity with weight fluctuations. Previous Wegovy  use stopped due to insurance. Weight loss could alleviate sleep apnea and fatigue. - Attempt to obtain Zepbound  for weight loss  Iron  deficiency anemia Iron  deficiency anemia with normal labs post-infusion but persistent fatigue. Check anemia panel - lab appointment encouraged  Depression Depression with irregular Wellbutrin  use. Feels lifeless, possibly worsened by living conditions and chronic health issues. Denies active SI Placed referral [facility] to help with housing situation  Declined all vaccines offered - Tdap, PCV20 hep B Will readdress.  More than 50% of this 40 mins encounter was spend on evaluation, record review and coordination of care.  Otto Fairly, MD [facility]

## 2024-04-26 ENCOUNTER — Other Ambulatory Visit (HOSPITAL_COMMUNITY): Payer: Self-pay

## 2024-04-26 ENCOUNTER — Encounter: Payer: Self-pay | Admitting: Family Medicine

## 2024-04-27 ENCOUNTER — Other Ambulatory Visit (HOSPITAL_COMMUNITY): Payer: Self-pay

## 2024-04-27 ENCOUNTER — Other Ambulatory Visit

## 2024-04-27 DIAGNOSIS — Z6841 Body Mass Index (BMI) 40.0 and over, adult: Secondary | ICD-10-CM

## 2024-04-27 DIAGNOSIS — E291 Testicular hypofunction: Secondary | ICD-10-CM

## 2024-04-27 DIAGNOSIS — N179 Acute kidney failure, unspecified: Secondary | ICD-10-CM

## 2024-04-27 DIAGNOSIS — Z79899 Other long term (current) drug therapy: Secondary | ICD-10-CM

## 2024-04-27 DIAGNOSIS — R600 Localized edema: Secondary | ICD-10-CM

## 2024-04-27 DIAGNOSIS — R5382 Chronic fatigue, unspecified: Secondary | ICD-10-CM

## 2024-04-27 NOTE — Addendum Note (Signed)
 Addended by: ANDERS OTTO DASEN on: 04/27/2024 08:13 AM   Modules accepted: Orders

## 2024-04-27 NOTE — Addendum Note (Signed)
 Addended by: ANDERS CUMINS T on: 04/27/2024 08:22 AM   Modules accepted: Orders

## 2024-04-27 NOTE — Addendum Note (Signed)
 Addended by: ANDERS OTTO DASEN on: 04/27/2024 08:31 AM   Modules accepted: Orders

## 2024-04-28 ENCOUNTER — Telehealth (HOSPITAL_COMMUNITY): Payer: Self-pay

## 2024-04-28 ENCOUNTER — Telehealth: Payer: Self-pay

## 2024-04-28 ENCOUNTER — Other Ambulatory Visit (HOSPITAL_COMMUNITY): Payer: Self-pay

## 2024-04-28 ENCOUNTER — Other Ambulatory Visit: Payer: Self-pay | Admitting: Family Medicine

## 2024-04-28 ENCOUNTER — Telehealth (HOSPITAL_COMMUNITY): Payer: Self-pay | Admitting: Family Medicine

## 2024-04-28 ENCOUNTER — Other Ambulatory Visit (HOSPITAL_COMMUNITY): Payer: Self-pay | Admitting: Family Medicine

## 2024-04-28 ENCOUNTER — Encounter: Payer: Self-pay | Admitting: Physician Assistant

## 2024-04-28 ENCOUNTER — Other Ambulatory Visit: Payer: Self-pay

## 2024-04-28 DIAGNOSIS — D508 Other iron deficiency anemias: Secondary | ICD-10-CM

## 2024-04-28 MED ORDER — TORSEMIDE 10 MG PO TABS
10.0000 mg | ORAL_TABLET | Freq: Every day | ORAL | 0 refills | Status: DC
  Filled 2024-04-28: qty 14, 14d supply, fill #0

## 2024-04-28 MED ORDER — FOLIC ACID 800 MCG PO TABS
0.4000 mg | ORAL_TABLET | Freq: Every day | ORAL | 99 refills | Status: AC
  Filled 2024-04-28: qty 45, 90d supply, fill #0

## 2024-04-28 NOTE — Telephone Encounter (Signed)
 Patient referred to infusion pharmacy team for ambulatory infusion of IV iron .  Insurance - [place] Medicaid  Site of care - Site of care: [facility] Dx code - D50.9 IV Iron  Therapy - Feraheme 510 mg IV x 2  Infusion appointments - Scheduling team will schedule patient as soon as possible.    [name] D. [name], PharmD

## 2024-04-28 NOTE — Telephone Encounter (Signed)
 Prior authorization submitted for ZEPBOUND  to [facility] Medicaid via Latent.   Key: [id number]

## 2024-04-28 NOTE — Telephone Encounter (Signed)
 Auth Submission: NO AUTH NEEDED Site of care: Site of care: [facility] Payer: [employer] Medication & CPT/J Code(s) submitted: Feraheme (ferumoxytol ) [id number] Diagnosis Code: D50.9 Route of submission (phone, fax, portal):  Phone # Fax # Auth type: Buy/Bill HB Units/visits requested: 510mg  x 2 doses Reference number:  Approval from: 04/28/24 to 07/29/24

## 2024-05-01 ENCOUNTER — Other Ambulatory Visit (HOSPITAL_COMMUNITY): Payer: Self-pay

## 2024-05-01 ENCOUNTER — Other Ambulatory Visit (HOSPITAL_COMMUNITY)
Admission: RE | Admit: 2024-05-01 | Discharge: 2024-05-01 | Disposition: A | Discharge status: Home / Self Care | Payer: Self-pay | Source: Ambulatory Visit | Attending: Oncology | Admitting: Oncology

## 2024-05-01 NOTE — Telephone Encounter (Signed)
 Pharmacy Patient Advocate Encounter  Received notification from [facility] that Prior Authorization for ZEPBOUND  has been DENIED.  Full denial letter will be uploaded to the media tab. See denial reason below.     Patient will need to restart on Wegovy . If there is a failure after 3-6 consistent months, zepbound  PA can be resubmitted.  PA #/Case ID/Reference #: [id number]

## 2024-05-02 LAB — ANEMIA PROFILE B
Basophils Absolute: 0.1 x10E3/uL (ref 0.0–0.2)
Basos: 1 %
EOS (ABSOLUTE): 0.6 x10E3/uL — ABNORMAL HIGH (ref 0.0–0.4)
Eos: 8 %
Ferritin: 10 ng/mL — ABNORMAL LOW (ref 30–400)
Folate: 3 ng/mL — ABNORMAL LOW (ref >3.0)
Hematocrit: 39.9 % (ref 37.5–51.0)
Hemoglobin: 12.2 g/dL — ABNORMAL LOW (ref 13.0–17.7)
Immature Grans (Abs): 0 x10E3/uL (ref 0.0–0.1)
Immature Granulocytes: 0 %
Iron Saturation: 6 % — CL (ref 15–55)
Iron: 23 ug/dL — ABNORMAL LOW (ref 38–169)
Lymphocytes Absolute: 1 x10E3/uL (ref 0.7–3.1)
Lymphs: 12 %
MCH: 24.7 pg — ABNORMAL LOW (ref 26.6–33.0)
MCHC: 30.6 g/dL — ABNORMAL LOW (ref 31.5–35.7)
MCV: 81 fL (ref 79–97)
Monocytes Absolute: 0.5 x10E3/uL (ref 0.1–0.9)
Monocytes: 7 %
Neutrophils Absolute: 5.9 x10E3/uL (ref 1.4–7.0)
Neutrophils: 72 %
Platelets: 409 x10E3/uL (ref 150–450)
RBC: 4.93 x10E6/uL (ref 4.14–5.80)
RDW: 12.7 % (ref 11.6–15.4)
Retic Ct Pct: 1.3 % (ref 0.6–2.6)
Total Iron Binding Capacity: 401 ug/dL (ref 250–450)
UIBC: 378 ug/dL — ABNORMAL HIGH (ref 111–343)
Vitamin B-12: 388 pg/mL (ref 232–1245)
WBC: 8.1 x10E3/uL (ref 3.4–10.8)

## 2024-05-02 LAB — CORTISOL-AM, BLOOD: Cortisol - AM: 18.4 ug/dL (ref 6.2–19.4)

## 2024-05-02 LAB — CMP14+EGFR
ALT: 14 IU/L (ref 0–44)
AST: 16 IU/L (ref 0–40)
Albumin: 4.1 g/dL (ref 3.8–4.9)
Alkaline Phosphatase: 108 IU/L (ref 44–121)
BUN/Creatinine Ratio: 15 (ref 9–20)
BUN: 17 mg/dL (ref 6–24)
Bilirubin Total: 0.2 mg/dL (ref 0.0–1.2)
CO2: 21 mmol/L (ref 20–29)
Calcium: 9.4 mg/dL (ref 8.7–10.2)
Chloride: 101 mmol/L (ref 96–106)
Creatinine, Ser: 1.12 mg/dL (ref 0.76–1.27)
Globulin, Total: 2.7 g/dL (ref 1.5–4.5)
Glucose: 107 mg/dL — ABNORMAL HIGH (ref 70–99)
Potassium: 4.5 mmol/L (ref 3.5–5.2)
Sodium: 138 mmol/L (ref 134–144)
Total Protein: 6.8 g/dL (ref 6.0–8.5)
eGFR: 76 mL/min/1.73 (ref >59)

## 2024-05-02 LAB — TESTOSTERONE,FREE AND TOTAL
Testosterone, Free: 2.6 pg/mL — ABNORMAL LOW (ref 7.2–24.0)
Testosterone: 238 ng/dL — ABNORMAL LOW (ref 264–916)

## 2024-05-02 LAB — TSH RFX ON ABNORMAL TO FREE T4: TSH: 2.6 u[IU]/mL (ref 0.450–4.500)

## 2024-05-02 LAB — VITAMIN D 25 HYDROXY (VIT D DEFICIENCY, FRACTURES): Vit D, 25-Hydroxy: 36.4 ng/mL (ref 30.0–100.0)

## 2024-05-03 NOTE — Telephone Encounter (Signed)
 He can restart Wegovy  from the 0.25/0.5mg  strength BUT his documentation will need to include detailed records of diet recommendations (caloric intake, etc), exercise plans (amount of time/wk, etc), and an emphasis on the patient's commitment to continuing these efforts while on medication. He may need to be seen again (depending on how you'd like to move forward!).  Just let me know, and I'll submit another PA :)

## 2024-05-03 NOTE — Telephone Encounter (Signed)
 Left the patient a voicemail informing him about message.

## 2024-05-05 ENCOUNTER — Telehealth: Payer: Self-pay | Admitting: Pharmacy Technician

## 2024-05-05 NOTE — Telephone Encounter (Signed)
 Auth Submission: NO AUTH NEEDED Site of care: Site of care: [facility] Payer: [employer] Medication & CPT/J Code(s) submitted: Feraheme (ferumoxytol ) [id number] Diagnosis Code: D50.9 Route of submission (phone, fax, portal):  Phone # Fax # Auth type: Buy/Bill PB Units/visits requested: X2 DOSES Reference number:  Approval from: 05/05/24 to 08/05/24

## 2024-05-09 ENCOUNTER — Other Ambulatory Visit (HOSPITAL_COMMUNITY): Payer: Self-pay

## 2024-05-09 LAB — GENECONNECT MOLECULAR SCREEN: Genetic Analysis Overall Interpretation: NEGATIVE

## 2024-05-11 ENCOUNTER — Ambulatory Visit (INDEPENDENT_AMBULATORY_CARE_PROVIDER_SITE_OTHER)

## 2024-05-11 VITALS — BP 120/80 | HR 90 | Temp 98.0°F | Resp 18 | Ht 71.0 in | Wt 285.4 lb

## 2024-05-11 DIAGNOSIS — D500 Iron deficiency anemia secondary to blood loss (chronic): Secondary | ICD-10-CM

## 2024-05-11 DIAGNOSIS — D508 Other iron deficiency anemias: Secondary | ICD-10-CM

## 2024-05-11 DIAGNOSIS — K922 Gastrointestinal hemorrhage, unspecified: Secondary | ICD-10-CM

## 2024-05-11 MED ORDER — SODIUM CHLORIDE 0.9 % IV SOLN
510.0000 mg | Freq: Once | INTRAVENOUS | Status: AC
  Administered 2024-05-11: 510 mg via INTRAVENOUS
  Filled 2024-05-11: qty 17

## 2024-05-11 NOTE — Progress Notes (Signed)
 Diagnosis: Iron  Deficiency Anemia  Provider:  Praveen Mannam MD  Procedure: IV Infusion  IV Type: Peripheral, IV Location: R Forearm  Feraheme (Ferumoxytol ), Dose: 510 mg  Infusion Start Time: 1129  Infusion Stop Time: 1149  Post Infusion IV Care: Patient declined observation and Peripheral IV Discontinued  Discharge: Condition: Good, Destination: Home . AVS Declined  Performed by:  Tyric Rodeheaver, RN

## 2024-05-15 ENCOUNTER — Ambulatory Visit (INDEPENDENT_AMBULATORY_CARE_PROVIDER_SITE_OTHER): Admitting: Family Medicine

## 2024-05-15 ENCOUNTER — Other Ambulatory Visit (HOSPITAL_COMMUNITY): Payer: Self-pay

## 2024-05-15 ENCOUNTER — Encounter: Payer: Self-pay | Admitting: Family Medicine

## 2024-05-15 VITALS — BP 136/80 | HR 99 | Wt 288.0 lb

## 2024-05-15 DIAGNOSIS — D508 Other iron deficiency anemias: Secondary | ICD-10-CM

## 2024-05-15 DIAGNOSIS — Z6841 Body Mass Index (BMI) 40.0 and over, adult: Secondary | ICD-10-CM | POA: Diagnosis not present

## 2024-05-15 DIAGNOSIS — R0600 Dyspnea, unspecified: Secondary | ICD-10-CM

## 2024-05-15 DIAGNOSIS — R600 Localized edema: Secondary | ICD-10-CM

## 2024-05-15 MED ORDER — WEGOVY 0.5 MG/0.5ML ~~LOC~~ SOAJ
0.5000 mg | SUBCUTANEOUS | 1 refills | Status: DC
  Filled 2024-05-15 – 2024-05-18 (×3): qty 2, 28d supply, fill #0

## 2024-05-15 NOTE — Progress Notes (Signed)
 SUBJECTIVE:   CHIEF COMPLAINT / HPI:   Discussed the use of AI scribe software for clinical note transcription with the patient, who gave verbal consent to proceed.  History of Present Illness   [name] is a 58 year old male who presents for follow-up and discussion about Wegovy .  He has been experiencing leg swelling, initially presenting as significant edema. He reports that the swelling decreases after blood draws but returns within a day or two. The swelling decreases after blood draws but returns within a day or two. There was a slight improvement in swelling before starting torsemide .  He has a history of anemia contributing to chronic fatigue, limiting his ability to exercise. He describes feeling 'lifeless' rather than just fatigued. Despite a recent infusion, his energy levels have not improved significantly, and he is scheduled for another infusion soon.  He has gained weight, currently weighing 288.4 pounds, up from 285 pounds. He has not exercised in the past six months due to anemia and sleep apnea, which affects his breathing. He previously tried Wegovy  without complications and found it effective, though not as fast as desired.  He is using testosterone  and experiences frequent urination, which he associates with the medication's effectiveness. He has not noticed significant changes in energy levels with testosterone  supplementation and has tried natural supplements without benefit.  He is facing social challenges, including homelessness, sleeping in his car, and running low on gasoline and food stamps. He has not received contact from the physical therapist or social worker despite authorized referrals and expresses a need for assistance with food and other necessities.       PERTINENT  PMH / PSH: PMHx reviewed  OBJECTIVE:   BP 136/80   Pulse 99   Wt 288 lb (130.6 kg)   SpO2 98%   BMI 40.17 kg/m   Physical Exam   VITALS: BP- 136/80 MEASUREMENTS: Weight-  288.4. NECK: Thyroid normal, no thyromegaly. CHEST: Lungs clear to auscultation bilaterally. CARDIOVASCULAR: Heart normal, no murmur, regular rate and rhythm. EXTREMITIES: Trace edema on right, 2+ edema on left, tender, no erythema.       ASSESSMENT/PLAN:   Assessment & Plan    Assessment and Plan    Obesity Obesity with recent weight gain. Previous Wegovy  use was effective but not fast enough. Insurance issues with Zepbound  require Wegovy  restart. - He tried diet and exercise remotely with no success. However, with chronic fatigue from iron  deficiency anemia and sleep apnea, he has been unable to exercise recently. - Initiate prior authorization for Wegovy  at a 0.5 mg dose. - Send prescription for Wegovy  to pharmacy. - Coordinate with pharmacy tech for prior authorization process. - Discuss potential transition to Zepbound  if Wegovy  is ineffective.  Chronic bilateral lower extremity edema Chronic edema with improvement. Currently on torsemide  10 mg prn. Kidney function is normal, and the echocardiogram is normal. - Continue torsemide  10 mg until the current supply is finished. - Discontinue torsemide  after the current supply and monitor for recurrence of edema. - If edema worsens after discontinuation, consider resuming torsemide  for a longer period. - Last ECHO from 2023 was essentially normal. Repeat ECHO ordered. CMA to contact to schedule an appointment for ECHO.  Anemia Chronic anemia contributes to fatigue. Recent infusion completed, another scheduled for September 11. - Continue with scheduled infusion on September 11.  Dyspnea Dyspnea associated with chronic edema. Echocardiogram normal, CT scan ruled out pulmonary embolism. Further evaluation needed. - Order an echocardiogram to reassess cardiac function. -  Schedule echocardiogram and ensure prior authorization if needed.   Declined vaccines - Flu shot, Covid shot, Hep B, Tdap, and PCV offered.     Social  concerns: Risk for food insecurity and homelessness Already placed referral to [facility] We gave him total of $30 gift card from [employer] and [employer] Also three non-expired canned food self selected by him based on his preference   Otto Fairly, MD [facility]

## 2024-05-15 NOTE — Patient Instructions (Addendum)
 Referral sent to [name] [address] floor [phone] They will call the patient to schedule an appointment. Margit Dimes, CMA  Obesity, Adult Obesity is having too much body fat. Being obese means that your weight is more than what is healthy for you.  BMI (body mass index) is a number that explains how much body fat you have. If you have a BMI of 30 or more, you are obese. Obesity can cause serious health problems, such as: Stroke. Coronary artery disease (CAD). Type 2 diabetes. Some types of cancer. High blood pressure (hypertension). High cholesterol. Gallbladder stones. Obesity can also contribute to: Osteoarthritis. Sleep apnea. Infertility problems. What are the causes? Eating meals each day that are high in calories, sugar, and fat. Drinking a lot of drinks that have sugar in them. Being born with genes that may make you more likely to become obese. Having a medical condition that causes obesity. Taking certain medicines. Sitting a lot (having a sedentary lifestyle). Not getting enough sleep. What increases the risk? Having a family history of obesity. Living in an area with limited access to: [place], recreation centers, or sidewalks. Healthy food choices, such as grocery stores and farmers' markets. What are the signs or symptoms? The main sign is having too much body fat. How is this treated? Treatment for this condition often includes changing your lifestyle. Treatment may include: Changing your diet. This may include making a healthy meal plan. Exercise. This may include activity that causes your heart to beat faster (aerobic exercise) and strength training. Work with your doctor to design a program that works for you. Medicine to help you lose weight. This may be used if you are not able to lose one pound a week after 6 weeks of healthy eating and more exercise. Treating conditions that cause the obesity. Surgery.  Options may include gastric banding and gastric bypass. This may be done if: Other treatments have not helped to improve your condition. You have a BMI of 40 or higher. You have life-threatening health problems related to obesity. Follow these instructions at home: Eating and drinking  Follow advice from your doctor about what to eat and drink. Your doctor may tell you to: Limit fast food, sweets, and processed snack foods. Choose low-fat options. For example, choose low-fat milk instead of whole milk. Eat five or more servings of fruits or vegetables each day. Eat at home more often. This gives you more control over what you eat. Choose healthy foods when you eat out. Learn to read food labels. This will help you learn how much food is in one serving. Keep low-fat snacks available. Avoid drinks that have a lot of sugar in them. These include soda, fruit juice, iced tea with sugar, and flavored milk. Drink enough water to keep your pee (urine) pale yellow. Do not go on fad diets. Physical activity Exercise often, as told by your doctor. Most adults should get up to 150 minutes of moderate-intensity exercise every week.Ask your doctor: What types of exercise are safe for you. How often you should exercise. Warm up and stretch before being active. Do slow stretching after being active (cool down). Rest between times of being active. Lifestyle Work with your doctor and a food expert (dietitian) to set a weight-loss goal that is best for you. Limit your screen time. Find ways to reward yourself that do not involve food. Do not drink alcohol if: Your doctor tells you not to drink. You are  pregnant, may be pregnant, or are planning to become pregnant. If you drink alcohol: Limit how much you have to: 0-1 drink a day for women. 0-2 drinks a day for men. Know how much alcohol is in your drink. In the [place] one drink equals one 12 oz bottle of beer (355 mL), one 5 oz glass of wine (148  mL), or one 1 oz glass of hard liquor (44 mL). General instructions Keep a weight-loss journal. This can help you keep track of: The food that you eat. How much exercise you get. Take over-the-counter and prescription medicines only as told by your doctor. Take vitamins and supplements only as told by your doctor. Think about joining a support group. Pay attention to your mental health as obesity can lead to depression or self esteem issues. Keep all follow-up visits. Contact a doctor if: You cannot meet your weight-loss goal after you have changed your diet and lifestyle for 6 weeks. You are having trouble breathing. Summary Obesity is having too much body fat. Being obese means that your weight is more than what is healthy for you. Work with your doctor to set a weight-loss goal. Get regular exercise as told by your doctor. This information is not intended to replace advice given to you by your health care provider. Make sure you discuss any questions you have with your health care provider. Document Revised: 04/01/2021 Document Reviewed: 04/01/2021 Elsevier Patient Education  2024 [employer].

## 2024-05-15 NOTE — Assessment & Plan Note (Signed)
 Obesity with recent weight gain. Previous Wegovy  use was effective but not fast enough. Insurance issues with Zepbound  require Wegovy  restart. - He tried diet and exercise remotely with no success. However, with chronic fatigue from iron  deficiency anemia and sleep apnea, he has been unable to exercise recently. - Initiate prior authorization for Wegovy  at a 0.5 mg dose. - Send prescription for Wegovy  to pharmacy. - Coordinate with pharmacy tech for prior authorization process. - Discuss potential transition to Zepbound  if Wegovy  is ineffective

## 2024-05-15 NOTE — Assessment & Plan Note (Signed)
 Chronic anemia contributes to fatigue. Recent infusion completed, another scheduled for September 11. - Continue with scheduled infusion on September 11

## 2024-05-15 NOTE — Assessment & Plan Note (Signed)
 Chronic edema with improvement. Currently on torsemide  10 mg prn. Kidney function is normal, and the echocardiogram is normal. - Continue torsemide  10 mg until the current supply is finished. - Discontinue torsemide  after the current supply and monitor for recurrence of edema. - If edema worsens after discontinuation, consider resuming torsemide  for a longer period. - Last ECHO from 2023 was essentially normal. Repeat ECHO ordered. CMA to contact to schedule an appointment for ECHO.

## 2024-05-16 ENCOUNTER — Encounter (HOSPITAL_COMMUNITY): Payer: Self-pay

## 2024-05-16 ENCOUNTER — Encounter: Payer: Self-pay | Admitting: Physician Assistant

## 2024-05-16 ENCOUNTER — Other Ambulatory Visit (HOSPITAL_COMMUNITY): Payer: Self-pay

## 2024-05-16 ENCOUNTER — Other Ambulatory Visit: Payer: Self-pay | Admitting: Family Medicine

## 2024-05-16 ENCOUNTER — Encounter: Payer: Self-pay | Admitting: Family Medicine

## 2024-05-16 ENCOUNTER — Telehealth: Payer: Self-pay

## 2024-05-16 NOTE — Progress Notes (Signed)
 Margit, sent PA to be approved. And stated that she sent it over for the receptionist that schedules ECHO's. Nelson Land, CMA

## 2024-05-16 NOTE — Telephone Encounter (Signed)
 Auth Submission: PENDING  Site of care: [facility] Payer: [employer]  Medication & CPT/J Code(s) submitted: WEGOVY  0.5MG   Diagnosis Code: Z68.41 - Body mass index [BMI] 40.0-44.9, adult  Route of submission (phone, fax, portal): Latent  Auth type: Pharmacy Benefit  Units/visits requested: 2 Milliliters   Reference number: [id number]

## 2024-05-16 NOTE — Telephone Encounter (Signed)
-----   Message from Otto Fairly sent at 05/15/2024  3:14 PM EDT ----- Randie Gammons,  I sent in Wegovy  to the pharmacy for this patient. Please help with prior auth.

## 2024-05-17 ENCOUNTER — Telehealth: Payer: Self-pay | Admitting: *Deleted

## 2024-05-17 NOTE — Telephone Encounter (Signed)
 Pharmacy Patient Advocate Encounter  Received notification from [facility] MEDICAID that Prior Authorization for WEGOVY  0.5MG  has been APPROVED from 05/16/24 to 11/13/24   PA #/Case ID/Reference #: [id number]

## 2024-05-17 NOTE — Progress Notes (Signed)
 Complex Care Management Note Care Guide Note  05/17/2024 Name: [name] MRN: [id number] DOB: 1965/09/10   Complex Care Management Outreach Attempts: An unsuccessful telephone outreach was attempted today to offer the patient information about available complex care management services.  Follow Up Plan:  Additional outreach attempts will be made to offer the patient complex care management information and services.   Encounter Outcome:  No Answer  Harlene Satterfield  [facility]  [facility], [facility] Guide  Direct Dial: [phone]  Fax [phone]

## 2024-05-18 ENCOUNTER — Other Ambulatory Visit (HOSPITAL_COMMUNITY): Payer: Self-pay

## 2024-05-18 ENCOUNTER — Ambulatory Visit

## 2024-05-18 VITALS — BP 138/84 | HR 73 | Temp 98.3°F | Resp 20 | Ht 71.0 in | Wt 291.8 lb

## 2024-05-18 DIAGNOSIS — D508 Other iron deficiency anemias: Secondary | ICD-10-CM

## 2024-05-18 DIAGNOSIS — K922 Gastrointestinal hemorrhage, unspecified: Secondary | ICD-10-CM

## 2024-05-18 DIAGNOSIS — D500 Iron deficiency anemia secondary to blood loss (chronic): Secondary | ICD-10-CM

## 2024-05-18 MED ORDER — SODIUM CHLORIDE 0.9 % IV SOLN
510.0000 mg | Freq: Once | INTRAVENOUS | Status: AC
  Administered 2024-05-18: 510 mg via INTRAVENOUS
  Filled 2024-05-18: qty 17

## 2024-05-18 NOTE — Addendum Note (Signed)
 Addended by: Tyla Burgner R on: 05/18/2024 12:50 PM   Modules accepted: Orders

## 2024-05-18 NOTE — Progress Notes (Signed)
 Complex Care Management Note  Care Guide Note 05/18/2024 Name: [name] MRN: [id number] DOB: 10/24/1965  [name] is a 58 y.o. year old male who sees Anders Otto DASEN, MD for primary care. I reached out to Jeffery LELON Daughters by phone today to offer complex care management services.  [name] was given information about Complex Care Management services today including:   The Complex Care Management services include support from the care team which includes your Nurse Care Manager, Clinical Social Worker, or Pharmacist.  The Complex Care Management team is here to help remove barriers to the health concerns and goals most important to you. Complex Care Management services are voluntary, and the patient may decline or stop services at any time by request to their care team member.   Complex Care Management Consent Status: Patient agreed to services and verbal consent obtained.   Follow up plan:  Telephone appointment with complex care management team member scheduled for:  9/15  Encounter Outcome:  Patient Scheduled  Harlene Satterfield  [facility]  [facility], [facility] Guide  Direct Dial: [phone]  Fax [phone]

## 2024-05-18 NOTE — Progress Notes (Signed)
 Diagnosis: Iron  Deficiency Anemia  Provider:  Praveen Mannam MD  Procedure: IV Infusion  IV Type: Peripheral, IV Location: L Antecubital  Feraheme (Ferumoxytol ), Dose: 510 mg  Infusion Start Time: 1205  Infusion Stop Time: 1221  Post Infusion IV Care: Patient declined observation and Peripheral IV Discontinued  Discharge: Condition: Good, Destination: Home . AVS Declined  Performed by:  Maximiano JONELLE Pouch, LPN

## 2024-05-19 ENCOUNTER — Other Ambulatory Visit (HOSPITAL_COMMUNITY): Payer: Self-pay

## 2024-05-19 ENCOUNTER — Ambulatory Visit (HOSPITAL_COMMUNITY)
Admission: RE | Admit: 2024-05-19 | Discharge: 2024-05-19 | Disposition: A | Discharge status: Home / Self Care | Source: Ambulatory Visit | Attending: Family Medicine | Admitting: Family Medicine

## 2024-05-19 DIAGNOSIS — R0600 Dyspnea, unspecified: Secondary | ICD-10-CM | POA: Diagnosis present

## 2024-05-19 DIAGNOSIS — R0609 Other forms of dyspnea: Secondary | ICD-10-CM

## 2024-05-19 DIAGNOSIS — G4730 Sleep apnea, unspecified: Secondary | ICD-10-CM | POA: Insufficient documentation

## 2024-05-19 DIAGNOSIS — E785 Hyperlipidemia, unspecified: Secondary | ICD-10-CM | POA: Insufficient documentation

## 2024-05-19 LAB — ECHOCARDIOGRAM COMPLETE
Area-P 1/2: 4.33 cm2
Calc EF: 64.9 %
S' Lateral: 2.4 cm
Single Plane A2C EF: 66.1 %
Single Plane A4C EF: 63.5 %

## 2024-05-20 ENCOUNTER — Ambulatory Visit: Payer: Self-pay | Admitting: Family Medicine

## 2024-05-22 ENCOUNTER — Other Ambulatory Visit: Payer: Self-pay

## 2024-05-22 NOTE — Patient Instructions (Signed)
 Visit Information  Thank you for taking time to visit with me today. Please don't hesitate to contact me if I can be of assistance to you before our next scheduled appointment.  Our next appointment is no further scheduled appointments.   Please call the care guide team at [phone] if you need to cancel or reschedule your appointment.   Please call the Suicide and Crisis Lifeline: 988 call the [place]  National Suicide Prevention Lifeline: 920-258-5540 or TTY: 8651411891 TTY (802) 001-7399) to talk to a trained counselor call 1-800-273-TALK (toll free, 24 hour hotline) go to [facility] [address] [place] [phone]) call 911 if you are experiencing a Mental Health or Behavioral Health Crisis or need someone to talk to.  Patient verbalizes understanding of instructions and care plan provided today and agrees to view in MyChart. Active MyChart status and patient understanding of how to access instructions and care plan via MyChart confirmed with patient.     Orlean Fey, BSW [place]  [facility] Social Worker, [facility] [phone]

## 2024-05-22 NOTE — Patient Outreach (Signed)
 Complex Care Management   Visit Note  05/22/2024  Name:  [name] MRN: [id number] DOB: 1965-11-13  Situation: Referral received for Complex Care Management related to SDOH Barriers:  Housing   Food insecurity Financial Resource Strain I obtained verbal consent from Patient.  Visit completed with Patient  on the phone  Background:   Past Medical History:  Diagnosis Date   ADD (attention deficit disorder)    ADD (attention deficit disorder)    Allergy    Anemia, iron  deficiency    Anxiety    Asthma    Blood transfusion    Constipation    Depression    Depression    Exertional dyspnea    secondary to anemia   GERD (gastroesophageal reflux disease)    Hiatal hernia    Narcolepsy    pt also reports episodes of passing out that are unrelated to narcolepsy about a year ago   Sleep apnea 01/2018   has a CPAP, unable to use it    Assessment:  BSW spoke with the patient today to assess for SDOH barriers. During the call, it was determined that food insecurity, housing instability, and financial resource strain are barriers for this patient. The patient reported that he has been living in his car for several years and has never applied for Section 8 housing. He further stated that he has not been to the dentist in six months and has not had an eye exam within the past twelve months. The patient indicated that he does not have a monthly income. He also reported that he has recently noticed swelling in his feet and plans to inform his PCP at his next appointment. The patient stated that while he has transportation to medical appointments, he sometimes lacks the gas needed to attend.  BSW will provide the patient with resources for Section 8 housing through the [employer]. The patient will also be provided with food pantry resources, including Out of the [employer] and [employer]. In addition, BSW will provide a list of community meal programs where the  patient can receive hot meals, including [facility], [employer] , [place] [employer], [employer] de [employer] at [employer]. [employer], [employer] [address] [employer] , [employer] Dish & [employer], and [employer]. Financial assistance resources will include [facility] DSS Emergency Assistance. Dental resources will include [facility] Department of Public Health Dental Clinic and [facility]. Eye care resources will include Health Net Vision Program and [employer] & Eyeg[employer]. Gas assistance resources will include Energy manager. BSW advised the patient to reach out at any time if he has further questions or needs additional support.BSW will close out the case at this time.   SDOH Interventions Today    Flowsheet Row Most Recent Value  SDOH Interventions   Food Insecurity Interventions Community Resources Provided  Housing Interventions Community Resources Provided  Financial Strain Interventions Community Resources Provided      Recommendation:   No recommendations at this time.  Follow Up Plan:   Patient has met all care management goals. Care Management case will be closed. Patient has been provided contact information should new needs arise.   [name], BSW [place]  [facility] Social Worker, [facility] [phone]

## 2024-05-24 ENCOUNTER — Other Ambulatory Visit (HOSPITAL_COMMUNITY): Payer: Self-pay

## 2024-05-24 MED ORDER — METHYLPHENIDATE HCL 20 MG PO TABS
20.0000 mg | ORAL_TABLET | Freq: Three times a day (TID) | ORAL | 0 refills | Status: AC
  Filled 2024-05-24 – 2024-08-04 (×3): qty 90, 30d supply, fill #0

## 2024-05-24 MED ORDER — METHYLPHENIDATE HCL 20 MG PO TABS
20.0000 mg | ORAL_TABLET | Freq: Three times a day (TID) | ORAL | 0 refills | Status: AC
  Filled 2024-06-26: qty 90, 30d supply, fill #0

## 2024-05-24 MED ORDER — METHYLPHENIDATE HCL 20 MG PO TABS
20.0000 mg | ORAL_TABLET | Freq: Three times a day (TID) | ORAL | 0 refills | Status: AC
  Filled 2024-05-24: qty 90, 30d supply, fill #0

## 2024-05-29 ENCOUNTER — Encounter: Payer: Self-pay | Admitting: Hematology and Oncology

## 2024-05-30 ENCOUNTER — Telehealth: Payer: Self-pay | Admitting: Hematology and Oncology

## 2024-06-02 ENCOUNTER — Encounter: Payer: Self-pay | Admitting: *Deleted

## 2024-06-06 ENCOUNTER — Ambulatory Visit: Admitting: Family Medicine

## 2024-06-08 ENCOUNTER — Telehealth: Payer: Self-pay

## 2024-06-08 ENCOUNTER — Other Ambulatory Visit (HOSPITAL_COMMUNITY): Payer: Self-pay

## 2024-06-08 MED ORDER — TIRZEPATIDE-WEIGHT MANAGEMENT 10 MG/0.5ML ~~LOC~~ SOAJ
10.0000 mg | SUBCUTANEOUS | 1 refills | Status: DC
  Filled 2024-06-08 – 2024-06-16 (×2): qty 2, 28d supply, fill #0
  Filled 2024-07-11: qty 2, 28d supply, fill #1

## 2024-06-08 NOTE — Telephone Encounter (Signed)
 PA for Zepbound  started on new encounter.

## 2024-06-08 NOTE — Telephone Encounter (Signed)
 Patient contacted for follow-up of PA for Wegovy  (semaglutide ) and need for change to Zepbound  (tirzepatide ) for OSA from Medicaid.   Since last contact patient reports he has been taking Wegovy  (semaglutide ) at the dose of 0.5mg  weekly.  He also reports he has tolerated the max dose of Wegovy  (semaglutide ) 2.4 mg weekly in the past.   He is interested and willing to transition to Zepbound  (tirzepatide ) as this is now recognized by Medicaid and should likely be approved through PA as his most recent sleep study was performed within the last year.   Current Medications include: Wegovy  (semaglutide ) 0.5mg  weekly.  Patient denies any significant medication related side effects.  Medication Plan: - Take last two remaining injection devices of Wegovy  (semaglutide ) 2 X 0.5mg  (1mg  total) today.  - THEN anticipate transition to Zepbound  (tirzepatide ) with new Rx being sent to day - following anticipated PA approval.  Patient is aware the PA process may take 1-2 days.    Asked to contact our office mid next week if he is without another dose of a GLP agent.   Total time with patient call and documentation of interaction: 17 minutes.  F/U Phone call planned: None

## 2024-06-08 NOTE — Addendum Note (Signed)
 Addended by: Ai Sonnenfeld G on: 06/08/2024 11:26 AM   Modules accepted: Orders

## 2024-06-08 NOTE — Telephone Encounter (Signed)
 Prior authorization submitted for ZEBOUND 10MG  to [employer]ICA[employer] via Latent.   Key: [id number]

## 2024-06-09 ENCOUNTER — Ambulatory Visit: Admitting: Family Medicine

## 2024-06-09 NOTE — Telephone Encounter (Signed)
 Resubmitted with sleep study and chart notes.   Key [id number]

## 2024-06-12 NOTE — Telephone Encounter (Signed)
 Pharmacy Patient Advocate Encounter  Received notification from [facility] MEDICAID that Prior Authorization for ZEPBOUND  10MG  has been APPROVED from 06/09/24 to 12/08/24   PA #/Case ID/Reference #: [id number]

## 2024-06-12 NOTE — Telephone Encounter (Signed)
 Reviewed and agree with Dr Rennis plan.

## 2024-06-16 ENCOUNTER — Other Ambulatory Visit (HOSPITAL_COMMUNITY): Payer: Self-pay

## 2024-06-19 ENCOUNTER — Inpatient Hospital Stay: Attending: Hematology and Oncology

## 2024-06-19 ENCOUNTER — Inpatient Hospital Stay: Admitting: Hematology and Oncology

## 2024-06-19 ENCOUNTER — Other Ambulatory Visit (HOSPITAL_COMMUNITY): Payer: Self-pay

## 2024-06-19 ENCOUNTER — Telehealth: Payer: Self-pay | Admitting: Pharmacist

## 2024-06-19 DIAGNOSIS — K648 Other hemorrhoids: Secondary | ICD-10-CM | POA: Insufficient documentation

## 2024-06-19 DIAGNOSIS — K2091 Esophagitis, unspecified with bleeding: Secondary | ICD-10-CM | POA: Insufficient documentation

## 2024-06-19 DIAGNOSIS — R110 Nausea: Secondary | ICD-10-CM | POA: Insufficient documentation

## 2024-06-19 DIAGNOSIS — K2971 Gastritis, unspecified, with bleeding: Secondary | ICD-10-CM | POA: Insufficient documentation

## 2024-06-19 DIAGNOSIS — K449 Diaphragmatic hernia without obstruction or gangrene: Secondary | ICD-10-CM | POA: Insufficient documentation

## 2024-06-19 DIAGNOSIS — N281 Cyst of kidney, acquired: Secondary | ICD-10-CM | POA: Insufficient documentation

## 2024-06-19 DIAGNOSIS — K2980 Duodenitis without bleeding: Secondary | ICD-10-CM | POA: Insufficient documentation

## 2024-06-19 DIAGNOSIS — D500 Iron deficiency anemia secondary to blood loss (chronic): Secondary | ICD-10-CM | POA: Insufficient documentation

## 2024-06-19 NOTE — Telephone Encounter (Signed)
 Pharmacy contacted for follow-up of drug transition and dosing for Zepbound  (tirzepatide ) FROM Ozempic  (semaglutide )   Discussed conversation with patient previous week.  Patient has taken Wegovy  (semaglutide ) at 2.4mg  dose in the past without GI side effects and patient preference was to transition to a moderate dose and agreed with plan last week to select 10mg  as dose.   - Communicated this plan to proceed with Zepbound  (tirzepatide ) 10mg  weekly dose.   Total time with patient call and documentation of interaction: 6 minutes.
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
## 2024-06-19 NOTE — Telephone Encounter (Signed)
-----   Message from Otto Fairly sent at 06/19/2024  8:48 AM EDT ----- Regarding: FW: Verifying Zepbound  Dose  ----- Message ----- From: McDiarmid, Krystal BIRCH, MD Sent: 06/19/2024   8:45 AM EDT To: Otto ONEIDA Fairly, MD Subject: FW: Verifying Zepbound  Dose                     ----- Message ----- From: Shela Duwaine BIRCH, [facility] Sent: 06/16/2024   3:43 PM EDT To: Krystal BIRCH McDiarmid, MD Subject: Verifying Zepbound  Dose                        Hi Dr. McDiarmid-  I wanted to verify the Zepbound  dose we got for this patient. It looks like last month he got Wegovy  0.5 mg and the order we got for this month was Zepbound  10 mg. It seems like a big jump to me, especially going from Wegovy  to Zepbound . Is this the dose you want the patient to get? Thanks!  Megan D. Shela, PharmD, BCPS, CDCES Clinical Pharmacist [facility]  [phone]

## 2024-06-19 NOTE — Telephone Encounter (Signed)
 Reviewed and agree with Dr Rennis plan.

## 2024-06-20 ENCOUNTER — Other Ambulatory Visit (HOSPITAL_COMMUNITY): Payer: Self-pay

## 2024-06-20 ENCOUNTER — Encounter: Payer: Self-pay | Admitting: Hematology and Oncology

## 2024-06-21 ENCOUNTER — Other Ambulatory Visit: Payer: Self-pay | Admitting: *Deleted

## 2024-06-21 ENCOUNTER — Inpatient Hospital Stay

## 2024-06-21 ENCOUNTER — Inpatient Hospital Stay (HOSPITAL_BASED_OUTPATIENT_CLINIC_OR_DEPARTMENT_OTHER): Admitting: Hematology and Oncology

## 2024-06-21 ENCOUNTER — Other Ambulatory Visit (HOSPITAL_COMMUNITY): Payer: Self-pay

## 2024-06-21 ENCOUNTER — Encounter: Payer: Self-pay | Admitting: Licensed Clinical Social Worker

## 2024-06-21 VITALS — BP 156/86 | HR 81 | Temp 97.6°F | Resp 15 | Wt 284.8 lb

## 2024-06-21 DIAGNOSIS — K2971 Gastritis, unspecified, with bleeding: Secondary | ICD-10-CM | POA: Diagnosis not present

## 2024-06-21 DIAGNOSIS — D508 Other iron deficiency anemias: Secondary | ICD-10-CM | POA: Diagnosis not present

## 2024-06-21 DIAGNOSIS — D500 Iron deficiency anemia secondary to blood loss (chronic): Secondary | ICD-10-CM

## 2024-06-21 DIAGNOSIS — K2980 Duodenitis without bleeding: Secondary | ICD-10-CM | POA: Diagnosis not present

## 2024-06-21 DIAGNOSIS — R110 Nausea: Secondary | ICD-10-CM | POA: Diagnosis not present

## 2024-06-21 DIAGNOSIS — K648 Other hemorrhoids: Secondary | ICD-10-CM | POA: Diagnosis not present

## 2024-06-21 DIAGNOSIS — K2091 Esophagitis, unspecified with bleeding: Secondary | ICD-10-CM | POA: Diagnosis not present

## 2024-06-21 DIAGNOSIS — K449 Diaphragmatic hernia without obstruction or gangrene: Secondary | ICD-10-CM | POA: Diagnosis not present

## 2024-06-21 DIAGNOSIS — N281 Cyst of kidney, acquired: Secondary | ICD-10-CM | POA: Diagnosis not present

## 2024-06-21 LAB — CBC WITH DIFFERENTIAL (CANCER CENTER ONLY)
Abs Immature Granulocytes: 0.02 K/uL (ref 0.00–0.07)
Basophils Absolute: 0.1 K/uL (ref 0.0–0.1)
Basophils Relative: 1 %
Eosinophils Absolute: 0.3 K/uL (ref 0.0–0.5)
Eosinophils Relative: 5 %
HCT: 41.8 % (ref 39.0–52.0)
Hemoglobin: 13.4 g/dL (ref 13.0–17.0)
Immature Granulocytes: 0 %
Lymphocytes Relative: 13 %
Lymphs Abs: 0.9 K/uL (ref 0.7–4.0)
MCH: 24.9 pg — ABNORMAL LOW (ref 26.0–34.0)
MCHC: 32.1 g/dL (ref 30.0–36.0)
MCV: 77.6 fL — ABNORMAL LOW (ref 80.0–100.0)
Monocytes Absolute: 0.6 K/uL (ref 0.1–1.0)
Monocytes Relative: 9 %
Neutro Abs: 5.2 K/uL (ref 1.7–7.7)
Neutrophils Relative %: 72 %
Platelet Count: 345 K/uL (ref 150–400)
RBC: 5.39 MIL/uL (ref 4.22–5.81)
RDW: 18.7 % — ABNORMAL HIGH (ref 11.5–15.5)
WBC Count: 7.2 K/uL (ref 4.0–10.5)
nRBC: 0 % (ref 0.0–0.2)

## 2024-06-21 LAB — FERRITIN: Ferritin: 34 ng/mL (ref 24–336)

## 2024-06-21 LAB — IRON AND IRON BINDING CAPACITY (CC-WL,HP ONLY)
Iron: 32 ug/dL — ABNORMAL LOW (ref 45–182)
Saturation Ratios: 8 % — ABNORMAL LOW (ref 17.9–39.5)
TIBC: 388 ug/dL (ref 250–450)
UIBC: 356 ug/dL (ref 117–376)

## 2024-06-21 MED ORDER — TORSEMIDE 10 MG PO TABS
10.0000 mg | ORAL_TABLET | Freq: Every day | ORAL | 0 refills | Status: DC
  Filled 2024-06-21: qty 30, 30d supply, fill #0

## 2024-06-21 NOTE — Progress Notes (Signed)
 [facility] [phone]   [phone]  PROGRESS NOTE  Patient Care Team: Anders Otto DASEN, MD as PCP - General (Family Medicine)  Hematological/Oncological History  #Iron  deficiency anemia 2/2 to Presume GI Bleeding 1) 03/14/2021: TIBC 349, Iron  30 (L), Iron  saturation 9% (L), Ferritin 15 (L), Vitamin B12 537, Folate 7.4, WBC 8.6, Hgb 10.7 (L), MCV 74 (L), Plt 450.   2) 05/07/2021: Establish care with Irene Thayil PA-C  3) 05/26/2021-06/20/2021: Received IV venofer  200 mg once a week x 5 doses  4) 07/29/2021-08/26/2021: Received IV venofer  200 mg once a week x 5 doses  5) 10/13/2021: Received IV monoferric  1000 mg x 1 dose.   6) 10/28/2021: EGD showed esophagitis with bleeding, large haital hernia with multiple cameron ulcers, gastritis, duodenitis.Colonoscopy was incomplete due to poor prep. Evidence of non-bleeding internal hemorrhoids.   7) 11/18/2021: Received IV monoferric  1000 mg x 1 dose  8) 01/13/2022: Received IV monoferric  1000 mg x 1 dose  HISTORY OF PRESENTING ILLNESS:  [name] 58 y.o. male returns for follow-up for iron  deficiency anemia.  His last visit was on 04/27/2023.  In the interim since his last visit he   On exam today, [name] reports life is still quite hard.  He received his iron  infusions in September 2025 and reports they went well but he did not receive any benefit or side effects as result of this treatment.  He reports his energy today on a scale from 1-10 is -3.  He notes he is not having any lightheadedness or dizziness but does persistently have shortness of breath.  He reports any steps are tough, particularly up stairs.  He reports he is not having any overt signs of bleeding, bruising, or dark stools.  He does his best to try to eat red meats and spinach.  He notes that because he is currently living in his car he is not able to eat much in the way of iron  rich food.  He reports he is sleeping quite poorly and  is very exhausted.  Otherwise he has had no recent changes in his health.  He denies any weight loss, fevers, chills, sweats, nausea, vomiting or diarrhea.  A full 10 point ROS otherwise negative.  MEDICAL HISTORY:  Past Medical History:  Diagnosis Date   ADD (attention deficit disorder)    ADD (attention deficit disorder)    Allergy    Anemia, iron  deficiency    Anxiety    Asthma    Blood transfusion    Constipation    Depression    Depression    Exertional dyspnea    secondary to anemia   GERD (gastroesophageal reflux disease)    Hiatal hernia    Narcolepsy    pt also reports episodes of passing out that are unrelated to narcolepsy about a year ago   Sleep apnea 01/2018   has a CPAP, unable to use it    SURGICAL HISTORY: Past Surgical History:  Procedure Laterality Date   COLONOSCOPY     UPPER GASTROINTESTINAL ENDOSCOPY      SOCIAL HISTORY: Social History   Socioeconomic History   Marital status: Single    Spouse name: Not on file   Number of children: 0   Years of education: Not on file   Highest education level: Bachelor's degree (e.g., BA, AB, BS)  Occupational History   Occupation: student-Business  Tobacco Use   Smoking status: Never   Smokeless tobacco: Never  Vaping Use  Vaping status: Never Used  Substance and Sexual Activity   Alcohol use: No   Drug use: No   Sexual activity: Not Currently  Other Topics Concern   Not on file  Social History Narrative   Lives alone. Never married, no children.  Has two sister who live in [place].  Student at work study.  [employer].  Working toward [employer].  Previously worked in Geophysicist/field seismologist.  Was also previously homeless due to job loss and psychiatric exacerbation.   Social Drivers of Corporate investment banker Strain: High Risk (05/22/2024)   Overall Financial Resource Strain (CARDIA)    Difficulty of Paying Living Expenses: Very hard  Food Insecurity: Food Insecurity  Present (05/22/2024)   Hunger Vital Sign    Worried About Running Out of Food in the Last Year: Often true    Ran Out of Food in the Last Year: Often true  Transportation Needs: No Transportation Needs (05/22/2024)   PRAPARE - Administrator, Civil Service (Medical): No    Lack of Transportation (Non-Medical): No  Recent Concern: Transportation Needs - Unmet Transportation Needs (04/18/2024)   PRAPARE - Transportation    Lack of Transportation (Medical): No    Lack of Transportation (Non-Medical): Yes  Physical Activity: Inactive (04/18/2024)   Exercise Vital Sign    Days of Exercise per Week: 0 days    Minutes of Exercise per Session: Not on file  Stress: Stress Concern Present (04/18/2024)   [employer] of Occupational Health - Occupational Stress Questionnaire    Feeling of Stress: Very much  Social Connections: Socially Isolated (04/18/2024)   Social Connection and Isolation Panel    Frequency of Communication with Friends and Family: Once a week    Frequency of Social Gatherings with Friends and Family: Once a week    Attends Religious Services: More than 4 times per year    Active Member of [employer] or Organizations: No    Attends Engineer, structural: Not on file    Marital Status: Never married  Intimate Partner Violence: Not At Risk (03/31/2023)   Humiliation, Afraid, Rape, and Kick questionnaire    Fear of Current or Ex-Partner: No    Emotionally Abused: No    Physically Abused: No    Sexually Abused: No    FAMILY HISTORY: Family History  Problem Relation Age of Onset   Diabetes Paternal Grandfather    Cancer Neg Hx    Stroke Neg Hx    Breast cancer Neg Hx    Celiac disease Neg Hx    Cirrhosis Neg Hx    Clotting disorder Neg Hx    Colitis Neg Hx    Colon cancer Neg Hx    Colon polyps Neg Hx    Crohn's disease Neg Hx    Cystic fibrosis Neg Hx    Esophageal cancer Neg Hx    Heart disease Neg Hx    Hemochromatosis Neg Hx    Inflammatory  bowel disease Neg Hx    Irritable bowel syndrome Neg Hx    Kidney disease Neg Hx    Liver cancer Neg Hx    Liver disease Neg Hx    Ovarian cancer Neg Hx    Pancreatic cancer Neg Hx    Prostate cancer Neg Hx    Rectal cancer Neg Hx    Stomach cancer Neg Hx    Ulcerative colitis Neg Hx    Uterine cancer Neg Hx    Wilson's disease Neg  Hx     ALLERGIES:  has no known allergies.  MEDICATIONS:  Current Outpatient Medications  Medication Sig Dispense Refill   albuterol  (VENTOLIN  HFA) 108 (90 Base) MCG/ACT inhaler Inhale 1-2 puffs into the lungs every 6 (six) hours as needed for wheezing or shortness of breath. 18 g 2   buPROPion  (WELLBUTRIN  XL) 300 MG 24 hr tablet Take 1 tablet (300 mg total) by mouth daily. (Patient taking differently: Take 300 mg by mouth daily. Taking irregularly) 90 tablet 1   folic acid  (FOLVITE ) 800 MCG tablet Take 0.5 tablets (0.4 mg total) by mouth daily. 30 tablet PRN   methylphenidate  (RITALIN ) 20 MG tablet Take 1 tablet (20 mg total) by mouth in the morning and 1-2 tablets (20 mg - 40 mg total) in the afternoon. 90 tablet 0   methylphenidate  (RITALIN ) 20 MG tablet Take 1 tablet (20 mg total) by mouth in the morning, afternoon, and evening. 90 tablet 0   [START ON 06/23/2024] methylphenidate  (RITALIN ) 20 MG tablet Take one tablet in the morning, one tablet in the afternoon, and one tablet in the evening 90 tablet 0   methylphenidate  (RITALIN ) 20 MG tablet Take one tablet in the morning, one to two tablets in the afternoon 90 tablet 0   pantoprazole  (PROTONIX ) 40 MG tablet Take 1 tablet (40 mg total) by mouth daily. 90 tablet 1   tirzepatide  (ZEPBOUND ) 10 MG/0.5ML Pen Inject 10 mg into the skin once a week. 2 mL 1   torsemide  (DEMADEX ) 10 MG tablet Take 1 tablet (10 mg total) by mouth daily. 30 tablet 0   umeclidinium-vilanterol (ANORO ELLIPTA ) 62.5-25 MCG/ACT AEPB Inhale 1 puff into the lungs daily. 60 each 4   No current facility-administered medications for this  visit.    REVIEW OF SYSTEMS:   Constitutional: ( - ) fevers, ( - )  chills , ( - ) night sweats Eyes: ( - ) blurriness of vision, ( - ) double vision, ( - ) watery eyes Ears, nose, mouth, throat, and face: ( - ) mucositis, ( - ) sore throat Respiratory: ( - ) cough, ( + ) dyspnea, ( - ) wheezes Cardiovascular: ( - ) palpitation, ( - ) chest discomfort, ( - ) lower extremity swelling Gastrointestinal:  ( +) nausea, ( - ) heartburn, ( - ) change in bowel habits Skin: ( - ) abnormal skin rashes Lymphatics: ( - ) new lymphadenopathy, ( - ) easy bruising Neurological: ( - ) numbness, ( - ) tingling, ( - ) new weaknesses Behavioral/Psych: ( - ) mood change, ( - ) new changes  All other systems were reviewed with the patient and are negative.  PHYSICAL EXAMINATION: ECOG PERFORMANCE STATUS: 2 - Symptomatic, <50% confined to bed  Vitals:   06/21/24 1413  BP: (!) 156/86  Pulse: 81  Resp: 15  Temp: 97.6 F (36.4 C)  SpO2: 99%      Filed Weights   06/21/24 1413  Weight: 284 lb 12.8 oz (129.2 kg)    GENERAL: well appearing male in NAD, obese SKIN: skin color, texture, turgor are normal, no rashes or significant lesions EYES: conjunctiva are pink and non-injected, sclera clear LUNGS: clear to auscultation and percussion with normal breathing effort HEART: Regular rhythm and rate. No murmurs and no lower extremity edema. Musculoskeletal: no cyanosis of digits and no clubbing  PSYCH: alert & oriented x 3, fluent speech NEURO: no focal motor/sensory deficits  LABORATORY DATA:  I have reviewed the data as listed  Latest Ref Rng & Units 06/21/2024    1:39 PM 04/27/2024    9:11 AM 01/17/2024    1:25 PM  CBC  WBC 4.0 - 10.5 K/uL 7.2  8.1  10.4   Hemoglobin 13.0 - 17.0 g/dL 86.5  87.7  86.0   Hematocrit 39.0 - 52.0 % 41.8  39.9  42.6   Platelets 150 - 400 K/uL 345  409  346        Latest Ref Rng & Units 04/27/2024    9:11 AM 01/17/2024    1:25 PM 10/20/2023   11:37 AM  CMP   Glucose 70 - 99 mg/dL 892  96  90   BUN 6 - 24 mg/dL 17  25  16    Creatinine 0.76 - 1.27 mg/dL 8.87  8.35  8.74   Sodium 134 - 144 mmol/L 138  139  136   Potassium 3.5 - 5.2 mmol/L 4.5  4.3  4.7   Chloride 96 - 106 mmol/L 101  105  106   CO2 20 - 29 mmol/L 21  21  24    Calcium 8.7 - 10.2 mg/dL 9.4  9.8  9.6   Total Protein 6.0 - 8.5 g/dL 6.8  7.3  7.7   Total Bilirubin 0.0 - 1.2 mg/dL 0.2  0.2  0.3   Alkaline Phos 44 - 121 IU/L 108  99  112   AST 0 - 40 IU/L 16  22  14    ALT 0 - 44 IU/L 14  19  11      ASSESSMENT & PLAN DEMETRIO LEIGHTY is a 58 y.o. male returns for follow-up for iron  deficiency anemia.  #Iron  deficiency anemia 2/2 GI bleeding: --Established care with Dr. Estefana Kidney at [facility]. Underwent EGD/colonoscopy on 10/28/2021 with findings that showed esophagitis with bleeding, large haital hernia with multiple cameron ulcers, gastritis, duodenitis. Colonoscopy was incomplete due to poor prep.  --Patient is unable to tolerate oral iron  supplementation due to constipation.   --Most recently received IV monoferric  1000 mg x 1 dose on 01/13/2022 --Labs today showed WBC 7.2, Hgb 13.4, MCV 77.6, Plt 345.  Ferritin 34 --Recommend repeat IV iron  if iron  levels are persistently low. -- Patient last received IV Feraheme on 05/11/2024 to 05/18/2024.   #Renal Cystic lesions: --Seen on renal US  from 11/10/2021 with recommendations to repeat to monitor --Repeat renal US  on 04/28/2023. No further imaging recommended per radiology.   #Nausea:  --Mainly triggered with liquids.  --Etiology unknown but will request follow up with GI to further evaluate   Follow up: 45-month follow-up  No orders of the defined types were placed in this encounter.  All questions were answered. The patient knows to call the clinic with any problems, questions or concerns.  I have spent a total of 30 minutes minutes of face-to-face and non-face-to-face time, preparing to see the patient, performing a medically  appropriate examination, counseling and educating the patient, ordering medications/tests, communicating with other health care professionals, documenting clinical information in the electronic health record, and care coordination.    Norleen IVAR Kidney, MD Department of Hematology/Oncology [facility] Phone: [phone] Pager: [phone] Email: [email or web address]

## 2024-06-22 ENCOUNTER — Inpatient Hospital Stay

## 2024-06-22 NOTE — Progress Notes (Signed)
 [facility] Clinical Social Work  Clinical Social Work was referred by Engineer, civil (consulting) for [employer] needs.  Clinical Social Worker contacted patient by phone to offer support and assess for needs. Patient is unhoused currently residing his vehicle. Patient reported challenges with obtaining employment due to his significant fatigued from his anemia, stating he is unable to walk up a flight of steps. Patient reported no income, has SNAP benefits, and Medicaid. No reported social network to assist with concerns. Patient previously was referred to servant center [place] program by former [facility] CSW. However, patient was unable to follow up with the organization for an appeal. Patient previously connected to several social workers within the [employer], but patient's limited income has impacted his eligibility to programs. Primary need is assistance for gas.    Interventions: CSW completed brief assessment. CSW sent referral to community resources: [facility].  CSW explored if colleges know of any transportation benefit (gas), awaiting guidance.  CSW sent inquiry to [facility]. Patient utilized funds in 2023. CSW inquired if he would be eligible for any assistance, awaiting guidance.     Follow Up Plan:  CSW will follow-up with patient by phone  once guidance is learned. Direct contact below if needed.    Lizbeth Sprague, LCSW  Clinical Social Worker [facility] [phone]

## 2024-06-23 ENCOUNTER — Telehealth: Payer: Self-pay

## 2024-06-23 ENCOUNTER — Encounter: Payer: Self-pay | Admitting: Hematology and Oncology

## 2024-06-23 NOTE — Telephone Encounter (Signed)
 Received secure staff message from patient's hematologist Dr. Federico & [name], PA requesting patient be scheduled for an OV and colonoscopy for IDA, fatigue in 1-2 months. Patient is homeless & will require a hotel in order to do bowel prep. Alan has been in touch with an organization in [place] that will be able to accommodate him for a week stay in a hotel for colonoscopy. She has also reached out to a contact to help set up more permanent housing as well. I've left a message for patient to call back.

## 2024-06-26 ENCOUNTER — Other Ambulatory Visit (HOSPITAL_COMMUNITY): Payer: Self-pay

## 2024-06-27 ENCOUNTER — Other Ambulatory Visit: Payer: Self-pay | Admitting: Hematology and Oncology

## 2024-06-28 ENCOUNTER — Inpatient Hospital Stay

## 2024-06-28 ENCOUNTER — Telehealth: Payer: Self-pay | Admitting: Pharmacy Technician

## 2024-06-28 ENCOUNTER — Other Ambulatory Visit (HOSPITAL_COMMUNITY): Payer: Self-pay | Admitting: Hematology and Oncology

## 2024-06-28 NOTE — Progress Notes (Signed)
 [facility] CSW Progress Note  Clinical Child psychotherapist contacted patient by phone to follow-up on need for community resources. Patient's has a documented history with experiencing SDOH barriers. Patient has worked with a few Social Workers over the past two years to assist with reported barriers. Patient's reported concerns are financial strain and housing.   SDOH Interventions    Flowsheet Row Clinical Support from 06/28/2024 in [facility] - A Dept Of [place]. [facility] Patient Outreach Telephone from 05/22/2024 in [place] POPULATION HEALTH DEPARTMENT Patient Outreach Telephone from 10/04/2023 in [place] POPULATION HEALTH DEPARTMENT Patient Outreach Telephone from 09/15/2023 in [place] POPULATION HEALTH DEPARTMENT Chronic Care Management from 02/13/2022 in [facility] - A Dept Of [place]. [name] Office Visit from 12/19/2021 in [facility] - A Dept Of [place]. [facility]  SDOH Interventions        Food Insecurity Interventions Intervention Not Indicated, [facility] Food [employer] Resources Provided -- -- Other (Comment)  [pt already connected with [facility],  is not interested in other resources] --  Housing Interventions Intervention Not Indicated Community Resources Provided -- [employer] Provided Patient Refused  [pt is not interested in a shelter] --  Depression Interventions/Treatment  -- -- -- Currently on Treatment -- Referral to Psychiatry  Financial Strain Interventions -- [employer] Provided -- -- -- --  Stress Interventions -- -- [employer] Provided, Education officer, environmental Provided -- --    Interventions:  CSW sent referral to community resources: [employer] SOAR Program: [employer] Staff member called CSW on 10/21 to confirm referral was received. Patient encouraged to contact CSW if he does not hear back from [facility] regarding referral.   CSW explored if colleges  know of any transportation benefit (gas): No additional resources found for assistance with gas.   CSW sent inquiry to [facility]. Patient utilized funds in 2023. CSW inquired if he would be eligible for any assistance, awaiting guidance: [name] does not assist with transportation. CSW awaiting guidance to see if patient has any remaining funds to utilize for other expenses.   CSW referred patient to [facility]. Although patient does not currently have income, being enrolled in the coordinated entry program would be a proactive task.   CSW referred patient to his medicaid provider to ensure no additional assistance is available.  Jeffery      Follow Up Plan:  CSW has explored all known resources to assist with SDOH barriers. Patient encouraged to keep CSW updated with status of SOAR referral.     Lizbeth Sprague, LCSW Clinical Social Worker [facility]

## 2024-06-28 NOTE — Telephone Encounter (Signed)
 Auth Submission: NO AUTH NEEDED Site of care: Site of care: [facility] Payer: [employer] [place] [employer]ICA[employer] Medication & CPT/J Code(s) submitted: MONOFERRIC  J1437 Diagnosis Code: D50.9 Route of submission (phone, fax, portal):  Phone # Fax # Auth type: Buy/Bill HB Units/visits requested: X1 DOSE Reference number:  Approval from: 06/28/24 to 09/06/24

## 2024-06-29 NOTE — Telephone Encounter (Signed)
 Left message for patient to call back

## 2024-06-30 NOTE — Telephone Encounter (Signed)
 Scheduled colon with patient for 12/5 at 7:00 am with Dr. Suzann in the [facility]. PV scheduled for 11/17 at 8:30 am by phone (will discuss if follow up is needed with Alan, given patient is homeless and transportation may be difficult). He's aware our office will be in touch further once we've worked out arrangements with [employer].

## 2024-06-30 NOTE — Telephone Encounter (Signed)
 I initially reached out to Coordinated entry system navigators for partners ending homelessness however they deal with more long-term housing.  I talked with Mrs Vera Idol who directed me to workforce Reform liaison Project also noticed [employer].  But in discussion with a Ms. LaTosha Lovette who should be able to secure a hotel for the patient for the night if not for a week. Her contact information is [phone] [email or web address]  The company works with 3 different hotels in the area and one of them is [employer] which is 8 minutes from our office.  Will try to secure a hotel room at that hotel night before colonoscopy

## 2024-06-30 NOTE — Telephone Encounter (Signed)
 Called and spoke with [name] & she asked that I email patient's name only. They will set up hotel reservation & send us  an email once confirmed, then we will need to notify patient of arrangements. Email sent.

## 2024-07-03 NOTE — Progress Notes (Signed)
 [facility] CSW Progress Note  Clinical Social Worker sent message to [employer] to follow up on request for [employer] remaining balance amount.   Interventions: CSW assisted with community referral.       Follow Up Plan:  CSW will follow up by phone once status is learned.    Jeffery Sprague, LCSW Clinical Social Worker [facility]

## 2024-07-11 ENCOUNTER — Ambulatory Visit (HOSPITAL_COMMUNITY)
Admission: RE | Admit: 2024-07-11 | Discharge: 2024-07-11 | Disposition: A | Discharge status: Home / Self Care | Source: Ambulatory Visit | Attending: Hematology and Oncology | Admitting: Hematology and Oncology

## 2024-07-11 VITALS — BP 159/104 | HR 94 | Temp 97.8°F

## 2024-07-11 DIAGNOSIS — D508 Other iron deficiency anemias: Secondary | ICD-10-CM | POA: Diagnosis present

## 2024-07-11 MED ORDER — SODIUM CHLORIDE 0.9 % IV SOLN
1000.0000 mg | Freq: Once | INTRAVENOUS | Status: AC
  Administered 2024-07-11: 1000 mg via INTRAVENOUS
  Filled 2024-07-11: qty 10

## 2024-07-11 NOTE — Progress Notes (Addendum)
 Pt here and received one time dose of Monoferric .  He chose not to stay for recommended 30 minute observation post infusion and left at completion of infusion.  VItal signs stable at discharge, pt with no complaints

## 2024-07-12 ENCOUNTER — Encounter (HOSPITAL_COMMUNITY): Payer: Self-pay

## 2024-07-12 ENCOUNTER — Other Ambulatory Visit (HOSPITAL_COMMUNITY): Payer: Self-pay

## 2024-07-12 ENCOUNTER — Other Ambulatory Visit: Payer: Self-pay | Admitting: Family Medicine

## 2024-07-12 ENCOUNTER — Other Ambulatory Visit: Payer: Self-pay

## 2024-07-12 MED ORDER — TIRZEPATIDE-WEIGHT MANAGEMENT 12.5 MG/0.5ML ~~LOC~~ SOAJ
12.5000 mg | SUBCUTANEOUS | 1 refills | Status: DC
  Filled 2024-07-12: qty 2, 28d supply, fill #0
  Filled 2024-08-10 (×2): qty 2, 28d supply, fill #1
  Filled ????-??-?? (×2): fill #0

## 2024-07-17 ENCOUNTER — Other Ambulatory Visit: Payer: Self-pay | Admitting: Hematology and Oncology

## 2024-07-17 ENCOUNTER — Other Ambulatory Visit (HOSPITAL_COMMUNITY): Payer: Self-pay

## 2024-07-17 MED ORDER — TORSEMIDE 10 MG PO TABS
10.0000 mg | ORAL_TABLET | Freq: Every day | ORAL | 0 refills | Status: AC
  Filled 2024-07-17 – 2024-08-10 (×2): qty 30, 30d supply, fill #0

## 2024-07-21 ENCOUNTER — Other Ambulatory Visit (HOSPITAL_COMMUNITY): Payer: Self-pay

## 2024-07-21 ENCOUNTER — Inpatient Hospital Stay: Attending: Hematology and Oncology

## 2024-07-21 MED ORDER — METHYLPHENIDATE HCL 20 MG PO TABS: ORAL_TABLET | ORAL | 0 refills | Status: AC

## 2024-07-21 MED ORDER — METHYLPHENIDATE HCL 20 MG PO TABS
20.0000 mg | ORAL_TABLET | Freq: Three times a day (TID) | ORAL | 0 refills | Status: AC
  Filled 2024-09-14: qty 90, 30d supply, fill #0

## 2024-07-21 MED ORDER — METHYLPHENIDATE HCL 20 MG PO TABS: 20.0000 mg | ORAL_TABLET | Freq: Two times a day (BID) | ORAL | 0 refills | Status: AC

## 2024-07-21 NOTE — Progress Notes (Signed)
 [facility] CSW Progress Note  Patient contacted CSW on this date to discuss [facility] Referral and Questions about [employer] account. CSW contacted [employer] on his behald as requested and was informed patient needed to provide proof of insurance coverage at time of service. CSW relayed message to patient. CSW sent message to servant center team asking for guidance.    Lizbeth Sprague, LCSW Clinical Social Worker [facility]

## 2024-07-24 ENCOUNTER — Telehealth: Payer: Self-pay | Admitting: *Deleted

## 2024-07-24 ENCOUNTER — Ambulatory Visit: Admitting: *Deleted

## 2024-07-24 ENCOUNTER — Other Ambulatory Visit (HOSPITAL_COMMUNITY): Payer: Self-pay

## 2024-07-24 VITALS — Ht 71.0 in | Wt 287.0 lb

## 2024-07-24 DIAGNOSIS — O26819 Pregnancy related exhaustion and fatigue, unspecified trimester: Secondary | ICD-10-CM

## 2024-07-24 DIAGNOSIS — D509 Iron deficiency anemia, unspecified: Secondary | ICD-10-CM

## 2024-07-24 DIAGNOSIS — R112 Nausea with vomiting, unspecified: Secondary | ICD-10-CM

## 2024-07-24 MED ORDER — NA SULFATE-K SULFATE-MG SULF 17.5-3.13-1.6 GM/177ML PO SOLN
1.0000 | Freq: Once | ORAL | 0 refills | Status: AC
  Filled 2024-07-24 – 2024-08-10 (×3): qty 354, 1d supply, fill #0

## 2024-07-24 NOTE — Progress Notes (Signed)
 Pt's name and DOB verified at the beginning of the pre-visit with 2 identifiers  Pt denies any difficulty with ambulating,sitting, laying down or rolling side to side  Pt has no issues moving head neck or swallowing  No egg or soy allergy known to patient   No issues known to pt with past sedation  No FH of Malignant Hyperthermia  Pt is not on home 02   Pt is not on blood thinners   Pt has frequent issues with constipation RN instructed pt to use Miralax per bottles instructions a week before prep days. Pt states they will  Pt is not on dialysis  Pt denise any abnormal heart rhythms   Pt denies any upcoming cardiac testing  Patient's chart reviewed by Norleen Schillings CNRA prior to pre-visit and patient appropriate for the [facility].  Pre-visit completed and red dot placed by patient's name on their procedure day (on provider's schedule).    Visit by phone  Pt states weight is 287 lb  Pt given  both [facility] main # and MD on call # prior to instructions.  Informed pt to come in at the time discussed and is shown on PV instructions.  Pt instructed to use Singlecare.com or GoodRx for a price reduction on prep  Instructed pt where to find PV instructions in My Ch. Copy of instructions  to be sent in mail and address read back to pt to verify correct on envelope. Instructed pt on all aspects of written instructions including med holds clothing to wear and foods to eat and not eat as well as after procedure legal restrictions and to call MD on call if needed.. Pt states understanding. Instructed pt to review instructions again prior to procedure and call main # given if has any questions or any issues. Pt states they will.

## 2024-07-24 NOTE — Telephone Encounter (Signed)
 PV appt was completed this morning as scheduled.

## 2024-07-24 NOTE — Addendum Note (Signed)
 Addended by: MALINDA ORIE CROME on: 07/24/2024 09:55 AM   Modules accepted: Orders

## 2024-07-24 NOTE — Telephone Encounter (Signed)
 Attempt to reach pt for pre-visit. LM with call back #.  Will attempt to reach again in 5 min due to no other # listed in profile  Second attempt to reach pt for pre-vist reached pt

## 2024-07-27 ENCOUNTER — Other Ambulatory Visit (HOSPITAL_COMMUNITY): Payer: Self-pay

## 2024-07-28 ENCOUNTER — Other Ambulatory Visit (HOSPITAL_COMMUNITY): Payer: Self-pay

## 2024-07-31 NOTE — Progress Notes (Signed)
 [facility] CSW Progress Note  Clinical Child Psychotherapist has been working to assist with SDOH needs. CSW attempting to assist patient with applying for SSI/SSDI. CSW attempting to contact patient with the [employer] companies.   Interventions: Clinical Social Worker attempted to reach patient by telephone to confirm if contact has been made by servant center and to discuss [employer]. CSW unable to reach patient. Unable to leave voicemail. CSW attempting to locate contact for [facility] DAP program to confirm if attempt has been made.   Follow Up Plan:  Patient will contact CSW with any support or resource needs and CSW will follow-up with patient by phone     Lizbeth Sprague, LCSW Clinical Social Worker [facility]

## 2024-08-02 ENCOUNTER — Telehealth: Payer: Self-pay

## 2024-08-02 NOTE — Telephone Encounter (Signed)
 [facility] CSW Progress Note  Clinical Social Worker attempting to reach patient x2 to discuss servant center referral and the american [employer] companies. CSW unable to reach patient. Unable to leave voicemail.   Follow Up Plan:  CSW will make one more attempt to reach patient.    Lizbeth Sprague, LCSW Clinical Social Worker [facility]

## 2024-08-04 ENCOUNTER — Other Ambulatory Visit (HOSPITAL_COMMUNITY): Payer: Self-pay

## 2024-08-09 ENCOUNTER — Telehealth: Payer: Self-pay

## 2024-08-09 NOTE — Telephone Encounter (Signed)
-----   Message from Inocente CHRISTELLA Hausen sent at 08/09/2024  2:55 PM EST ----- Regarding: Adding EGD to colonoscopy on 08/11/2024 Hi [facility] -  [name] is a patient of Dr. Lafonda who I am scheduled to do a colonoscopy for Friday afternoon 08/11/2024  In reviewing Dr. 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  Chart review indicates that the patient has a challenging social situation that has impacted care.  Last telephone note from [name] indicates they were trying to secure a hotel room for him to stay in before his procedure.  Because he is overdue for EGD and there are social barriers to him coming in for procedures, can you please inquire with the [facility] if they would be okay with me adding on an EGD to his colonoscopy that day?  I currently have 12 cases booked so there is technically room that day to add a case to my schedule.  Thanks,  Inocente

## 2024-08-09 NOTE — Telephone Encounter (Signed)
 Called & spoke with patient. He is OK with adding EGD to current appt on Friday, 08/11/24. Patient is unable to move to earlier appt since he has already secured afternoon transportation.   Called [facility] and spoke with Ambrose. I was advised to reach out to last case of the day to see if they would be able to come in that morning instead. A call has been placed. Awaiting confirmation before adding EGD.

## 2024-08-09 NOTE — Progress Notes (Deleted)
 [facility] History and Physical   Primary Care Physician:  Anders Otto DASEN, MD   Reason for Procedure:  Iron  deficiency anemia, history of colon polyp,  Plan:    Colonoscopy   The patient was provided an opportunity to ask questions and all were answered. The patient agreed with the plan.   HPI: [name] is a 58 y.o. male undergoing colonoscopy to follow-up a history of iron  deficiency anemia and colon polyp.   Patient has had 2 colonoscopies:  Colonoscopy 2013-fair bowel prep, 10 mm polyp -path report not available; 3-year follow-up colonoscopy advised Colonoscopy 2023 aborted due to poor prep  No family history of colorectal cancer or polyps  Patient is reported to have a history of constipation   Past Medical History:  Diagnosis Date   ADD (attention deficit disorder)    ADD (attention deficit disorder)    Anemia, iron  deficiency    Anxiety    Asthma    Blood transfusion    Blood transfusion without reported diagnosis    Constipation    Depression    Depression    Exertional dyspnea    secondary to anemia   GERD (gastroesophageal reflux disease)    Narcolepsy    pt also reports episodes of passing out that are unrelated to narcolepsy about a year ago   Sleep apnea 01/2018   has a CPAP, unable to use it    Past Surgical History:  Procedure Laterality Date   COLONOSCOPY     UPPER GASTROINTESTINAL ENDOSCOPY      Prior to Admission medications   Medication Sig Start Date End Date Taking? Authorizing Provider  albuterol  (VENTOLIN  HFA) 108 (90 Base) MCG/ACT inhaler Inhale 1-2 puffs into the lungs every 6 (six) hours as needed for wheezing or shortness of breath. 02/03/24   Anders Otto DASEN, MD  buPROPion  (WELLBUTRIN  XL) 300 MG 24 hr tablet Take 1 tablet (300 mg total) by mouth daily. Patient not taking: Reported on 07/24/2024 11/29/23   Anders Otto DASEN, MD  folic acid  (FOLVITE ) 800 MCG tablet Take 0.5 tablets (0.4 mg total) by mouth  daily. Patient not taking: Reported on 07/24/2024 04/28/24   Anders Otto DASEN, MD  methylphenidate  (RITALIN ) 20 MG tablet Take 1 tablet (20 mg total) by mouth in the morning and 1-2 tablets (20 mg - 40 mg total) in the afternoon. 03/24/24     methylphenidate  (RITALIN ) 20 MG tablet Take 1 tablet (20 mg total) by mouth in the morning, afternoon, and evening. Patient not taking: Reported on 07/24/2024 05/24/24     methylphenidate  (RITALIN ) 20 MG tablet Take 1 tablet (20 mg total) by mouth in the morning, in the afternoon, and in the evening. Patient not taking: Reported on 07/24/2024 06/23/24     methylphenidate  (RITALIN ) 20 MG tablet Take one tablet in the morning, one to two tablets in the afternoon Patient not taking: Reported on 07/24/2024 05/24/24     methylphenidate  (RITALIN ) 20 MG tablet Take 1 tablet (20 mg) by mouth in the morning, one tablet in the afternoon, and one tablet in the evening Patient not taking: Reported on 07/24/2024 08/26/24     methylphenidate  (RITALIN ) 20 MG tablet Take 1 tablet (20 mg total) by mouth every morning AND 1-2 tablets (20-40 mg total) daily in the afternoon. Patient not taking: No sig reported 07/21/24     methylphenidate  (RITALIN ) 20 MG tablet Take 1 tablet (20 mg total) by mouth in the morning and 1-2 tablets in the afternoon Patient  not taking: Reported on 07/24/2024 09/26/24     OVER THE COUNTER MEDICATION daily at 12 noon. Carnivore supplement    [provider]  OVER THE COUNTER MEDICATION daily at 12 noon. Testerone supplement Up Red    [provider]  OVER THE COUNTER MEDICATION daily at 12 noon. Tumeric and pepper    [provider]  pantoprazole  (PROTONIX ) 40 MG tablet Take 1 tablet (40 mg total) by mouth daily. 02/14/24   Anders Otto DASEN, MD  tirzepatide  (ZEPBOUND ) 12.5 MG/0.5ML Pen Inject 12.5 mg into the skin once a week. 07/12/24   Anders Otto DASEN, MD  torsemide  (DEMADEX ) 10 MG tablet Take 1 tablet (10 mg total) by mouth  daily. Patient taking differently: Take 10 mg by mouth as needed. 07/17/24 08/16/24  Federico Norleen DASEN MADISON, MD  umeclidinium-vilanterol (ANORO ELLIPTA ) 62.5-25 MCG/ACT AEPB Inhale 1 puff into the lungs daily. 03/02/24   Anders Otto DASEN, MD    Current Outpatient Medications  Medication Sig Dispense Refill   albuterol  (VENTOLIN  HFA) 108 (90 Base) MCG/ACT inhaler Inhale 1-2 puffs into the lungs every 6 (six) hours as needed for wheezing or shortness of breath. 18 g 2   buPROPion  (WELLBUTRIN  XL) 300 MG 24 hr tablet Take 1 tablet (300 mg total) by mouth daily. (Patient not taking: Reported on 07/24/2024) 90 tablet 1   folic acid  (FOLVITE ) 800 MCG tablet Take 0.5 tablets (0.4 mg total) by mouth daily. (Patient not taking: Reported on 07/24/2024) 30 tablet PRN   methylphenidate  (RITALIN ) 20 MG tablet Take 1 tablet (20 mg total) by mouth in the morning and 1-2 tablets (20 mg - 40 mg total) in the afternoon. 90 tablet 0   methylphenidate  (RITALIN ) 20 MG tablet Take 1 tablet (20 mg total) by mouth in the morning, afternoon, and evening. (Patient not taking: Reported on 07/24/2024) 90 tablet 0   methylphenidate  (RITALIN ) 20 MG tablet Take 1 tablet (20 mg total) by mouth in the morning, in the afternoon, and in the evening. (Patient not taking: Reported on 07/24/2024) 90 tablet 0   methylphenidate  (RITALIN ) 20 MG tablet Take one tablet in the morning, one to two tablets in the afternoon (Patient not taking: Reported on 07/24/2024) 90 tablet 0   [START ON 08/26/2024] methylphenidate  (RITALIN ) 20 MG tablet Take 1 tablet (20 mg) by mouth in the morning, one tablet in the afternoon, and one tablet in the evening (Patient not taking: Reported on 07/24/2024) 90 tablet 0   methylphenidate  (RITALIN ) 20 MG tablet Take 1 tablet (20 mg total) by mouth every morning AND 1-2 tablets (20-40 mg total) daily in the afternoon. (Patient not taking: No sig reported) 90 tablet 0   [START ON 09/26/2024] methylphenidate  (RITALIN ) 20 MG  tablet Take 1 tablet (20 mg total) by mouth in the morning and 1-2 tablets in the afternoon (Patient not taking: Reported on 07/24/2024) 90 tablet 0   OVER THE COUNTER MEDICATION daily at 12 noon. Carnivore supplement     OVER THE COUNTER MEDICATION daily at 12 noon. Testerone supplement Up Red     OVER THE COUNTER MEDICATION daily at 12 noon. Tumeric and pepper     pantoprazole  (PROTONIX ) 40 MG tablet Take 1 tablet (40 mg total) by mouth daily. 90 tablet 1   tirzepatide  (ZEPBOUND ) 12.5 MG/0.5ML Pen Inject 12.5 mg into the skin once a week. 2 mL 1   torsemide  (DEMADEX ) 10 MG tablet Take 1 tablet (10 mg total) by mouth daily. (Patient taking differently: Take  10 mg by mouth as needed.) 30 tablet 0   umeclidinium-vilanterol (ANORO ELLIPTA ) 62.5-25 MCG/ACT AEPB Inhale 1 puff into the lungs daily. 60 each 4   No current facility-administered medications for this visit.    Allergies as of 08/11/2024   (No Known Allergies)    Family History  Problem Relation Age of Onset   Diabetes Paternal Grandfather    Cancer Neg Hx    Stroke Neg Hx    Breast cancer Neg Hx    Celiac disease Neg Hx    Cirrhosis Neg Hx    Clotting disorder Neg Hx    Colitis Neg Hx    Colon cancer Neg Hx    Colon polyps Neg Hx    Crohn's disease Neg Hx    Cystic fibrosis Neg Hx    Esophageal cancer Neg Hx    Heart disease Neg Hx    Hemochromatosis Neg Hx    Inflammatory bowel disease Neg Hx    Irritable bowel syndrome Neg Hx    Kidney disease Neg Hx    Liver cancer Neg Hx    Liver disease Neg Hx    Ovarian cancer Neg Hx    Pancreatic cancer Neg Hx    Prostate cancer Neg Hx    Rectal cancer Neg Hx    Stomach cancer Neg Hx    Ulcerative colitis Neg Hx    Uterine cancer Neg Hx    Wilson's disease Neg Hx     Social History   Socioeconomic History   Marital status: Single    Spouse name: Not on file   Number of children: 0   Years of education: Not on file   Highest education level: Bachelor's degree  (e.g., BA, AB, BS)  Occupational History   Occupation: student-Business  Tobacco Use   Smoking status: Never   Smokeless tobacco: Never  Vaping Use   Vaping status: Never Used  Substance and Sexual Activity   Alcohol use: No   Drug use: No   Sexual activity: Not Currently  Other Topics Concern   Not on file  Social History Narrative   Lives alone. Never married, no children.  Has two sister who live in [place].  Student at work study.  [employer].  Working toward [employer].  Previously worked in geophysicist/field seismologist.  Was also previously homeless due to job loss and psychiatric exacerbation.   Social Drivers of Corporate Investment Banker Strain: High Risk (06/28/2024)   Overall Financial Resource Strain (CARDIA)    Difficulty of Paying Living Expenses: Very hard  Food Insecurity: Food Insecurity Present (06/28/2024)   Hunger Vital Sign    Worried About Running Out of Food in the Last Year: Often true    Ran Out of Food in the Last Year: Often true  Transportation Needs: No Transportation Needs (06/28/2024)   PRAPARE - Administrator, Civil Service (Medical): No    Lack of Transportation (Non-Medical): No  Recent Concern: Transportation Needs - Unmet Transportation Needs (04/18/2024)   PRAPARE - Transportation    Lack of Transportation (Medical): No    Lack of Transportation (Non-Medical): Yes  Physical Activity: Inactive (04/18/2024)   Exercise Vital Sign    Days of Exercise per Week: 0 days    Minutes of Exercise per Session: Not on file  Stress: Stress Concern Present (06/28/2024)   [employer] of Occupational Health - Occupational Stress Questionnaire    Feeling of Stress: Very much  Social Connections: Socially  Isolated (04/18/2024)   Social Connection and Isolation Panel    Frequency of Communication with Friends and Family: Once a week    Frequency of Social Gatherings with Friends and Family: Once a week    Attends Religious  Services: More than 4 times per year    Active Member of [employer] or Organizations: No    Attends Engineer, Structural: Not on file    Marital Status: Never married  Intimate Partner Violence: Not At Risk (03/31/2023)   Humiliation, Afraid, Rape, and Kick questionnaire    Fear of Current or Ex-Partner: No    Emotionally Abused: No    Physically Abused: No    Sexually Abused: No    Review of Systems:  All other review of systems negative except as mentioned in the HPI.  Physical Exam: Vital signs There were no vitals taken for this visit.  General:   Alert,  Well-developed, well-nourished, pleasant and cooperative in NAD Airway:  Mallampati  Lungs:  Clear throughout to auscultation.   Heart:  Regular rate and rhythm; no murmurs, clicks, rubs,  or gallops. Abdomen:  Soft, nontender and nondistended. Normal bowel sounds.   Neuro/Psych:  Normal mood and affect. A and O x 3  Inocente Hausen, MD [facility]

## 2024-08-10 ENCOUNTER — Other Ambulatory Visit (HOSPITAL_COMMUNITY): Payer: Self-pay

## 2024-08-10 ENCOUNTER — Encounter: Payer: Self-pay | Admitting: Family Medicine

## 2024-08-10 NOTE — Progress Notes (Deleted)
 [facility] History and Physical   Primary Care Physician:  Anders Otto DASEN, MD   Reason for Procedure:  LA grade C esophagitis, iron  deficiency anemia, history of colon polyp,  Plan:    Upper endoscopy and colonoscopy   The patient was provided an opportunity to ask questions and all were answered. The patient agreed with the plan.   HPI: [name] is a 58 y.o. male undergoing upper endoscopy and colonoscopy to follow-up a history of iron  deficiency anemia, LA grade C esophagitis and colon polyp.   EGD 2023 disclosed LA grade C esophagitis, large hiatal hernia with multiple Cameron's erosions, gastritis and duodenitis.  Biopsies showed acute ulcerative esophagitis, intestinal metaplasia the GE junction and overall normal gastric and duodenal biopsies.  Medication list includes pantoprazole   Patient has had 2 colonoscopies:  Colonoscopy 2013-fair bowel prep, 10 mm polyp -path report not available; 3-year follow-up colonoscopy advised Colonoscopy 2023 aborted due to poor prep  No family history of colorectal cancer or polyps  Patient is reported to have a history of constipation   Past Medical History:  Diagnosis Date   ADD (attention deficit disorder)    ADD (attention deficit disorder)    Anemia, iron  deficiency    Anxiety    Asthma    Blood transfusion    Blood transfusion without reported diagnosis    Constipation    Depression    Depression    Exertional dyspnea    secondary to anemia   GERD (gastroesophageal reflux disease)    Narcolepsy    pt also reports episodes of passing out that are unrelated to narcolepsy about a year ago   Sleep apnea 01/2018   has a CPAP, unable to use it    Past Surgical History:  Procedure Laterality Date   COLONOSCOPY     UPPER GASTROINTESTINAL ENDOSCOPY      Prior to Admission medications   Medication Sig Start Date End Date Taking? Authorizing Provider  albuterol  (VENTOLIN  HFA) 108 (90 Base) MCG/ACT  inhaler Inhale 1-2 puffs into the lungs every 6 (six) hours as needed for wheezing or shortness of breath. 02/03/24   Anders Otto DASEN, MD  buPROPion  (WELLBUTRIN  XL) 300 MG 24 hr tablet Take 1 tablet (300 mg total) by mouth daily. Patient not taking: Reported on 07/24/2024 11/29/23   Anders Otto DASEN, MD  folic acid  (FOLVITE ) 800 MCG tablet Take 0.5 tablets (0.4 mg total) by mouth daily. Patient not taking: Reported on 07/24/2024 04/28/24   Anders Otto DASEN, MD  methylphenidate  (RITALIN ) 20 MG tablet Take 1 tablet (20 mg total) by mouth in the morning and 1-2 tablets (20 mg - 40 mg total) in the afternoon. 03/24/24     methylphenidate  (RITALIN ) 20 MG tablet Take 1 tablet (20 mg total) by mouth in the morning, afternoon, and evening. Patient not taking: Reported on 07/24/2024 05/24/24     methylphenidate  (RITALIN ) 20 MG tablet Take 1 tablet (20 mg total) by mouth in the morning, in the afternoon, and in the evening. Patient not taking: Reported on 07/24/2024 06/23/24     methylphenidate  (RITALIN ) 20 MG tablet Take one tablet in the morning, one to two tablets in the afternoon Patient not taking: Reported on 07/24/2024 05/24/24     methylphenidate  (RITALIN ) 20 MG tablet Take 1 tablet (20 mg) by mouth in the morning, one tablet in the afternoon, and one tablet in the evening Patient not taking: Reported on 07/24/2024 08/26/24     methylphenidate  (RITALIN ) 20 MG  tablet Take 1 tablet (20 mg total) by mouth every morning AND 1-2 tablets (20-40 mg total) daily in the afternoon. Patient not taking: No sig reported 07/21/24     methylphenidate  (RITALIN ) 20 MG tablet Take 1 tablet (20 mg total) by mouth in the morning and 1-2 tablets in the afternoon Patient not taking: Reported on 07/24/2024 09/26/24     OVER THE COUNTER MEDICATION daily at 12 noon. Carnivore supplement    [provider]  OVER THE COUNTER MEDICATION daily at 12 noon. Testerone supplement Up Red    [provider]  OVER THE  COUNTER MEDICATION daily at 12 noon. Tumeric and pepper    [provider]  pantoprazole  (PROTONIX ) 40 MG tablet Take 1 tablet (40 mg total) by mouth daily. 02/14/24   Anders Otto DASEN, MD  tirzepatide  (ZEPBOUND ) 12.5 MG/0.5ML Pen Inject 12.5 mg into the skin once a week. 07/12/24   Anders Otto DASEN, MD  torsemide  (DEMADEX ) 10 MG tablet Take 1 tablet (10 mg total) by mouth daily. Patient taking differently: Take 10 mg by mouth as needed. 07/17/24 08/16/24  Federico Norleen DASEN MADISON, MD  umeclidinium-vilanterol (ANORO ELLIPTA ) 62.5-25 MCG/ACT AEPB Inhale 1 puff into the lungs daily. 03/02/24   Anders Otto DASEN, MD    Current Outpatient Medications  Medication Sig Dispense Refill   albuterol  (VENTOLIN  HFA) 108 (90 Base) MCG/ACT inhaler Inhale 1-2 puffs into the lungs every 6 (six) hours as needed for wheezing or shortness of breath. 18 g 2   buPROPion  (WELLBUTRIN  XL) 300 MG 24 hr tablet Take 1 tablet (300 mg total) by mouth daily. (Patient not taking: Reported on 07/24/2024) 90 tablet 1   folic acid  (FOLVITE ) 800 MCG tablet Take 0.5 tablets (0.4 mg total) by mouth daily. (Patient not taking: Reported on 07/24/2024) 30 tablet PRN   methylphenidate  (RITALIN ) 20 MG tablet Take 1 tablet (20 mg total) by mouth in the morning and 1-2 tablets (20 mg - 40 mg total) in the afternoon. 90 tablet 0   methylphenidate  (RITALIN ) 20 MG tablet Take 1 tablet (20 mg total) by mouth in the morning, afternoon, and evening. (Patient not taking: Reported on 07/24/2024) 90 tablet 0   methylphenidate  (RITALIN ) 20 MG tablet Take 1 tablet (20 mg total) by mouth in the morning, in the afternoon, and in the evening. (Patient not taking: Reported on 07/24/2024) 90 tablet 0   methylphenidate  (RITALIN ) 20 MG tablet Take one tablet in the morning, one to two tablets in the afternoon (Patient not taking: Reported on 07/24/2024) 90 tablet 0   [START ON 08/26/2024] methylphenidate  (RITALIN ) 20 MG tablet Take 1 tablet (20 mg) by mouth in  the morning, one tablet in the afternoon, and one tablet in the evening (Patient not taking: Reported on 07/24/2024) 90 tablet 0   methylphenidate  (RITALIN ) 20 MG tablet Take 1 tablet (20 mg total) by mouth every morning AND 1-2 tablets (20-40 mg total) daily in the afternoon. (Patient not taking: No sig reported) 90 tablet 0   [START ON 09/26/2024] methylphenidate  (RITALIN ) 20 MG tablet Take 1 tablet (20 mg total) by mouth in the morning and 1-2 tablets in the afternoon (Patient not taking: Reported on 07/24/2024) 90 tablet 0   Na Sulfate-K Sulfate-Mg Sulfate concentrate (SUPREP) 17.5-3.13-1.6 GM/177ML SOLN Take 1 kit (354 mLs total) by mouth once for 1 dose. 354 mL 0   OVER THE COUNTER MEDICATION daily at 12 noon. Carnivore supplement     OVER THE COUNTER MEDICATION daily at  12 noon. Testerone supplement Up Red     OVER THE COUNTER MEDICATION daily at 12 noon. Tumeric and pepper     pantoprazole  (PROTONIX ) 40 MG tablet Take 1 tablet (40 mg total) by mouth daily. 90 tablet 1   tirzepatide  (ZEPBOUND ) 12.5 MG/0.5ML Pen Inject 12.5 mg into the skin once a week. 2 mL 1   torsemide  (DEMADEX ) 10 MG tablet Take 1 tablet (10 mg total) by mouth daily. (Patient taking differently: Take 10 mg by mouth as needed.) 30 tablet 0   umeclidinium-vilanterol (ANORO ELLIPTA ) 62.5-25 MCG/ACT AEPB Inhale 1 puff into the lungs daily. 60 each 4   No current facility-administered medications for this visit.    Allergies as of 08/11/2024   (No Known Allergies)    Family History  Problem Relation Age of Onset   Diabetes Paternal Grandfather    Cancer Neg Hx    Stroke Neg Hx    Breast cancer Neg Hx    Celiac disease Neg Hx    Cirrhosis Neg Hx    Clotting disorder Neg Hx    Colitis Neg Hx    Colon cancer Neg Hx    Colon polyps Neg Hx    Crohn's disease Neg Hx    Cystic fibrosis Neg Hx    Esophageal cancer Neg Hx    Heart disease Neg Hx    Hemochromatosis Neg Hx    Inflammatory bowel disease Neg Hx     Irritable bowel syndrome Neg Hx    Kidney disease Neg Hx    Liver cancer Neg Hx    Liver disease Neg Hx    Ovarian cancer Neg Hx    Pancreatic cancer Neg Hx    Prostate cancer Neg Hx    Rectal cancer Neg Hx    Stomach cancer Neg Hx    Ulcerative colitis Neg Hx    Uterine cancer Neg Hx    Wilson's disease Neg Hx     Social History   Socioeconomic History   Marital status: Single    Spouse name: Not on file   Number of children: 0   Years of education: Not on file   Highest education level: Bachelor's degree (e.g., BA, AB, BS)  Occupational History   Occupation: student-Business  Tobacco Use   Smoking status: Never   Smokeless tobacco: Never  Vaping Use   Vaping status: Never Used  Substance and Sexual Activity   Alcohol use: No   Drug use: No   Sexual activity: Not Currently  Other Topics Concern   Not on file  Social History Narrative   Lives alone. Never married, no children.  Has two sister who live in [place].  Student at work study.  [employer].  Working toward [employer].  Previously worked in geophysicist/field seismologist.  Was also previously homeless due to job loss and psychiatric exacerbation.   Social Drivers of Corporate Investment Banker Strain: High Risk (06/28/2024)   Overall Financial Resource Strain (CARDIA)    Difficulty of Paying Living Expenses: Very hard  Food Insecurity: Food Insecurity Present (06/28/2024)   Hunger Vital Sign    Worried About Running Out of Food in the Last Year: Often true    Ran Out of Food in the Last Year: Often true  Transportation Needs: No Transportation Needs (06/28/2024)   PRAPARE - Administrator, Civil Service (Medical): No    Lack of Transportation (Non-Medical): No  Recent Concern: Transportation Needs - Unmet Transportation Needs (04/18/2024)  PRAPARE - Administrator, Civil Service (Medical): No    Lack of Transportation (Non-Medical): Yes  Physical Activity: Inactive  (04/18/2024)   Exercise Vital Sign    Days of Exercise per Week: 0 days    Minutes of Exercise per Session: Not on file  Stress: Stress Concern Present (06/28/2024)   [employer] of Occupational Health - Occupational Stress Questionnaire    Feeling of Stress: Very much  Social Connections: Socially Isolated (04/18/2024)   Social Connection and Isolation Panel    Frequency of Communication with Friends and Family: Once a week    Frequency of Social Gatherings with Friends and Family: Once a week    Attends Religious Services: More than 4 times per year    Active Member of [employer] or Organizations: No    Attends Engineer, Structural: Not on file    Marital Status: Never married  Intimate Partner Violence: Not At Risk (03/31/2023)   Humiliation, Afraid, Rape, and Kick questionnaire    Fear of Current or Ex-Partner: No    Emotionally Abused: No    Physically Abused: No    Sexually Abused: No    Review of Systems:  All other review of systems negative except as mentioned in the HPI.  Physical Exam: Vital signs There were no vitals taken for this visit.  General:   Alert,  Well-developed, well-nourished, pleasant and cooperative in NAD Airway:  Mallampati  Lungs:  Clear throughout to auscultation.   Heart:  Regular rate and rhythm; no murmurs, clicks, rubs,  or gallops. Abdomen:  Soft, nontender and nondistended. Normal bowel sounds.   Neuro/Psych:  Normal mood and affect. A and O x 3  Inocente Hausen, MD [facility]

## 2024-08-10 NOTE — Telephone Encounter (Signed)
 EGD added to tomorrow's appt. Pre-certification team has been made aware

## 2024-08-11 ENCOUNTER — Encounter: Admitting: Pediatrics

## 2024-08-11 ENCOUNTER — Encounter: Payer: Self-pay | Admitting: Pediatrics

## 2024-08-11 HISTORY — DX: Morbid (severe) obesity due to excess calories: E66.01

## 2024-08-11 NOTE — Addendum Note (Signed)
 Addended by: MALINDA ORIE CROME on: 08/11/2024 08:30 AM   Modules accepted: Orders

## 2024-08-17 NOTE — Congregational Nurse Program (Signed)
 [name] came to [employer] to meet with CN after connecting with local missions dept. [name] has PCP and specialists in place with [facility] at present. He told CN that he is homeless and lives in his car. Social worker in place and he has started to work with [employer]. Bag of non-perishable food and financial assistance form given to [name] to complete and return to church.  Blood pressure and blood glucose checked during visit. BP retaken and still elevated at 137/93 at 11am. Denies discomfort, lightheadedness or H/A. Blood glucose = 111 at 11am.  Local food and shelter  community resources given. Will F/U with [facility] after he connects with local missions dept at [facility].  Tinnie Mylar, RN [employer]

## 2024-08-24 ENCOUNTER — Telehealth: Payer: Self-pay

## 2024-08-24 NOTE — Telephone Encounter (Signed)
-----   Message from Inocente Hausen, MD sent at 08/23/2024  9:38 PM EST ----- Regarding: Rescheduling EGD and colonoscopy Pod B -  Mr.Peeks is a patient of Dr. Lafonda who was scheduled for an EGD and colonoscopy with me in the last couple of weeks but he did not show.  I received a message from one of his providers stating that he would now like to reschedule his EGD and colonoscopy.  He previously had some anxiety about the procedures but is now willing to proceed.  Please contact him to reschedule EGD/colonoscopy.  He can be rescheduled with Dr. Federico since she will be back in the office soon but I am also happy to do his procedures and forward the results to Dr. Federico if I have a sooner opening.  In reviewing telephone notes related to coordination of his prior procedures it appears he lives in a shelter and there was discussion about him needing a hotel room the night before his colonoscopy for bowel prep.  Notes from October and November 2025 outlined this in further detail.  Thanks,  Inocente

## 2024-08-25 NOTE — Telephone Encounter (Signed)
 Secure email has been sent to [facility] with [facility] who previously helped set patient up for a hotel stay to see if it would be possible for patient to have a second stay.

## 2024-09-01 NOTE — Telephone Encounter (Signed)
 Per [place] with [facility] they are unable to host the patient a second time. Mychart message sent to patient.

## 2024-09-01 NOTE — Telephone Encounter (Signed)
 Reached out to [place] again for a second follow up.

## 2024-09-04 ENCOUNTER — Other Ambulatory Visit: Payer: Self-pay | Admitting: Hematology and Oncology

## 2024-09-06 ENCOUNTER — Other Ambulatory Visit (HOSPITAL_COMMUNITY): Payer: Self-pay

## 2024-09-06 ENCOUNTER — Other Ambulatory Visit: Payer: Self-pay | Admitting: Hematology and Oncology

## 2024-09-12 ENCOUNTER — Ambulatory Visit: Admitting: Family Medicine

## 2024-09-13 ENCOUNTER — Other Ambulatory Visit (HOSPITAL_COMMUNITY): Payer: Self-pay

## 2024-09-14 ENCOUNTER — Other Ambulatory Visit: Payer: Self-pay

## 2024-09-14 ENCOUNTER — Other Ambulatory Visit: Payer: Self-pay | Admitting: Family Medicine

## 2024-09-14 ENCOUNTER — Encounter (HOSPITAL_COMMUNITY): Payer: Self-pay

## 2024-09-14 ENCOUNTER — Other Ambulatory Visit (HOSPITAL_COMMUNITY): Payer: Self-pay

## 2024-09-14 ENCOUNTER — Other Ambulatory Visit: Payer: Self-pay | Admitting: Hematology and Oncology

## 2024-09-14 MED ORDER — ZEPBOUND 12.5 MG/0.5ML ~~LOC~~ SOAJ
12.5000 mg | SUBCUTANEOUS | 1 refills | Status: AC
  Filled 2024-09-14: qty 2, 28d supply, fill #0
  Filled 2024-10-12: qty 2, 28d supply, fill #1

## 2024-09-15 ENCOUNTER — Other Ambulatory Visit (HOSPITAL_COMMUNITY): Payer: Self-pay

## 2024-09-18 ENCOUNTER — Other Ambulatory Visit: Payer: Self-pay | Admitting: Hematology and Oncology

## 2024-09-18 ENCOUNTER — Other Ambulatory Visit (HOSPITAL_COMMUNITY): Payer: Self-pay

## 2024-09-19 ENCOUNTER — Other Ambulatory Visit: Payer: Self-pay | Admitting: Hematology and Oncology

## 2024-09-19 DIAGNOSIS — D500 Iron deficiency anemia secondary to blood loss (chronic): Secondary | ICD-10-CM

## 2024-09-20 ENCOUNTER — Inpatient Hospital Stay: Attending: Hematology and Oncology

## 2024-09-20 ENCOUNTER — Inpatient Hospital Stay: Admitting: Physician Assistant

## 2024-09-21 ENCOUNTER — Other Ambulatory Visit (HOSPITAL_COMMUNITY): Payer: Self-pay

## 2024-09-22 ENCOUNTER — Other Ambulatory Visit: Payer: Self-pay | Admitting: Hematology and Oncology

## 2024-09-25 ENCOUNTER — Other Ambulatory Visit: Payer: Self-pay | Admitting: Hematology and Oncology

## 2024-09-25 ENCOUNTER — Other Ambulatory Visit (HOSPITAL_COMMUNITY): Payer: Self-pay

## 2024-09-28 NOTE — Telephone Encounter (Signed)
 Left detailed message for patient to call back when he is ready to schedule his procedure. Reached out by mychart twice as well.

## 2024-09-29 ENCOUNTER — Other Ambulatory Visit (HOSPITAL_COMMUNITY): Payer: Self-pay

## 2024-10-02 ENCOUNTER — Other Ambulatory Visit: Payer: Self-pay | Admitting: Hematology and Oncology

## 2024-10-09 ENCOUNTER — Other Ambulatory Visit (HOSPITAL_COMMUNITY): Payer: Self-pay
